# Patient Record
Sex: Female | Born: 1935 | Race: White | Hispanic: No | Marital: Married | Smoking: Former smoker
Health system: Southern US, Community
[De-identification: ages and names within clinical notes are randomized; demographics above are authoritative.]

## PROBLEM LIST (undated history)

## (undated) DIAGNOSIS — M1990 Unspecified osteoarthritis, unspecified site: Secondary | ICD-10-CM

## (undated) DIAGNOSIS — I5032 Chronic diastolic (congestive) heart failure: Secondary | ICD-10-CM

## (undated) DIAGNOSIS — I639 Cerebral infarction, unspecified: Secondary | ICD-10-CM

## (undated) DIAGNOSIS — Z9989 Dependence on other enabling machines and devices: Secondary | ICD-10-CM

## (undated) DIAGNOSIS — E785 Hyperlipidemia, unspecified: Secondary | ICD-10-CM

## (undated) DIAGNOSIS — M797 Fibromyalgia: Secondary | ICD-10-CM

## (undated) DIAGNOSIS — K219 Gastro-esophageal reflux disease without esophagitis: Secondary | ICD-10-CM

## (undated) DIAGNOSIS — T4145XA Adverse effect of unspecified anesthetic, initial encounter: Secondary | ICD-10-CM

## (undated) DIAGNOSIS — I209 Angina pectoris, unspecified: Secondary | ICD-10-CM

## (undated) DIAGNOSIS — K5790 Diverticulosis of intestine, part unspecified, without perforation or abscess without bleeding: Secondary | ICD-10-CM

## (undated) DIAGNOSIS — N3941 Urge incontinence: Secondary | ICD-10-CM

## (undated) DIAGNOSIS — G4733 Obstructive sleep apnea (adult) (pediatric): Secondary | ICD-10-CM

## (undated) DIAGNOSIS — I350 Nonrheumatic aortic (valve) stenosis: Secondary | ICD-10-CM

## (undated) DIAGNOSIS — Z853 Personal history of malignant neoplasm of breast: Secondary | ICD-10-CM

## (undated) DIAGNOSIS — I499 Cardiac arrhythmia, unspecified: Secondary | ICD-10-CM

## (undated) DIAGNOSIS — Z8673 Personal history of transient ischemic attack (TIA), and cerebral infarction without residual deficits: Secondary | ICD-10-CM

## (undated) DIAGNOSIS — T8859XA Other complications of anesthesia, initial encounter: Secondary | ICD-10-CM

## (undated) DIAGNOSIS — C801 Malignant (primary) neoplasm, unspecified: Secondary | ICD-10-CM

## (undated) DIAGNOSIS — H9190 Unspecified hearing loss, unspecified ear: Secondary | ICD-10-CM

## (undated) DIAGNOSIS — I4821 Permanent atrial fibrillation: Secondary | ICD-10-CM

## (undated) DIAGNOSIS — I2510 Atherosclerotic heart disease of native coronary artery without angina pectoris: Secondary | ICD-10-CM

## (undated) DIAGNOSIS — R351 Nocturia: Secondary | ICD-10-CM

## (undated) DIAGNOSIS — I10 Essential (primary) hypertension: Secondary | ICD-10-CM

## (undated) DIAGNOSIS — Z973 Presence of spectacles and contact lenses: Secondary | ICD-10-CM

## (undated) DIAGNOSIS — I272 Pulmonary hypertension, unspecified: Secondary | ICD-10-CM

## (undated) HISTORY — DX: Pulmonary hypertension, unspecified: I27.20

## (undated) HISTORY — PX: MASTECTOMY: SHX3

## (undated) HISTORY — DX: Unspecified osteoarthritis, unspecified site: M19.90

## (undated) HISTORY — DX: Nonrheumatic aortic (valve) stenosis: I35.0

## (undated) HISTORY — DX: Permanent atrial fibrillation: I48.21

---

## 1963-10-19 HISTORY — PX: APPENDECTOMY: SHX54

## 1971-10-19 HISTORY — PX: TUBAL LIGATION: SHX77

## 1985-10-18 HISTORY — PX: ABDOMINOPLASTY: SUR9

## 2002-10-18 HISTORY — PX: BREAST SURGERY: SHX581

## 2003-08-02 ENCOUNTER — Encounter: Admission: RE | Admit: 2003-08-02 | Discharge: 2003-08-02 | Payer: Self-pay | Admitting: Family Medicine

## 2003-08-02 ENCOUNTER — Encounter: Payer: Self-pay | Admitting: Family Medicine

## 2003-08-02 ENCOUNTER — Encounter (INDEPENDENT_AMBULATORY_CARE_PROVIDER_SITE_OTHER): Payer: Self-pay | Admitting: Specialist

## 2003-08-02 ENCOUNTER — Encounter (INDEPENDENT_AMBULATORY_CARE_PROVIDER_SITE_OTHER): Payer: Self-pay | Admitting: Family Medicine

## 2003-08-07 ENCOUNTER — Ambulatory Visit (HOSPITAL_COMMUNITY): Admission: RE | Admit: 2003-08-07 | Discharge: 2003-08-07 | Payer: Self-pay | Admitting: Family Medicine

## 2003-08-07 ENCOUNTER — Encounter: Payer: Self-pay | Admitting: Family Medicine

## 2003-08-19 ENCOUNTER — Encounter (HOSPITAL_COMMUNITY): Admission: RE | Admit: 2003-08-19 | Discharge: 2003-11-17 | Payer: Self-pay | Admitting: Surgery

## 2003-09-13 ENCOUNTER — Encounter: Admission: RE | Admit: 2003-09-13 | Discharge: 2003-09-13 | Payer: Self-pay | Admitting: Surgery

## 2003-09-16 ENCOUNTER — Encounter (INDEPENDENT_AMBULATORY_CARE_PROVIDER_SITE_OTHER): Payer: Self-pay | Admitting: *Deleted

## 2003-09-16 ENCOUNTER — Ambulatory Visit (HOSPITAL_BASED_OUTPATIENT_CLINIC_OR_DEPARTMENT_OTHER): Admission: RE | Admit: 2003-09-16 | Discharge: 2003-09-16 | Payer: Self-pay | Admitting: Surgery

## 2003-09-16 ENCOUNTER — Ambulatory Visit (HOSPITAL_COMMUNITY): Admission: RE | Admit: 2003-09-16 | Discharge: 2003-09-16 | Payer: Self-pay | Admitting: Surgery

## 2003-09-16 HISTORY — PX: MASTECTOMY, MODIFIED RADICAL W/RECONSTRUCTION: SHX708

## 2003-11-22 ENCOUNTER — Ambulatory Visit (HOSPITAL_COMMUNITY): Admission: RE | Admit: 2003-11-22 | Discharge: 2003-11-22 | Payer: Self-pay | Admitting: Family Medicine

## 2003-11-26 ENCOUNTER — Ambulatory Visit (HOSPITAL_COMMUNITY): Admission: RE | Admit: 2003-11-26 | Discharge: 2003-11-26 | Payer: Self-pay | Admitting: Oncology

## 2004-03-13 ENCOUNTER — Ambulatory Visit (HOSPITAL_COMMUNITY): Admission: RE | Admit: 2004-03-13 | Discharge: 2004-03-13 | Payer: Self-pay | Admitting: Family Medicine

## 2004-07-08 ENCOUNTER — Ambulatory Visit (HOSPITAL_BASED_OUTPATIENT_CLINIC_OR_DEPARTMENT_OTHER): Admission: RE | Admit: 2004-07-08 | Discharge: 2004-07-08 | Payer: Self-pay | Admitting: Orthopedic Surgery

## 2004-07-08 ENCOUNTER — Ambulatory Visit (HOSPITAL_COMMUNITY): Admission: RE | Admit: 2004-07-08 | Discharge: 2004-07-08 | Payer: Self-pay | Admitting: Orthopedic Surgery

## 2004-07-08 HISTORY — PX: CARPAL TUNNEL RELEASE: SHX101

## 2004-08-12 ENCOUNTER — Ambulatory Visit (HOSPITAL_COMMUNITY): Admission: RE | Admit: 2004-08-12 | Discharge: 2004-08-12 | Payer: Self-pay | Admitting: Oncology

## 2004-08-31 ENCOUNTER — Ambulatory Visit: Payer: Self-pay | Admitting: Oncology

## 2004-10-18 HISTORY — PX: COLONOSCOPY WITH ESOPHAGOGASTRODUODENOSCOPY (EGD): SHX5779

## 2004-11-11 ENCOUNTER — Ambulatory Visit (HOSPITAL_BASED_OUTPATIENT_CLINIC_OR_DEPARTMENT_OTHER): Admission: RE | Admit: 2004-11-11 | Discharge: 2004-11-11 | Payer: Self-pay | Admitting: Plastic Surgery

## 2004-11-11 HISTORY — PX: OTHER SURGICAL HISTORY: SHX169

## 2004-12-03 ENCOUNTER — Ambulatory Visit (HOSPITAL_COMMUNITY): Admission: RE | Admit: 2004-12-03 | Discharge: 2004-12-03 | Payer: Self-pay | Admitting: Plastic Surgery

## 2004-12-03 ENCOUNTER — Ambulatory Visit (HOSPITAL_BASED_OUTPATIENT_CLINIC_OR_DEPARTMENT_OTHER): Admission: RE | Admit: 2004-12-03 | Discharge: 2004-12-03 | Payer: Self-pay | Admitting: Plastic Surgery

## 2004-12-25 ENCOUNTER — Ambulatory Visit: Payer: Self-pay | Admitting: Oncology

## 2005-01-05 ENCOUNTER — Ambulatory Visit (HOSPITAL_COMMUNITY): Admission: RE | Admit: 2005-01-05 | Discharge: 2005-01-05 | Payer: Self-pay | Admitting: Gastroenterology

## 2005-03-16 ENCOUNTER — Ambulatory Visit (HOSPITAL_COMMUNITY): Admission: RE | Admit: 2005-03-16 | Discharge: 2005-03-16 | Payer: Self-pay | Admitting: Plastic Surgery

## 2005-04-21 ENCOUNTER — Ambulatory Visit: Payer: Self-pay | Admitting: Oncology

## 2005-08-27 ENCOUNTER — Ambulatory Visit (HOSPITAL_COMMUNITY): Admission: RE | Admit: 2005-08-27 | Discharge: 2005-08-27 | Payer: Self-pay | Admitting: Oncology

## 2005-09-10 ENCOUNTER — Ambulatory Visit: Payer: Self-pay | Admitting: Oncology

## 2005-09-13 ENCOUNTER — Ambulatory Visit (HOSPITAL_COMMUNITY): Admission: RE | Admit: 2005-09-13 | Discharge: 2005-09-13 | Payer: Self-pay | Admitting: Oncology

## 2005-09-15 ENCOUNTER — Ambulatory Visit (HOSPITAL_COMMUNITY): Admission: RE | Admit: 2005-09-15 | Discharge: 2005-09-15 | Payer: Self-pay | Admitting: Plastic Surgery

## 2005-09-15 ENCOUNTER — Encounter (INDEPENDENT_AMBULATORY_CARE_PROVIDER_SITE_OTHER): Payer: Self-pay | Admitting: Specialist

## 2005-09-15 HISTORY — PX: OTHER SURGICAL HISTORY: SHX169

## 2006-04-07 ENCOUNTER — Ambulatory Visit: Payer: Self-pay | Admitting: Oncology

## 2006-04-11 LAB — CBC WITH DIFFERENTIAL/PLATELET
BASO%: 0.5 % (ref 0.0–2.0)
Eosinophils Absolute: 0.3 10*3/uL (ref 0.0–0.5)
HCT: 38.3 % (ref 34.8–46.6)
HGB: 13.5 g/dL (ref 11.6–15.9)
LYMPH%: 31.8 % (ref 14.0–48.0)
MCHC: 35.2 g/dL (ref 32.0–36.0)
MONO#: 0.5 10*3/uL (ref 0.1–0.9)
NEUT#: 2.7 10*3/uL (ref 1.5–6.5)
NEUT%: 52.4 % (ref 39.6–76.8)
Platelets: 222 10*3/uL (ref 145–400)
WBC: 5.1 10*3/uL (ref 3.9–10.0)
lymph#: 1.6 10*3/uL (ref 0.9–3.3)

## 2006-04-11 LAB — COMPREHENSIVE METABOLIC PANEL
ALT: 23 U/L (ref 0–40)
CO2: 28 mEq/L (ref 19–32)
Calcium: 9.3 mg/dL (ref 8.4–10.5)
Chloride: 106 mEq/L (ref 96–112)
Creatinine, Ser: 1.09 mg/dL (ref 0.40–1.20)
Glucose, Bld: 95 mg/dL (ref 70–99)
Sodium: 142 mEq/L (ref 135–145)
Total Bilirubin: 0.5 mg/dL (ref 0.3–1.2)
Total Protein: 7 g/dL (ref 6.0–8.3)

## 2006-04-11 LAB — CANCER ANTIGEN 27.29: CA 27.29: 52 U/mL — ABNORMAL HIGH (ref 0–39)

## 2006-08-05 ENCOUNTER — Ambulatory Visit (HOSPITAL_COMMUNITY): Admission: RE | Admit: 2006-08-05 | Discharge: 2006-08-05 | Payer: Self-pay | Admitting: Plastic Surgery

## 2006-08-05 ENCOUNTER — Encounter (INDEPENDENT_AMBULATORY_CARE_PROVIDER_SITE_OTHER): Payer: Self-pay | Admitting: Specialist

## 2006-08-05 HISTORY — PX: OTHER SURGICAL HISTORY: SHX169

## 2006-08-29 ENCOUNTER — Ambulatory Visit (HOSPITAL_COMMUNITY): Admission: RE | Admit: 2006-08-29 | Discharge: 2006-08-29 | Payer: Self-pay | Admitting: Family Medicine

## 2006-09-02 ENCOUNTER — Ambulatory Visit (HOSPITAL_COMMUNITY): Admission: RE | Admit: 2006-09-02 | Discharge: 2006-09-02 | Payer: Self-pay | Admitting: Family Medicine

## 2006-09-02 ENCOUNTER — Encounter: Admission: RE | Admit: 2006-09-02 | Discharge: 2006-09-02 | Payer: Self-pay | Admitting: Plastic Surgery

## 2006-10-24 ENCOUNTER — Ambulatory Visit: Payer: Self-pay | Admitting: Oncology

## 2006-10-26 LAB — COMPREHENSIVE METABOLIC PANEL
ALT: 23 U/L (ref 0–35)
AST: 18 U/L (ref 0–37)
Albumin: 4.2 g/dL (ref 3.5–5.2)
CO2: 26 mEq/L (ref 19–32)
Calcium: 9.7 mg/dL (ref 8.4–10.5)
Chloride: 104 mEq/L (ref 96–112)
Potassium: 3.9 mEq/L (ref 3.5–5.3)
Sodium: 138 mEq/L (ref 135–145)
Total Protein: 7.6 g/dL (ref 6.0–8.3)

## 2006-10-26 LAB — CBC WITH DIFFERENTIAL/PLATELET
BASO%: 0.5 % (ref 0.0–2.0)
EOS%: 4.4 % (ref 0.0–7.0)
HCT: 38.8 % (ref 34.8–46.6)
MCH: 30.5 pg (ref 26.0–34.0)
MCHC: 34 g/dL (ref 32.0–36.0)
MONO#: 0.6 10*3/uL (ref 0.1–0.9)
RDW: 13.7 % (ref 11.3–14.5)
WBC: 7.9 10*3/uL (ref 3.9–10.0)
lymph#: 1.5 10*3/uL (ref 0.9–3.3)

## 2007-03-05 ENCOUNTER — Encounter: Admission: RE | Admit: 2007-03-05 | Discharge: 2007-03-05 | Payer: Self-pay | Admitting: Oncology

## 2007-04-18 ENCOUNTER — Ambulatory Visit: Payer: Self-pay | Admitting: Oncology

## 2007-04-20 LAB — COMPREHENSIVE METABOLIC PANEL
Alkaline Phosphatase: 85 U/L (ref 39–117)
CO2: 27 mEq/L (ref 19–32)
Creatinine, Ser: 0.97 mg/dL (ref 0.40–1.20)
Glucose, Bld: 98 mg/dL (ref 70–99)
Total Bilirubin: 0.5 mg/dL (ref 0.3–1.2)

## 2007-04-20 LAB — CBC WITH DIFFERENTIAL/PLATELET
BASO%: 0.6 % (ref 0.0–2.0)
Eosinophils Absolute: 0.5 10*3/uL (ref 0.0–0.5)
HCT: 39.9 % (ref 34.8–46.6)
LYMPH%: 33.9 % (ref 14.0–48.0)
MCHC: 35.7 g/dL (ref 32.0–36.0)
MCV: 89.4 fL (ref 81.0–101.0)
MONO#: 0.6 10*3/uL (ref 0.1–0.9)
MONO%: 9.4 % (ref 0.0–13.0)
NEUT%: 47.3 % (ref 39.6–76.8)
Platelets: 222 10*3/uL (ref 145–400)
RBC: 4.46 10*6/uL (ref 3.70–5.32)
WBC: 5.9 10*3/uL (ref 3.9–10.0)

## 2007-04-20 LAB — LACTATE DEHYDROGENASE: LDH: 191 U/L (ref 94–250)

## 2007-04-20 LAB — CANCER ANTIGEN 27.29: CA 27.29: 35 U/mL (ref 0–39)

## 2007-11-01 ENCOUNTER — Ambulatory Visit: Payer: Self-pay | Admitting: Oncology

## 2007-11-06 LAB — CBC WITH DIFFERENTIAL/PLATELET
Basophils Absolute: 0 10*3/uL (ref 0.0–0.1)
Eosinophils Absolute: 0.3 10*3/uL (ref 0.0–0.5)
HCT: 39.8 % (ref 34.8–46.6)
HGB: 14.3 g/dL (ref 11.6–15.9)
LYMPH%: 32 % (ref 14.0–48.0)
MONO#: 0.4 10*3/uL (ref 0.1–0.9)
NEUT#: 3.3 10*3/uL (ref 1.5–6.5)
NEUT%: 55.4 % (ref 39.6–76.8)
Platelets: 224 10*3/uL (ref 145–400)
RBC: 4.29 10*6/uL (ref 3.70–5.32)
WBC: 6 10*3/uL (ref 3.9–10.0)

## 2007-11-06 LAB — CANCER ANTIGEN 27.29: CA 27.29: 41 U/mL — ABNORMAL HIGH (ref 0–39)

## 2007-11-06 LAB — COMPREHENSIVE METABOLIC PANEL
BUN: 30 mg/dL — ABNORMAL HIGH (ref 6–23)
CO2: 23 mEq/L (ref 19–32)
Glucose, Bld: 109 mg/dL — ABNORMAL HIGH (ref 70–99)
Sodium: 140 mEq/L (ref 135–145)
Total Bilirubin: 0.6 mg/dL (ref 0.3–1.2)
Total Protein: 7.4 g/dL (ref 6.0–8.3)

## 2007-11-06 LAB — LACTATE DEHYDROGENASE: LDH: 167 U/L (ref 94–250)

## 2008-02-20 ENCOUNTER — Ambulatory Visit (HOSPITAL_COMMUNITY): Admission: RE | Admit: 2008-02-20 | Discharge: 2008-02-20 | Payer: Self-pay | Admitting: Oncology

## 2008-07-01 ENCOUNTER — Ambulatory Visit: Payer: Self-pay | Admitting: Oncology

## 2008-07-03 LAB — CBC WITH DIFFERENTIAL/PLATELET
BASO%: 0.6 % (ref 0.0–2.0)
EOS%: 2.5 % (ref 0.0–7.0)
HCT: 36.5 % (ref 34.8–46.6)
MCHC: 35.8 g/dL (ref 32.0–36.0)
MONO#: 0.4 10*3/uL (ref 0.1–0.9)
NEUT%: 61.8 % (ref 39.6–76.8)
RDW: 12.3 % (ref 11.3–14.5)
WBC: 6.8 10*3/uL (ref 3.9–10.0)
lymph#: 1.9 10*3/uL (ref 0.9–3.3)

## 2008-07-04 LAB — COMPREHENSIVE METABOLIC PANEL
ALT: 33 U/L (ref 0–35)
AST: 26 U/L (ref 0–37)
Albumin: 4.2 g/dL (ref 3.5–5.2)
CO2: 24 mEq/L (ref 19–32)
Calcium: 9.9 mg/dL (ref 8.4–10.5)
Chloride: 101 mEq/L (ref 96–112)
Creatinine, Ser: 1.36 mg/dL — ABNORMAL HIGH (ref 0.40–1.20)
Potassium: 3.9 mEq/L (ref 3.5–5.3)
Sodium: 140 mEq/L (ref 135–145)
Total Protein: 7.4 g/dL (ref 6.0–8.3)

## 2008-07-04 LAB — LACTATE DEHYDROGENASE: LDH: 182 U/L (ref 94–250)

## 2008-07-28 LAB — ESTRADIOL, ULTRA SENS

## 2008-10-01 ENCOUNTER — Ambulatory Visit (HOSPITAL_COMMUNITY): Admission: RE | Admit: 2008-10-01 | Discharge: 2008-10-01 | Payer: Self-pay | Admitting: Oncology

## 2009-02-04 ENCOUNTER — Ambulatory Visit: Payer: Self-pay | Admitting: Oncology

## 2009-02-11 LAB — CBC WITH DIFFERENTIAL/PLATELET
Basophils Absolute: 0 10*3/uL (ref 0.0–0.1)
EOS%: 5.3 % (ref 0.0–7.0)
HCT: 38.8 % (ref 34.8–46.6)
HGB: 13.5 g/dL (ref 11.6–15.9)
MCH: 33 pg (ref 25.1–34.0)
MCV: 94.6 fL (ref 79.5–101.0)
MONO%: 6.4 % (ref 0.0–14.0)
NEUT%: 64.1 % (ref 38.4–76.8)

## 2009-02-12 LAB — COMPREHENSIVE METABOLIC PANEL
AST: 17 U/L (ref 0–37)
Alkaline Phosphatase: 57 U/L (ref 39–117)
BUN: 31 mg/dL — ABNORMAL HIGH (ref 6–23)
Calcium: 9.7 mg/dL (ref 8.4–10.5)
Chloride: 105 mEq/L (ref 96–112)
Creatinine, Ser: 1.13 mg/dL (ref 0.40–1.20)
Glucose, Bld: 95 mg/dL (ref 70–99)

## 2009-02-12 LAB — VITAMIN D 25 HYDROXY (VIT D DEFICIENCY, FRACTURES): Vit D, 25-Hydroxy: 48 ng/mL (ref 30–89)

## 2009-02-12 LAB — LACTATE DEHYDROGENASE: LDH: 165 U/L (ref 94–250)

## 2009-02-21 ENCOUNTER — Ambulatory Visit (HOSPITAL_COMMUNITY): Admission: RE | Admit: 2009-02-21 | Discharge: 2009-02-21 | Payer: Self-pay | Admitting: Oncology

## 2010-02-11 ENCOUNTER — Ambulatory Visit: Payer: Self-pay | Admitting: Oncology

## 2010-02-12 LAB — CBC WITH DIFFERENTIAL/PLATELET
BASO%: 0.4 % (ref 0.0–2.0)
Basophils Absolute: 0 10*3/uL (ref 0.0–0.1)
EOS%: 5.1 % (ref 0.0–7.0)
Eosinophils Absolute: 0.2 10*3/uL (ref 0.0–0.5)
HCT: 42.6 % (ref 34.8–46.6)
HGB: 14.9 g/dL (ref 11.6–15.9)
LYMPH%: 35.2 % (ref 14.0–49.7)
MCH: 33.2 pg (ref 25.1–34.0)
MCHC: 35 g/dL (ref 31.5–36.0)
MCV: 95 fL (ref 79.5–101.0)
MONO#: 0.4 10*3/uL (ref 0.1–0.9)
MONO%: 8.6 % (ref 0.0–14.0)
NEUT#: 2.2 10*3/uL (ref 1.5–6.5)
NEUT%: 50.7 % (ref 38.4–76.8)
Platelets: 210 10*3/uL (ref 145–400)
RBC: 4.49 10*6/uL (ref 3.70–5.45)
RDW: 12.9 % (ref 11.2–14.5)
WBC: 4.4 10*3/uL (ref 3.9–10.3)
lymph#: 1.6 10*3/uL (ref 0.9–3.3)

## 2010-02-13 LAB — COMPREHENSIVE METABOLIC PANEL
ALT: 18 U/L (ref 0–35)
AST: 19 U/L (ref 0–37)
Albumin: 4.4 g/dL (ref 3.5–5.2)
Alkaline Phosphatase: 77 U/L (ref 39–117)
BUN: 34 mg/dL — ABNORMAL HIGH (ref 6–23)
CO2: 26 mEq/L (ref 19–32)
Calcium: 9.4 mg/dL (ref 8.4–10.5)
Chloride: 101 mEq/L (ref 96–112)
Creatinine, Ser: 1.12 mg/dL (ref 0.40–1.20)
Glucose, Bld: 106 mg/dL — ABNORMAL HIGH (ref 70–99)
Potassium: 4.3 mEq/L (ref 3.5–5.3)
Sodium: 141 mEq/L (ref 135–145)
Total Bilirubin: 0.7 mg/dL (ref 0.3–1.2)
Total Protein: 7.3 g/dL (ref 6.0–8.3)

## 2010-02-13 LAB — CANCER ANTIGEN 27.29: CA 27.29: 43 U/mL — ABNORMAL HIGH (ref 0–39)

## 2010-02-13 LAB — LACTATE DEHYDROGENASE: LDH: 154 U/L (ref 94–250)

## 2010-02-13 LAB — VITAMIN D 25 HYDROXY (VIT D DEFICIENCY, FRACTURES): Vit D, 25-Hydroxy: 32 ng/mL (ref 30–89)

## 2010-02-23 ENCOUNTER — Ambulatory Visit (HOSPITAL_COMMUNITY): Admission: RE | Admit: 2010-02-23 | Discharge: 2010-02-23 | Payer: Self-pay | Admitting: Oncology

## 2010-11-07 ENCOUNTER — Encounter: Payer: Self-pay | Admitting: Oncology

## 2010-11-08 ENCOUNTER — Encounter: Payer: Self-pay | Admitting: Oncology

## 2010-11-25 ENCOUNTER — Other Ambulatory Visit: Payer: Self-pay | Admitting: Family Medicine

## 2010-11-25 ENCOUNTER — Ambulatory Visit
Admission: RE | Admit: 2010-11-25 | Discharge: 2010-11-25 | Disposition: A | Discharge status: Home / Self Care | Payer: BC Managed Care – PPO | Source: Ambulatory Visit | Attending: Family Medicine | Admitting: Family Medicine

## 2010-11-25 DIAGNOSIS — IMO0002 Reserved for concepts with insufficient information to code with codable children: Secondary | ICD-10-CM

## 2011-02-10 ENCOUNTER — Other Ambulatory Visit (HOSPITAL_COMMUNITY): Payer: Self-pay | Admitting: Family Medicine

## 2011-02-10 DIAGNOSIS — Z1231 Encounter for screening mammogram for malignant neoplasm of breast: Secondary | ICD-10-CM

## 2011-02-10 DIAGNOSIS — Z853 Personal history of malignant neoplasm of breast: Secondary | ICD-10-CM

## 2011-02-26 ENCOUNTER — Ambulatory Visit (HOSPITAL_COMMUNITY)
Admission: RE | Admit: 2011-02-26 | Discharge: 2011-02-26 | Disposition: A | Discharge status: Home / Self Care | Payer: BC Managed Care – PPO | Source: Ambulatory Visit | Attending: Family Medicine | Admitting: Family Medicine

## 2011-02-26 DIAGNOSIS — Z853 Personal history of malignant neoplasm of breast: Secondary | ICD-10-CM

## 2011-02-26 DIAGNOSIS — Z1231 Encounter for screening mammogram for malignant neoplasm of breast: Secondary | ICD-10-CM | POA: Insufficient documentation

## 2011-03-05 NOTE — Op Note (Signed)
NAME:  [name], [name]                      ACCOUNT NO.:  [id number]   MEDICAL RECORD NO.:  [id number]                   PATIENT TYPE:  AMB   LOCATION:  DSC                                  FACILITY:  [facility]   PHYSICIAN:  Etter Sjogren, M.D.                  DATE OF BIRTH:  1936-08-09   DATE OF PROCEDURE:  09/16/2003  DATE OF DISCHARGE:                                 OPERATIVE REPORT   PREOPERATIVE DIAGNOSIS:  Left breast cancer.   POSTOPERATIVE DIAGNOSIS:  Left breast cancer.   PROCEDURE:  Left breast reconstruction immediate with a tissue expander.   SURGEON:  Etter Sjogren, M.D.   ANESTHESIA:  General.   ESTIMATED BLOOD LOSS:  Minimal.   DRAINS:  2 Blakes.   INDICATIONS FOR PROCEDURE:  The patient is a 75 year old woman who has left  breast cancer. She is to undergo a mastectomy  and a sentinel lymph node  biopsy. She was interested in reconstruction and the option was discussed  and she elected to have a postoperative tissue expander followed  eventually  by an implant. The nature of the procedure and the risks were discussed  with her in great detail and she understood the risks and possible  complications and wished to proceed.   DESCRIPTION OF PROCEDURE:  The patient was in the operating room and the  mastectomy had been completed. The sentinel lymph node was negative. She  tolerated  that well.   The dissection was carried deep to the pectoralis major muscle using a  muscle splitting incision. The submuscular pocket well developed for the  placement of the implant done inferiorly to the level of the inframammary  crease. There was thorough irrigation with antibiotic solution after first  assuring excellent hemostasis using electrocautery.   The implant was then prepared. The operating surgeon thoroughly cleaned the  gloves. This was a Engineer, petroleum medium height contour profile tissue  expander lot #[id number] 150 mL. The air was removed and 50 mL of sterile  saline was placed using the closed system. All the air was again removed and  the expander was positioned in the submuscular pocket, making sure that it  was seated at the very inferior aspect of the pocket.   Again irrigation with antibiotic solution. Good hemostasis was again  confirmed. The muscle layer was closed with 3-0 Vicryl interrupted figure-of-  8 sutures. Blake drains were positioned and brought out through separate  stab wounds inferiorly and secured with 3-0 Prolene suture. One of these was  left along the lateral aspect and into the superior  and one inferiorly.   The same closure, 3-0 Monocryl interrupted for the deep sutures and running  subcuticular suture. Steri-Strips and a dry sterile dressing with a light  circumferential  Ace wrap were applied. She was transferred to the recovery  room in stable condition having tolerated  the procedure well.  Etter Sjogren, M.D.    DB/MEDQ  D:  09/16/2003  T:  09/16/2003  Job:  [id number]

## 2011-03-05 NOTE — Op Note (Signed)
NAME:  [name], [name] NO.:  [id number]   MEDICAL RECORD NO.:  [id number]          PATIENT TYPE:  AMB   LOCATION:  SDS                          FACILITY:  [facility]   PHYSICIAN:  Etter Sjogren, M.D.     DATE OF BIRTH:  [birth year]   DATE OF PROCEDURE:  08/05/2006  DATE OF DISCHARGE:                                 OPERATIVE REPORT   PREOPERATIVE DIAGNOSIS:  Chest cellulitis, status post breast  reconstruction; possible infection of tissue expander with capsule  contracture.   POSTOPERATIVE DIAGNOSIS:  Chest cellulitis, status post breast  reconstruction; possible infection of tissue expander with capsule  contracture.   PROCEDURE PERFORMED:  1. Removal of tissue expander.  2. Total capsulectomy.  3. Replacement of tissue expander.   SURGEON:  Etter Sjogren, M.D.   ASSISTANT:  Cox, RNFA.   ESTIMATED BLOOD LOSS:  100 mL.   DRAINS:  One 19-French Harrison Mons drain was left.   CLINICAL NOTE:  This 75 year old woman has had a breast reconstruction.  She  has had problems with infection in the past.  Her reconstruction was  repeated about six weeks ago, and she has subsequently developed cellulitis.  It was felt that she needs a total capsulectomy and there is a chance that  this reconstruction could be salvaged.  We discussed that at length with  her.  She understands that it might fail, and she might have a repeat  infection, but that this is a very indolent-appearing process and with a  normal white blood cell count, and we might be able to salvage it with a  total capsulectomy, thorough irrigation, and replacement of the tissue  expander.  She wishes to proceed.  Other risks and possible complications  are well-known to her, including but not limited to, bleeding, infection,  surgery complications, healing problems, capsular contracture, scarring.   DESCRIPTION OF PROCEDURE:  The patient was taken to the operating room and  placed supine.  After successful induction  of general anesthesia, she was  prepped with Betadine and draped with sterile drapes.  The old mastectomy  scar was excised and the dissection carried down through the underlying  muscle down to the tissue expander.  Aerobic and anaerobic cultures were  taken.  The tissue expander was removed.  A total capsulectomy was  performed, taking great care to avoid damage to overlying pectoralis major  muscle and the underlying chest cavity.  After the total capsulectomy,  hemostasis was obtained with electrocautery.  Thorough irrigation with  saline, a total of 4 liters, 3 liters of which were irrigated with the pulse  lavage system.  A 19-French Blake drain was positioned and brought out  through a separate stab wound inferolaterally and secured with 3-0 plain  suture.  The tissue expander was soaked in antibiotic solution for greater  than 5 minutes.  This was a [employer] tissue expander, lot [id number], a 650 mL  tissue expander and 85 mL sterile saline placed using a closed filling  system, and all of the air was removed.  The pocket appeared to have  excellent hemostasis.  The tissue expander  was positioned, antibiotic  solution placed in the submuscular pocket and allowed to dwell.  The closure  with 2-0 Monocryl interrupted horizontal mattress sutures and the skin with  3-0 nylon interrupted vertical mattress sutures.  Xeroform gauze and dry,  sterile dressing and Steri-Strips were applied, and she was transferred to  the recovery room in stable condition, having tolerated the procedure well.   DISPOSITION:  She will remain on antibiotics, and we will see her back in  the office in a few days.      Etter Sjogren, M.D.  Electronically Signed     DB/MEDQ  D:  08/05/2006  T:  08/06/2006  Job:  [id number]

## 2011-03-05 NOTE — Op Note (Signed)
NAME:  [name], [name] NO.:  [id number]   MEDICAL RECORD NO.:  [id number]          PATIENT TYPE:  OIB   LOCATION:  2855                         FACILITY:  [facility]   PHYSICIAN:  Etter Sjogren, M.D.     DATE OF BIRTH:  March 25, 1936   DATE OF PROCEDURE:  03/16/2005  DATE OF DISCHARGE:  03/16/2005                                 OPERATIVE REPORT   PREOPERATIVE DIAGNOSIS:  Seroma of the left breast with displacement of  implant inferiorly, status post mastectomy for breast cancer.   POSTOPERATIVE DIAGNOSIS:  Seroma of the left breast with displacement of  implant inferiorly, status post mastectomy for breast cancer.   PROCEDURE PERFORMED:  Revision of breast reconstruction for breast cancer  (drainage of seroma, inferior capsulorrhaphy, superior capsulotomy and  exchange of an implant.   SURGEON:  Etter Sjogren, M.D.   ANESTHESIA:  General.   ESTIMATED BLOOD LOSS:  Minimal.   DRAINS:  One Blake left.   CLINICAL NOTE:  This is a 75 year old woman who has had breast  reconstruction.  She has had a previous seroma around this implant and had  that drained and the implant replaced.  She did well for a number months,  but now has reaccumulated a seroma.  The capsule was also opened inferiorly  as well, contributing to the collection of the seroma.  The nature of the  procedure and risks were discussed with her.  She understood we would try to  make an attempt to do a capsulorrhaphy if it was possible, but certainly  needed to drain this seroma.  She wished to proceed.   DESCRIPTION OF PROCEDURE:  The patient was marked in a full upright position  in the holding area for this surgery including the capsulorrhaphy and the  level at which the capsulorrhaphy would be performed.  She was then taken to  the operating room and placed supine.  After successful induction of general  anesthesia, she was prepped with Betadine and draped with sterile drapes.  The old mastectomy scar  was opened at its central aspect and the dissection  carried down through the underlying muscle using electrocautery.  The  underlying implant was identified.  Cultures were taken.  The fluid did not  appear very cloudy.  It appeared to be just a typical seroma.  The implant  was removed.  The capsule was excised inferiorly as it had been marked  preoperatively; great care being taken to avoid damage to the underlying  chest cavity.  Hemostasis was achieved with the electrocautery.  The wound  was irrigated thoroughly with saline.  The capsule was opened a little bit  further medially, the capsulorrhaphy with 0 PDS simple running suture in 4  separate strands at 4 separate locations inferiorly.  A Blake drain was  positioned and brought out through a separate stab wound inferolaterally and  secured with a 3-0 Prolene suture.   The capsule was then opened superiorly in order to allow space for the  implant.  This was performed with the electrocautery, meticulous hemostasis  with the electrocautery.  The entire pocket was then  irrigated with  antibiotic solution.  The implant was prepared.  After thoroughly changing  gloves, the implant was prepared by placing it in antibiotic solution.  This  was a McGhan 363LF saline anatomical implant, lot #[id number].  One hundred  milliliters were placed using a closed fill system.  All the air was  removed.  The pocket was inspected and excellent hemostasis was confirmed.  Sutures of 3-0 Vicryl were preplaced and left untied.  The implant was  positioned in the pocket.  It was filled to 690 mL with the closed fill  system.  The Vicryl sutures were tied securely, the remainder of the closure  with 3-0 Monocryl interrupted inverted deep sutures, taking great care to  avoid damage to the underlying implant and 3-0 Monocryl running subcuticular  suture.  Steri-Strips, dry sterile dressing and a circumferential Ace wrap  applied, wrapping 1 Ace wrap below the  implant in order to support the  capsulorrhaphy and another Ace wrap around the chest/breasts.  She tolerated  it well.   DISPOSITION:  She will be discharged and rechecked in the office in 2 days.       DB/MEDQ  D:  03/16/2005  T:  03/17/2005  Job:  [id number]

## 2011-03-05 NOTE — Op Note (Signed)
NAME:  [name], [name] NO.:  [id number]   MEDICAL RECORD NO.:  [id number]          PATIENT TYPE:  AMB   LOCATION:  SDS                          FACILITY:  [facility]   PHYSICIAN:  Etter Sjogren, M.D.     DATE OF BIRTH:  26-Jun-1936   DATE OF PROCEDURE:  09/15/2005  DATE OF DISCHARGE:  09/15/2005                                 OPERATIVE REPORT   PREOPERATIVE DIAGNOSES:  1.  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  2.  Breast cancer.   POSTOPERATIVE DIAGNOSES:  1.  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  2.  Breast cancer.   PROCEDURES PERFORMED:  1.  Explantation.  2.  Debridement of chest skin.   SURGEON:  Etter Sjogren, M.D.   ANESTHESIA:  MAC.   CLINICAL NOTE:  A 75 year old who has had breast cancer.  She had  reconstruction.  She had a revision at one point.  She did very well for a  number of months, then developed a seroma.  This became a chronic seroma  despite efforts at drainage and even implant exchange.  She has now  developed a wound dehiscence secondary to the chronic seroma, and it is felt  that the implant just needs to be removed.  The nature of the procedure and  the risks were discussed with her, and she understood and wished to proceed.   DESCRIPTION OF PROCEDURE:  The patient was taken to the operating room and  placed supine.  Under satisfactory sedation, she was prepped with Betadine  and draped with sterile drapes.  The elliptical skin excision was designed  and satisfactory local anesthesia achieved with 0.5% Xylocaine with  1:200,000 epinephrine plus Neut.  Skin excision was performed.  The  dissection was carried into the submuscular space, and the implant was  removed.  There was a little bit of purulence present.  This was cultured.  There was a thorough cleansing, and cleaning of the posterior wall capsule.  Thorough irrigation with saline and a Blake drain was positioned,  brought  through a separate stab wound inferolaterally, and secured with a 3-0 nylon  suture.  Good hemostasis was confirmed, and the closure with some 3-0 and 2-  0 Monocryl simple interrupted sutures for the muscle, followed by a few 2-0  Monocryl interrupted inverted deep sutures, followed by 3-0 Prolene simple  interrupted and vertical mattress sutures as  needed.  Antibiotic ointment and dry sterile dressing and circumferential  Ace wrap applied.  She was transported to the recovery room in stable  condition and tolerated the procedure well.   DISPOSITION:  Recheck in the office in 2 days.      Etter Sjogren, M.D.  Electronically Signed     DB/MEDQ  D:  09/15/2005  T:  09/16/2005  Job:  [id number]

## 2011-03-05 NOTE — Op Note (Signed)
NAME:  JEANITA, CARNEIRO NO.:  [id number]   MEDICAL RECORD NO.:  [id number]          PATIENT TYPE:  AMB   LOCATION:  ENDO                         FACILITY:  [facility]   PHYSICIAN:  Petra Kuba, M.D.    DATE OF BIRTH:  27-Nov-1935   DATE OF PROCEDURE:  01/05/2005  DATE OF DISCHARGE:                                 OPERATIVE REPORT   PROCEDURE:  Colonoscopy.   INDICATIONS:  Screening.   Consent was signed after risks, benefits, methods, and options thoroughly  discussed in the office.   MEDICINES USED:  Demerol 80, Versed 8.   PROCEDURE:  Rectal inspection is pertinent for external hemorrhoids, small.  Digital exam was negative.  The video pediatric adjustable colonoscope was  inserted and with abdominal pressure and no position changes, the scope was  advanced to the cecum.  No abnormalities were seen on insertion.  The scope  inserted shortways into the terminal ileum, which was normal.  Photo  documentation was obtained.  The scope was slowly withdrawn.  The prep was  adequate.  There was some liquid stool that required washing and suctioning,  mostly suctioning because we used pill prep.  On slow withdrawal through the  colon, no abnormalities were seen.  Specifically, no polyps, tumors, masses,  or diverticula.  Once back in the rectum, anorectal pull-through and  retroflexion confirmed some small hemorrhoids.  The scope was straightened  and readvanced shortways up the left side of the colon.  Air was suctioned.  The scope removed.  The patient tolerated the procedure well.  There was no  obvious immediate complication.   ENDOSCOPIC DIAGNOSES:  1.  Internal/external small hemorrhoids.  2.  Otherwise within normal limits to the terminal ileum.   PLAN:  Yearly rectals and guaiacs per Dr. Clyde Canterbury.  Rescreen in 5-10  years.  Continue workup with a one-time EGD for upper tract symptoms.      MEM/MEDQ  D:  01/05/2005  T:  01/05/2005  Job:  [id number]   cc:    Otilio Connors. Gerri Spore, M.D.  [address]  [place]  [place] [address]  Fax: [phone]

## 2011-03-05 NOTE — Op Note (Signed)
NAME:  [name], [name] NO.:  [id number]   MEDICAL RECORD NO.:  [id number]          PATIENT TYPE:  AMB   LOCATION:  ENDO                         FACILITY:  [facility]   PHYSICIAN:  Petra Kuba, M.D.    DATE OF BIRTH:  26-Jul-1936   DATE OF PROCEDURE:  01/05/2005  DATE OF DISCHARGE:                                 OPERATIVE REPORT   PROCEDURE:  Esophagogastroduodenoscopy.   INDICATIONS:  Upper tract symptoms, want to rule out Barrett's or  significant abnormality.   Consent was signed after risks, benefits, methods, options thoroughly  discussed in the office.   ADDITIONAL MEDICATIONS:  Versed 1 only.   PROCEDURE:  The video endoscope was inserted by direct vision.  The  esophagus was normal.  She had a tiny hiatal hernia.  No sounds of  esophagitis or Barrett's.  The scope was passed into the stomach and  advanced to the antrum, where some minimal antritis was seen and advanced  through a normal pylorus into the duodenal bulb, pertinent for some minimal  bulbitis, around the C loop to a normal second portion of the duodenum.  A  quick look at the ampulla was normal.  The scope was withdrawn back into the  bulb, which confirmed the above findings.  The scope was withdrawn back to  the stomach and retroflexed.  High in the cardia, the hiatal hernia was  confirmed.  The angularis, fundus, lesser and greater curvature were normal.  Straight visualization of the stomach did not reveal any additional  findings.  Air was suctioned, and the scope slowly withdrawn.  Again, a good  look at the esophagus again was normal.  The scope was removed.  The patient  tolerated the procedure well.  There was no obvious immediate complication.   ENDOSCOPIC DIAGNOSES:  1.  Tiny hiatal hernia.  2.  Minimal antritis, bulbitis.  3.  Otherwise normal esophagogastroduodenoscopy.   PLAN:  Pump inhibitors or H2 blockers p.r.n.  Otherwise return to the care  of Dr. Gerri Spore for the  customary health-care maintenance.     MEM/MEDQ  D:  01/05/2005  T:  01/05/2005  Job:  [id number]   cc:   Otilio Connors. Gerri Spore, M.D.  [address]  [place]  [place] [address]  Fax: [phone]

## 2011-03-05 NOTE — Op Note (Signed)
NAME:  [name], [name]                      ACCOUNT NO.:  [id number]   MEDICAL RECORD NO.:  [id number]                   PATIENT TYPE:  AMB   LOCATION:  DSC                                  FACILITY:  [facility]   PHYSICIAN:  Currie Paris, M.D.           DATE OF BIRTH:  April 05, 1936   DATE OF PROCEDURE:  09/16/2003  DATE OF DISCHARGE:                                 OPERATIVE REPORT   VISIT:  #[id number]  OFFICE [facility]:  #[id number]   PREOPERATIVE DIAGNOSIS:  Multifocal left breast cancer.   POSTOPERATIVE DIAGNOSIS:  Multifocal left breast cancer.   OPERATION:  Left total mastectomy with blue dye injection and sentinel node  biopsy (three nodes).   SURGEON:  Currie Paris, M.D.   ASSISTANT:  Abigail Miyamoto, M.D.   ANESTHESIA:  General.   INDICATIONS FOR PROCEDURE:  This patient is a 75 year old who has had a  prior reduction mammoplasty, who has presented with two simultaneously-noted  breast cancers in the left breast.  After a lengthy discussion, she elected  to have a mastectomy with reconstruction.   DESCRIPTION OF PROCEDURE:  The patient is seen in the holding area.  She  already had her blue dye injection in the left breast, and we confirmed an  initial of the left side as the operative side.  She was taken to the  operating room and after satisfactory general anesthesia had been obtained,  approximately 5 mL of a dilute methylene blue solution was injected peri-  areolarly.  The breast was prepped and draped.  A hot area in the axilla was  identified and marked.  A transverse superior incision was made, trying to  do this as a somewhat skin-sparing incision.  A skin flap was raised  medially to the sternum and superiorly towards the clavicle, and then out  into the axilla.  When we had the axillary skin elevated off of the area  that I had marked, I used a Neo-probe and identified a hot; and using  cautery and dissection found initially a single hot node which was  removed.  I saw no blue dye in the axilla whatsoever.  The Neo-probe identified two  other hot nodes, these being a little higher and more anterior.  The first  was a little lower and more posterior.  Once these three were out, there  were no counts above about 15, and the highest count on any node was about  2300.  Packs were placed.  Attention was turned back to the breast.  The inferior incision was made and  an inferior flap raised inferior to the inframammary fold and laterally out  to the latissimus.  The breast was removed from the underlying muscle,  starting medially and working laterally.  We saw an old clip apparently from  her prior surgery, and did note one breast lymph node that we checked, and  did not have any counts in it, but was  part of the specimen.  We checked to  make sure that everything was dry, and irrigated out the axilla and the  breast flaps, and everything looked fine.  At this point Dr. Etter Sjogren came in to complete reconstruction.  Pathology reported that the three sentinel nodes were negative.                                               Currie Paris, M.D.    CJS/MEDQ  D:  09/16/2003  T:  09/16/2003  Job:  [id number]

## 2011-03-05 NOTE — Op Note (Signed)
NAME:  [name], MOLYNEUX NO.:  [id number]   MEDICAL RECORD NO.:  [id number]          PATIENT TYPE:  AMB   LOCATION:  DSC                          FACILITY:  [facility]   PHYSICIAN:  Etter Sjogren, M.D.     DATE OF BIRTH:  March 25, 1936   DATE OF PROCEDURE:  11/11/2004  DATE OF DISCHARGE:                                 OPERATIVE REPORT   PREOPERATIVE DIAGNOSIS:  Breast cancer with inferior displacement of her  left breast implant for reconstruction.   POSTOPERATIVE DIAGNOSIS:  Breast cancer with inferior displacement of her  left breast implant for reconstruction.   PROCEDURE PERFORMED:  Revision of a left breast reconstruction.   SURGEON:  Etter Sjogren, M.D.   ANESTHESIA:  General anesthesia.   ESTIMATED BLOOD LOSS:  Minimal.   CLINICAL NOTE:  A 75 year old woman who has had left breast cancer and  reconstruction with an implant. She feels that the implant is slightly lower  than the right breast and would like to have it elevated.  The nature of the  procedure and risks as well as complications were discussed with her in  great detail and she understood those risks and wished to proceed.   DESCRIPTION OF PROCEDURE:  The patient was taken to the operating room and  placed supine.  The preoperative markings had been placed with patient in  full sitting position for the capsulorrhaphy portion of the procedure.  After successful administration of general anesthesia, she was prepped with  Betadine and draped with sterile drapes.  Incision made at the level of the  mastectomy scar and the dissection carried down into the pocket where the  implant was identified and was removed.  A 27-gauge needle passed through  the skin to mark the area of capsule excision.  This was then excised using  electrocautery taking great care to avoid damage to underlying chest wall.  Achieved hemostasis with  electrocautery.  The plication of the capsule was  performed using 2-0 Vicryl  interrupted buried 2-0 Vicryl sutures.  Out  laterally, running 2-0 Vicryl suture was utilized.  The capsule was then  opened in superior direction where she wanted to have more superior fill  with her implant.  The wound was irrigated with antibiotic solution as well  as with saline.  Meticulous hemostasis with the electrocautery.  The 3-0  Vicryl sutures were preplaced and the muscle layer left untied.  With clean  gloves, the implant was prepared.  This was a [employer] BioDimensional saline  implant, Lot number [id number] 720 mL fill implant.  The 100 mL of sterile  saline placed using a closed fill system.  All the air was removed and the  implant was then positioned in the pocket after again confirming excellent  hemostasis.  The implant had been positioned with care being taken to make  sure it was flat without any wrinkles and in the inferior aspect of the  pocket, the implant was filled to a total of 720 mL using the closed fill  system.  Saline used for irrigation and the 3-0 Vicryl sutures that had been  placed  where then tied securely.  The 3-0 Monocryl interrupted inverted deep  sutures and running 3-0 Monocryl subcuticular suture completed the closure.  Great care taken to avoid damage to underlying implant.  Steri-Strips and  dry sterile dressing applied.  Hypafix tape was then placed along the  capsulorrhaphy site externally on the skin in order to provide some  external support.  She was wrapped with an Ace wrap from inferior toward  superior in order to support the implant in its deep position.   DISPOSITION:  Will recheck in the office next week.      DB/MEDQ  D:  11/11/2004  T:  11/11/2004  Job:  [id number]

## 2011-03-05 NOTE — Op Note (Signed)
NAME:  [name], [name] NO.:  [id number]   MEDICAL RECORD NO.:  [id number]          PATIENT TYPE:  OUT   LOCATION:  DFTL                         FACILITY:  [facility]   PHYSICIAN:  Artist Pais. Weingold, M.D.DATE OF BIRTH:  [birth year]   DATE OF PROCEDURE:  07/08/2004  DATE OF DISCHARGE:  07/08/2004                                 OPERATIVE REPORT   DATE OF OPERATION:  July 08, 2004.   PREOPERATIVE DIAGNOSIS:  Right carpal tunnel syndrome.   POSTOPERATIVE DIAGNOSIS:  Right carpal tunnel syndrome.   PROCEDURE:  Right carpal tunnel release.   SURGEON:  Artist Pais. Mina Marble, MD.   ASSISTANT:  __________.   ANESTHESIA:  General.   TOURNIQUET TIME:  12 minutes.   COMPLICATIONS:  None.   DRAINS:  None.   OPERATIVE REPORT:  The patient was taken to the operating room.  After the  induction of adequate general anesthesia, the right upper extremity was  prepped and draped in the usual sterile fashion.  An Esmarch was used to  exsanguinate the limb.  The tourniquet was inflated to 250 mmHg.  At this  point in time, a 2-cm incision was made in the palmar aspect of the right  hand in line with the long finger metacarpal starting at Kaplan's cardinal  line.  The incision was taken down through the skin and subcutaneous  tissues.  The palmar fascia was identified and split.  The distal edge of  the transverse carpal ligament was divided with a #15 blade.  This exposed  an underlying median nerve and contents of the carpal canal.  The median  nerve was protected with a Therapist, nutritional, and the remaining aspects of the  transverse carpal ligament were divided under direct vision using a curved  blunt scissor.  The canal was inspected.  There were no osseous lesions or  ganglions present.  It was irrigated and then closed with a running a 3-0  Prolene subcuticular stitch.  Steri-Strips, 4x4s, Fluffs, and a compressive  dressing were applied.  The patient tolerated the  procedure well and went to  the recovery room in a stable fashion.      MAW/MEDQ  D:  07/28/2004  T:  07/28/2004  Job:  [id number]

## 2011-03-05 NOTE — Op Note (Signed)
NAME:  [name], [name] NO.:  [id number]   MEDICAL RECORD NO.:  [id number]          PATIENT TYPE:  AMB   LOCATION:  DSC                          FACILITY:  [facility]   PHYSICIAN:  Etter Sjogren, M.D.     DATE OF BIRTH:  10-Mar-1936   DATE OF PROCEDURE:  12/03/2004  DATE OF DISCHARGE:                                 OPERATIVE REPORT   PREOPERATIVE DIAGNOSIS:  Breast cancer with a seroma around breast implant.   POSTOPERATIVE DIAGNOSIS:  Breast cancer with a seroma around breast implant.   PROCEDURE PERFORMED:  Drainage of seroma of left chest reconstruction.   SURGEON:  Etter Sjogren, M.D.   ANESTHESIA:  General.   ESTIMATED BLOOD LOSS:  Minimal.   DRAINS:  One 66 French Blake drain, left.   CLINICAL NOTE:  A 75 year old woman who has had breast reconstruction of the  left breast.  She had revision of that and had done well for almost over two  weeks.  She then suddenly developed a collection of fluid around the  implant.  No evidence of any cellulitis.  No evidence of any infection.  A  drainage procedure was planned.   The procedure and risks were understood by her and she wished to proceed.   DESCRIPTION OF PROCEDURE:  The patient was taken to the operating room and  placed supine.  After a successful induction of general anesthesia, she was  prepped with Betadine and draped with sterile drapes.  The old mastectomy  scar was utilized and the dissection carried down to the subcutaneous  tissues through the underlying pectoralis major muscle.  The seroma was  clear fluid.  It was drained.  A curet was used along the inframammary  crease.  Thorough irrigation with saline.  Excellent hemostasis was achieved  using electrocautery.  A Blake drain was positioned and brought through a  separate stab wound inferolaterally and secured with a 3-0 Prolene suture.  The pocket was irrigated with copious amounts of antibiotic solution which  was allowed to dwell for over five  minutes.  Simple interrupted 2-0 Vicryl  sutures were placed with great care being taken to avoid damage to the  underlying implant.  After allowing a dwell time of over five minutes for  the antibiotic solution, it was removed.  The 2-0 Vicryl sutures were tied  securely.  Interrupted and inverted deep sutures of 3-0 Prolene placed  taking care to  avoid the implant.  Then 3-0 Monocryl running subcuticular suture, Steri-  Strips and dry sterile dressing applied.  She tolerated the procedure well.   DISPOSITION:  Recheck in the office in four days.      DB/MEDQ  D:  12/03/2004  T:  12/03/2004  Job:  [id number]

## 2011-03-09 ENCOUNTER — Encounter (INDEPENDENT_AMBULATORY_CARE_PROVIDER_SITE_OTHER): Payer: Self-pay | Admitting: Surgery

## 2011-03-15 ENCOUNTER — Inpatient Hospital Stay (HOSPITAL_COMMUNITY)
Admission: EM | Admit: 2011-03-15 | Discharge: 2011-03-19 | DRG: 138 | Disposition: A | Discharge status: Home / Self Care | Payer: BC Managed Care – PPO | Attending: Cardiology | Admitting: Cardiology

## 2011-03-15 ENCOUNTER — Inpatient Hospital Stay (HOSPITAL_COMMUNITY): Payer: BC Managed Care – PPO

## 2011-03-15 ENCOUNTER — Encounter (HOSPITAL_COMMUNITY): Payer: Self-pay | Admitting: Radiology

## 2011-03-15 ENCOUNTER — Emergency Department (HOSPITAL_COMMUNITY): Payer: BC Managed Care – PPO

## 2011-03-15 DIAGNOSIS — E876 Hypokalemia: Secondary | ICD-10-CM | POA: Diagnosis present

## 2011-03-15 DIAGNOSIS — Z8249 Family history of ischemic heart disease and other diseases of the circulatory system: Secondary | ICD-10-CM

## 2011-03-15 DIAGNOSIS — Z79899 Other long term (current) drug therapy: Secondary | ICD-10-CM

## 2011-03-15 DIAGNOSIS — Z87891 Personal history of nicotine dependence: Secondary | ICD-10-CM

## 2011-03-15 DIAGNOSIS — E7800 Pure hypercholesterolemia, unspecified: Secondary | ICD-10-CM | POA: Diagnosis present

## 2011-03-15 DIAGNOSIS — A498 Other bacterial infections of unspecified site: Secondary | ICD-10-CM | POA: Diagnosis present

## 2011-03-15 DIAGNOSIS — Z853 Personal history of malignant neoplasm of breast: Secondary | ICD-10-CM

## 2011-03-15 DIAGNOSIS — Z7901 Long term (current) use of anticoagulants: Secondary | ICD-10-CM

## 2011-03-15 DIAGNOSIS — I359 Nonrheumatic aortic valve disorder, unspecified: Secondary | ICD-10-CM | POA: Diagnosis present

## 2011-03-15 DIAGNOSIS — E785 Hyperlipidemia, unspecified: Secondary | ICD-10-CM | POA: Diagnosis present

## 2011-03-15 DIAGNOSIS — N390 Urinary tract infection, site not specified: Secondary | ICD-10-CM | POA: Diagnosis present

## 2011-03-15 DIAGNOSIS — I10 Essential (primary) hypertension: Secondary | ICD-10-CM | POA: Diagnosis present

## 2011-03-15 DIAGNOSIS — I4891 Unspecified atrial fibrillation: Principal | ICD-10-CM | POA: Diagnosis present

## 2011-03-15 HISTORY — DX: Malignant (primary) neoplasm, unspecified: C80.1

## 2011-03-15 HISTORY — DX: Essential (primary) hypertension: I10

## 2011-03-15 LAB — CK TOTAL AND CKMB (NOT AT ARMC)
CK, MB: 4.2 ng/mL — ABNORMAL HIGH (ref 0.3–4.0)
CK, MB: 5.1 ng/mL — ABNORMAL HIGH (ref 0.3–4.0)
Relative Index: INVALID (ref 0.0–2.5)
Total CK: 81 U/L (ref 7–177)
Total CK: 93 U/L (ref 7–177)

## 2011-03-15 LAB — CBC
HCT: 41.9 % (ref 36.0–46.0)
RDW: 12.5 % (ref 11.5–15.5)
WBC: 5.5 10*3/uL (ref 4.0–10.5)

## 2011-03-15 LAB — URINALYSIS, ROUTINE W REFLEX MICROSCOPIC
Bilirubin Urine: NEGATIVE
Ketones, ur: NEGATIVE mg/dL
Nitrite: NEGATIVE
Protein, ur: >300 mg/dL — AB
Specific Gravity, Urine: 1.014 (ref 1.005–1.030)
Urobilinogen, UA: 0.2 mg/dL (ref 0.0–1.0)

## 2011-03-15 LAB — URINE MICROSCOPIC-ADD ON

## 2011-03-15 LAB — COMPREHENSIVE METABOLIC PANEL
ALT: 18 U/L (ref 0–35)
AST: 19 U/L (ref 0–37)
Calcium: 9.4 mg/dL (ref 8.4–10.5)
Creatinine, Ser: 0.82 mg/dL (ref 0.4–1.2)
GFR calc Af Amer: >60 mL/min (ref >60)
GFR calc non Af Amer: >60 mL/min (ref >60)
Sodium: 137 mEq/L (ref 135–145)
Total Protein: 7.2 g/dL (ref 6.0–8.3)

## 2011-03-15 LAB — DIFFERENTIAL
Basophils Absolute: 0 10*3/uL (ref 0.0–0.1)
Basophils Relative: 1 % (ref 0–1)
Eosinophils Relative: 3 % (ref 0–5)
Lymphocytes Relative: 31 % (ref 12–46)
Neutro Abs: 3.1 10*3/uL (ref 1.7–7.7)

## 2011-03-15 LAB — PROTIME-INR
INR: 0.97 (ref 0.00–1.49)
Prothrombin Time: 13.1 seconds (ref 11.6–15.2)

## 2011-03-15 LAB — TROPONIN I
Troponin I: <0.3 ng/mL (ref <0.30)
Troponin I: <0.3 ng/mL (ref <0.30)

## 2011-03-15 LAB — APTT: aPTT: 28 seconds (ref 24–37)

## 2011-03-15 MED ORDER — IOHEXOL 350 MG/ML SOLN
100.0000 mL | Freq: Once | INTRAVENOUS | Status: AC | PRN
  Administered 2011-03-15: 100 mL via INTRAVENOUS

## 2011-03-16 ENCOUNTER — Inpatient Hospital Stay (HOSPITAL_COMMUNITY): Payer: BC Managed Care – PPO

## 2011-03-16 LAB — PROTIME-INR
INR: 0.94 (ref 0.00–1.49)
Prothrombin Time: 12.8 seconds (ref 11.6–15.2)

## 2011-03-16 LAB — URINE CULTURE

## 2011-03-16 LAB — BASIC METABOLIC PANEL
Chloride: 101 mEq/L (ref 96–112)
Creatinine, Ser: 0.88 mg/dL (ref 0.4–1.2)
GFR calc Af Amer: >60 mL/min (ref >60)
GFR calc non Af Amer: >60 mL/min (ref >60)
Potassium: 4.9 mEq/L (ref 3.5–5.1)

## 2011-03-16 LAB — TROPONIN I: Troponin I: <0.3 ng/mL (ref <0.30)

## 2011-03-16 LAB — CK TOTAL AND CKMB (NOT AT ARMC): CK, MB: 4.8 ng/mL — ABNORMAL HIGH (ref 0.3–4.0)

## 2011-03-16 MED ORDER — TECHNETIUM TO 99M ALBUMIN AGGREGATED
6.0000 | Freq: Once | INTRAVENOUS | Status: AC | PRN
  Administered 2011-03-16: 6 via INTRAVENOUS

## 2011-03-17 LAB — PROTIME-INR: INR: 1.05 (ref 0.00–1.49)

## 2011-03-17 LAB — BASIC METABOLIC PANEL
BUN: 17 mg/dL (ref 6–23)
Chloride: 103 mEq/L (ref 96–112)
GFR calc non Af Amer: >60 mL/min (ref >60)
Potassium: 3.8 mEq/L (ref 3.5–5.1)
Sodium: 140 mEq/L (ref 135–145)

## 2011-03-18 ENCOUNTER — Inpatient Hospital Stay (HOSPITAL_COMMUNITY): Payer: BC Managed Care – PPO

## 2011-03-18 LAB — DIFFERENTIAL
Eosinophils Absolute: 0.3 10*3/uL (ref 0.0–0.7)
Lymphocytes Relative: 44 % (ref 12–46)
Lymphs Abs: 2.8 10*3/uL (ref 0.7–4.0)
Monocytes Relative: 9 % (ref 3–12)
Neutro Abs: 2.6 10*3/uL (ref 1.7–7.7)
Neutrophils Relative %: 41 % — ABNORMAL LOW (ref 43–77)

## 2011-03-18 LAB — CBC
HCT: 44.2 % (ref 36.0–46.0)
Hemoglobin: 15.7 g/dL — ABNORMAL HIGH (ref 12.0–15.0)
MCH: 32 pg (ref 26.0–34.0)
MCV: 90.2 fL (ref 78.0–100.0)
RBC: 4.9 MIL/uL (ref 3.87–5.11)
WBC: 6.2 10*3/uL (ref 4.0–10.5)

## 2011-03-18 MED ORDER — TECHNETIUM TC 99M TETROFOSMIN IV KIT
30.0000 | PACK | Freq: Once | INTRAVENOUS | Status: AC | PRN
  Administered 2011-03-18: 30 via INTRAVENOUS

## 2011-03-18 MED ORDER — TECHNETIUM TC 99M TETROFOSMIN IV KIT
10.0000 | PACK | Freq: Once | INTRAVENOUS | Status: AC | PRN
  Administered 2011-03-18: 10 via INTRAVENOUS

## 2011-03-19 ENCOUNTER — Other Ambulatory Visit (HOSPITAL_COMMUNITY): Payer: BC Managed Care – PPO

## 2011-03-19 DIAGNOSIS — K661 Hemoperitoneum: Secondary | ICD-10-CM | POA: Insufficient documentation

## 2011-03-19 LAB — PROTIME-INR
INR: 1.33 (ref 0.00–1.49)
Prothrombin Time: 16.7 seconds — ABNORMAL HIGH (ref 11.6–15.2)

## 2011-03-19 NOTE — H&P (Signed)
NAME:  [name], [name] NO.:  [id number]  MEDICAL RECORD NO.:  [id number]           PATIENT TYPE:  I  LOCATION:  2024                         FACILITY:  [facility]  PHYSICIAN:  Armanda Magic, M.D.     DATE OF BIRTH:  07-20-1936  DATE OF ADMISSION:  03/15/2011 DATE OF DISCHARGE:                             HISTORY & PHYSICAL   REFERRING PHYSICIAN:  Markham Jordan L. [name], MD  PRIMARY PHYSICIAN:  Carola J. Gerri Spore, MD  CHIEF COMPLAINT:  Chest pain and shortness of breath.  HISTORY OF PRESENT ILLNESS:  This is a 75 year old white female with a history of hypertension, dyslipidemia who was seen by our primary physician on Mar 11, 2011, with complaints of chest pain and shortness of breath.  The chest pain was a heaviness in her right side with some shortness of breath.  The pain resolved, but then on Saturday she developed dizziness but with no further chest pain until about 9:15 a.m. On the day of admission while sitting down, she had an episode of pressure, midsternal with radiation to her shoulders and neck.  She denies shortness of breath, nausea, or diaphoresis.  Currently, she has no chest pain.  She was found to have atrial fibrillation with RVR in the ER.  We are now asked to admit the patient.  PAST MEDICAL HISTORY: 1. Hypertension. 2. Dyslipidemia. 3. Menopause. 4. Arthritis. 5. Breast CA.  ALLERGIES:  CRESTOR which causes leg cramps and cough.  SOCIAL HISTORY:  She has a remote history tobacco use, but quit in 1968. She occasionally drinks alcohol.  She is married, with 5 children.  FAMILY HISTORY:  Her father died at 17 of an MI.  Her mother died at 62 of an MI.  She has a sister with hypertension, another sister alive and well.  REVIEW OF SYSTEMS:  Other than what is stated in HPI is negative.  MEDICATIONS: 1. Prilosec 20 mg daily. 2. Melatonin 1 mg daily. 3. Atenolol 100 mg 1/2 tablet b.i.d. 4. Glucosamine 500 mg 2 tablets daily. 5. Tylenol 500  mg 2 tablets p.r.n. 6. Lipitor 40 mg daily. 7. Micardis HCT 80/25 mg 2 tablets daily.  PHYSICAL EXAM:  VITAL SIGNS:  Blood pressure is 111/51, heart rate 87, O2 saturations 98% on 2 liters. GENERAL:  She is well-developed, well-nourished white female in no acute distress. HEENT:  Benign. NECK:  Supple without lymphadenopathy.  Carotid upstrokes are +2 bilaterally.  No bruits. LUNGS:  Clear to auscultation throughout. HEART:  Irregularly irregular.  No murmurs, rubs, or gallops.  Normal S1 and S2. ABDOMEN:  Soft, nontender, nondistended.  Normoactive bowel sounds.  No hepatosplenomegaly. EXTREMITIES:  No cyanosis, erythema, or edema.  LABORATORY DATA:  Sodium 137, potassium 3.3, chloride 98, bicarb 25, BUN 19, creatinine 0.82.  White cell count 5.5, hemoglobin 15.1, hematocrit 41.9, platelet count 196, INR 0.97.  LFTs normal.  Troponin less than 0.3.  CPK 81, MB 4.2.  Chest x-ray shows mild pulmonary vascular prominence.  ASSESSMENT: 1. New-onset atrial fibrillation with rapid ventricular response of     unknown duration, rate now controlled on intravenous Cardizem drip. 2. Hypertension. 3. Recurrent  episodes of chest pain. 4. Hypokalemia.  PLAN:  Admit to telemetry bed.  Cycle cardiac enzymes.  IV Cardizem drip for rate control.  Start Lovenox subcu per pharmacy.  Start Coumadin per pharmacy for AFib.  Check a 2-D echocardiogram to evaluate LV function. We will get a Lexiscan Cardiolite if enzymes are negative.  We will check a TSH, BNP, and D-dimer.  We will change Micardis HCT to 80/12.5 mg 2 tablets daily.     Armanda Magic, M.D.     TT/MEDQ  D:  03/18/2011  T:  03/19/2011  Job:  [id number]  cc:   Otilio Connors. Gerri Spore, M.D.  Electronically Signed by Armanda Magic M.D. on 03/19/2011 01:07:53 PM

## 2011-03-20 ENCOUNTER — Emergency Department (HOSPITAL_COMMUNITY): Payer: BC Managed Care – PPO

## 2011-03-20 ENCOUNTER — Inpatient Hospital Stay (HOSPITAL_COMMUNITY)
Admission: EM | Admit: 2011-03-20 | Discharge: 2011-03-23 | DRG: 188 | Disposition: A | Discharge status: Home / Self Care | Payer: BC Managed Care – PPO | Attending: Internal Medicine | Admitting: Internal Medicine

## 2011-03-20 DIAGNOSIS — I4891 Unspecified atrial fibrillation: Secondary | ICD-10-CM | POA: Diagnosis present

## 2011-03-20 DIAGNOSIS — T45515A Adverse effect of anticoagulants, initial encounter: Secondary | ICD-10-CM | POA: Diagnosis present

## 2011-03-20 DIAGNOSIS — Z7901 Long term (current) use of anticoagulants: Secondary | ICD-10-CM

## 2011-03-20 DIAGNOSIS — M1990 Unspecified osteoarthritis, unspecified site: Secondary | ICD-10-CM | POA: Diagnosis present

## 2011-03-20 DIAGNOSIS — Z853 Personal history of malignant neoplasm of breast: Secondary | ICD-10-CM

## 2011-03-20 DIAGNOSIS — E785 Hyperlipidemia, unspecified: Secondary | ICD-10-CM | POA: Diagnosis present

## 2011-03-20 DIAGNOSIS — I10 Essential (primary) hypertension: Secondary | ICD-10-CM | POA: Diagnosis present

## 2011-03-20 DIAGNOSIS — I498 Other specified cardiac arrhythmias: Secondary | ICD-10-CM | POA: Diagnosis present

## 2011-03-20 DIAGNOSIS — E876 Hypokalemia: Secondary | ICD-10-CM | POA: Diagnosis present

## 2011-03-20 DIAGNOSIS — K661 Hemoperitoneum: Principal | ICD-10-CM | POA: Diagnosis present

## 2011-03-20 DIAGNOSIS — M129 Arthropathy, unspecified: Secondary | ICD-10-CM | POA: Diagnosis present

## 2011-03-20 LAB — URINALYSIS, ROUTINE W REFLEX MICROSCOPIC
Bilirubin Urine: NEGATIVE
Hgb urine dipstick: NEGATIVE
Nitrite: NEGATIVE
Protein, ur: NEGATIVE mg/dL
Specific Gravity, Urine: >1.046 — ABNORMAL HIGH (ref 1.005–1.030)
Urobilinogen, UA: 0.2 mg/dL (ref 0.0–1.0)

## 2011-03-20 LAB — DIFFERENTIAL
Basophils Absolute: 0 10*3/uL (ref 0.0–0.1)
Lymphocytes Relative: 13 % (ref 12–46)
Lymphs Abs: 1.2 10*3/uL (ref 0.7–4.0)
Neutro Abs: 7.8 10*3/uL — ABNORMAL HIGH (ref 1.7–7.7)
Neutrophils Relative %: 81 % — ABNORMAL HIGH (ref 43–77)

## 2011-03-20 LAB — BASIC METABOLIC PANEL
CO2: 21 mEq/L (ref 19–32)
Calcium: 8.7 mg/dL (ref 8.4–10.5)
Creatinine, Ser: 1.2 mg/dL (ref 0.4–1.2)
GFR calc Af Amer: 53 mL/min — ABNORMAL LOW (ref >60)
GFR calc non Af Amer: 44 mL/min — ABNORMAL LOW (ref >60)
Glucose, Bld: 150 mg/dL — ABNORMAL HIGH (ref 70–99)
Sodium: 135 mEq/L (ref 135–145)

## 2011-03-20 LAB — TYPE AND SCREEN
ABO/RH(D): B POS
Antibody Screen: NEGATIVE

## 2011-03-20 LAB — ABO/RH: ABO/RH(D): B POS

## 2011-03-20 LAB — CBC
HCT: 39.9 % (ref 36.0–46.0)
MCV: 90.1 fL (ref 78.0–100.0)
RBC: 4.43 MIL/uL (ref 3.87–5.11)
RDW: 12.3 % (ref 11.5–15.5)
WBC: 9.6 10*3/uL (ref 4.0–10.5)

## 2011-03-20 LAB — PROTIME-INR: INR: 1.45 (ref 0.00–1.49)

## 2011-03-20 MED ORDER — IOHEXOL 300 MG/ML  SOLN
125.0000 mL | Freq: Once | INTRAMUSCULAR | Status: AC | PRN
  Administered 2011-03-20: 125 mL via INTRAVENOUS

## 2011-03-21 LAB — COMPREHENSIVE METABOLIC PANEL
AST: 33 U/L (ref 0–37)
Albumin: 3.6 g/dL (ref 3.5–5.2)
Alkaline Phosphatase: 62 U/L (ref 39–117)
BUN: 24 mg/dL — ABNORMAL HIGH (ref 6–23)
Chloride: 104 mEq/L (ref 96–112)
Potassium: 5 mEq/L (ref 3.5–5.1)
Total Bilirubin: 0.5 mg/dL (ref 0.3–1.2)

## 2011-03-21 LAB — CBC
HCT: 33.6 % — ABNORMAL LOW (ref 36.0–46.0)
Hemoglobin: 11.3 g/dL — ABNORMAL LOW (ref 12.0–15.0)
MCHC: 33.6 g/dL (ref 30.0–36.0)

## 2011-03-21 LAB — PROTIME-INR: INR: 1.11 (ref 0.00–1.49)

## 2011-03-21 LAB — PREPARE FRESH FROZEN PLASMA

## 2011-03-22 LAB — PROTIME-INR: INR: 1.16 (ref 0.00–1.49)

## 2011-03-22 LAB — BASIC METABOLIC PANEL
BUN: 24 mg/dL — ABNORMAL HIGH (ref 6–23)
CO2: 28 mEq/L (ref 19–32)
Chloride: 105 mEq/L (ref 96–112)
Creatinine, Ser: 0.97 mg/dL (ref 0.4–1.2)
Glucose, Bld: 95 mg/dL (ref 70–99)

## 2011-03-24 ENCOUNTER — Ambulatory Visit
Admission: RE | Admit: 2011-03-24 | Discharge: 2011-03-24 | Disposition: A | Discharge status: Home / Self Care | Payer: BC Managed Care – PPO | Source: Ambulatory Visit | Attending: Family Medicine | Admitting: Family Medicine

## 2011-03-24 ENCOUNTER — Other Ambulatory Visit: Payer: Self-pay | Admitting: Family Medicine

## 2011-03-24 DIAGNOSIS — G459 Transient cerebral ischemic attack, unspecified: Secondary | ICD-10-CM

## 2011-03-26 NOTE — Consult Note (Signed)
NAME:  [name], [name] NO.:  [id number]  MEDICAL RECORD NO.:  [id number]           PATIENT TYPE:  I  LOCATION:  1427                         [facility]ACILITY:  [facility]  PHYSICIAN:  Luis Abed, MD, FACCDATE OF BIRTH:  1936-09-16  DATE OF CONSULTATION: DATE OF DISCHARGE:                                CONSULTATION   HISTORY OF PRESENT ILLNESS:  The patient is admitted with a retroperitoneal bleed.  Recently, she was hospitalized from Mar 15, 2011, until March 19, 2011, with new onset atrial fibrillation.  She was managed very nicely during that hospitalization.  Pulmonary embolus was ruled out.  2-D echocardiogram revealed a normal ejection fraction of 60% with moderate aortic stenosis.  A nuclear exercise scan showed no ischemia.  Her atrial fibrillation rate was controlled.  She was started on Coumadin and discharged on a combination of Coumadin and Lovenox. Plans were made for early followup to check her INR and to see Dr. Armanda Magic it for a cardiology followup.  Upon arriving at home and shortly thereafter the patient began to develop abdominal and left leg discomfort that became quite severe.  She came back to the hospital and was admitted with evidence of a retroperitoneal bleed.  Her hemoglobin is stable.  At this time, her pain is resolved.  ALLERGIES:  She is intolerant to CRESTOR.  MEDICATIONS:  Included amoxicillin, Coumadin, diltiazem 240, Lovenox, Micardis hydrochlorothiazide, potassium, atenolol, calcium, Lipitor 40, melatonin, Prilosec, Tylenol.  OTHER MEDICAL PROBLEMS:  See the list below.  SOCIAL HISTORY:  The patient does not smoke.  FAMILY HISTORY:  There is a family history of coronary artery disease.  REVIEW OF SYSTEMS:  The patient denies fever, chills, headache, sweats, rash, change in vision, change in hearing, chest pain, cough, nausea, vomiting, or urinary symptoms.  She does mention that she is feeling some weakness in her left leg.   All other systems are reviewed and are negative.  PHYSICAL EXAMINATION:  VITAL SIGNS:  Blood pressure is 130/86 with a pulse of 84. GENERAL:  The patient is oriented to person, time, and place.  Affect is normal. HEENT:  Head is atraumatic. NECK:  There is no jugular venous distention.  There are no carotid bruits. LUNGS:  Clear.  Respiratory effort is not labored. CARDIAC:  Exam reveals an S1 with an S2.  There is a crescendo- decrescendo murmur of aortic stenosis. ABDOMEN:  Soft. EXTREMITIES:  There is no peripheral edema.  There are no musculoskeletal deformities. NEUROLOGIC:  There is no obvious neurologic abnormality affecting her left leg at this time. SKIN:  There are no skin rashes.  LABORATORY DATA:  Hemoglobin during her very recent admission was 15.5. Early today it was 14.0.  BUN is 32 and creatinine 1.2.  These are increased from a BUN of 17 and a creatinine of 0.85.  Her potassium is decreased to 3.2.  There are no cardiac enzymes drawn on this admission. They had been drawn on the recent admission and were normal.  INR on admission was 1.45.  There is no EKG done.  Hip films revealed no evidence of fracture or dislocation.  CT  of the abdomen and pelvis revealed enlargement of the left iliopsoas muscle with high-density material in the left retroperitoneum.  This is compatible with a retroperitoneal hematoma.  There are also some other areas in the left iliopsoas muscle suggestive of older liquefied hematoma.  There was also incidental cholelithiasis with no evidence of inflammation.  PROBLEMS: 1. Potassium 3.2.  Potassium has been ordered. 2. Mild increase in renal function.  She needs to be hydrated     carefully during this admission. 3. History of recent urinary tract infection, for which she is on     antibiotics. 4. Ejection fraction 60% by 2-D echocardiogram on Mar 16, 2011. 5. Moderate aortic stenosis by two-dimensional echo on Mar 16, 2011. 6. Atrial  fibrillation.  Recent new onset.  Rate is controlled at this     time.  She cannot be anticoagulated at this time. 7. Recent chest pain on Mar 15, 2011.  Cardiolite revealed no     ischemia. 8. Cholelithiasis.  This is an incidental finding on her abdominal CT     this admission. 9. Admission with retroperitoneal bleed.  The patient had gone home on     Coumadin and Lovenox.  The admission INR was not elevated.  It was     1.45.  Unfortunately, she has had a significant spontaneous     retroperitoneal bleed.  She has a retroperitoneal hematoma in the     left iliopsoas measuring 3.2 x 1.7 cm.  There is also some old     hematoma in the iliopsoas.  The etiology of this is not completely     clear.  The patient's pain is improved.  Unfortunately, she     mentioned some difficulty with strength in her left leg.  I think     it would be extremely unusual to develop a long term problem     related to muscle weakness in her extremity related to this bleed,     but this needs to be followed very carefully.  Also, we will have     nursing precautions to be sure that she is helped to the bathroom     and back.  Of course at this time, she has to be off aspirin and     all anticoagulants.  This has already been done by the admitting     team.  It will be helpful to be sure that she is hydrated.  Her     renal function needs to be watched.  Otherwise, we need to watch     her for pain control and hemoglobin control to be sure that she     stabilize this.  I have carefully consider whether she should     receive vitamin K.  Her INR was not significantly elevated.  With     her atrial fibrillation, it would be optimal to not give vitamin K.     However, if she were to develop unstable bleeding or significant     change in neurologic effects, then we would have to consider     vitamin K.  We will follow her with you.  We will be sure that Dr.     Armanda Magic is aware that she is  here.     Luis Abed, MD, [facility]     JDK/MEDQ  D:  03/20/2011  T:  03/20/2011  Job:  [id number]  cc:   Armanda Magic, M.D. Fax: [phone]  Carola J. Gerri Spore, M.D.  Fax: [phone]  Electronically Signed by Willa Rough MD [facility] on 03/26/2011 05:46:26 PM

## 2011-04-13 NOTE — Discharge Summary (Signed)
[name], VULTAGGIO NO.:  [id number]  MEDICAL RECORD NO.:  [id number]           PATIENT TYPE:  I  LOCATION:  2024                         FACILITY:  [facility]  PHYSICIAN:  Armanda Magic, M.D.     DATE OF BIRTH:  1936-04-14  DATE OF ADMISSION:  03/15/2011 DATE OF DISCHARGE:  03/19/2011                              DISCHARGE SUMMARY   REFERRING PHYSICIAN:  Markham Jordan L. [name], MD  PRIMARY CARE DOCTOR:  Carola J. Gerri Spore, MD  PROCEDURES:  None.  ADMISSION DIAGNOSES: 1. New-onset atrial fibrillation with rapid ventricular response. 2. Hypertension. 3. Chest pain.  DISCHARGE DIAGNOSES: 1. New-onset atrial fibrillation of unknown duration, rate controlled. 2. Systemic anticoagulation, on Lovenox and Coumadin bridge. 3. Elevated MB with normal CPK and troponin and chest pain with     negative Lexiscan Cardiolite for ischemia and normal left     ventricular function. 4. Mild-to-moderate aortic stenosis/aortic insufficiency by echo. 5. Urinary tract infection. 6. Elevated D-dimer with negative chest CT for pulmonary embolism.  HISTORY OF PRESENT ILLNESS:  This is a 75 year old female with a history of hypertension, dyslipidemia who presented with atypical chest pain. She was sent to the emergency room on Mar 15, 2011, and was found be rapid in atrial fibrillation with RVR.  HOSPITAL COURSE:  The patient was admitted to telemetry bed and started on IV Cardizem drip as well as subcu Lovenox.  She was found to be hypokalemic and her potassium was repleted.  She was also found during her hospital stay to have a urinary tract infection positive for Escherichia coli and was started on amoxicillin.  The Escherichia coli was pansensitive.  Her atrial fibrillation was well controlled on IV Cardizem and subsequently converted to p.o. Cardizem.  Of note, she had been taking Micardis HCT 80/25 mg 2 tablets daily and this was changed to Micardis HCT 80/12.5 mg 2 tablets  daily to avoid too much diuretics given her hypokalemia when she came in.  She was also placed on potassium supplements.  Because of her chest pain and elevated MB in the setting of normal CPK and troponin, she did undergo a Lexiscan Cardiolite which showed no inducible ischemia and normal LV function, EF 59%.  She also underwent 2D echocardiogram prior to the stress test, showing normal LV function with mild-to-moderate aortic stenosis and aortic insufficiency.  Also because of an elevated D-dimer, a chest CT was done which was poor in quality because of respiratory motion.  It showed no evidence of large Pes, but there was a mild mosaic attenuation pattern which was read as possibly could represent pulmonary artery embolic disease, so she subsequently underwent a V/Q scan which was low probability for acute PE.  She was given Lovenox teaching, so that she did get home Lovenox therapy and on the day of discharge, she was ambulating without difficulty with well controlled heart rate in atrial fibrillation.  She was discharged home in stable condition.  LABS:  On the day of discharge included INR of 1.33.  White cell count 6.2, hemoglobin 15.7, hematocrit 44.2, platelet count 212.  Sodium 140, potassium 3.8, chloride 103, CO2 24, glucose  96, BUN 17, creatinine 0.85.  DISCHARGE MEDICATIONS: 1. Amoxicillin 500 mg 1 tablet 3 times daily for 3 more days. 2. Coumadin 5 mg daily. 3. Diltiazem CD 240 mg daily. 4. Lovenox 90 mg 1 injection subcu q.12 hours until seen in our     [facility] on March 22, 2011. 5. Micardis HCT 80/12.5 mg 2 tablets daily. 6. Potassium chloride 20 mEq 1 tablet daily. 7. Atenolol 100 mg 1/2 tablet b.i.d. 8. Calcium citrate over the counter 1 tablet daily. 9. Glucosamine over the counter 1 tablet twice daily. 10.Lipitor 40 mg daily. 11.Melatonin 1 tablet daily. 12.Prilosec 40 mg daily. 13.Tylenol 650 mg 2 tablets daily as needed. 14.Tylenol PM over the counter  2 tablets at bedtime as needed.  FOLLOWUP:  She will follow up with Alfonse Ras, our pharmacologist, in [facility]madin Clinic in my office on March 22, 2011 at 8:45 a.m.  She will follow up with me in 2 weeks on April 02, 2011, at 1:30 p.m.  I spent a total of 40 minutes in preparation of the patient's discharge including discharge note, discussing discharge medications, and plan with the patient, preparing discharge medications and medication summary sheet and dictating this discharge.     Armanda Magic, M.D.     TT/MEDQ  D:  03/19/2011  T:  03/19/2011  Job:  [id number]  cc:   Otilio Connors. Gerri Spore, M.D.  Electronically Signed by Armanda Magic M.D. on 04/13/2011 09:56:06 PM

## 2011-04-22 NOTE — Discharge Summary (Signed)
[name], [name] NO.:  [id number]  MEDICAL RECORD NO.:  [id number]  LOCATION:  1427                         FACILITY:  [facility]  PHYSICIAN:  Clydia Llano, MD       DATE OF BIRTH:  02/22/1936  DATE OF ADMISSION:  03/20/2011 DATE OF DISCHARGE:                              DISCHARGE SUMMARY   PRIMARY CARE PHYSICIAN:  Carola J. Gerri Spore, M.D.  PRIMARY CARDIOLOGIST:  Armanda Magic, M.D.  REASON FOR ADMISSION:  Retroperitoneal hemorrhage.  DISCHARGE DIAGNOSES: 1. Retroperitoneal hemorrhage. 2. Atrial fibrillation, rate controlled. 3. Hypertension. 4. Dyslipidemia. 5. Osteoarthritis. 6. History of breast cancer.  DISCHARGE MEDICATIONS: 1. Tramadol 50 mg half to 1 tablet every 8 hours as needed for pain. 2. Atenolol 25 mg p.o. daily. 3. Diltiazem CD 240 mg p.o. daily. 4. Saline nasal spray OTC 2 sprays nasally daily as needed. 5. Tylenol PM 2 tablets daily at bedtime. 6. Calcium citrate OTC 1 tablet p.o. daily. 7. Glucosamine 1500 mg/1200 mg 1 tablet p.o. b.i.d. 8. Lipitor 40 mg p.o. daily. 9. Melatonin OTC 1 tablet p.o. daily at bedtime. 10.Micardis HCT 80/12.5 two tablets p.o. daily. 11.Potassium chloride 20 mEq p.o. daily. 12.Prilosec 40 mg p.o. daily. 13.Tylenol arthritis 650 mg 2 tablets daily.  Stop taking the following medications: 1. Warfarin 5 mg p.o. daily. 2. Lovenox 100 mg subcu q.12 h.  BRIEF HISTORY EXAMINATION:  [name] is a 75 year old [place] female.  The patient came into the emergency department complaining of abdominal pain.  The patient was discharged one day prior to presentation to [facility] ED after she spent 4 days in [facility] because of atrial fibrillation.  The patient had rate controlled with beta-blockers and Cardizem and was sent home on anticoagulation.  The very next day, the patient came in complaining about abdominal pain and difficulty moving the left lower extremity. Upon initial  evaluation in the emergency department, CT scan was done and the patient was found to have retroperitoneal hematoma with left iliopsoas muscle involvement.  The patient admitted to the hospital for further evaluation.  HOSPITAL COURSE.: 1. Retroperitoneal hematoma.  The patient admitted to the hospital on     telemetry bed.  The patient did have very severe pain, so IV     narcotic was instituted for pain medication.  The anticoagulation     was stopped.  The patient's INR was already low at 1.4 but she was     given 1 unit of FFP.  The Lovenox was stopped.  The patient was     improving in terms of pain.  Physical Therapy evaluated the     patient, recommended it was okay to discharge home.  The pain is     controlled, the patient is off the anticoagulation.  So she will be     discharged home to follow up with her primary cardiologist Dr.     Mayford Knife for recommendation on anticoagulation and when to restart     it. 2. Atrial fibrillation.  Rate controlled, was recently diagnosed.  The     patient was started on anticoagulation but complicated     retroperitoneal bleeding that was stopped.  The patient  was     discharged home on beta-blockers and Cardizem.  There is no     anticoagulation and no antiplatelet because of the bleeding.  The     patient to follow up with her primary cardiologist.  Of note while     the patient was in the hospital, she developed bradycardia.     Atenolol was decreased to 50 by Dr. Donnie Aho who saw her in     consultation but the patient still has heart rate in the 50s.  So     at time of discharge, I decreased the atenolol to 25 mg a day.     Cardizem stays at 240 mg a day. 3. Arthritis.  The patient was complaining about muscle aches and     pains. As a matter of fact, the patient did have the pain of the     retroperitoneal hematoma before she left the hospital but she     thought it might be secondary to arthritis.  The patient continued     on her  Tylenol arthritis.  Had a prescription for tramadol for the     pain from the retroperitoneal hematoma. 4. Hypertension.  The patient is on Micardis with the addition of the     atenolol probably that will provide better help.  While the patient     was in the hospital, her blood pressure control was okay.  CONSULTS:  Dr. Willa Rough, Cardiology.  RADIOLOGY: 1. CT of abdomen and pelvis on March 20, 2011, showed enlargement of the     left iliopsoas muscle with high density material in the left     retroperitoneum.  Findings are most compatible with retroperitoneal     hematoma.  Two areas of low densities within the left iliacus     muscle are suggestive for older or liquefied hematoma.  There is     cholelithiasis, no evidence of cholecystitis.  There is punctate     low density in the right kidney and liver, nonspecific but probably     represents small cysts. 2. X-ray of the left hip on March 20, 2011, showed no evidence of     fracture or dislocation.  DISCHARGE INSTRUCTIONS: 1. Diet:  Heart-healthy diet. 2. Activity:  As tolerated. 3. Disposition:  Home.     Clydia Llano, MD     ME/MEDQ  D:  03/23/2011  T:  03/23/2011  Job:  [id number]  cc:   Otilio Connors. Gerri Spore, M.D. Fax: [phone]  Armanda Magic, M.D. Fax: [phone]  Electronically Signed by Clydia Llano  on 04/22/2011 01:46:24 PM

## 2011-04-22 NOTE — H&P (Signed)
NAME:  [name], [name] NO.:  [id number]  MEDICAL RECORD NO.:  [id number]           PATIENT TYPE:  I  LOCATION:  1427                         FACILITY:  [facility]  PHYSICIAN:  Clydia Llano, MD       DATE OF BIRTH:  [birth year]  DATE OF ADMISSION:  03/20/2011 DATE OF DISCHARGE:                             HISTORY & PHYSICAL   PRIMARY CARE PHYSICIAN:  Carola J. Gerri Spore, M.D.  REASON FOR ADMISSION:  Retroperitoneal hemorrhage.  HISTORY OF PRESENT ILLNESS:  [name] is a 75 year old [place] American female.  The patient was discharged from [facility] yesterday after she stayed 4 days for uncomplicated hospital stay for atrial fibrillation.  The patient was discharged on Lovenox and Coumadin..  The patient came in today with unbearable abdominal pain. The patient said the pain started before she was discharged from the hospital.  She did have history of hip arthritis, so she thought it was part of it but when she went home, the pain started to get worse and that is why she came in back to the emergency department.  The pain is 11 out of 10 and the patient is comparing that to natural childbirth. Pain is radiating down to the left thigh, constant and increases with movement.  Upon initial evaluation in the emergency department, CT scan of abdomen and pelvis showed retroperitoneal hematoma with enlargement of the left iliopsoas muscle.  The patient will be admitted for further evaluation.  PAST MEDICAL HISTORY: 1. Recently diagnosed atrial fibrillation, on anticoagulation. 2. Hypertension. 3. Dyslipidemia. 4. History of breast cancer. 5. Arthritis.  ALLERGIES:  Intolerant to CRESTOR which causes leg cramps and cough.  SOCIAL HISTORY:  Father died at age 20 of MI.  Her mother of MI at age 56.  REVIEW OF SYSTEMS:  GENERAL:  Denies fever, chills.  EYES:  Denies any blurry vision.  ENT:  Denies any abnormalities.  CARDIOVASCULAR:  Denies chest pain  or palpitation, although she is still on atrial fibrillation with rate controlled.  RESPIRATORY:  Denies any shortness of breath or wheezes.  GASTROINTESTINAL:  Denies nausea, vomiting, diarrhea. NEUROLOGY:  Denies headache, denies focal neurological weaknesses. PSYCHIATRIC:  Denies any abnormality of mood or affect.  GU: Denies any dysuria or hematuria.  SKIN:  Denies any irritation or suspicious lesion.  MUSCULOSKELETAL:  Has pain in the anterior aspect of the hip joint as well as some abdominal pain.  PHYSICAL EXAMINATION:  VITAL SIGNS:  Temperature is 98.7, pulse 67, respirations 20, blood pressure 133/86, O2 sats 96% on room air. GENERAL:  The patient is alert, awake, oriented, oriented x3.  The patient is a Caucasian female, wearing hospital gown, laid on her back, looks uncomfortable. HEENT:  Head and face, normocephalic and atraumatic.  ENT:  No abnormalities.  Mouth without oral thrush or lesions. NECK:  Supple.  No mass.  No meningeal signs.  No JVD appreciated. CHEST:  Nontender, symmetrical to breathing bilaterally. LUNGS:  Clear to auscultation bilaterally. CARDIOVASCULAR:  Irregularly irregular rhythm, rate 70-80.  No murmurs, rubs or gallops. ABDOMEN:  Bowel sounds heard.  Soft, nontender, nondistended.  Abdominal wall, there  is some discoloration likely secondary to Lovenox shot, not secondary to a true peritoneal hemorrhage. EXTREMITIES:  Lower extremities:  There is no pedal edema. NEUROLOGIC:  Neurovascularly intact.  Alert, awake, oriented x3.  No gross motor or sensory deficit.  Cranial nerves II through XII grossly intact.  RADIOLOGY:  CT scan of abdomen and pelvis showed enlargement of left iliopsoas muscle with high-density material in the left retroperitoneum. Findings are most compatible with retroperitoneal hematoma.  There are 2 areas of low density within the left iliac muscle suggestive of older or liquefied hematoma.  There is cholelithiasis without  evidence of inflammation, punctate low densities in the right kidney and liver nonspecific but probably represents small cysts.  LABORATORY DATA: 1. BMP; sodium 135, potassium 3.2, chloride 1, bicarb is 21, glucose     150, BUN is 32, creatinine 1.2, calcium 8.7. 2. CBC, WBC 9.6, hemoglobin 14.0, hematocrit 39.9, platelets 195. 3. PT/INR, INR is 1.45.  ASSESSMENT/PLAN: 1. Retroperitoneal hematoma.  Likely secondary to the recent     anticoagulation with Lovenox and Coumadin, although the INR is     still subtherapeutic but patient was on therapeutic Lovenox.  We     will hold anticoagulation, monitor closely.  We will treated pain     aggressively as the pain is unbearable for now. 2. Atrial fibrillation, rate controlled.  The anticoagulation will be     held because of the retroperitoneal hemorrhage but continue the     rate control medications. 3. Hypertension.  We will restart home medications.     Clydia Llano, MD     ME/MEDQ  D:  03/20/2011  T:  03/20/2011  Job:  [id number]  cc:   Armanda Magic, M.D. Fax: [phone]  Carola J. Gerri Spore, M.D. Fax: [phone]  Electronically Signed by Clydia Llano  on 04/22/2011 01:45:33 PM

## 2011-09-30 ENCOUNTER — Encounter (INDEPENDENT_AMBULATORY_CARE_PROVIDER_SITE_OTHER): Payer: Self-pay | Admitting: Surgery

## 2011-11-13 ENCOUNTER — Observation Stay (HOSPITAL_COMMUNITY)
Admission: EM | Admit: 2011-11-13 | Discharge: 2011-11-14 | DRG: 138 | Disposition: A | Discharge status: Home / Self Care | Payer: BC Managed Care – PPO | Attending: Internal Medicine | Admitting: Internal Medicine

## 2011-11-13 ENCOUNTER — Other Ambulatory Visit: Payer: Self-pay

## 2011-11-13 ENCOUNTER — Encounter (HOSPITAL_COMMUNITY): Payer: Self-pay | Admitting: Emergency Medicine

## 2011-11-13 DIAGNOSIS — Z853 Personal history of malignant neoplasm of breast: Secondary | ICD-10-CM | POA: Insufficient documentation

## 2011-11-13 DIAGNOSIS — I10 Essential (primary) hypertension: Secondary | ICD-10-CM | POA: Diagnosis present

## 2011-11-13 DIAGNOSIS — R079 Chest pain, unspecified: Secondary | ICD-10-CM | POA: Insufficient documentation

## 2011-11-13 DIAGNOSIS — I4891 Unspecified atrial fibrillation: Principal | ICD-10-CM | POA: Insufficient documentation

## 2011-11-13 DIAGNOSIS — N183 Chronic kidney disease, stage 3 unspecified: Secondary | ICD-10-CM | POA: Insufficient documentation

## 2011-11-13 DIAGNOSIS — E7800 Pure hypercholesterolemia, unspecified: Secondary | ICD-10-CM | POA: Insufficient documentation

## 2011-11-13 DIAGNOSIS — I129 Hypertensive chronic kidney disease with stage 1 through stage 4 chronic kidney disease, or unspecified chronic kidney disease: Secondary | ICD-10-CM | POA: Insufficient documentation

## 2011-11-13 LAB — DIFFERENTIAL
Lymphocytes Relative: 32 % (ref 12–46)
Lymphs Abs: 1.7 10*3/uL (ref 0.7–4.0)
Monocytes Relative: 6 % (ref 3–12)
Neutro Abs: 3.1 10*3/uL (ref 1.7–7.7)
Neutrophils Relative %: 59 % (ref 43–77)

## 2011-11-13 LAB — PROTIME-INR
INR: 1.73 — ABNORMAL HIGH (ref 0.00–1.49)
Prothrombin Time: 20.6 seconds — ABNORMAL HIGH (ref 11.6–15.2)

## 2011-11-13 LAB — POCT I-STAT, CHEM 8
BUN: 33 mg/dL — ABNORMAL HIGH (ref 6–23)
Calcium, Ion: 1.17 mmol/L (ref 1.12–1.32)
Chloride: 109 mEq/L (ref 96–112)
HCT: 43 % (ref 36.0–46.0)
Potassium: 4.1 mEq/L (ref 3.5–5.1)
Sodium: 141 mEq/L (ref 135–145)

## 2011-11-13 LAB — CARDIAC PANEL(CRET KIN+CKTOT+MB+TROPI)
Relative Index: 3.2 — ABNORMAL HIGH (ref 0.0–2.5)
Relative Index: 3.3 — ABNORMAL HIGH (ref 0.0–2.5)
Total CK: 199 U/L — ABNORMAL HIGH (ref 7–177)
Troponin I: <0.3 ng/mL (ref <0.30)

## 2011-11-13 LAB — CBC
Hemoglobin: 14.9 g/dL (ref 12.0–15.0)
MCH: 32.1 pg (ref 26.0–34.0)
Platelets: 237 10*3/uL (ref 150–400)
RBC: 4.64 MIL/uL (ref 3.87–5.11)
WBC: 5.3 10*3/uL (ref 4.0–10.5)

## 2011-11-13 LAB — POCT I-STAT TROPONIN I: Troponin i, poc: 0.02 ng/mL (ref 0.00–0.08)

## 2011-11-13 MED ORDER — DILTIAZEM HCL 100 MG IV SOLR
10.0000 mg/h | INTRAVENOUS | Status: DC
  Administered 2011-11-13: 10 mg/h via INTRAVENOUS
  Filled 2011-11-13 (×2): qty 100

## 2011-11-13 MED ORDER — MELATONIN-PYRIDOXINE 3-1 MG PO TABS: 1.0000 | ORAL_TABLET | Freq: Every day | ORAL | Status: DC

## 2011-11-13 MED ORDER — DILTIAZEM HCL ER 240 MG PO CP24
240.0000 mg | ORAL_CAPSULE | Freq: Every day | ORAL | Status: DC
  Filled 2011-11-13: qty 1

## 2011-11-13 MED ORDER — TRAMADOL HCL 50 MG PO TABS: 50.0000 mg | ORAL_TABLET | Freq: Four times a day (QID) | ORAL | Status: DC | PRN

## 2011-11-13 MED ORDER — ATENOLOL 25 MG PO TABS
25.0000 mg | ORAL_TABLET | Freq: Every day | ORAL | Status: DC
  Administered 2011-11-14: 50 mg via ORAL
  Filled 2011-11-13: qty 1

## 2011-11-13 MED ORDER — ZOLPIDEM TARTRATE 5 MG PO TABS
5.0000 mg | ORAL_TABLET | Freq: Every evening | ORAL | Status: DC | PRN
  Administered 2011-11-13: 5 mg via ORAL
  Filled 2011-11-13: qty 1

## 2011-11-13 MED ORDER — ASPIRIN EC 325 MG PO TBEC
325.0000 mg | DELAYED_RELEASE_TABLET | Freq: Every day | ORAL | Status: DC
  Administered 2011-11-13 – 2011-11-14 (×2): 325 mg via ORAL
  Filled 2011-11-13 (×2): qty 1

## 2011-11-13 MED ORDER — CALCIUM CITRATE 950 (200 CA) MG PO TABS
200.0000 mg | ORAL_TABLET | Freq: Every day | ORAL | Status: DC
  Filled 2011-11-13: qty 1

## 2011-11-13 MED ORDER — PANTOPRAZOLE SODIUM 40 MG PO TBEC
80.0000 mg | DELAYED_RELEASE_TABLET | Freq: Every day | ORAL | Status: DC
  Administered 2011-11-13: 80 mg via ORAL
  Filled 2011-11-13: qty 1

## 2011-11-13 MED ORDER — ACETAMINOPHEN 325 MG PO TABS
650.0000 mg | ORAL_TABLET | Freq: Four times a day (QID) | ORAL | Status: DC | PRN
  Administered 2011-11-13: 650 mg via ORAL
  Filled 2011-11-13: qty 2

## 2011-11-13 MED ORDER — NITROGLYCERIN 0.4 MG SL SUBL: 0.4000 mg | SUBLINGUAL_TABLET | SUBLINGUAL | Status: DC | PRN

## 2011-11-13 MED ORDER — WARFARIN SODIUM 10 MG PO TABS
10.0000 mg | ORAL_TABLET | Freq: Once | ORAL | Status: AC
  Administered 2011-11-13: 10 mg via ORAL
  Filled 2011-11-13: qty 1

## 2011-11-13 MED ORDER — DILTIAZEM HCL 100 MG IV SOLR
5.0000 mg/h | Freq: Once | INTRAVENOUS | Status: AC
  Administered 2011-11-13: 5 mg/h via INTRAVENOUS
  Filled 2011-11-13: qty 100

## 2011-11-13 MED ORDER — SODIUM CHLORIDE 0.45 % IV SOLN: INTRAVENOUS | Status: DC

## 2011-11-13 MED ORDER — ONDANSETRON HCL 4 MG/2ML IJ SOLN: 4.0000 mg | Freq: Four times a day (QID) | INTRAMUSCULAR | Status: DC | PRN

## 2011-11-13 MED ORDER — NON FORMULARY: 1.0000 | Freq: Every day | Status: DC

## 2011-11-13 MED ORDER — CALCIUM CITRATE + PO TABS: 1.0000 | ORAL_TABLET | Freq: Every day | ORAL | Status: DC

## 2011-11-13 MED ORDER — ONDANSETRON HCL 4 MG PO TABS: 4.0000 mg | ORAL_TABLET | Freq: Four times a day (QID) | ORAL | Status: DC | PRN

## 2011-11-13 MED ORDER — ATORVASTATIN CALCIUM 40 MG PO TABS
40.0000 mg | ORAL_TABLET | Freq: Every day | ORAL | Status: DC
  Administered 2011-11-14: 40 mg via ORAL
  Filled 2011-11-13: qty 1

## 2011-11-13 MED ORDER — ACETAMINOPHEN 650 MG RE SUPP: 650.0000 mg | Freq: Four times a day (QID) | RECTAL | Status: DC | PRN

## 2011-11-13 NOTE — Consult Note (Signed)
Admit date: 11/13/2011 Referring Physician  Dr. Cleotis Lema Primary Cardiologist  Dr. Mayford Knife Reason for Consultation : Evaluation of afib with RVR and chest pain.   HPI: 76 year old with paroxsymal afib on chronic anticoagulation with NUC stress test in 5/12- no ischemia, and ETT 11/12 - low risk, no ischemia here with RVR afib now rate controlled with diltiazem gtt.   This started at 7am this morning with CP and palpitations, burning and sharp about 3/10 in intensity which resolved with NTG. Troponin in ER was normal. ECG shows no ischemic changes.   She currently feels well. Just came back from [place] about a week ago. When playing with grandkids she felt well.   PMH:   Past Medical History  Diagnosis Date  . Carpal tunnel syndrome   . Hypertension   . Hypercholesteremia   . Cancer     lt breast    PSH:   Past Surgical History  Procedure Date  . Mastectomy 2004    LEFT  . Carpal tunnel release     right hand  . Appendectomy   . Abdominal hysterectomy     states "steralized"   Allergies:  Crestor Prior to Admit Meds:   Prescriptions prior to admission  Medication Sig Dispense Refill  . atenolol (TENORMIN) 25 MG tablet Take 25 mg by mouth daily.      Marland Kitchen atorvastatin (LIPITOR) 40 MG tablet Take 40 mg by mouth daily.        . Calcium Citrate-Vitamin D (CALCIUM CITRATE + PO) Take 1 tablet by mouth daily.      Marland Kitchen diltiazem (DILACOR XR) 240 MG 24 hr capsule Take 240 mg by mouth daily.      . Melatonin-Pyridoxine (MELATIN PO) Take 1 tablet by mouth at bedtime.      Marland Kitchen omeprazole (PRILOSEC) 40 MG capsule Take 40 mg by mouth daily.      Marland Kitchen OVER THE COUNTER MEDICATION Place 1 spray into the nose daily. Oil based nasal spray      . potassium chloride SA (K-DUR,KLOR-CON) 20 MEQ tablet Take 20 mEq by mouth daily.      Marland Kitchen telmisartan-hydrochlorothiazide (MICARDIS HCT) 80-12.5 MG per tablet Take 2 tablets by mouth daily.      . traMADol (ULTRAM) 50 MG tablet Take 50 mg by mouth every 6 (six)  hours as needed. For pain      . warfarin (COUMADIN) 5 MG tablet Take 5-7.5 mg by mouth. Takes 1 tablet (5mg ) 3 days week & 1.5 tablet (7.5mg ) 4 days a week.  Had 7.5mg  yesterday.       Fam HX:    Family History  Problem Relation Age of Onset  . Heart disease Mother   . Heart disease Father    Social HX:    History   Social History  . Marital Status: Married    Spouse Name: N/A    Number of Children: N/A  . Years of Education: N/A   Occupational History  . Not on file.   Social History Main Topics  . Smoking status: Former Games developer  . Smokeless tobacco: Not on file  . Alcohol Use: Yes     SOMETIMES  . Drug Use: No  . Sexually Active:    Other Topics Concern  . Not on file   Social History Narrative  . No narrative on file     ROS:  All 11 ROS were addressed and are negative except what is stated in the HPI  Physical Exam:  Blood pressure 138/87, pulse 93, temperature 97.6 F (36.4 C), temperature source Oral, resp. rate 16, height 5\' 7"  (1.702 m), weight 93.5 kg (206 lb 2.1 oz), SpO2 95.00%.    General: Well developed, well nourished, in no acute distress Head: Eyes PERRLA, No xanthomas.   Normal cephalic and atramatic  Lungs:  Clear bilaterally to auscultation and percussion. Normal respiratory effort. No wheezes, no rales. Heart:  Irreg irreg Pulses are 2+ & equal.            No carotid bruit. No JVD.  No abdominal bruits. No femoral bruits. Abdomen: Bowel sounds are positive, abdomen soft and non-tender without masses. No hepatosplenomegaly. Msk:  Back normal, normal gait. Normal strength and tone for age. Extremities:  No clubbing, cyanosis or edema.  DP +1 Neuro: Alert and oriented X 3, non-focal, MAE x 4 GU: Deferred Rectal: Deferred Psych:  Good affect, responds appropriately    Labs:   Lab Results  Component Value Date   WBC 5.3 11/13/2011   HGB 14.6 11/13/2011   HCT 43.0 11/13/2011   MCV 91.4 11/13/2011   PLT 237 11/13/2011    Lab 11/13/11 1146  NA  141  K 4.1  CL 109  CO2 --  BUN 33*  CREATININE 1.00  CALCIUM --  PROT --  BILITOT --  ALKPHOS --  ALT --  AST --  GLUCOSE 123*   No results found for this basename: PTT   Lab Results  Component Value Date   INR 1.73* 11/13/2011   INR 1.14 03/23/2011   INR 1.16 03/22/2011   Lab Results  Component Value Date   CKTOTAL 94 03/16/2011   CKMB 4.8* 03/16/2011   TROPONINI  Value: <0.30        Due to the release kinetics of cTnI, a negative result within the first hours of the onset of symptoms does not rule out myocardial infarction with certainty. If myocardial infarction is still suspected, repeat the test at appropriate intervals. **Please note change in reference range. 03/16/2011    EKG:  Afib RVR Personally viewed.   ECHO 11/12: 1. Moderate concentric left ventricular hypertrophy. 2. Left ventricular ejection fraction estimated by 2D at 68% percent 3. Moderate left atrial enlargement. 4. Trace mitral valve regurgitation. 5. The aortic valve is sclerotic with reduced excursion. 6. Moderate calcification of the aortic valve. 7. Mild aortic valve stenosis. 8. Mild aortic valve regurgitation. 9. Trivial tricuspid regurgitation. 10. Mildly elevated estimated right ventricular systolic pressure. 11. Right ventricular systolic pressure estimated at 35-40 mm Hg. 12. Trace of pulmonic valve regurgitation. 13. Analysis of mitral valve inflow, pulmonary vein Doppler and tissue Doppler suggests grade I diastolic dysfunction without elevated left atrial pressure.  ASSESSMENT/PLAN:   Atrial fibrillation with rapid ventricular response - now well controlled on IV diltiazem. Was on Dilt 240 long acting at home. Will likely convert tomorrow to 360 PO and continue low dose atenolol if able.   Chest pain - may be demand ischemia from RVR. Last NUC in May of 2012 reassuring. ETT in 11/12 also reassuring although decreased exercise tolerance (4:30) with SOB. Diastolic dysfunction.   Chronic  anticoagulation - prior retroperitoneal bleed on Lovenox. Now on coumadin. Monitored by Dr. Mayford Knife.   Hopeful d/c tomorrow if stable. Consider antiarrythmic to decrease chance of RVR again.    Donato Schultz, MD  11/13/2011  2:13 PM

## 2011-11-13 NOTE — ED Provider Notes (Signed)
Medical screening examination/treatment/procedure(s) were conducted as a shared visit with non-physician practitioner(s) and myself.  I personally evaluated the patient during the encounter   75yo F, c/o gradual onset and persistence of constant chest "pain" since this morning.  CP worsens on exertion.  Describes hx of similar pain, "and my heart was beating fast."  Has had admission previously for same.  Denies SOB, no palpitations.  Denies medication changes or medicine non-compliance.  Denies fevers, no back pain, no abd pain, no N/V/D.  +tachycardiac, afib with RVR rate 160's, lungs CTA, abd soft/NT, A&O, neuro non-focal, BP stable, afebrile.  IV cardizem bolus and gtt given with good effect.  HR now low 100's.  Admit to [facility].  CRITICAL CARE Performed by: Laray Anger Total critical care time: 31 Critical care time was exclusive of separately billable procedures and treating other patients. Critical care was necessary to treat or prevent imminent or life-threatening deterioration. Critical care was time spent personally by me on the following activities: development of treatment plan with patient and/or surrogate as well as nursing, discussions with consultants, evaluation of patient's response to treatment, examination of patient, obtaining history from patient or surrogate, ordering and performing treatments and interventions, ordering and review of laboratory studies, ordering and review of radiographic studies, pulse oximetry and re-evaluation of patient's condition.     Inmer Nix Allison Quarry, DO 11/13/11 1240

## 2011-11-13 NOTE — ED Notes (Signed)
Report called to 2000 unit. Receiving RN is off the floor.

## 2011-11-13 NOTE — Progress Notes (Signed)
PHARMACIST - PHYSICIAN ORDER COMMUNICATION  CONCERNING: P&T Medication Policy on Herbal Medications  DESCRIPTION:  This patient's order for:  Melatonin  has been noted.  This product(s) is classified as an "herbal" or natural product. Due to a lack of definitive safety studies or FDA approval, nonstandard manufacturing practices, plus the potential risk of unknown drug-drug interactions while on inpatient medications, the Pharmacy and Therapeutics Committee does not permit the use of "herbal" or natural products of this type within [facility].   ACTION TAKEN: Please reevaluate patient's clinical condition at discharge and address if the herbal or natural product(s) should be resumed at that time.  Hazelynn Mckenny K. Allena Katz, PharmD, BCPS.  Clinical Pharmacist Pager [phone]. 11/13/2011 2:16 PM

## 2011-11-13 NOTE — ED Notes (Signed)
Pt spouse at bedside

## 2011-11-13 NOTE — Progress Notes (Signed)
ANTICOAGULATION CONSULT NOTE - Initial Consult  Pharmacy Consult for Coumadin Indication: atrial fibrillation  Allergies  Allergen Reactions  . Crestor (Rosuvastatin Calcium) Other (See Comments)    cough    Patient Measurements: Height: 5\' 7"  (170.2 cm) Weight: 206 lb 2.1 oz (93.5 kg) IBW/kg (Calculated) : 61.6  Heparin Dosing Weight:   Vital Signs: Temp: 97.6 F (36.4 C) (01/26 1409) Temp src: Oral (01/26 1409) BP: 138/87 mmHg (01/26 1409) Pulse Rate: 93  (01/26 1409)  Labs:  Basename 11/13/11 1146 11/13/11 1122  HGB 14.6 14.9  HCT 43.0 42.4  PLT -- 237  APTT -- --  LABPROT -- 20.6*  INR -- 1.73*  HEPARINUNFRC -- --  CREATININE 1.00 --  CKTOTAL -- --  CKMB -- --  TROPONINI -- --   Estimated Creatinine Clearance: 58.8 ml/min (by C-G formula based on Cr of 1).  Medical History: Past Medical History  Diagnosis Date  . Carpal tunnel syndrome   . Hypertension   . Hypercholesteremia   . Cancer     lt breast    Medications:  Prescriptions prior to admission  Medication Sig Dispense Refill  . atenolol (TENORMIN) 25 MG tablet Take 25 mg by mouth daily.      Marland Kitchen atorvastatin (LIPITOR) 40 MG tablet Take 40 mg by mouth daily.        . Calcium Citrate-Vitamin D (CALCIUM CITRATE + PO) Take 1 tablet by mouth daily.      Marland Kitchen diltiazem (DILACOR XR) 240 MG 24 hr capsule Take 240 mg by mouth daily.      . Melatonin-Pyridoxine (MELATIN PO) Take 1 tablet by mouth at bedtime.      Marland Kitchen omeprazole (PRILOSEC) 40 MG capsule Take 40 mg by mouth daily.      Marland Kitchen OVER THE COUNTER MEDICATION Place 1 spray into the nose daily. Oil based nasal spray      . potassium chloride SA (K-DUR,KLOR-CON) 20 MEQ tablet Take 20 mEq by mouth daily.      Marland Kitchen telmisartan-hydrochlorothiazide (MICARDIS HCT) 80-12.5 MG per tablet Take 2 tablets by mouth daily.      . traMADol (ULTRAM) 50 MG tablet Take 50 mg by mouth every 6 (six) hours as needed. For pain      . warfarin (COUMADIN) 5 MG tablet Take 5-7.5 mg by  mouth. Takes 1 tablet (5mg ) 3 days week & 1.5 tablet (7.5mg ) 4 days a week.  Had 7.5mg  yesterday.        Assessment: Patient is a 76 y.o F on coumadin PTA for afib.  Admitted to the ED today with c/o CP and palpitation.  Per patient, last coumadin dose was taken last night around 9 PM.   Patient stated that she has been on coumadin for a while and is familiar with this medication. No additional questions at this time. (Home dose 7.5 mg daily except 5mg  on Tues and Fri)  Goal of Therapy:  INR 2-3   Plan:  1) Coumadin 10 mg x1 today (1.5 x home dose since INR is subtherapeutic) 2) daily PT/INR  Nolton Denis P 11/13/2011,2:52 PM

## 2011-11-13 NOTE — ED Notes (Signed)
NTG given x 1 by EMS with no relief.  Pain 2/10.  No ASA given because pt states her MD advises against it.

## 2011-11-13 NOTE — H&P (Addendum)
PCP:   Otilio Connors. Gerri Spore, M.D.  Cardiologist: Quintella Reichert, MD   DOA:  11/13/2011 10:54 AM  Chief Complaint:  Chest pain or palpitation.  HPI: 76 years old very pleasant Caucasian woman with history of atrial fibrillation presented to the ER today with chief complaint of  chest pain and palpitation started around 7 AM today. She describes her chest pain as burning/ sharp was about 3/10 this morning, currently resolved with nitroglycerin. Denies associated nausea, shortness of breath or diaphoresis. She stated that her chest pain worsened with exertion and improved with rest. In the ED EKG showed A. fib with RVR, troponin was negative x1. We were asked to admit for further management.  Allergies: Allergies  Allergen Reactions  . Crestor (Rosuvastatin Calcium) Other (See Comments)    cough    Prior to Admission medications   Medication Sig Start Date End Date Taking? Authorizing Provider  atenolol (TENORMIN) 25 MG tablet Take 25 mg by mouth daily.   Yes Historical Provider, MD  atorvastatin (LIPITOR) 40 MG tablet Take 40 mg by mouth daily.     Yes Historical Provider, MD  Calcium Citrate-Vitamin D (CALCIUM CITRATE + PO) Take 1 tablet by mouth daily.   Yes Historical Provider, MD  diltiazem (DILACOR XR) 240 MG 24 hr capsule Take 240 mg by mouth daily.   Yes Historical Provider, MD  Melatonin-Pyridoxine (MELATIN PO) Take 1 tablet by mouth at bedtime.   Yes Historical Provider, MD  omeprazole (PRILOSEC) 40 MG capsule Take 40 mg by mouth daily.   Yes Historical Provider, MD  OVER THE COUNTER MEDICATION Place 1 spray into the nose daily. Oil based nasal spray   Yes Historical Provider, MD  potassium chloride SA (K-DUR,KLOR-CON) 20 MEQ tablet Take 20 mEq by mouth daily.   Yes Historical Provider, MD  telmisartan-hydrochlorothiazide (MICARDIS HCT) 80-12.5 MG per tablet Take 2 tablets by mouth daily.   Yes Historical Provider, MD  traMADol (ULTRAM) 50 MG tablet Take 50 mg by mouth every 6  (six) hours as needed. For pain   Yes Historical Provider, MD  warfarin (COUMADIN) 5 MG tablet Take 5-7.5 mg by mouth. Takes 1 tablet (5mg ) 3 days week & 1.5 tablet (7.5mg ) 4 days a week.  Had 7.5mg  yesterday.   Yes Historical Provider, MD    Past Medical History  Diagnosis Date  . Carpal tunnel syndrome   . Hypertension   . Hypercholesteremia   . Cancer     lt breast  Atrial fibrillation History of retroperitoneal bleed in June of 2012.  Past Surgical History  Procedure Date  . Mastectomy 2004    LEFT  . Carpal tunnel release     right hand  . Appendectomy   . Abdominal hysterectomy     states "steralized"    Social History: Lives with husband, reports that she has quit smoking in 1996.  She reports that she drinks alcohol occasionally. She reports that she does not use illicit drugs.   Family History  Problem Relation Age of Onset  . Heart disease                  mother died of heart disease  Mother   . Heart disease                    father died of heart disease  Father     Review of Systems:  Constitutional: Denies fever, chills, diaphoresis, appetite change and fatigue.  HEENT: Denies photophobia, eye  pain, redness, hearing loss, ear pain, congestion, sore throat, rhinorrhea, sneezing, mouth sores, trouble swallowing, neck pain, neck stiffness and tinnitus.   Respiratory: Denies SOB, DOE, cough, chest tightness,  and wheezing.   Cardiovascular: C/O chest pain, palpitations . Denies leg swelling.  Gastrointestinal: Denies nausea, vomiting, abdominal pain, diarrhea, constipation, blood in stool and abdominal distention.  Genitourinary: Denies dysuria, urgency, frequency, hematuria, flank pain and difficulty urinating.  Musculoskeletal: Denies myalgias, back pain, joint swelling, arthralgias and gait problem. .  Neurological: Denies dizziness, seizures, syncope, weakness, light-headedness, numbness and headaches.     Physical Exam:  Filed Vitals:   11/13/11 1058  11/13/11 1128 11/13/11 1230 11/13/11 1257  BP: 121/73 123/61 108/65 101/59  Pulse: 105 130 100 95  Temp: 97.5 F (36.4 C) 97.5 F (36.4 C)  98.5 F (36.9 C)  TempSrc: Oral   Oral  Resp: 11 18 13    Height: 5' 7.72" (1.72 m)     Weight: 93.5 kg (206 lb 2.1 oz)     SpO2: 97% 96% 98% 98%    Constitutional: Vital signs reviewed.  Patient is a well-developed and well-nourished in no acute distress and cooperative with exam. Alert and oriented x3.  Head: Normocephalic and atraumatic Eyes: PERRL, EOMI, conjunctivae normal, No scleral icterus.  Neck: Supple, Trachea midline normal ROM, No JVD, mass, thyromegaly, or carotid bruit present.  Cardiovascular: Irregular irregular rate and rhythm, S1 normal, S2 normal, no MRG, pulses symmetric and intact bilaterally Pulmonary/Chest: CTAB, no wheezes, rales, or rhonchi Abdominal: Soft. Non-tender, non-distended, bowel sounds are normal, no masses, organomegaly, or guarding present.  Ext: no edema and no cyanosis, pulses palpable bilaterally   Neurological: A&O x3, Strenght is normal and symmetric bilaterally, cranial nerve II-XII are grossly intact, no focal motor deficit, sensory intact to light touch bilaterally.    Labs on Admission:  Results for orders placed during the hospital encounter of 11/13/11 (from the past 48 hour(s))  CBC     Status: Normal   Collection Time   11/13/11 11:22 AM      Component Value Range Comment   WBC 5.3  4.0 - 10.5 (K/uL)    RBC 4.64  3.87 - 5.11 (MIL/uL)    Hemoglobin 14.9  12.0 - 15.0 (g/dL)    HCT 16.1  09.6 - 04.5 (%)    MCV 91.4  78.0 - 100.0 (fL)    MCH 32.1  26.0 - 34.0 (pg)    MCHC 35.1  30.0 - 36.0 (g/dL)    RDW 40.9  81.1 - 91.4 (%)    Platelets 237  150 - 400 (K/uL)   DIFFERENTIAL     Status: Normal   Collection Time   11/13/11 11:22 AM      Component Value Range Comment   Neutrophils Relative 59  43 - 77 (%)    Neutro Abs 3.1  1.7 - 7.7 (K/uL)    Lymphocytes Relative 32  12 - 46 (%)    Lymphs  Abs 1.7  0.7 - 4.0 (K/uL)    Monocytes Relative 6  3 - 12 (%)    Monocytes Absolute 0.3  0.1 - 1.0 (K/uL)    Eosinophils Relative 2  0 - 5 (%)    Eosinophils Absolute 0.1  0.0 - 0.7 (K/uL)    Basophils Relative 0  0 - 1 (%)    Basophils Absolute 0.0  0.0 - 0.1 (K/uL)   PROTIME-INR     Status: Abnormal   Collection Time   11/13/11  11:22 AM      Component Value Range Comment   Prothrombin Time 20.6 (*) 11.6 - 15.2 (seconds)    INR 1.73 (*) 0.00 - 1.49    POCT I-STAT TROPONIN I     Status: Normal   Collection Time   11/13/11 11:44 AM      Component Value Range Comment   Troponin i, poc 0.02  0.00 - 0.08 (ng/mL)    Comment 3            POCT I-STAT, CHEM 8     Status: Abnormal   Collection Time   11/13/11 11:46 AM      Component Value Range Comment   Sodium 141  135 - 145 (mEq/L)    Potassium 4.1  3.5 - 5.1 (mEq/L)    Chloride 109  96 - 112 (mEq/L)    BUN 33 (*) 6 - 23 (mg/dL)    Creatinine, Ser 4.54  0.50 - 1.10 (mg/dL)    Glucose, Bld 098 (*) 70 - 99 (mg/dL)    Calcium, Ion 1.19  1.12 - 1.32 (mmol/L)    TCO2 24  0 - 100 (mmol/L)    Hemoglobin 14.6  12.0 - 15.0 (g/dL)    HCT 14.7  82.9 - 56.2 (%)     Radiological Exams on Admission: No results found.  Assessment/Plan Principal Problem:  *Atrial fibrillation with RVR Active Problems:  Chest pain  HTN (hypertension) CKD III Plan Will admit to telemetry, continue Cardizem drip and titrate to heart rate between 65 and 105. Continue atenolol and diltiazem by mouth. Continue Coumadin pharmacy to dose.Will give ASA,nitroglycerine SL PRN for chest pain. Cycle cardiac enzymes. Continue Lipitor Hold Micardis-HCT and gently hydrate. Dr. Anne Fu with [facility]  cardiology was consulted and he will  kindly see her in consultation. Patient is full code. Further plan of care as per patient's hospital progress and consultant input.   Time Spent on Admission: Approximately 50 minutes  Garvis Downum 11/13/2011, 1:48 PM

## 2011-11-13 NOTE — ED Provider Notes (Signed)
History     CSN: [id number]  Arrival date & time 11/13/11  1054   First MD Initiated Contact with Patient 11/13/11 1105      Chief Complaint  Patient presents with  . Chest Pain  . Atrial Fibrillation    (Consider location/radiation/quality/duration/timing/severity/associated sxs/prior treatment) Patient is a 76 y.o. female presenting with chest pain and atrial fibrillation. The history is provided by the patient.  Chest Pain The chest pain began 6 - 12 hours ago. Chest pain occurs constantly. The chest pain is unchanged. Associated with: She woke this morning with sensation of heart racingand a burning in her chest. She had some shortness of breath with exertion.  The pain does not radiate. Primary symptoms include shortness of breath and palpitations. Pertinent negatives for primary symptoms include no fever and no vomiting.  The palpitations also occurred with shortness of breath.  Associated symptoms comments: She has a history of atrial fibrillation and states that symptoms are similar. She denies history of known CAD. No recent medication changes. At this time, she is not having any pain. She is followed by Dr. Armanda Magic and her primary care is Dr. Gerri Spore..  Her past medical history is significant for arrhythmia, hyperlipidemia and hypertension.    Atrial Fibrillation Associated symptoms include chest pain. Pertinent negatives include no chills, fever or vomiting.    Past Medical History  Diagnosis Date  . Carpal tunnel syndrome   . Hypertension   . Hypercholesteremia   . Cancer     lt breast    Past Surgical History  Procedure Date  . Mastectomy 2004    LEFT  . Carpal tunnel release     right hand  . Appendectomy   . Abdominal hysterectomy     states "steralized"    Family History  Problem Relation Age of Onset  . Heart disease Mother   . Heart disease Father     History  Substance Use Topics  . Smoking status: Former Games developer  . Smokeless tobacco:  Not on file  . Alcohol Use: Yes     SOMETIMES    OB History    Grav Para Term Preterm Abortions TAB SAB Ect Mult Living                  Review of Systems  Constitutional: Negative for fever and chills.  HENT: Negative.   Respiratory: Positive for shortness of breath.   Cardiovascular: Positive for chest pain and palpitations.  Gastrointestinal: Negative.  Negative for vomiting.  Musculoskeletal: Negative.   Skin: Negative.   Neurological: Negative.     Allergies  Crestor  Home Medications   Current Outpatient Rx  Name Route Sig Dispense Refill  . ATENOLOL 25 MG PO TABS Oral Take 25 mg by mouth daily.    . ATORVASTATIN CALCIUM 40 MG PO TABS Oral Take 40 mg by mouth daily.      Marland Kitchen CALCIUM CITRATE + PO Oral Take 1 tablet by mouth daily.    Marland Kitchen DILTIAZEM HCL ER 240 MG PO CP24 Oral Take 240 mg by mouth daily.    Marland Kitchen MELATIN PO Oral Take 1 tablet by mouth at bedtime.    . OMEPRAZOLE 40 MG PO CPDR Oral Take 40 mg by mouth daily.    Marland Kitchen OVER THE COUNTER MEDICATION Nasal Place 1 spray into the nose daily. Oil based nasal spray    . POTASSIUM CHLORIDE CRYS ER 20 MEQ PO TBCR Oral Take 20 mEq by mouth daily.    Marland Kitchen  TELMISARTAN-HCTZ 80-12.5 MG PO TABS Oral Take 2 tablets by mouth daily.    . TRAMADOL HCL 50 MG PO TABS Oral Take 50 mg by mouth every 6 (six) hours as needed. For pain    . WARFARIN SODIUM 5 MG PO TABS Oral Take 5-7.5 mg by mouth. Takes 1 tablet (5mg ) 3 days week & 1.5 tablet (7.5mg ) 4 days a week.  Had 7.5mg  yesterday.      BP 101/59  Pulse 95  Temp(Src) 98.5 F (36.9 C) (Oral)  Resp 13  Ht 5' 7.72" (1.72 m)  Wt 206 lb 2.1 oz (93.5 kg)  BMI 31.60 kg/m2  SpO2 98%  Physical Exam  Constitutional: She appears well-developed and well-nourished.  HENT:  Head: Normocephalic.  Neck: Normal range of motion. Neck supple.  Cardiovascular: An irregular rhythm present. Tachycardia present.   No murmur heard. Pulmonary/Chest: Effort normal and breath sounds normal.  Abdominal:  Soft. Bowel sounds are normal. There is no tenderness. There is no rebound and no guarding.  Musculoskeletal: Normal range of motion.  Neurological: She is alert. No cranial nerve deficit.  Skin: Skin is warm and dry. No rash noted.  Psychiatric: She has a normal mood and affect.    ED Course  Procedures (including critical care time)  Labs Reviewed  PROTIME-INR - Abnormal; Notable for the following:    Prothrombin Time 20.6 (*)    INR 1.73 (*)    All other components within normal limits  POCT I-STAT, CHEM 8 - Abnormal; Notable for the following:    BUN 33 (*)    Glucose, Bld 123 (*)    All other components within normal limits  CBC  DIFFERENTIAL  POCT I-STAT TROPONIN I  I-STAT TROPONIN I  I-STAT, CHEM 8   No results found.   1. Atrial fibrillation with rapid ventricular response       MDM          Rodena Medin, PA-C 11/13/11 1355

## 2011-11-13 NOTE — ED Notes (Signed)
Received Report from Earlean Polka, RN

## 2011-11-13 NOTE — ED Notes (Signed)
Pt began having CP at 0700 today.  Afib on monitor - know Afib since last year.  Takes coumadin.

## 2011-11-14 DIAGNOSIS — I4891 Unspecified atrial fibrillation: Secondary | ICD-10-CM

## 2011-11-14 LAB — BASIC METABOLIC PANEL
BUN: 29 mg/dL — ABNORMAL HIGH (ref 6–23)
Calcium: 9.8 mg/dL (ref 8.4–10.5)
GFR calc non Af Amer: 55 mL/min — ABNORMAL LOW (ref >90)
Glucose, Bld: 104 mg/dL — ABNORMAL HIGH (ref 70–99)

## 2011-11-14 MED ORDER — WARFARIN SODIUM 5 MG PO TABS: 5.0000 mg | ORAL_TABLET | Freq: Every day | ORAL | Status: DC

## 2011-11-14 MED ORDER — DILTIAZEM HCL ER COATED BEADS 360 MG PO CP24
360.0000 mg | ORAL_CAPSULE | Freq: Every day | ORAL | Status: DC
  Administered 2011-11-14: 360 mg via ORAL
  Filled 2011-11-14: qty 1

## 2011-11-14 MED ORDER — DILTIAZEM HCL ER COATED BEADS 360 MG PO CP24: 360.0000 mg | ORAL_CAPSULE | Freq: Every day | ORAL | Status: DC

## 2011-11-14 MED ORDER — WARFARIN SODIUM 10 MG PO TABS
10.0000 mg | ORAL_TABLET | Freq: Once | ORAL | Status: DC
  Filled 2011-11-14: qty 1

## 2011-11-14 NOTE — Progress Notes (Signed)
Agree with Dr. Fabio Bering note.  Spoke to Dr. Cleotis Lema. Given her prior RP bleed on Lovenox, I would advocate increasing her coumadin dose to 10mg  tonight and see Alfonse Ras in the am. She has had no prior history of CVA. She is very eager for discharge and understands the possible risk of CVA with subtheraputic INR.  Once INR theraputic, Dr. Mayford Knife may wish to admit for sotalol.  Mod LVH (flecainide would not be wise)  OK for dc today.

## 2011-11-14 NOTE — Discharge Summary (Signed)
Patient ID: KATHYE CIPRIANI MRN: [id number] DOB/AGE: 01-12-1936 76 y.o.  Admit date: 11/13/2011 Discharge date: 11/14/2011  Primary Care Physician:  Quintella Reichert, MD, MD   Discharge Diagnoses:    Present on Admission:  .Atrial fibrillation with RVR .Chest pain .HTN (hypertension)  Medication List  As of 11/14/2011  1:42 PM   STOP taking these medications         ATENOLOL PO      diltiazem 240 MG 24 hr capsule      potassium chloride SA 20 MEQ tablet      telmisartan-hydrochlorothiazide 80-12.5 MG per tablet         TAKE these medications         atenolol 25 MG tablet   Commonly known as: TENORMIN   Take 25 mg by mouth daily.      atorvastatin 40 MG tablet   Commonly known as: LIPITOR   Take 40 mg by mouth daily.      CALCIUM CITRATE + PO   Take 1 tablet by mouth daily.      diltiazem 360 MG 24 hr capsule   Commonly known as: CARDIZEM CD   Take 1 capsule (360 mg total) by mouth daily.      MELATIN PO   Take 1 tablet by mouth at bedtime.      omeprazole 40 MG capsule   Commonly known as: PRILOSEC   Take 40 mg by mouth daily.      OVER THE COUNTER MEDICATION   Place 1 spray into the nose daily. Oil based nasal spray      traMADol 50 MG tablet   Commonly known as: ULTRAM   Take 50 mg by mouth every 6 (six) hours as needed. For pain      warfarin 5 MG tablet   Commonly known as: COUMADIN   Take 1-1.5 tablets (5-7.5 mg total) by mouth daily. 7.5mg  daily except 5mg  on Tuesday and Friday             Consults:  Dr Loraine Leriche [facility]  Cardiology    Significant Diagnostic Studies:  No results found.  Brief H and P: For complete details please refer to admission H and P, but in brief 76 years old very pleasant Caucasian woman with history of atrial fibrillation presented to the ER today with chief complaint of chest pain and palpitation started around 7 AM today. She describes her chest pain as burning/ sharp was about 3/10 this morning, currently  resolved with nitroglycerin. Denies associated nausea, shortness of breath or diaphoresis. She stated that her chest pain worsened with exertion and improved with rest. In the ED EKG showed A. fib with RVR, troponin was negative x1. We were asked to admit for further management.   Hospital Course:  Principal Problem:  *Atrial fibrillation with RVR Patient was treated with cardizem drip ,HR improved to 70s ,she was then weaned off the drip .Discussed with Dr Anne Fu ,will increase diltiazem to 360 mg and continue Atenolol.INR is sub therapeutic ,patient was advised to take 10 mg of coumadin tonight ,follow with cardiology in the AM for further adjustment of coumadin dose.will not bridge with Lovenox because of prior episode of retroperitoneal bleed with Lovenox . Active Problems:  Chest pain Was felt to be due to demand ischemia ,troponis were negative.  HTN (hypertension) Fair ,she will continue with Atenolol and diltiazem ,Telmisartan HCT was held and patient was gently hydrated because of elevated BUN/creatinine ratio which can imply prerenal azotemia.SBP in  the 140s,however diltiazem dose was increased as above .Patient advised to follow with her cardiologist/PCP for further adjustment of her antihypertensive regimen.  Subjective: Patient seen and examined ,denies any complaints    Filed Vitals:   11/14/11 0445  BP: 146/78  Pulse: 70  Temp: 98.5 F (36.9 C)  Resp: 18    General: Alert, awake, oriented x3, in no acute distress. HEENT: No bruits, no goiter. Heart: irregular irregular rate and rhythm, without murmurs, rubs, gallops. Lungs: Clear to auscultation bilaterally. Abdomen: Soft, nontender, nondistended, positive bowel sounds. Extremities: No clubbing cyanosis or edema with positive pedal pulses. Neuro: Grossly intact, nonfocal.   Disposition and Follow-up:  Home  Follow with Alfonse Ras in the AM  Follow with Dr Mayford Knife and PCP    Time spent on Discharge: 40 MINUTES     Signed: Baltazar Najjar 11/14/2011, 1:42 PM

## 2011-11-14 NOTE — Progress Notes (Signed)
ANTICOAGULATION CONSULT NOTE - Initial Consult  Pharmacy Consult for Coumadin Indication: atrial fibrillation  Allergies  Allergen Reactions  . Crestor (Rosuvastatin Calcium) Other (See Comments)    cough    Patient Measurements: Height: 5\' 7"  (170.2 cm) Weight: 206 lb 2.1 oz (93.5 kg) IBW/kg (Calculated) : 61.6  Heparin Dosing Weight:   Vital Signs: Temp: 98.5 F (36.9 C) (01/27 0445) Temp src: Oral (01/27 0445) BP: 146/78 mmHg (01/27 0445) Pulse Rate: 70  (01/27 0445)  Labs:  Basename 11/14/11 0500 11/13/11 2157 11/13/11 1418 11/13/11 1146 11/13/11 1122  HGB -- -- -- 14.6 14.9  HCT -- -- -- 43.0 42.4  PLT -- -- -- -- 237  APTT -- -- -- -- --  LABPROT 19.8* -- -- -- 20.6*  INR 1.65* -- -- -- 1.73*  HEPARINUNFRC -- -- -- -- --  CREATININE 0.99 -- -- 1.00 --  CKTOTAL -- 199* 214* -- --  CKMB -- 6.6* 6.9* -- --  TROPONINI -- <0.30 <0.30 -- --   Estimated Creatinine Clearance: 59.4 ml/min (by C-G formula based on Cr of 0.99).  Medical History: Past Medical History  Diagnosis Date  . Carpal tunnel syndrome   . Hypertension   . Hypercholesteremia   . Cancer     lt breast    Medications:  Prescriptions prior to admission  Medication Sig Dispense Refill  . atenolol (TENORMIN) 25 MG tablet Take 25 mg by mouth daily.      Marland Kitchen atorvastatin (LIPITOR) 40 MG tablet Take 40 mg by mouth daily.        . Calcium Citrate-Vitamin D (CALCIUM CITRATE + PO) Take 1 tablet by mouth daily.      Marland Kitchen diltiazem (DILACOR XR) 240 MG 24 hr capsule Take 240 mg by mouth daily.      . Melatonin-Pyridoxine (MELATIN PO) Take 1 tablet by mouth at bedtime.      Marland Kitchen omeprazole (PRILOSEC) 40 MG capsule Take 40 mg by mouth daily.      Marland Kitchen OVER THE COUNTER MEDICATION Place 1 spray into the nose daily. Oil based nasal spray      . potassium chloride SA (K-DUR,KLOR-CON) 20 MEQ tablet Take 20 mEq by mouth daily.      Marland Kitchen telmisartan-hydrochlorothiazide (MICARDIS HCT) 80-12.5 MG per tablet Take 2 tablets by  mouth daily.      . traMADol (ULTRAM) 50 MG tablet Take 50 mg by mouth every 6 (six) hours as needed. For pain      . warfarin (COUMADIN) 5 MG tablet Take 5-7.5 mg by mouth daily. 7.5mg  daily except 5mg  on Tuesday and Friday        Assessment: Patient is a 76 y.o F on coumadin PTA for afib.  Admitted to the ED today with c/o CP and palpitation.  Per patient, last coumadin dose was taken last night around 9 PM.   Patient stated that she has been on coumadin for a while and is familiar with this medication. No additional questions at this time. (Home dose 7.5 mg daily except 5mg  on Tues and Fri). INR lower after 10mg  yesterday.   Goal of Therapy:  INR 2-3   Plan:  1) Coumadin 10 mg x1 today 2) daily PT/INR  Ulyses Southward [place] 11/14/2011,10:15 AM

## 2011-11-14 NOTE — Progress Notes (Signed)
Patient ID: [name], female   DOB: 1936-09-11, 76 y.o.   MRN: [id number] @ Subjective:  Denies SSCP, palpitations or Dyspnea   Objective:  Filed Vitals:   11/13/11 1257 11/13/11 1409 11/13/11 2038 11/14/11 0445  BP: 101/59 138/87 147/71 146/78  Pulse: 95 93 76 70  Temp: 98.5 F (36.9 C) 97.6 F (36.4 C) 98.7 F (37.1 C) 98.5 F (36.9 C)  TempSrc: Oral Oral Oral Oral  Resp:  16 17 18   Height:  5\' 7"  (1.702 m)    Weight:  93.5 kg (206 lb 2.1 oz)    SpO2: 98% 95% 93% 94%    Intake/Output from previous day:  Intake/Output Summary (Last 24 hours) at 11/14/11 0817 Last data filed at 11/14/11 0600  Gross per 24 hour  Intake    302 ml  Output    500 ml  Net   -198 ml    Physical Exam: Affect appropriate Healthy:  appears stated age HEENT: normal Neck supple with no adenopathy JVP normal no bruits no thyromegaly Lungs clear with no wheezing and good diaphragmatic motion Heart:  S1/S2 no murmur, no rub, gallop or click PMI normal Abdomen: benighn, BS positve, no tenderness, no AAA no bruit.  No HSM or HJR Distal pulses intact with no bruits No edema Neuro non-focal Skin warm and dry No muscular weakness   Lab Results: Basic Metabolic Panel:  Basename 11/14/11 0500 11/13/11 1146  NA 137 141  K 4.8 4.1  CL 102 109  CO2 23 --  GLUCOSE 104* 123*  BUN 29* 33*  CREATININE 0.99 1.00  CALCIUM 9.8 --  MG -- --  PHOS -- --   Liver Function Tests: No results found for this basename: AST:2,ALT:2,ALKPHOS:2,BILITOT:2,PROT:2,ALBUMIN:2 in the last 72 hours No results found for this basename: LIPASE:2,AMYLASE:2 in the last 72 hours CBC:  Basename 11/13/11 1146 11/13/11 1122  WBC -- 5.3  NEUTROABS -- 3.1  HGB 14.6 14.9  HCT 43.0 42.4  MCV -- 91.4  PLT -- 237   Cardiac Enzymes:  Basename 11/13/11 2157 11/13/11 1418  CKTOTAL 199* 214*  CKMB 6.6* 6.9*  CKMBINDEX -- --  TROPONINI <0.30 <0.30    Imaging: No results found.  Cardiac Studies:  ECG:    Orders placed during the hospital encounter of 03/20/11  . EKG     Telemetry: Afib rate improved  Echo:   Medications:     . aspirin EC  325 mg Oral Daily  . atenolol  25 mg Oral Daily  . atorvastatin  40 mg Oral Daily  . calcium citrate  200 mg of elemental calcium Oral Daily  . diltiazem (CARDIZEM) infusion  5 mg/hr Intravenous Once  . diltiazem  240 mg Oral Daily  . pantoprazole  80 mg Oral Q1200  . warfarin  10 mg Oral ONCE-1800  . DISCONTD: Calcium Citrate +  1 tablet Oral Daily  . DISCONTD: Melatonin-Pyridoxine  1 tablet Per post-pyloric tube QHS  . DISCONTD: NON FORMULARY 1 tablet  1 tablet Oral Daily       . diltiazem (CARDIZEM) infusion 10 mg/hr (11/13/11 2155)  . DISCONTD: sodium chloride      Assessment/Plan:  AFib:  Would not start antiarrythmic with subRx INR.  Change to PO cardizem.  Likely D/C latter today.  When INR been Rx consider sotolol She has moderate LVH and flecainide would not be wise HTN:  Well controlled Chol:  Continue statin  Charlton Haws 11/14/2011, 8:17 AM

## 2011-11-14 NOTE — Progress Notes (Signed)
Pt. Discharged 11/14/2011  2:25 PM Discharge instructions reviewed with patient/family. Patient/family verbalized understanding. All Rx's given. Questions answered as needed. Pt. Discharged to home with family/self. Taken off unit via W/C. Noah Charon

## 2011-11-14 NOTE — ED Provider Notes (Signed)
Medical screening examination/treatment/procedure(s) were conducted as a shared visit with non-physician practitioner(s) and myself.  I personally evaluated the patient during the encounter   Jeven Topper M Nealy Hickmon, DO 11/14/11 0747 

## 2011-11-15 ENCOUNTER — Encounter (INDEPENDENT_AMBULATORY_CARE_PROVIDER_SITE_OTHER): Payer: Self-pay | Admitting: General Surgery

## 2011-11-15 DIAGNOSIS — Z853 Personal history of malignant neoplasm of breast: Secondary | ICD-10-CM | POA: Insufficient documentation

## 2011-11-15 NOTE — Progress Notes (Signed)
Utilization Review Completed.[name] T1/28/2013   

## 2011-11-16 ENCOUNTER — Encounter (INDEPENDENT_AMBULATORY_CARE_PROVIDER_SITE_OTHER): Payer: Self-pay | Admitting: Surgery

## 2011-11-16 ENCOUNTER — Ambulatory Visit (INDEPENDENT_AMBULATORY_CARE_PROVIDER_SITE_OTHER): Payer: BC Managed Care – PPO | Admitting: Surgery

## 2011-11-16 VITALS — BP 126/78 | HR 70 | Temp 96.9°F | Resp 18 | Ht 67.0 in | Wt 208.8 lb

## 2011-11-16 DIAGNOSIS — Z853 Personal history of malignant neoplasm of breast: Secondary | ICD-10-CM

## 2011-11-16 NOTE — Progress Notes (Signed)
NAME: [name]       DOB: [birth year]           DATE: 11/16/2011       MRN: [id number]   [name] is a 76 y.o.Marland Kitchenfemale who presents for routine followup of her Left breast cancer, multifocal ILC diagnosed in 2004 and treated with mastectomy and antiestrogen. She has no problems or concerns on either side.  PFSH: She has had no significant changes since the last visit here, except for the development of AF  ROS: There have been no significant changes since the last visit here  EXAM: General: The patient is alert, oriented, generally healty appearing, NAD. Mood and affect are normal.  Breasts:  Right breast OK, s/p reductioin - remains firm. Left s/p mastectomy and reconstruction with no evidence of recurrence  Lymphatics: She has no axillary or supraclavicular adenopathy on either side.  Extremities: Full ROM of the surgical side with no lymphedema noted.  Data Reviewed: Mammogram last year was OK  Impression: Doing well, with no evidence of recurrent cancer or new cancer  Plan: RTC PRN

## 2011-12-08 ENCOUNTER — Other Ambulatory Visit: Payer: Self-pay | Admitting: Internal Medicine

## 2011-12-14 ENCOUNTER — Other Ambulatory Visit: Payer: Self-pay | Admitting: Orthopedic Surgery

## 2011-12-15 ENCOUNTER — Encounter (HOSPITAL_COMMUNITY): Payer: Self-pay | Admitting: Pharmacy Technician

## 2011-12-16 ENCOUNTER — Encounter (HOSPITAL_COMMUNITY): Payer: Self-pay

## 2011-12-16 ENCOUNTER — Encounter (HOSPITAL_COMMUNITY)
Admission: RE | Admit: 2011-12-16 | Discharge: 2011-12-16 | Disposition: A | Discharge status: Home / Self Care | Payer: BC Managed Care – PPO | Source: Ambulatory Visit | Attending: Orthopedic Surgery | Admitting: Orthopedic Surgery

## 2011-12-16 NOTE — Progress Notes (Signed)
Telephone call to Dr Mayford Knife re: coumadin d/c prior to surgery 12/21/2011 for shoulder replacement. Dr Mayford Knife states pt will not be able to have surgery for several months due to AFib; needs to have cardioversion prior to shoulder replacement.Dr Mayford Knife will call Dr Ave Filter. Patient advised of Dr Norris Cross decision and pt will call her office for further instructions. The patient does not seem upset and states she probably will wait until the fall to have her surgery.

## 2011-12-21 ENCOUNTER — Encounter (HOSPITAL_COMMUNITY): Admission: RE | Payer: Self-pay | Source: Ambulatory Visit

## 2011-12-21 ENCOUNTER — Ambulatory Visit (HOSPITAL_COMMUNITY)
Admission: RE | Admit: 2011-12-21 | Payer: BC Managed Care – PPO | Source: Ambulatory Visit | Admitting: Orthopedic Surgery

## 2011-12-21 SURGERY — ARTHROPLASTY, SHOULDER, TOTAL
Anesthesia: Choice | Laterality: Right

## 2012-01-11 ENCOUNTER — Encounter: Payer: Self-pay | Admitting: Cardiology

## 2012-01-11 ENCOUNTER — Other Ambulatory Visit: Payer: Self-pay | Admitting: Cardiology

## 2012-01-11 NOTE — H&P (Signed)
Office Visit     Patient: Jacqueline, Wise Provider: Armanda Magic, MD  DOB: 10-23-1935 Age: 76 Y Sex: Female Date: 01/10/2012  Phone: [phone]   Address: [address] [place], [id number]  Pcp: Carolin Coy       Subjective:     CC:    1. PER DR WESTERMAN FOLLOWUP CP WITH EXERTION.        HPI:  General:  The patient presents today with complaints of chest pain. It has only happened 1 time. She says that she got up and went to walk on the treadmill for 15 minutes and then got a burning sensation in her chest and she stopped and rested. The burning went away. Of note she had not eaten yet that morning. She has not had any further burning but has not walked on the treadmill. She was seen by Dr. Gerri Spore who added Atenolol. She was mildly SOB during the burning and also noticed that her heart rate was above 80bpm. She says when her heart rate gets above 80bpm she gets SOB. .        ROS:  See HPI, A twelve system review was perfomed at today's visit. For pertinent positives and negatives see HPI.       Medical History: Hypertension, Hypercholesterolemia, Menopause, Arthritis fingers, Breast cancer multifocal, Atrial fibrillation, retroperitoneal bleed from Lovenox.        Family History:        Social History:  General:  History of smoking cigarettes: Former smoker, Quit in year 1968.  no Smoking.  Alcohol: yes, occasionally, wine.  Caffeine: yes, coffee, 1 serving daily.  no Recreational drug use.  Exercise: yes, walks, golf, 2-3 x weekly.  Occupation: unemployed, retired.  Marital Status: married.  Children: 5 children.  Seat belt use: yes.        Medications: Melatonin 1 MG Capsule 1 tablet once a day, Glucosamine 500 MG Capsule 2 tablets Once a day, Tylenol 500 mg Tablet 2 tabs as needed, Prilosec OTC 20 MG Tablet Delayed Release 1 tablet Once daily, Calcium Citrate Tablet 1 tablet once a day, Tramadol HCl 50 MG Tablet 1 tablet as needed for pain  every 6 hrs, Zolpidem Tartrate 10 MG Tablet 1 tablet at bedtime Once a day, Lipitor 40 MG Tablet 1 tablet Orally daily once a day, Nitroglycerin 0.4 mg 0.4 mg tablet 1 tablet as directed as directed prn chest pain, Diltiazem HCl Coated Beads 360 MG Capsule Extended Release 24 Hour 1 capsule Once a day, Atenolol 25 MG Tablet 1 tablet Once a day, Warfarin Sodium 5 MG Tablet 1 and 1/2 tablets once a day, Medication List reviewed and reconciled with the patient       Allergies: Crestor: leg cramps/nausea: Allergy, Adhesive Bandages: rash: Allergy.       Objective:     Vitals: Wt 200, Wt change 1 lb, Ht 67.25, BMI 31.09, Pulse sitting 68, BP sitting 130/70.       Examination:  Cardiology, General:  GENERAL APPEARANCE: pleasant, NAD.  HEENT: unremarkable.  CAROTID UPSTROKE: normal, no bruit.  JVD: flat.  HEART SOUNDS: regular, normal S1, S2, no S3 or S4.  MURMUR: absent.  LUNGS: no rales or wheezes.  ABDOMEN: soft, non tender, positive bowel sounds, no masses felt.  EXTREMITIES: no leg edema.  PERIPHERAL PULSES: 2 plus bilateral.        Assessment:     Assessment:  1. Chest pain - 786.50 (Primary)  2. Atrial fibrillation - 427.31  3. Admission  for long-term (current) use of anticoagulants - V58.61  4. Essential hypertension, benign - 401.1    Plan:     1. Chest pain  Diagnostic Imaging:EC Stress Test [facility] (Ordered for 01/17/2012)  I have recommended that we proceed with nuclear stress test to rule out ischemia due to episodes of exertional chest pain.       2. Atrial fibrillation Continue Diltiazem HCl Coated Beads Capsule Extended Release 24 Hour, 360 MG, 1 capsule, Orally, Once a day ; Continue Atenolol Tablet, 25 MG, 1 tablet, Orally, Once a day ; Continue Warfarin Sodium Tablet, 5 MG, 1 and 1/2 tablets, Orally, once a day .  Diagnostic Imaging:EKG atrial fibrillation, Redmond Baseman 01/10/2012 11:16:37 AM > Ansen Sayegh M 01/10/2012 11:26:42 AM >  She is back in  atrial fibrillation today and I suspect her symptoms were related to converting back to afib. I have recommended that we bring her in to the hospital for drug loading with Sotolol.       3. Admission for long-term (current) use of anticoagulants  She would like to switch from Coumadin to Xarelto so she will be seen in our coumadin clinic today.       4. Essential hypertension, benign Continue Diltiazem HCl Coated Beads Capsule Extended Release 24 Hour, 360 MG, 1 capsule, Orally, Once a day ; Continue Atenolol Tablet, 25 MG, 1 tablet, Orally, Once a day .        Immunizations:        Labs:        Procedure Codes: 40981 EKG I AND R       Preventive:        Provider: Armanda Magic, MD  Patient: [name] DOB: May 23, 1936 Date: 01/10/2012

## 2012-01-24 ENCOUNTER — Other Ambulatory Visit: Payer: Self-pay

## 2012-01-24 ENCOUNTER — Inpatient Hospital Stay (HOSPITAL_COMMUNITY)
Admission: AD | Admit: 2012-01-24 | Discharge: 2012-01-26 | DRG: 139 | Disposition: A | Discharge status: Home / Self Care | Payer: BC Managed Care – PPO | Source: Ambulatory Visit | Attending: Cardiology | Admitting: Cardiology

## 2012-01-24 ENCOUNTER — Other Ambulatory Visit (HOSPITAL_COMMUNITY): Payer: Self-pay | Admitting: Family Medicine

## 2012-01-24 ENCOUNTER — Encounter (HOSPITAL_COMMUNITY): Payer: Self-pay | Admitting: *Deleted

## 2012-01-24 ENCOUNTER — Inpatient Hospital Stay (HOSPITAL_COMMUNITY): Payer: BC Managed Care – PPO

## 2012-01-24 DIAGNOSIS — C50919 Malignant neoplasm of unspecified site of unspecified female breast: Secondary | ICD-10-CM | POA: Diagnosis present

## 2012-01-24 DIAGNOSIS — E785 Hyperlipidemia, unspecified: Secondary | ICD-10-CM | POA: Diagnosis present

## 2012-01-24 DIAGNOSIS — Z1231 Encounter for screening mammogram for malignant neoplasm of breast: Secondary | ICD-10-CM

## 2012-01-24 DIAGNOSIS — Z7901 Long term (current) use of anticoagulants: Secondary | ICD-10-CM

## 2012-01-24 DIAGNOSIS — I10 Essential (primary) hypertension: Secondary | ICD-10-CM | POA: Diagnosis present

## 2012-01-24 DIAGNOSIS — M19049 Primary osteoarthritis, unspecified hand: Secondary | ICD-10-CM | POA: Diagnosis present

## 2012-01-24 DIAGNOSIS — R079 Chest pain, unspecified: Secondary | ICD-10-CM | POA: Diagnosis present

## 2012-01-24 DIAGNOSIS — I4891 Unspecified atrial fibrillation: Secondary | ICD-10-CM

## 2012-01-24 LAB — DIFFERENTIAL
Basophils Relative: 1 % (ref 0–1)
Eosinophils Relative: 2 % (ref 0–5)
Lymphs Abs: 1.9 10*3/uL (ref 0.7–4.0)
Monocytes Absolute: 0.5 10*3/uL (ref 0.1–1.0)
Neutrophils Relative %: 62 % (ref 43–77)

## 2012-01-24 LAB — COMPREHENSIVE METABOLIC PANEL
ALT: 24 U/L (ref 0–35)
AST: 25 U/L (ref 0–37)
Albumin: 4.1 g/dL (ref 3.5–5.2)
Alkaline Phosphatase: 90 U/L (ref 39–117)
Potassium: 4.4 mEq/L (ref 3.5–5.1)
Sodium: 142 mEq/L (ref 135–145)
Total Protein: 7.4 g/dL (ref 6.0–8.3)

## 2012-01-24 LAB — PROTIME-INR: INR: 1.66 — ABNORMAL HIGH (ref 0.00–1.49)

## 2012-01-24 LAB — CBC
MCHC: 35.1 g/dL (ref 30.0–36.0)
RDW: 12.9 % (ref 11.5–15.5)

## 2012-01-24 LAB — MAGNESIUM: Magnesium: 1.9 mg/dL (ref 1.5–2.5)

## 2012-01-24 LAB — TSH: TSH: 1.57 u[IU]/mL (ref 0.350–4.500)

## 2012-01-24 LAB — APTT: aPTT: 37 seconds (ref 24–37)

## 2012-01-24 MED ORDER — ATORVASTATIN CALCIUM 40 MG PO TABS
40.0000 mg | ORAL_TABLET | Freq: Every day | ORAL | Status: DC
  Administered 2012-01-25 – 2012-01-26 (×2): 40 mg via ORAL
  Filled 2012-01-24 (×2): qty 1

## 2012-01-24 MED ORDER — SODIUM CHLORIDE 0.9 % IJ SOLN: 3.0000 mL | INTRAMUSCULAR | Status: DC | PRN

## 2012-01-24 MED ORDER — DIPHENHYDRAMINE HCL 25 MG PO CAPS: 50.0000 mg | ORAL_CAPSULE | Freq: Every evening | ORAL | Status: DC | PRN

## 2012-01-24 MED ORDER — TRAMADOL HCL 50 MG PO TABS: 50.0000 mg | ORAL_TABLET | Freq: Four times a day (QID) | ORAL | Status: DC | PRN

## 2012-01-24 MED ORDER — SOTALOL HCL 80 MG PO TABS
80.0000 mg | ORAL_TABLET | Freq: Two times a day (BID) | ORAL | Status: DC
  Administered 2012-01-24 – 2012-01-26 (×5): 80 mg via ORAL
  Filled 2012-01-24 (×7): qty 1

## 2012-01-24 MED ORDER — SODIUM CHLORIDE 0.9 % IJ SOLN: 3.0000 mL | Freq: Two times a day (BID) | INTRAMUSCULAR | Status: DC

## 2012-01-24 MED ORDER — DIPHENHYDRAMINE-APAP (SLEEP) 25-500 MG PO TABS: 2.0000 | ORAL_TABLET | Freq: Every evening | ORAL | Status: DC | PRN

## 2012-01-24 MED ORDER — RIVAROXABAN 10 MG PO TABS
20.0000 mg | ORAL_TABLET | Freq: Every evening | ORAL | Status: DC
  Administered 2012-01-24 – 2012-01-26 (×3): 20 mg via ORAL
  Filled 2012-01-24 (×4): qty 2

## 2012-01-24 MED ORDER — GLUCOSAMINE 500 MG PO TABS: 1.0000 | ORAL_TABLET | Freq: Two times a day (BID) | ORAL | Status: DC

## 2012-01-24 MED ORDER — SODIUM CHLORIDE 0.9 % IV SOLN: 250.0000 mL | INTRAVENOUS | Status: DC | PRN

## 2012-01-24 MED ORDER — DILTIAZEM HCL ER COATED BEADS 360 MG PO CP24
360.0000 mg | ORAL_CAPSULE | Freq: Every day | ORAL | Status: DC
  Administered 2012-01-25 – 2012-01-26 (×2): 360 mg via ORAL
  Filled 2012-01-24 (×2): qty 1

## 2012-01-24 MED ORDER — ACETAMINOPHEN 500 MG PO TABS
500.0000 mg | ORAL_TABLET | Freq: Every day | ORAL | Status: DC
  Administered 2012-01-25 – 2012-01-26 (×2): 500 mg via ORAL
  Filled 2012-01-24 (×2): qty 1

## 2012-01-24 MED ORDER — ZOLPIDEM TARTRATE 5 MG PO TABS
5.0000 mg | ORAL_TABLET | Freq: Every evening | ORAL | Status: DC | PRN
  Administered 2012-01-24 – 2012-01-25 (×2): 5 mg via ORAL
  Filled 2012-01-24 (×2): qty 1

## 2012-01-24 MED ORDER — ACETAMINOPHEN 500 MG PO TABS
1000.0000 mg | ORAL_TABLET | Freq: Every evening | ORAL | Status: DC | PRN
  Administered 2012-01-24 – 2012-01-25 (×2): 1000 mg via ORAL
  Filled 2012-01-24 (×2): qty 2

## 2012-01-24 MED ORDER — PANTOPRAZOLE SODIUM 40 MG PO TBEC
40.0000 mg | DELAYED_RELEASE_TABLET | Freq: Every day | ORAL | Status: DC
  Administered 2012-01-25 – 2012-01-26 (×2): 40 mg via ORAL
  Filled 2012-01-24 (×2): qty 1

## 2012-01-24 MED ORDER — ATENOLOL 25 MG PO TABS
25.0000 mg | ORAL_TABLET | Freq: Every day | ORAL | Status: DC
  Administered 2012-01-25 – 2012-01-26 (×2): 25 mg via ORAL
  Filled 2012-01-24 (×2): qty 1

## 2012-01-24 NOTE — H&P (Signed)
Please refer to full H&P as outlined in noted dated 01/11/2012  Patient underwent nuclear stress test for chest pain which is normal with no ischemia.  She is now being admitted for drug loading with Sotolol for PAF>

## 2012-01-24 NOTE — Progress Notes (Signed)
EKG done prior to first dose of Betapace pt's qtc was 398/456. Two hours later at 2219 pt's qtc was 400/444. Pt tolerated well. Will continue to monitor the pt. Sanda Linger

## 2012-01-25 LAB — BASIC METABOLIC PANEL
CO2: 23 mEq/L (ref 19–32)
Chloride: 105 mEq/L (ref 96–112)
Creatinine, Ser: 0.81 mg/dL (ref 0.50–1.10)

## 2012-01-25 MED ORDER — CLONIDINE HCL 0.1 MG PO TABS
0.1000 mg | ORAL_TABLET | Freq: Two times a day (BID) | ORAL | Status: DC
  Administered 2012-01-25 – 2012-01-26 (×4): 0.1 mg via ORAL
  Filled 2012-01-25 (×4): qty 1

## 2012-01-25 NOTE — Progress Notes (Signed)
EKG from 0531 in the morning with QTc  Of 442/455.  Betapace given at 0830.  EKG two hours later showed QTc of 438/465.  Pt tolerated well.  Will continue to monitor. Verified by Christena Deem, RN. Loletha Grayer SN

## 2012-01-25 NOTE — Progress Notes (Signed)
SUBJECTIVE:  Doing well no compliants  OBJECTIVE:   Vitals:   Filed Vitals:   01/24/12 1331 01/24/12 2100 01/24/12 2225 01/25/12 0500  BP: 152/85 176/99 168/78 155/71  Pulse: 78 79  62  Temp: 98 F (36.7 C) 98.2 F (36.8 C)  97.6 F (36.4 C)  TempSrc: Oral Oral  Oral  Resp: 20 18  18   Height: 5' 8.5" (1.74 m)     Weight: 90.4 kg (199 lb 4.7 oz)     SpO2: 94% 97%  96%   I&O's:   Intake/Output Summary (Last 24 hours) at 01/25/12 1313 Last data filed at 01/25/12 0914  Gross per 24 hour  Intake    480 ml  Output      0 ml  Net    480 ml   TELEMETRY: Reviewed telemetry pt in atrial fibrilation     PHYSICAL EXAM General: Well developed, well nourished, in no acute distress Head: Eyes PERRLA, No xanthomas.   Normal cephalic and atramatic  Lungs:   Clear bilaterally to auscultation and percussion. Heart:   Irregularly irregularS1 S2 Pulses are 2+ & equal. Abdomen: Bowel sounds are positive, abdomen soft and non-tender without masses  Extremities:   No clubbing, cyanosis or edema.  DP +1 Neuro: Alert and oriented X 3. Psych:  Good affect, responds appropriately   LABS: Basic Metabolic Panel:  Basename 01/25/12 0538 01/24/12 1402  NA 140 142  K 3.8 4.4  CL 105 107  CO2 23 24  GLUCOSE 98 91  BUN 23 24*  CREATININE 0.81 0.87  CALCIUM 9.4 10.0  MG -- 1.9  PHOS -- 4.0   Liver Function Tests:  Basename 01/24/12 1402  AST 25  ALT 24  ALKPHOS 90  BILITOT 0.4  PROT 7.4  ALBUMIN 4.1    CBC:  Basename 01/24/12 1402  WBC 6.9  NEUTROABS 4.3  HGB 14.6  HCT 41.6  MCV 90.6  PLT 258    Coag Panel:   Lab Results  Component Value Date   INR 1.66* 01/24/2012   INR 1.65* 11/14/2011   INR 1.73* 11/13/2011    RADIOLOGY: X-ray Chest Pa And Lateral   01/24/2012  *RADIOLOGY REPORT*  Clinical Data: Antral fibrillation  CHEST - 2 VIEW  Comparison: 03/15/2011  Findings: Mild cardiomegaly.  Pulmonary vascularity is within normal limits.  No pneumothorax and no pleural  effusion.  No sign of pulmonary edema.  Minimal patchy density in the left midlung zone.  IMPRESSION: Minimal patchy density in the left midlung zone worrisome for focal airspace disease.  Follow-up studies until resolution are recommended.  Original Report Authenticated By: Donavan Burnet, M.D.      ASSESSMENT:  1.  PAF - in and out of afib on Sotolol load with stable QTc 2.  Systemic anticoagulation with Xarelto 3.  Hypertension - poorly controlled 4.  Hyperlipidemia 5.  Breast CA         PLAN:   1.  Continue Betapace load 2.  Home tomorrow if QTc stable 3.  Add Clonidine 0.1mg  BID for BP control  Quintella Reichert, MD  01/25/2012  1:13 PM

## 2012-01-25 NOTE — Progress Notes (Signed)
Pt handled her third dose of sotalol (betapace) well. Her qtc before the medication was 466/416 and her qtc two hours after the medication was 454/449. Vital signs were stable and pt is asymptomatic. Will continue to monitor the pt. Sanda Linger

## 2012-01-25 NOTE — Progress Notes (Signed)
UR Completed. Wise, Jacqueline Christy F [phone]  

## 2012-01-26 ENCOUNTER — Other Ambulatory Visit: Payer: Self-pay

## 2012-01-26 MED ORDER — OFF THE BEAT BOOK
Freq: Once | Status: AC
  Administered 2012-01-26: 09:00:00
  Filled 2012-01-26: qty 1

## 2012-01-26 MED ORDER — CLONIDINE HCL 0.1 MG PO TABS: 0.1000 mg | ORAL_TABLET | Freq: Two times a day (BID) | ORAL | Status: DC

## 2012-01-26 MED ORDER — SOTALOL HCL 80 MG PO TABS: 80.0000 mg | ORAL_TABLET | Freq: Two times a day (BID) | ORAL | Status: DC

## 2012-01-26 NOTE — Plan of Care (Signed)
Problem: Consults Goal: Skin Care Protocol Initiated - if indicated If consults are not indicated, leave blank or document N/A  Outcome: Completed/Met Date Met:  01/26/12 Skin care protocol intiitiated upon arrival patient's skin is within normal condition.  Goal: Nutrition Consult-if indicated Outcome: Not Applicable Date Met:  01/26/12 No nutrition consult was needed Goal: Diabetes Guidelines if Diabetic/Glucose > 140 If diabetic or lab glucose is > 140 mg/dl - Initiate Diabetes/Hyperglycemia Guidelines & Document Interventions  Outcome: Not Applicable Date Met:  01/26/12 Not needed  Problem: Phase II Progression Outcomes Goal: Progress activity as tolerated unless otherwise ordered Outcome: Completed/Met Date Met:  01/26/12 Pt ambulating well.  Goal: IV changed to normal saline lock Outcome: Not Applicable Date Met:  01/26/12 IV saline locked  Problem: Discharge Progression Outcomes Goal: Complications resolved/controlled Outcome: Completed/Met Date Met:  01/26/12 Complications(atrial fibrillation) controlled

## 2012-01-26 NOTE — Progress Notes (Signed)
Pt handled her 5th dose of betapace well. Qt/qtc 484/446. VS stable and charted. Will continue to monitor the pt. Sanda Linger

## 2012-01-26 NOTE — Progress Notes (Signed)
Pt education and discharge instructions were given. Pt stated she understood and repeated back her education. Pt is stable and was informed on when to come back, follow-up appointments as well as where to pick up her prescriptions. [name]

## 2012-01-26 NOTE — Discharge Summary (Signed)
Patient ID: AMORY ZBIKOWSKI MRN: [id number] DOB/AGE: 1936/02/01 76 y.o.  Admit date: 01/24/2012 Discharge date: 01/26/2012  Primary Discharge Diagnosis: AFIB  Secondary Discharge Diagnosis: Sotalol load, chronic anticoagulation, Hypertension, Hypercholesterolemia, Menopause, Arthritis fingers, Breast cancer multifocal, Atrial fibrillation, retroperitoneal bleed from Lovenox.  Significant Diagnostic Studies: Multiple EKGs reviewed, QTC is reviewed. Last QTC 453 ms.  Hospital Course: 76 year old with atrial fibrillation admitted for sotalol load. Intervals were monitored very closely. Last QTC 453 ms. She has symptomatic atrial fibrillation. She was also switched from Coumadin to Xarelto.  Her heart rate remained in the 60s mostly on telemetry. She is on atenolol 25 mg, diltiazem 360 mg as well as new dose sotalol 80 mg twice a day.  On day of discharge, she is eager to go home, ambulating well, no shortness of breath, no chest pain, no syncope, no dizziness, no bleeding.   TSH normal, Hg 14.6, creat 0.81  Discharge Exam: Blood pressure 133/58, pulse 60, temperature 97.4 F (36.3 C), temperature source Oral, resp. rate 18, height 5' 8.5" (1.74 m), weight 90.4 kg (199 lb 4.7 oz), SpO2 95.00%.    Gen., alert and oriented x3 in no acute distress, ambulating well Cardiovascular-irregularly irregular rhythm, normal rate, no murmurs Lungs-clear to auscultation bilaterally. Abdomen-soft Extremities-no edema  Labs:   Lab Results  Component Value Date   WBC 6.9 01/24/2012   HGB 14.6 01/24/2012   HCT 41.6 01/24/2012   MCV 90.6 01/24/2012   PLT 258 01/24/2012     Lab 01/25/12 0538 01/24/12 1402  NA 140 --  K 3.8 --  CL 105 --  CO2 23 --  BUN 23 --  CREATININE 0.81 --  CALCIUM 9.4 --  PROT -- 7.4  BILITOT -- 0.4  ALKPHOS -- 90  ALT -- 24  AST -- 25  GLUCOSE 98 --   Lab Results  Component Value Date   CKTOTAL 199* 11/13/2011   CKMB 6.6* 11/13/2011   TROPONINI <0.30 11/13/2011       Radiology: EKG: Last EKG from 4/10-atrial fibrillation rate 57, QTC 453 ms.  FOLLOW UP PLANS AND APPOINTMENTS Discharge Orders    Future Appointments: Provider: Department: Dept Phone: Center:   02/28/2012 9:15 AM [facility] [phone]     Future Orders Please Complete By Expires   Diet - low sodium heart healthy      Increase activity slowly        Medication List  As of 01/26/2012  9:56 AM   TAKE these medications         acetaminophen 500 MG tablet   Commonly known as: TYLENOL   Take 500 mg by mouth daily.      atenolol 25 MG tablet   Commonly known as: TENORMIN   Take 25 mg by mouth daily.      atorvastatin 40 MG tablet   Commonly known as: LIPITOR   Take 40 mg by mouth daily.      CALCIUM CITRATE + PO   Take 1 tablet by mouth daily.      cloNIDine 0.1 MG tablet   Commonly known as: CATAPRES   Take 1 tablet (0.1 mg total) by mouth 2 (two) times daily.      diltiazem 360 MG 24 hr capsule   Commonly known as: CARDIZEM CD   Take 360 mg by mouth daily.      diphenhydramine-acetaminophen 25-500 MG Tabs   Commonly known as: TYLENOL PM   Take 2 tablets by mouth at bedtime as  needed. For sleep      Glucosamine 500 MG Tabs   Take 1 tablet by mouth 2 (two) times daily.      MELATONIN PO   Take 1 capsule by mouth every evening.      nitroGLYCERIN 0.4 MG SL tablet   Commonly known as: NITROSTAT   Place 0.4 mg under the tongue every 5 (five) minutes as needed. For chest pain      omeprazole 20 MG capsule   Commonly known as: PRILOSEC   Take 20 mg by mouth daily.      sotalol 80 MG tablet   Commonly known as: BETAPACE   Take 1 tablet (80 mg total) by mouth every 12 (twelve) hours.      traMADol 50 MG tablet   Commonly known as: ULTRAM   Take 50 mg by mouth every 6 (six) hours as needed. For pain      XARELTO 20 MG Tabs   Generic drug: Rivaroxaban   Take 20 mg by mouth every evening.      zolpidem 10 MG tablet   Commonly known as: AMBIEN    Take 10 mg by mouth at bedtime as needed. For sleep           She will receive her last dose of sotalol at 8 PM tonight and then subsequently may be discharged home. I have discussed this with her. I discussed this with her nurse, Shanda Bumps. Follow-up Information    Follow up with FERGUSON,CYNTHIA A, NP on 02/02/2012. (910am)    Contact information:   [facility] [address] [place] [address] [phone]          BRING ALL MEDICATIONS WITH YOU TO FOLLOW UP APPOINTMENTS  Time spent with patient to include physician time: Signed: Akiba Melfi 01/26/2012, 9:56 AM

## 2012-01-26 NOTE — Plan of Care (Signed)
Problem: Consults Goal: Atrial Arhythmia Patient Education (See Patient Education module for education specifics.) Outcome: Completed/Met Date Met:  01/26/12 Pt received a booklet Goal: Skin Care Protocol Initiated - if indicated If consults are not indicated, leave blank or document N/A Outcome: Completed/Met Date Met:  01/26/12 Skin within normal limits. Goal: Nutrition Consult-if indicated Outcome: Not Applicable Date Met:  01/26/12 Not needed  Problem: Phase I Progression Outcomes Goal: Anticoagulation Therapy per MD order Outcome: Completed/Met Date Met:  01/26/12 Pt on xarelto Goal: Heart rate or rhythm control medication Outcome: Completed/Met Date Met:  01/26/12 Patient started on Sotalol (betapace) Goal: Pain controlled with appropriate interventions Outcome: Completed/Met Date Met:  01/26/12 Pt not in any pain Goal: Hemodynamically stable Outcome: Completed/Met Date Met:  01/26/12 Vital Signs Stable  Problem: Phase II Progression Outcomes Goal: Anticoagulation Therapy per MD order Outcome: Completed/Met Date Met:  01/26/12 Pt on xarelto Goal: Pain controlled Outcome: Completed/Met Date Met:  01/26/12  Pt on tylenol for arthritis   Problem: Phase III Progression Outcomes Goal: Sinus rhythm established or heart rate < 100 at rest Outcome: Progressing Pt still in atrial fibrillation Goal: Activity at appropriate level-compared to baseline (UP IN CHAIR FOR HEMODIALYSIS) Outcome: Not Applicable Date Met:  01/26/12 Pt able to ambulate independently and at baseline  Problem: Discharge Progression Outcomes Goal: Sinus rate/atrial ECG rhythm with HR < 100/min Outcome: Not Met (add Reason) Pt is atrial fibrillation. HR is under 100

## 2012-01-26 NOTE — Discharge Instructions (Signed)
Atrial Fibrillation Atrial fibrillation is an abnormal heartbeat (rhythm). It can cause your heart rate to be faster or slower than normal, and can cause clots of blood to form in your heart. These clots can cause other health problems. Atrial fibrillation may be caused by a heart attack, lung problem, or certain medicine. Sometimes the cause of atrial fibrillation is not found. HOME CARE  Take blood thinning medicine (anticoagulants) as told by your doctor. Your doctor will need to draw your blood to check lab values if you take blood thinners.   If you had a cardioversion, limit your activity as told by your doctor.   Learn how to check your heartbeat (pulse) for an abnormal or irregular beat. Your doctor can show you how.   Ask your doctor if it is okay to exercise.   Only take medicine as told by your doctor.  GET HELP RIGHT AWAY IF:   You have trouble breathing or feel dizzy.   You have puffy (swollen) feet or ankles.   You have blood in your pee (urine) or poop (bowel movement).   You feel your heart "skipping" beats.   You feel your heart "racing" or beating fast.   You have weakness in your arms or legs.   You have trouble talking, seeing, or thinking.   You have chest pain or pain in your arm or jaw.  MAKE SURE YOU:   Understand these instructions.   Will watch your condition.   Will get help right away if you are not doing well or get worse.  Document Released: 07/13/2008 Document Revised: 09/23/2011 Document Reviewed: 01/22/2010 [facility] Patient Information 2012 [place], [place].     Rivaroxaban oral tablets What is this medicine? RIVAROXABAN (ri va ROX a ban) is an anticoagulant (blood thinner). It is used after knee or hip surgeries to prevent blood clotting. It is also used to lower the chance of stroke in people with a medical condition called atrial fibrillation. This medicine may be used for other purposes; ask your health care provider or pharmacist if  you have questions. What should I tell my health care provider before I take this medicine? They need to know if you have any of these conditions: -bleeding disorders -bleeding in the brain -blood in your stools (black or tarry stools) or if you have blood in your vomit -history of stomach bleeding -kidney disease -liver disease -low blood counts, like low white cell, platelet, or red cell counts -recent or planned spinal or epidural procedure -take medicines that treat or prevent blood clots -an unusual or allergic reaction to rivaroxaban, other medicines, foods, dyes, or preservatives -pregnant or trying to get pregnant -breast-feeding How should I use this medicine? Take this medicine by mouth with a glass of water. Follow the directions on the prescription label. Take your medicine at regular intervals. Do not take it more often than directed. Do not stop taking except on your doctor's advice. Talk to your pediatrician regarding the use of this medicine in children. Special care may be needed. Overdosage: If you think you've taken too much of this medicine contact a poison control center or emergency room at once. Overdosage: If you think you have taken too much of this medicine contact a poison control center or emergency room at once. NOTE: This medicine is only for you. Do not share this medicine with others. What if I miss a dose? If you miss a dose, take it as soon as you can. If it is almost  time for your next dose, take only that dose. Do not take double or extra doses. What may interact with this medicine? -aspirin and aspirin-like medicines -certain antibiotics like erythromycin, azithromycin, and clarithromycin -certain medicines for fungal infections like ketoconazole and itraconazole -certain medicines for irregular heart beat like amiodarone, quinidine, dronedarone -certain medicines for seizures like carbamazepine, phenytoin -certain medicines that treat or prevent  blood clots like warfarin, enoxaparin, and dalteparin  -conivaptan -diltiazem -felodipine -indinavir -lopinavir; ritonavir -NSAIDS, medicines for pain and inflammation, like ibuprofen or naproxen -ranolazine -rifampin -ritonavir -St. John's wort -verapamil This list may not describe all possible interactions. Give your health care provider a list of all the medicines, herbs, non-prescription drugs, or dietary supplements you use. Also tell them if you smoke, drink alcohol, or use illegal drugs. Some items may interact with your medicine. What should I watch for while using this medicine? This medicine may increase your risk to bruise or bleed. Call your doctor or health care professional if you notice any unusual bleeding. Be careful brushing and flossing your teeth or using a toothpick because you may bleed more easily. If you have any dental work done, tell your dentist you are receiving this medicine. What side effects may I notice from receiving this medicine? Side effects that you should report to your doctor or health care professional as soon as possible: -allergic reactions like skin rash, itching or hives, swelling of the face, lips, or tongue -bloody or black, tarry stools -changes in vision -confusion, trouble speaking or understanding -red or dark-brown urine -redness, blistering, peeling or loosening of the skin, including inside the mouth -severe headaches -spitting up blood or brown material that looks like coffee grounds -sudden numbness or weakness of the face, arm or leg -trouble walking, dizziness, loss of balance or coordination -unusual bruising or bleeding from the eye, gums, or nose  Side effects that usually do not require medical attention (Report these to your doctor or health care professional if they continue or are bothersome.): -dizziness -muscle pain This list may not describe all possible side effects. Call your doctor for medical advice about side  effects. You may report side effects to FDA at 1-800-FDA-1088. Where should I keep my medicine? Keep out of the reach of children. Store at room temperature between 15 and 30 degrees C (59 and 86 degrees F). Throw away any unused medicine after the expiration date. NOTE: This sheet is a summary. It may not cover all possible information. If you have questions about this medicine, talk to your doctor, pharmacist, or health care provider.  2012, [employer]. (08/25/2010 8:48:10 AM)

## 2012-02-02 ENCOUNTER — Other Ambulatory Visit (HOSPITAL_COMMUNITY): Payer: Self-pay | Admitting: Cardiology

## 2012-02-02 DIAGNOSIS — I4891 Unspecified atrial fibrillation: Secondary | ICD-10-CM

## 2012-02-11 ENCOUNTER — Ambulatory Visit (HOSPITAL_COMMUNITY)
Admission: RE | Admit: 2012-02-11 | Discharge: 2012-02-11 | Disposition: A | Discharge status: Home / Self Care | Payer: BC Managed Care – PPO | Source: Ambulatory Visit | Attending: Cardiology | Admitting: Cardiology

## 2012-02-11 DIAGNOSIS — I4891 Unspecified atrial fibrillation: Secondary | ICD-10-CM | POA: Insufficient documentation

## 2012-02-11 DIAGNOSIS — Z79899 Other long term (current) drug therapy: Secondary | ICD-10-CM | POA: Insufficient documentation

## 2012-02-28 ENCOUNTER — Ambulatory Visit (HOSPITAL_COMMUNITY)
Admission: RE | Admit: 2012-02-28 | Discharge: 2012-02-28 | Disposition: A | Discharge status: Home / Self Care | Payer: BC Managed Care – PPO | Source: Ambulatory Visit | Attending: Family Medicine | Admitting: Family Medicine

## 2012-02-28 DIAGNOSIS — Z1231 Encounter for screening mammogram for malignant neoplasm of breast: Secondary | ICD-10-CM | POA: Insufficient documentation

## 2012-03-01 ENCOUNTER — Encounter: Payer: Self-pay | Admitting: Internal Medicine

## 2012-03-01 ENCOUNTER — Ambulatory Visit (INDEPENDENT_AMBULATORY_CARE_PROVIDER_SITE_OTHER): Payer: BC Managed Care – PPO | Admitting: Internal Medicine

## 2012-03-01 VITALS — BP 126/72 | HR 59 | Ht 67.72 in | Wt 200.0 lb

## 2012-03-01 DIAGNOSIS — I4819 Other persistent atrial fibrillation: Secondary | ICD-10-CM

## 2012-03-01 DIAGNOSIS — I4891 Unspecified atrial fibrillation: Secondary | ICD-10-CM

## 2012-03-01 DIAGNOSIS — I10 Essential (primary) hypertension: Secondary | ICD-10-CM

## 2012-03-01 NOTE — Progress Notes (Signed)
ELECTROPHYSIOLOGY CONSULT NOTE  Patient ID: [name] MRN: [id number], DOB/AGE: 1936-03-22   Admit date: (Not on file) Date of Consult: 03/01/2012  Primary Physician: Elie Confer, MD, MD Primary Cardiologist: Armanda Magic, MD Reason for Consultation: Atrial fibrillation  History of Present Illness [name] is a pleasant 75 year old woman with history of atrial fibrillation, diagnosed May 2012 who has been referred for EP consultation and recommedations regarding persistent AF. [name] reports chest pain, shortness of breath and racing "fluttering" palpitations with rapid AF. She states these symptoms historically have improved with rate control. She was started on sotalol in hopes of maintaining SR; however, this was discontinued due to SOB. She has remained on rate control with atenolol and diltiazem as well as Xarelto for embolic prophylaxis. She is able to walk daily without difficulty and has been able to perform her daily  activities without limitation. She denies valvular heart disease, thyroid dysfunction, CAD or CHF. She had a nuclear stress test in March 2013 which was negative for ischemia.  Past Medical History  Diagnosis Date  . Carpal tunnel syndrome   . Hypertension   . Hypercholesteremia   . A-fib   . Infected postoperative breast seroma 2006    s/p drainage  . Breast implant removal status 2006  . S/P carpal tunnel release 2005  . Retroperitoneal hematoma 03/2011  . Arthritis   . Cancer     lt breast    Past Surgical History  Procedure Date  . Mastectomy 2004    LEFT  . Carpal tunnel release     right hand  . Appendectomy 1965  . Tubal ligation 1973  . Breast surgery   . Mastectomy, modified radical w/reconstruction 2004    left breast  . Colonoscopy 2006  . Esophagogastroduodenoscopy 2006    Allergies/Intolerances Allergies  Allergen Reactions  . Crestor (Rosuvastatin Calcium) Other (See Comments)    cough  . Band-Aid  Plus Antibiotic (Bacitracin-Polymyxin B) Rash   Home Medications . acetaminophen (TYLENOL) 500 MG tablet Take 500 mg by mouth daily.       Marland Kitchen atenolol (TENORMIN) 25 MG tablet Take 25 mg by mouth daily.      Marland Kitchen atorvastatin (LIPITOR) 40 MG tablet Take 40 mg by mouth daily.        . calcium citrate-Vitamin D  Take 1 tablet by mouth daily.      Marland Kitchen diltiazem (CARDIZEM CD) 360 MG 24 hr capsule Take 360 mg by mouth daily.      . diphenhydramine-acetaminophen (TYLENOL PM) 25-500 MG TABS Take 2 tablets by mouth at bedtime as needed. For sleep      . glucosamine 500 MG TABS Take 1 tablet by mouth 2 (two) times daily.       . nitroglycerin 0.4 MG SL tablet Place 0.4 mg under the tongue every 5 (five) minutes as needed. For chest pain      . Nutritional Supplements (MELATONIN PO) Take 1 capsule by mouth every evening.      Marland Kitchen omeprazole (PRILOSEC) 20 MG capsule Take 20 mg by mouth daily.      . rivaroxaban (XARELTO) 20 MG TABS Take 20 mg by mouth every evening.      . traMADol (ULTRAM) 50 MG tablet Take 50 mg by mouth every 6 (six) hours as needed. For pain      . zolpidem (AMBIEN) 10 MG tablet Take 10 mg by mouth at bedtime as needed. For sleep  Family History Family History  Problem Relation Age of Onset  . Heart disease Mother   . Heart disease Father     Social History Social History  . Marital Status: Married    Spouse Name: N/A    Number of Children: N/A  . Years of Education: N/A   Social History Main Topics  . Smoking status: Former Smoker -- 6 years  . Smokeless tobacco: Never Used   Comment: quit smoking 1996  . Alcohol Use: Yes     SOMETIMES  . Drug Use: No  . Sexually Active: Not on file   Review of Systems General:  No chills, fever, night sweats or weight changes.  Cardiovascular:  No chest pain, dyspnea on exertion, edema, orthopnea, palpitations, paroxysmal nocturnal dyspnea. Dermatological: No rash, lesions/masses Respiratory: No cough, dyspnea Urologic: No  hematuria, dysuria Abdominal:   No nausea, vomiting, diarrhea, bright red blood per rectum, melena, or hematemesis Neurologic:  No visual changes, wkns, changes in mental status. All other systems reviewed and are otherwise negative except as noted above.  Physical Exam Blood pressure 126/72, pulse 59, height 5' 7.72" (1.72 m), weight 200 lb (90.719 kg).  General: Well developed, well appearing 76 year old female, in no acute distress. She is accompanied by her husband. HEENT: Normocephalic, atraumatic. EOMs intact. Sclera nonicteric. Oropharynx clear.  Neck: Supple without bruits. No JVD. Lungs:  Respirations regular and unlabored, CTA bilaterally. No wheezes, rales or rhonchi. Heart: RRR. S1, S2 present. No murmurs, rub, S3 or S4. Abdomen: Soft, non-tender, non-distended. BS present x 4 quadrants. No hepatosplenomegaly.  Extremities: No clubbing, cyanosis or edema. DP/PT/Radials 2+ and equal bilaterally. Psych: Normal affect. Neuro: Alert and oriented X 3. Moves all extremities spontaneously.    12-lead ECG today shows rate controlled atrial fibrillation at 59 bpm; QRS 88 ms, QT/QTc 404/399 ms  Assessment and Recommendations

## 2012-03-01 NOTE — Patient Instructions (Signed)
Your physician recommends that you schedule a follow-up appointment as scheduled  

## 2012-03-02 NOTE — Assessment & Plan Note (Signed)
Stable No change required today  

## 2012-03-02 NOTE — Assessment & Plan Note (Signed)
The patient has persistent AF and is presently minimally symptomatic with rate control. Therapeutic strategies for afib including rate control and rhythm control were discussed in detail with the patient today.  At this time, she is very clear that she would not want to consider aggressive therapies for rhythm control.  She would prefer rate control long term. As she has minimal symptoms, I think that this is a very reasonable approach.  She is well rate controlled and appropriately anticoagulated at this time. No changes are made today.

## 2012-03-31 ENCOUNTER — Encounter: Payer: Self-pay | Admitting: Internal Medicine

## 2012-04-04 ENCOUNTER — Encounter: Payer: Self-pay | Admitting: Internal Medicine

## 2012-07-18 DIAGNOSIS — Z23 Encounter for immunization: Secondary | ICD-10-CM | POA: Diagnosis not present

## 2012-07-18 DIAGNOSIS — R0601 Orthopnea: Secondary | ICD-10-CM | POA: Diagnosis not present

## 2012-07-18 DIAGNOSIS — R0609 Other forms of dyspnea: Secondary | ICD-10-CM | POA: Diagnosis not present

## 2012-07-18 DIAGNOSIS — R0989 Other specified symptoms and signs involving the circulatory and respiratory systems: Secondary | ICD-10-CM | POA: Diagnosis not present

## 2012-07-19 ENCOUNTER — Other Ambulatory Visit: Payer: Self-pay | Admitting: Family Medicine

## 2012-07-19 ENCOUNTER — Ambulatory Visit
Admission: RE | Admit: 2012-07-19 | Discharge: 2012-07-19 | Disposition: A | Discharge status: Home / Self Care | Payer: Self-pay | Source: Ambulatory Visit | Attending: Family Medicine | Admitting: Family Medicine

## 2012-07-19 DIAGNOSIS — Z79899 Other long term (current) drug therapy: Secondary | ICD-10-CM | POA: Diagnosis not present

## 2012-07-19 DIAGNOSIS — R0601 Orthopnea: Secondary | ICD-10-CM

## 2012-07-19 DIAGNOSIS — R0609 Other forms of dyspnea: Secondary | ICD-10-CM | POA: Diagnosis not present

## 2012-07-19 DIAGNOSIS — R0989 Other specified symptoms and signs involving the circulatory and respiratory systems: Secondary | ICD-10-CM | POA: Diagnosis not present

## 2012-07-19 DIAGNOSIS — Z1211 Encounter for screening for malignant neoplasm of colon: Secondary | ICD-10-CM | POA: Diagnosis not present

## 2012-07-19 DIAGNOSIS — E7800 Pure hypercholesterolemia, unspecified: Secondary | ICD-10-CM | POA: Diagnosis not present

## 2012-07-19 DIAGNOSIS — R0602 Shortness of breath: Secondary | ICD-10-CM

## 2012-07-19 DIAGNOSIS — C50919 Malignant neoplasm of unspecified site of unspecified female breast: Secondary | ICD-10-CM | POA: Diagnosis not present

## 2012-07-19 DIAGNOSIS — I4891 Unspecified atrial fibrillation: Secondary | ICD-10-CM | POA: Diagnosis not present

## 2012-07-19 DIAGNOSIS — I10 Essential (primary) hypertension: Secondary | ICD-10-CM | POA: Diagnosis not present

## 2012-07-20 DIAGNOSIS — R0602 Shortness of breath: Secondary | ICD-10-CM | POA: Diagnosis not present

## 2012-07-20 DIAGNOSIS — I4891 Unspecified atrial fibrillation: Secondary | ICD-10-CM | POA: Diagnosis not present

## 2012-07-20 DIAGNOSIS — I10 Essential (primary) hypertension: Secondary | ICD-10-CM | POA: Diagnosis not present

## 2012-07-21 ENCOUNTER — Encounter: Payer: Self-pay | Admitting: *Deleted

## 2012-07-21 ENCOUNTER — Other Ambulatory Visit: Payer: Self-pay | Admitting: Cardiology

## 2012-07-21 DIAGNOSIS — R0602 Shortness of breath: Secondary | ICD-10-CM

## 2012-07-27 DIAGNOSIS — G4733 Obstructive sleep apnea (adult) (pediatric): Secondary | ICD-10-CM | POA: Diagnosis not present

## 2012-07-27 DIAGNOSIS — I4891 Unspecified atrial fibrillation: Secondary | ICD-10-CM | POA: Diagnosis not present

## 2012-07-28 ENCOUNTER — Ambulatory Visit (HOSPITAL_COMMUNITY)
Admission: RE | Admit: 2012-07-28 | Discharge: 2012-07-28 | Disposition: A | Discharge status: Home / Self Care | Payer: Medicare Other | Source: Ambulatory Visit | Attending: Cardiology | Admitting: Cardiology

## 2012-07-28 DIAGNOSIS — I369 Nonrheumatic tricuspid valve disorder, unspecified: Secondary | ICD-10-CM | POA: Insufficient documentation

## 2012-07-28 DIAGNOSIS — R0989 Other specified symptoms and signs involving the circulatory and respiratory systems: Secondary | ICD-10-CM | POA: Insufficient documentation

## 2012-07-28 DIAGNOSIS — I379 Nonrheumatic pulmonary valve disorder, unspecified: Secondary | ICD-10-CM | POA: Diagnosis not present

## 2012-07-28 DIAGNOSIS — I4891 Unspecified atrial fibrillation: Secondary | ICD-10-CM | POA: Diagnosis not present

## 2012-07-28 DIAGNOSIS — R0609 Other forms of dyspnea: Secondary | ICD-10-CM | POA: Insufficient documentation

## 2012-07-28 DIAGNOSIS — I080 Rheumatic disorders of both mitral and aortic valves: Secondary | ICD-10-CM | POA: Diagnosis not present

## 2012-07-28 DIAGNOSIS — R0602 Shortness of breath: Secondary | ICD-10-CM

## 2012-07-28 NOTE — Progress Notes (Signed)
  Echocardiogram 2D Echocardiogram has been performed.  [name] 07/28/2012, 11:11 AM

## 2012-07-31 DIAGNOSIS — R0602 Shortness of breath: Secondary | ICD-10-CM | POA: Diagnosis not present

## 2012-07-31 DIAGNOSIS — Z79899 Other long term (current) drug therapy: Secondary | ICD-10-CM | POA: Diagnosis not present

## 2012-07-31 DIAGNOSIS — I4891 Unspecified atrial fibrillation: Secondary | ICD-10-CM | POA: Diagnosis not present

## 2012-07-31 DIAGNOSIS — I10 Essential (primary) hypertension: Secondary | ICD-10-CM | POA: Diagnosis not present

## 2012-08-01 ENCOUNTER — Ambulatory Visit (HOSPITAL_COMMUNITY): Payer: Medicare Other

## 2012-08-02 ENCOUNTER — Ambulatory Visit (HOSPITAL_COMMUNITY): Admission: RE | Admit: 2012-08-02 | Payer: Medicare Other | Source: Ambulatory Visit

## 2012-08-03 DIAGNOSIS — Z1211 Encounter for screening for malignant neoplasm of colon: Secondary | ICD-10-CM | POA: Diagnosis not present

## 2012-08-04 ENCOUNTER — Encounter (HOSPITAL_COMMUNITY): Payer: Self-pay | Admitting: Pharmacist

## 2012-08-08 DIAGNOSIS — M25519 Pain in unspecified shoulder: Secondary | ICD-10-CM | POA: Diagnosis not present

## 2012-08-08 NOTE — H&P (Signed)
CC: right shoulder pain HPI: 76 y/o female with worsening right shoulder pain and function due to end stage osteoarthritis. Pt has elected for a total shoulder arthroplasty to decrease pain and increase function PMH: hypertension, a-fib, breast CA with mastectomy, hyperlipidemia, rheumatoid, gout, fibromyalgia, GERD,  Social: no etoh, former smoker, married, Dr. Armanda Magic Allergies: crestor, adhesives, clonidine, betapace Meds: glucosamine, xarelto, atenolol, diltiazem, benicar, lipitor, nitro, ultram, prilosec, lasix Family: coronary artery disease ROS: pain with rom right upper extremity, hx of urinary incontinence  PE: 48 132/82 29  76 y/o female in no acute distress alert and oriented x 3 Cervical spine shows full rom cranial nerves 2-12 intact Chest with active breath sounds bilaterally no wheeze rhonchi or rales Heart: irregular rhythm Abd: non tender non distended with active bowel sounds Right upper extremity: FF 90 ER 0 IR 20, nv intact distally Strength 3.5/5 with ER and IR strength as compared to left No rashes or edema X-rays: end stage osteoarthritis right shoulder Assessment: right shoulder end stage osteoarthritis Plan; plan for surgical management to decrease pain and increase function

## 2012-08-09 ENCOUNTER — Encounter (HOSPITAL_COMMUNITY)
Admission: RE | Admit: 2012-08-09 | Discharge: 2012-08-09 | Disposition: A | Discharge status: Home / Self Care | Payer: Medicare Other | Source: Ambulatory Visit | Attending: Orthopedic Surgery | Admitting: Orthopedic Surgery

## 2012-08-09 ENCOUNTER — Encounter (HOSPITAL_COMMUNITY): Payer: Self-pay

## 2012-08-09 DIAGNOSIS — I10 Essential (primary) hypertension: Secondary | ICD-10-CM | POA: Diagnosis not present

## 2012-08-09 DIAGNOSIS — M19019 Primary osteoarthritis, unspecified shoulder: Secondary | ICD-10-CM | POA: Diagnosis not present

## 2012-08-09 DIAGNOSIS — I4891 Unspecified atrial fibrillation: Secondary | ICD-10-CM | POA: Diagnosis not present

## 2012-08-09 DIAGNOSIS — Z853 Personal history of malignant neoplasm of breast: Secondary | ICD-10-CM | POA: Diagnosis not present

## 2012-08-09 HISTORY — DX: Fibromyalgia: M79.7

## 2012-08-09 HISTORY — DX: Adverse effect of unspecified anesthetic, initial encounter: T41.45XA

## 2012-08-09 HISTORY — DX: Gastro-esophageal reflux disease without esophagitis: K21.9

## 2012-08-09 HISTORY — DX: Other complications of anesthesia, initial encounter: T88.59XA

## 2012-08-09 LAB — CBC
HCT: 42.9 % (ref 36.0–46.0)
Hemoglobin: 14.8 g/dL (ref 12.0–15.0)
MCV: 91.3 fL (ref 78.0–100.0)
RBC: 4.7 MIL/uL (ref 3.87–5.11)
WBC: 7.1 10*3/uL (ref 4.0–10.5)

## 2012-08-09 LAB — ABO/RH: ABO/RH(D): B POS

## 2012-08-09 LAB — SURGICAL PCR SCREEN
MRSA, PCR: NEGATIVE
Staphylococcus aureus: POSITIVE — AB

## 2012-08-09 LAB — BASIC METABOLIC PANEL
CO2: 27 mEq/L (ref 19–32)
Chloride: 100 mEq/L (ref 96–112)
GFR calc Af Amer: 61 mL/min — ABNORMAL LOW (ref >90)
Potassium: 4.4 mEq/L (ref 3.5–5.1)

## 2012-08-09 LAB — TYPE AND SCREEN: Antibody Screen: NEGATIVE

## 2012-08-09 MED ORDER — CHLORHEXIDINE GLUCONATE 4 % EX LIQD: 60.0000 mL | Freq: Once | CUTANEOUS | Status: DC

## 2012-08-09 NOTE — Progress Notes (Signed)
Dr Johney Frame note 5/13, ekg 5/13,cxr 10/13 stress,5/12 echo 10/13 sleep study 08/05/12 req'd from [facility]

## 2012-08-09 NOTE — Pre-Procedure Instructions (Addendum)
20 REIGHN KAPLAN  08/09/2012   Your procedure is scheduled on: 08/11/12  Report to [facility] at 830 AM.  Call this number if you have problems the morning of surgery: [phone]   Remember:   Do not eat food or drink:After Midnight.    Take these medicines the morning of surgery with A SIP OF WATER: atenolol, diltiazem, prilosec STOP glucosamine, xarelto per dr   Drucilla Schmidt not wear jewelry, make-up or nail polish.  Do not wear lotions, powders, or perfumes.  Do not shave 48 hours prior to surgery. Men may shave face and neck.  Do not bring valuables to the hospital.  Contacts, dentures or bridgework may not be worn into surgery.  Leave suitcase in the car. After surgery it may be brought to your room.  For patients admitted to the hospital, checkout time is 11:00 AM the day of discharge.   Patients discharged the day of surgery will not be allowed to drive home.  Name and phone number of your driver: dennis spouse [phone]  Special Instructions: Incentive Spirometry - Practice and bring it with you on the day of surgery. Shower using CHG 2 nights before surgery and the night before surgery.  If you shower the day of surgery use CHG.  Use special wash - you have one bottle of CHG for all showers.  You should use approximately 1/3 of the bottle for each shower.   Please read over the following fact sheets that you were given: Pain Booklet, Coughing and Deep Breathing, Blood Transfusion Information, MRSA Information and Surgical Site Infection Prevention

## 2012-08-10 ENCOUNTER — Encounter (HOSPITAL_COMMUNITY): Payer: Self-pay | Admitting: Vascular Surgery

## 2012-08-10 MED ORDER — CEFAZOLIN SODIUM-DEXTROSE 2-3 GM-% IV SOLR
2.0000 g | INTRAVENOUS | Status: AC
  Administered 2012-08-11: 2 g via INTRAVENOUS
  Filled 2012-08-10: qty 50

## 2012-08-10 NOTE — Consult Note (Signed)
Anesthesia chart review: Patient is a 76 year old female scheduled for right shoulder versus reverse shoulder arthroplasty on 08/11/2012 by Dr. Devonne Doughty. History includes former smoker, hypertension, atrial fibrillation, retroperitoneal bleed secondary to Lovenox, mild to moderate AS, moderate TR/PR, mild MR by 07/28/12 echo, hypercholesterolemia, arthritis, GERD, obstructive sleep apnea, fibromyalgia, breast cancer with history of left  Mastectomy '04, claustrophobia.  Cardiologist is Dr. Armanda Magic [facility]).  She saw patient on 07/20/12 for a preoperative evaluation.  She initially ordered a cardiac CTA and follow-up echo due to SOB.  However, the CTA was ultimately canceled after patient's SOB improved after lifestyles changes.  Following her echo (see below), Dr. Mayford Knife felt "patient is stable from a cardiac standpoint to undergo surgery."   EKG on 08/09/12 showed afib  @ 75 bpm, LVH.  Echo on 07/28/12 showed: - Left ventricle: The cavity size was normal. There was mild concentric hypertrophy. Systolic function was normal. The estimated ejection fraction was in the range of 55% to 60%. Wall motion was normal; there were no regional wall motion abnormalities. Doppler parameters are consistent with abnormal left ventricular relaxation (grade 1 diastolic dysfunction). - Aortic valve: There was mild to moderate stenosis. Moderate regurgitation. Valve area: 1.28cm^2(VTI). Valve area: 1.29cm^2 (Vmax). - Mitral valve: Calcified annulus. Mildly thickened leaflets . Mild regurgitation. - Left atrium: The atrium was moderately dilated. - Right atrium: The atrium was mildly dilated. - Tricuspid valve: Moderate regurgitation.  Nuclear stress test on 03/18/11 showed: 1. No evidence of inducible myocardial ischemia, or myocardial scar.  2. Normal left ventricular wall motion study. Calculated left  ventricular ejection fraction of 59%.   CXR on 07/19/12 showed stable cardiomegaly without acute  findings.  Labs noted.  If no significant change in her status then plan to proceed.  Shonna Chock, PA-C

## 2012-08-11 ENCOUNTER — Encounter (HOSPITAL_COMMUNITY)
Admission: RE | Disposition: A | Discharge status: Home / Self Care | Payer: Self-pay | Source: Ambulatory Visit | Attending: Orthopedic Surgery

## 2012-08-11 ENCOUNTER — Inpatient Hospital Stay (HOSPITAL_COMMUNITY)
Admission: RE | Admit: 2012-08-11 | Discharge: 2012-08-13 | DRG: 484 | Disposition: A | Discharge status: Home / Self Care | Payer: Medicare Other | Source: Ambulatory Visit | Attending: Orthopedic Surgery | Admitting: Orthopedic Surgery

## 2012-08-11 ENCOUNTER — Inpatient Hospital Stay (HOSPITAL_COMMUNITY): Payer: Medicare Other | Admitting: Vascular Surgery

## 2012-08-11 ENCOUNTER — Encounter (HOSPITAL_COMMUNITY): Payer: Self-pay | Admitting: Vascular Surgery

## 2012-08-11 ENCOUNTER — Encounter (HOSPITAL_COMMUNITY): Payer: Self-pay | Admitting: *Deleted

## 2012-08-11 DIAGNOSIS — I4891 Unspecified atrial fibrillation: Secondary | ICD-10-CM | POA: Diagnosis present

## 2012-08-11 DIAGNOSIS — E785 Hyperlipidemia, unspecified: Secondary | ICD-10-CM | POA: Diagnosis present

## 2012-08-11 DIAGNOSIS — Z01810 Encounter for preprocedural cardiovascular examination: Secondary | ICD-10-CM

## 2012-08-11 DIAGNOSIS — M25519 Pain in unspecified shoulder: Secondary | ICD-10-CM | POA: Diagnosis not present

## 2012-08-11 DIAGNOSIS — M109 Gout, unspecified: Secondary | ICD-10-CM | POA: Diagnosis present

## 2012-08-11 DIAGNOSIS — Z96619 Presence of unspecified artificial shoulder joint: Secondary | ICD-10-CM

## 2012-08-11 DIAGNOSIS — G5600 Carpal tunnel syndrome, unspecified upper limb: Secondary | ICD-10-CM | POA: Diagnosis not present

## 2012-08-11 DIAGNOSIS — Z01812 Encounter for preprocedural laboratory examination: Secondary | ICD-10-CM | POA: Diagnosis not present

## 2012-08-11 DIAGNOSIS — IMO0001 Reserved for inherently not codable concepts without codable children: Secondary | ICD-10-CM | POA: Diagnosis present

## 2012-08-11 DIAGNOSIS — Z853 Personal history of malignant neoplasm of breast: Secondary | ICD-10-CM

## 2012-08-11 DIAGNOSIS — G4730 Sleep apnea, unspecified: Secondary | ICD-10-CM | POA: Diagnosis present

## 2012-08-11 DIAGNOSIS — Z01811 Encounter for preprocedural respiratory examination: Secondary | ICD-10-CM | POA: Diagnosis not present

## 2012-08-11 DIAGNOSIS — M069 Rheumatoid arthritis, unspecified: Secondary | ICD-10-CM | POA: Diagnosis present

## 2012-08-11 DIAGNOSIS — K219 Gastro-esophageal reflux disease without esophagitis: Secondary | ICD-10-CM | POA: Diagnosis not present

## 2012-08-11 DIAGNOSIS — G8918 Other acute postprocedural pain: Secondary | ICD-10-CM | POA: Diagnosis not present

## 2012-08-11 DIAGNOSIS — Z7901 Long term (current) use of anticoagulants: Secondary | ICD-10-CM | POA: Diagnosis not present

## 2012-08-11 DIAGNOSIS — M19019 Primary osteoarthritis, unspecified shoulder: Secondary | ICD-10-CM | POA: Diagnosis not present

## 2012-08-11 DIAGNOSIS — I10 Essential (primary) hypertension: Secondary | ICD-10-CM | POA: Diagnosis present

## 2012-08-11 DIAGNOSIS — Z79899 Other long term (current) drug therapy: Secondary | ICD-10-CM | POA: Diagnosis not present

## 2012-08-11 HISTORY — PX: TOTAL SHOULDER ARTHROPLASTY: SHX126

## 2012-08-11 SURGERY — ARTHROPLASTY, SHOULDER, TOTAL
Anesthesia: General | Site: Shoulder | Laterality: Right | Wound class: Clean

## 2012-08-11 MED ORDER — RIVAROXABAN 20 MG PO TABS
20.0000 mg | ORAL_TABLET | Freq: Every day | ORAL | Status: DC
  Filled 2012-08-11: qty 1

## 2012-08-11 MED ORDER — SODIUM CHLORIDE 0.9 % IR SOLN
Status: DC | PRN
  Administered 2012-08-11: 1000 mL

## 2012-08-11 MED ORDER — THROMBIN 5000 UNITS EX SOLR
CUTANEOUS | Status: AC
  Filled 2012-08-11: qty 5000

## 2012-08-11 MED ORDER — METOCLOPRAMIDE HCL 5 MG/ML IJ SOLN: 5.0000 mg | Freq: Three times a day (TID) | INTRAMUSCULAR | Status: DC | PRN

## 2012-08-11 MED ORDER — FUROSEMIDE 20 MG PO TABS
20.0000 mg | ORAL_TABLET | Freq: Every day | ORAL | Status: DC
  Administered 2012-08-12 – 2012-08-13 (×2): 20 mg via ORAL
  Filled 2012-08-11 (×3): qty 1

## 2012-08-11 MED ORDER — HYDROMORPHONE HCL PF 1 MG/ML IJ SOLN: 0.2500 mg | INTRAMUSCULAR | Status: DC | PRN

## 2012-08-11 MED ORDER — CEFAZOLIN SODIUM-DEXTROSE 2-3 GM-% IV SOLR
2.0000 g | Freq: Four times a day (QID) | INTRAVENOUS | Status: AC
  Administered 2012-08-11 – 2012-08-12 (×3): 2 g via INTRAVENOUS
  Filled 2012-08-11 (×3): qty 50

## 2012-08-11 MED ORDER — OXYCODONE-ACETAMINOPHEN 5-325 MG PO TABS
1.0000 | ORAL_TABLET | ORAL | Status: DC | PRN
  Administered 2012-08-11 – 2012-08-13 (×8): 2 via ORAL
  Filled 2012-08-11 (×8): qty 2

## 2012-08-11 MED ORDER — MENTHOL 3 MG MT LOZG: 1.0000 | LOZENGE | OROMUCOSAL | Status: DC | PRN

## 2012-08-11 MED ORDER — SODIUM CHLORIDE 0.9 % IV SOLN
INTRAVENOUS | Status: DC
Start: 2012-08-11 — End: 2012-08-13
  Administered 2012-08-11 – 2012-08-12 (×2): via INTRAVENOUS

## 2012-08-11 MED ORDER — ACETAMINOPHEN 650 MG RE SUPP: 650.0000 mg | Freq: Four times a day (QID) | RECTAL | Status: DC | PRN

## 2012-08-11 MED ORDER — METHOCARBAMOL 500 MG PO TABS: 500.0000 mg | ORAL_TABLET | Freq: Three times a day (TID) | ORAL | Status: DC | PRN

## 2012-08-11 MED ORDER — LIDOCAINE HCL (CARDIAC) 20 MG/ML IV SOLN
INTRAVENOUS | Status: DC | PRN
  Administered 2012-08-11: 40 mg via INTRAVENOUS

## 2012-08-11 MED ORDER — THROMBIN 5000 UNITS EX SOLR
CUTANEOUS | Status: DC | PRN
  Administered 2012-08-11: 5000 [IU] via TOPICAL

## 2012-08-11 MED ORDER — BUPIVACAINE-EPINEPHRINE PF 0.25-1:200000 % IJ SOLN
INTRAMUSCULAR | Status: AC
  Filled 2012-08-11: qty 30

## 2012-08-11 MED ORDER — ACETAMINOPHEN 10 MG/ML IV SOLN: 1000.0000 mg | Freq: Once | INTRAVENOUS | Status: DC | PRN

## 2012-08-11 MED ORDER — ONDANSETRON HCL 4 MG/2ML IJ SOLN: 4.0000 mg | Freq: Four times a day (QID) | INTRAMUSCULAR | Status: DC | PRN

## 2012-08-11 MED ORDER — GLUCOSAMINE 500 MG PO TABS: 500.0000 mg | ORAL_TABLET | Freq: Every day | ORAL | Status: DC

## 2012-08-11 MED ORDER — METHOCARBAMOL 100 MG/ML IJ SOLN
500.0000 mg | Freq: Four times a day (QID) | INTRAVENOUS | Status: DC | PRN
  Filled 2012-08-11: qty 5

## 2012-08-11 MED ORDER — OXYCODONE-ACETAMINOPHEN 5-325 MG PO TABS: 1.0000 | ORAL_TABLET | ORAL | Status: DC | PRN

## 2012-08-11 MED ORDER — ONDANSETRON HCL 4 MG PO TABS: 4.0000 mg | ORAL_TABLET | Freq: Four times a day (QID) | ORAL | Status: DC | PRN

## 2012-08-11 MED ORDER — PROPOFOL 10 MG/ML IV BOLUS
INTRAVENOUS | Status: DC | PRN
  Administered 2012-08-11: 100 mg via INTRAVENOUS

## 2012-08-11 MED ORDER — PHENOL 1.4 % MT LIQD: 1.0000 | OROMUCOSAL | Status: DC | PRN

## 2012-08-11 MED ORDER — ATENOLOL 25 MG PO TABS
25.0000 mg | ORAL_TABLET | Freq: Every day | ORAL | Status: DC
  Administered 2012-08-12 – 2012-08-13 (×2): 25 mg via ORAL
  Filled 2012-08-11 (×2): qty 1

## 2012-08-11 MED ORDER — BUPIVACAINE-EPINEPHRINE 0.25% -1:200000 IJ SOLN
INTRAMUSCULAR | Status: DC | PRN
  Administered 2012-08-11: 20 mL

## 2012-08-11 MED ORDER — FENTANYL CITRATE 0.05 MG/ML IJ SOLN
INTRAMUSCULAR | Status: DC | PRN
  Administered 2012-08-11 (×2): 50 ug via INTRAVENOUS

## 2012-08-11 MED ORDER — ONDANSETRON HCL 4 MG/2ML IJ SOLN: 4.0000 mg | Freq: Once | INTRAMUSCULAR | Status: DC | PRN

## 2012-08-11 MED ORDER — ACETAMINOPHEN 325 MG PO TABS: 650.0000 mg | ORAL_TABLET | Freq: Four times a day (QID) | ORAL | Status: DC | PRN

## 2012-08-11 MED ORDER — LACTATED RINGERS IV SOLN
INTRAVENOUS | Status: DC | PRN
  Administered 2012-08-11 (×2): via INTRAVENOUS

## 2012-08-11 MED ORDER — MORPHINE SULFATE 2 MG/ML IJ SOLN: 1.0000 mg | INTRAMUSCULAR | Status: DC | PRN

## 2012-08-11 MED ORDER — TRAMADOL HCL 50 MG PO TABS
50.0000 mg | ORAL_TABLET | Freq: Two times a day (BID) | ORAL | Status: DC
  Administered 2012-08-11 – 2012-08-13 (×4): 50 mg via ORAL
  Filled 2012-08-11: qty 1
  Filled 2012-08-11: qty 2
  Filled 2012-08-11 (×2): qty 1

## 2012-08-11 MED ORDER — METHOCARBAMOL 500 MG PO TABS
500.0000 mg | ORAL_TABLET | Freq: Four times a day (QID) | ORAL | Status: DC | PRN
  Administered 2012-08-12 – 2012-08-13 (×4): 500 mg via ORAL
  Filled 2012-08-11 (×4): qty 1

## 2012-08-11 MED ORDER — NITROGLYCERIN 0.4 MG SL SUBL: 0.4000 mg | SUBLINGUAL_TABLET | SUBLINGUAL | Status: DC | PRN

## 2012-08-11 MED ORDER — NEOSTIGMINE METHYLSULFATE 1 MG/ML IJ SOLN
INTRAMUSCULAR | Status: DC | PRN
  Administered 2012-08-11: 2.5 mg via INTRAVENOUS

## 2012-08-11 MED ORDER — ATORVASTATIN CALCIUM 40 MG PO TABS
40.0000 mg | ORAL_TABLET | Freq: Every day | ORAL | Status: DC
  Administered 2012-08-11 – 2012-08-13 (×3): 40 mg via ORAL
  Filled 2012-08-11 (×3): qty 1

## 2012-08-11 MED ORDER — METOCLOPRAMIDE HCL 10 MG PO TABS: 5.0000 mg | ORAL_TABLET | Freq: Three times a day (TID) | ORAL | Status: DC | PRN

## 2012-08-11 MED ORDER — ROCURONIUM BROMIDE 100 MG/10ML IV SOLN
INTRAVENOUS | Status: DC | PRN
  Administered 2012-08-11: 35 mg via INTRAVENOUS

## 2012-08-11 MED ORDER — DILTIAZEM HCL ER COATED BEADS 360 MG PO CP24
360.0000 mg | ORAL_CAPSULE | Freq: Every day | ORAL | Status: DC
  Administered 2012-08-12: 360 mg via ORAL
  Filled 2012-08-11 (×2): qty 1

## 2012-08-11 MED ORDER — MIDAZOLAM HCL 5 MG/5ML IJ SOLN
INTRAMUSCULAR | Status: DC | PRN
  Administered 2012-08-11: 2 mg via INTRAVENOUS

## 2012-08-11 MED ORDER — ONDANSETRON HCL 4 MG/2ML IJ SOLN
INTRAMUSCULAR | Status: DC | PRN
  Administered 2012-08-11: 4 mg via INTRAVENOUS

## 2012-08-11 MED ORDER — RIVAROXABAN 20 MG PO TABS
20.0000 mg | ORAL_TABLET | Freq: Every day | ORAL | Status: DC
  Administered 2012-08-12: 20 mg via ORAL
  Filled 2012-08-11 (×2): qty 1

## 2012-08-11 MED ORDER — ACETAMINOPHEN 500 MG PO TABS: 500.0000 mg | ORAL_TABLET | Freq: Four times a day (QID) | ORAL | Status: DC | PRN

## 2012-08-11 MED ORDER — GLYCOPYRROLATE 0.2 MG/ML IJ SOLN
INTRAMUSCULAR | Status: DC | PRN
  Administered 2012-08-11: .5 mg via INTRAVENOUS

## 2012-08-11 MED ORDER — PANTOPRAZOLE SODIUM 40 MG PO TBEC
40.0000 mg | DELAYED_RELEASE_TABLET | Freq: Every day | ORAL | Status: DC
  Administered 2012-08-12 – 2012-08-13 (×2): 40 mg via ORAL
  Filled 2012-08-11 (×2): qty 1

## 2012-08-11 MED ORDER — PHENYLEPHRINE HCL 10 MG/ML IJ SOLN
INTRAMUSCULAR | Status: DC | PRN
  Administered 2012-08-11 (×3): 80 ug via INTRAVENOUS

## 2012-08-11 MED ORDER — IRBESARTAN 75 MG PO TABS
75.0000 mg | ORAL_TABLET | Freq: Every day | ORAL | Status: DC
  Administered 2012-08-11 – 2012-08-13 (×3): 75 mg via ORAL
  Filled 2012-08-11 (×3): qty 1

## 2012-08-11 MED ORDER — SODIUM CHLORIDE 0.9 % IV SOLN
10.0000 mg | INTRAVENOUS | Status: DC | PRN
  Administered 2012-08-11: 15 ug/min via INTRAVENOUS

## 2012-08-11 SURGICAL SUPPLY — 72 items
BLADE SAW SAG 73X25 THK (BLADE) ×1
BLADE SAW SGTL 73X25 THK (BLADE) ×1 IMPLANT
BUR SURG 4X8 MED (BURR) IMPLANT
BURR SURG 4X8 MED (BURR)
CEMENT BONE DEPUY (Cement) ×2 IMPLANT
CLOTH BEACON ORANGE TIMEOUT ST (SAFETY) ×2 IMPLANT
COVER SURGICAL LIGHT HANDLE (MISCELLANEOUS) ×2 IMPLANT
DRAPE INCISE IOBAN 66X45 STRL (DRAPES) ×3 IMPLANT
DRAPE U-SHAPE 47X51 STRL (DRAPES) ×2 IMPLANT
DRAPE X-RAY CASS 24X20 (DRAPES) IMPLANT
DRILL BIT 5/64 (BIT) ×2 IMPLANT
DRSG ADAPTIC 3X8 NADH LF (GAUZE/BANDAGES/DRESSINGS) ×2 IMPLANT
DRSG PAD ABDOMINAL 8X10 ST (GAUZE/BANDAGES/DRESSINGS) ×3 IMPLANT
DURAPREP 26ML APPLICATOR (WOUND CARE) ×2 IMPLANT
ELECT BLADE 4.0 EZ CLEAN MEGAD (MISCELLANEOUS) ×4
ELECT NDL TIP 2.8 STRL (NEEDLE) ×1 IMPLANT
ELECT NEEDLE TIP 2.8 STRL (NEEDLE) ×2 IMPLANT
ELECT REM PT RETURN 9FT ADLT (ELECTROSURGICAL) ×2
ELECTRODE BLDE 4.0 EZ CLN MEGD (MISCELLANEOUS) ×1 IMPLANT
ELECTRODE REM PT RTRN 9FT ADLT (ELECTROSURGICAL) ×1 IMPLANT
GLENOID ANCHOR PEG CROSSLK 40 (Orthopedic Implant) ×1 IMPLANT
GLOVE BIOGEL PI ORTHO PRO 7.5 (GLOVE) ×2
GLOVE BIOGEL PI ORTHO PRO SZ8 (GLOVE) ×1
GLOVE ORTHO TXT STRL SZ7.5 (GLOVE) ×3 IMPLANT
GLOVE PI ORTHO PRO STRL 7.5 (GLOVE) ×1 IMPLANT
GLOVE PI ORTHO PRO STRL SZ8 (GLOVE) ×1 IMPLANT
GLOVE SURG ORTHO 8.5 STRL (GLOVE) ×2 IMPLANT
GOWN STRL NON-REIN LRG LVL3 (GOWN DISPOSABLE) ×2 IMPLANT
GOWN STRL REIN XL XLG (GOWN DISPOSABLE) ×5 IMPLANT
HANDPIECE INTERPULSE COAX TIP (DISPOSABLE)
HEAD HUM ECCENTRIC 44X21 STRL (Trauma) ×1 IMPLANT
KIT BASIN OR (CUSTOM PROCEDURE TRAY) ×2 IMPLANT
KIT ROOM TURNOVER OR (KITS) ×2 IMPLANT
MANIFOLD NEPTUNE II (INSTRUMENTS) ×2 IMPLANT
NDL 1/2 CIR MAYO (NEEDLE) ×1 IMPLANT
NDL HYPO 25GX1X1/2 BEV (NEEDLE) ×1 IMPLANT
NDL SUT 6 .5 CRC .975X.05 MAYO (NEEDLE) ×1 IMPLANT
NEEDLE 1/2 CIR MAYO (NEEDLE) ×2 IMPLANT
NEEDLE HYPO 25GX1X1/2 BEV (NEEDLE) ×2 IMPLANT
NEEDLE MAYO TAPER (NEEDLE) ×2
NS IRRIG 1000ML POUR BTL (IV SOLUTION) ×2 IMPLANT
PACK SHOULDER (CUSTOM PROCEDURE TRAY) ×3 IMPLANT
PAD ARMBOARD 7.5X6 YLW CONV (MISCELLANEOUS) ×4 IMPLANT
PIN METAGLENE 2.5 (PIN) ×1 IMPLANT
SET HNDPC FAN SPRY TIP SCT (DISPOSABLE) IMPLANT
SLING ARM FOAM STRAP LRG (SOFTGOODS) ×1 IMPLANT
SLING ARM IMMOBILIZER LRG (SOFTGOODS) ×2 IMPLANT
SLING ARM IMMOBILIZER MED (SOFTGOODS) IMPLANT
SMARTMIX MINI TOWER (MISCELLANEOUS)
SPONGE GAUZE 4X4 12PLY (GAUZE/BANDAGES/DRESSINGS) ×2 IMPLANT
SPONGE LAP 18X18 X RAY DECT (DISPOSABLE) ×2 IMPLANT
SPONGE LAP 4X18 X RAY DECT (DISPOSABLE) ×2 IMPLANT
SPONGE SURGIFOAM ABS GEL SZ50 (HEMOSTASIS) ×1 IMPLANT
STEM HUMERAL 12MM (Trauma) ×1 IMPLANT
STRIP CLOSURE SKIN 1/2X4 (GAUZE/BANDAGES/DRESSINGS) ×2 IMPLANT
SUCTION FRAZIER TIP 10 FR DISP (SUCTIONS) ×2 IMPLANT
SUT FIBERWIRE #2 38 T-5 BLUE (SUTURE) ×6
SUT MNCRL AB 4-0 PS2 18 (SUTURE) ×2 IMPLANT
SUT VIC AB 0 CT1 27 (SUTURE) ×2
SUT VIC AB 0 CT1 27XBRD ANBCTR (SUTURE) ×1 IMPLANT
SUT VIC AB 2-0 CT1 27 (SUTURE) ×2
SUT VIC AB 2-0 CT1 TAPERPNT 27 (SUTURE) ×1 IMPLANT
SUT VICRYL AB 2 0 TIES (SUTURE) ×1 IMPLANT
SUTURE FIBERWR #2 38 T-5 BLUE (SUTURE) ×2 IMPLANT
SYR CONTROL 10ML LL (SYRINGE) ×2 IMPLANT
TAPE CLOTH SURG 6X10 WHT LF (GAUZE/BANDAGES/DRESSINGS) ×1 IMPLANT
TOWEL OR 17X24 6PK STRL BLUE (TOWEL DISPOSABLE) ×2 IMPLANT
TOWEL OR 17X26 10 PK STRL BLUE (TOWEL DISPOSABLE) ×2 IMPLANT
TOWER SMARTMIX MINI (MISCELLANEOUS) ×1 IMPLANT
TRAY FOLEY CATH 14FR (SET/KITS/TRAYS/PACK) ×1 IMPLANT
WATER STERILE IRR 1000ML POUR (IV SOLUTION) ×2 IMPLANT
YANKAUER SUCT BULB TIP NO VENT (SUCTIONS) IMPLANT

## 2012-08-11 NOTE — Preoperative (Signed)
Beta Blockers   Reason not to administer Beta Blockers:Not Applicable 

## 2012-08-11 NOTE — Anesthesia Preprocedure Evaluation (Addendum)
Anesthesia Evaluation  Patient identified by MRN, date of birth, ID band Patient awake    Reviewed: Allergy & Precautions, H&P , NPO status , Patient's Chart, lab work & pertinent test results  Airway Mallampati: I      Dental  (+) Teeth Intact   Pulmonary shortness of breath, with exertion and lying, sleep apnea ,  breath sounds clear to auscultation        Cardiovascular hypertension, Pt. on home beta blockers + dysrhythmias Atrial Fibrillation Rate:Normal     Neuro/Psych  Neuromuscular disease    GI/Hepatic GERD-  Controlled,  Endo/Other    Renal/GU      Musculoskeletal  (+) Fibromyalgia -  Abdominal   Peds  Hematology   Anesthesia Other Findings   Reproductive/Obstetrics                         Anesthesia Physical Anesthesia Plan  ASA: III  Anesthesia Plan: General   Post-op Pain Management:    Induction: Intravenous  Airway Management Planned: Oral ETT  Additional Equipment:   Intra-op Plan:   Post-operative Plan: Extubation in OR  Informed Consent: I have reviewed the patients History and Physical, chart, labs and discussed the procedure including the risks, benefits and alternatives for the proposed anesthesia with the patient or authorized representative who has indicated his/her understanding and acceptance.   Dental advisory given  Plan Discussed with: CRNA and Anesthesiologist  Anesthesia Plan Comments: (Chronic Afib last took xarelto 10/18 Nl LV function (-) Nuclear Stress test 03/18/11 Sleep apnea, does not use CPAP H/O breast Ca 2004, L modified radical mastectomy  Uncomplicated  Plan GA with interscalene block  Kipp Brood, MD)      Anesthesia Quick Evaluation

## 2012-08-11 NOTE — Anesthesia Procedure Notes (Addendum)
Anesthesia Regional Block:  Interscalene brachial plexus block  Pre-Anesthetic Checklist: ,, timeout performed, Correct Patient, Correct Site, Correct Laterality, Correct Procedure, Correct Position, site marked, Risks and benefits discussed,  Surgical consent,  Pre-op evaluation,  At surgeon's request and post-op pain management  Laterality: Right  Prep: chloraprep       Needles:  Injection technique: Single-shot  Needle Type: Echogenic Stimulator Needle      Needle Gauge: 22 and 22 G    Additional Needles:  Procedures: ultrasound guided and nerve stimulator Interscalene brachial plexus block Narrative:  Start time: 08/11/2012 10:10 AM End time: 08/11/2012 10:15 AM Injection made incrementally with aspirations every 5 mL.  Performed by: Personally   Additional Notes: 25 cc 0.5% marcaine 1:200 Epi  Charline Bills, MD  Interscalene brachial plexus block Procedure Name: Intubation Date/Time: 08/11/2012 10:44 AM Performed by: Sharlene Dory E Pre-anesthesia Checklist: Patient identified, Emergency Drugs available, Suction available, Patient being monitored and Timeout performed Patient Re-evaluated:Patient Re-evaluated prior to inductionOxygen Delivery Method: Circle system utilized Preoxygenation: Pre-oxygenation with 100% oxygen Intubation Type: IV induction Ventilation: Mask ventilation without difficulty Laryngoscope Size: Mac and 4 Grade View: Grade I Tube size: 7.0 mm Number of attempts: 1 Airway Equipment and Method: Stylet Placement Confirmation: ETT inserted through vocal cords under direct vision,  positive ETCO2 and breath sounds checked- equal and bilateral Secured at: 21 cm Tube secured with: Tape Dental Injury: Teeth and Oropharynx as per pre-operative assessment

## 2012-08-11 NOTE — Brief Op Note (Signed)
08/11/2012  12:58 PM  PATIENT:  [name]  76 y.o. female  PRE-OPERATIVE DIAGNOSIS:  right shoulder OA, end staged  POST-OPERATIVE DIAGNOSIS:  Right Shoulder Osteoarthritis, end staged  PROCEDURE:  Procedure(s) (LRB) with comments: TOTAL SHOULDER ARTHROPLASTY (Right) - RIGHT  TOTAL SHOULDER  ARTHROPLASTY  , De[employer]uy [employer] with Anchor Peg Glenoid SURGEON:  Surgeon(s) and Role:    * Verlee Rossetti, MD - Primary  PHYSICIAN ASSISTANT:   ASSISTANTS: Thea Gist, PA-C   ANESTHESIA:   regional and general  EBL:  Total I/O In: 1000 [I.V.:1000] Out: 150 [Blood:150]  BLOOD ADMINISTERED:none  DRAINS: none   LOCAL MEDICATIONS USED:  MARCAINE     SPECIMEN:  No Specimen  DISPOSITION OF SPECIMEN:  N/A  COUNTS:  YES  TOURNIQUET:  * No tourniquets in log *  DICTATION: .Other Dictation: Dictation Number [id number]  PLAN OF CARE: Admit to inpatient   PATIENT DISPOSITION:  PACU - hemodynamically stable.   Delay start of Pharmacological VTE agent (>24hrs) due to surgical blood loss or risk of bleeding: not applicable

## 2012-08-11 NOTE — Anesthesia Postprocedure Evaluation (Signed)
  Anesthesia Post-op Note  Patient: [name]  Procedure(s) Performed: Procedure(s) (LRB) with comments: TOTAL SHOULDER ARTHROPLASTY (Right) - RIGHT  TOTAL SHOULDER  ARTHROPLASTY   Patient Location: PACU  Anesthesia Type: General and GA combined with regional for post-op pain  Level of Consciousness: awake, alert  and oriented  Airway and Oxygen Therapy: Patient Spontanous Breathing and Patient connected to nasal cannula oxygen  Post-op Pain: none  Post-op Assessment: Post-op Vital signs reviewed and Patient's Cardiovascular Status Stable  Post-op Vital Signs: stable  Complications: No apparent anesthesia complications

## 2012-08-11 NOTE — Interval H&P Note (Signed)
History and Physical Interval Note:  08/11/2012 10:08 AM  [name]  has presented today for surgery, with the diagnosis of right shoulder OA   The various methods of treatment have been discussed with the patient and family. After consideration of risks, benefits and other options for treatment, the patient has consented to  Procedure(s) (LRB) with comments: TOTAL SHOULDER ARTHROPLASTY (Right) - RIGHT SHOULDER TOTAL SHOULDER ARTHROPLASTY  as a surgical intervention .  The patient's history has been reviewed, patient examined, no change in status, stable for surgery.  I have reviewed the patient's chart and labs.  Questions were answered to the patient's satisfaction.     [name],STEVEN R

## 2012-08-11 NOTE — Transfer of Care (Signed)
Immediate Anesthesia Transfer of Care Note  Patient: [name]  Procedure(s) Performed: Procedure(s) (LRB) with comments: TOTAL SHOULDER ARTHROPLASTY (Right) - RIGHT  TOTAL SHOULDER  ARTHROPLASTY   Patient Location: PACU  Anesthesia Type: General  Level of Consciousness: awake, alert  and oriented  Airway & Oxygen Therapy: Patient Spontanous Breathing and Patient connected to nasal cannula oxygen  Post-op Assessment: Report given to PACU RN, Post -op Vital signs reviewed and stable and Patient moving all extremities  Post vital signs: Reviewed and stable  Complications: No apparent anesthesia complications

## 2012-08-11 NOTE — Discharge Summary (Signed)
Physician Discharge Summary  Patient ID: [name] MRN: [id number] DOB/AGE: 12-21-1935 76 y.o.  Admit date: 08/11/2012 Discharge date: 08/11/2012  Admission Diagnoses:  Principal Problem:  *Osteoarthrosis, unspecified whether generalized or localized, shoulder region   Discharge Diagnoses:  Same   Surgeries: Procedure(s): TOTAL SHOULDER ARTHROPLASTY on 08/11/2012   Consultants: Occupational Therapy, Physical Therapy  Discharged Condition: Stable  Hospital Course: [name] is an 76 y.o. female who was admitted 08/11/2012 with a chief complaint of shoulder pain, and found to have a diagnosis of Osteoarthrosis, unspecified whether generalized or localized, shoulder region.  They were brought to the operating room on 08/11/2012 and underwent the above named procedures.    The patient had an uncomplicated hospital course and was stable for discharge.  Recent vital signs:  Filed Vitals:   08/11/12 0923  BP: 161/90  Pulse: 62  Temp: 97.4 F (36.3 C)  Resp: 18    Recent laboratory studies:  Results for orders placed during the hospital encounter of 08/09/12  SURGICAL PCR SCREEN      Component Value Range   MRSA, PCR NEGATIVE  NEGATIVE   Staphylococcus aureus POSITIVE (*) NEGATIVE  BASIC METABOLIC PANEL      Component Value Range   Sodium 138  135 - 145 mEq/L   Potassium 4.4  3.5 - 5.1 mEq/L   Chloride 100  96 - 112 mEq/L   CO2 27  19 - 32 mEq/L   Glucose, Bld 98  70 - 99 mg/dL   BUN 28 (*) 6 - 23 mg/dL   Creatinine, Ser 7.82  0.50 - 1.10 mg/dL   Calcium 95.6  8.4 - 21.3 mg/dL   GFR calc non Af Amer 53 (*) >90 mL/min   GFR calc Af Amer 61 (*) >90 mL/min  CBC      Component Value Range   WBC 7.1  4.0 - 10.5 K/uL   RBC 4.70  3.87 - 5.11 MIL/uL   Hemoglobin 14.8  12.0 - 15.0 g/dL   HCT 08.6  57.8 - 46.9 %   MCV 91.3  78.0 - 100.0 fL   MCH 31.5  26.0 - 34.0 pg   MCHC 34.5  30.0 - 36.0 g/dL   RDW 62.9  52.8 - 41.3 %   Platelets 211  150 - 400  K/uL  TYPE AND SCREEN      Component Value Range   ABO/RH(D) B POS     Antibody Screen NEG     Sample Expiration 08/12/2012    ABO/RH      Component Value Range   ABO/RH(D) B POS      Discharge Medications:     Medication List     As of 08/11/2012 10:11 AM    ASK your doctor about these medications         acetaminophen 500 MG tablet   Commonly known as: TYLENOL   Take 500 mg by mouth 2 (two) times daily as needed. For shoulder pain      atenolol 25 MG tablet   Commonly known as: TENORMIN   Take 25 mg by mouth daily.      atorvastatin 40 MG tablet   Commonly known as: LIPITOR   Take 40 mg by mouth daily.      diltiazem 360 MG 24 hr capsule   Commonly known as: CARDIZEM CD   Take 360 mg by mouth daily with supper.      furosemide 20 MG tablet   Commonly known as:  LASIX   Take 20 mg by mouth daily.      Glucosamine 500 MG Tabs   Take 500 mg by mouth daily.      nitroGLYCERIN 0.4 MG SL tablet   Commonly known as: NITROSTAT   Place 0.4 mg under the tongue every 5 (five) minutes as needed. For chest pain      olmesartan 20 MG tablet   Commonly known as: BENICAR   Take 10 mg by mouth daily.      omeprazole 20 MG capsule   Commonly known as: PRILOSEC   Take 20 mg by mouth daily.      OVER THE COUNTER MEDICATION   Take 1 tablet by mouth daily. Calcium citrate + Vitamin D 600mg /400 IU      traMADol 50 MG tablet   Commonly known as: ULTRAM   Take 50 mg by mouth 2 (two) times daily. For pain      XARELTO 20 MG Tabs   Generic drug: Rivaroxaban   Take 20 mg by mouth daily with supper.      zolpidem 10 MG tablet   Commonly known as: AMBIEN   Take 10 mg by mouth at bedtime as needed. For sleep        Diagnostic Studies: Dg Chest 2 View  07/19/2012  *RADIOLOGY REPORT*  Clinical Data: Shortness of breath on exertion, orthopnea and history of breast carcinoma.  CHEST - 2 VIEW  Comparison: 01/24/2012  Findings: Stable moderate cardiomegaly without edema or  pleural effusions.  No pulmonary consolidation or nodules identified. Stable mildly ectatic thoracic aorta.  There are stable degenerative changes of the thoracic spine without visualized fracture or lesions.  IMPRESSION: Stable cardiomegaly without acute findings.   Original Report Authenticated By: Reola Calkins, M.D.     Disposition: 01-Home or Self Care        Follow-up Information    Follow up with Aubrey Voong,STEVEN R, MD. Call in 2 weeks. [phone])    Contact information:   [facility] [address], [address] [place] [address] [phone]           Signed: Verlee Rossetti 08/11/2012, 10:11 AM

## 2012-08-12 LAB — BASIC METABOLIC PANEL
BUN: 20 mg/dL (ref 6–23)
CO2: 24 mEq/L (ref 19–32)
Calcium: 9.1 mg/dL (ref 8.4–10.5)
Chloride: 103 mEq/L (ref 96–112)
Creatinine, Ser: 0.9 mg/dL (ref 0.50–1.10)
Glucose, Bld: 91 mg/dL (ref 70–99)

## 2012-08-12 NOTE — Progress Notes (Signed)
I agree with the following treatment note after reviewing documentation.   [name], Taryn Shellhammer Brynn   OTR/L Pager: [phone] Office: [phone] .   

## 2012-08-12 NOTE — Evaluation (Signed)
I agree with the following treatment note after reviewing documentation.   [name], [name]   OTR/L Pager: [phone] Office: [phone] .   

## 2012-08-12 NOTE — Progress Notes (Signed)
Subjective: 1 Day Post-Op Procedure(s) (LRB): TOTAL SHOULDER ARTHROPLASTY (Right) Patient reports pain as 4 on 0-10 scale.   Awake and looks comfortable. OT preparing to work with her. Objective: Vital signs in last 24 hours: Temp:  [97.4 F (36.3 C)-97.6 F (36.4 C)] 97.6 F (36.4 C) (10/26 0658) Pulse Rate:  [46-74] 74  (10/26 0658) Resp:  [16-21] 16  (10/26 0658) BP: (111-144)/(46-75) 127/75 mmHg (10/26 0658) SpO2:  [93 %-98 %] 93 % (10/26 0658)  Intake/Output from previous day: 10/25 0701 - 10/26 0700 In: 2212.5 [I.V.:2112.5; IV Piggyback:100] Out: 150 [Blood:150] Intake/Output this shift:     Basename 08/12/12 0430 08/09/12 1324  HGB 13.3 14.8    Basename 08/12/12 0430 08/09/12 1324  WBC -- 7.1  RBC -- 4.70  HCT 38.5 42.9  PLT -- 211    Basename 08/12/12 0430 08/09/12 1324  NA 138 138  K 4.7 4.4  CL 103 100  CO2 24 27  BUN 20 28*  CREATININE 0.90 1.02  GLUCOSE 91 98  CALCIUM 9.1 10.1   No results found for this basename: LABPT:2,INR:2 in the last 72 hours  Neurologically intact dressing dry pulses 4+ good grip  Assessment/Plan: 1 Day Post-Op Procedure(s) (LRB): TOTAL SHOULDER ARTHROPLASTY (Right) Advance diet OT  Change drwessing tomorrow and per Dr. Ranell Patrick  probably home Sunday [name] [name] 08/12/2012, 10:18 AM

## 2012-08-12 NOTE — Evaluation (Signed)
Occupational Therapy Evaluation Patient Details Name: [name] MRN: [id number] DOB: Nov 12, 1935 Today's Date: 08/12/2012 Time: 6962-9528 OT Time Calculation (min): 34 min  OT Assessment / Plan / Recommendation Clinical Impression  Pt. 76 yo female s/p right TSA. Demonstrates independence with sling wear, ADL's and exercises. Pt at safe level to d/c home. OT to sign off.     OT Assessment  Patient does not need any further OT services    Follow Up Recommendations  No OT follow up    Barriers to Discharge      Equipment Recommendations  None recommended by OT    Recommendations for Other Services    Frequency       Precautions / Restrictions Restrictions Weight Bearing Restrictions: No   Pertinent Vitals/Pain No pain     ADL  Eating/Feeding: Performed;Independent Grooming: Performed;Wash/dry hands;Wash/dry face;Teeth care;Independent Where Assessed - Grooming: Unsupported standing Upper Body Bathing: Performed;Supervision/safety Where Assessed - Upper Body Bathing: Unsupported sitting Toilet Transfer: Simulated;Independent Statistician Method: Sit to stand Tub/Shower Transfer: Landscape architect Method: Science writer: Walk in Scientist, research (physical sciences) Used: Gait belt;Other (comment) (blue sling) Transfers/Ambulation Related to ADLs: Pt. is independent for ambulatino and transfers in room.  ADL Comments: Pt. up and completing grooming taks at sink level indpendently upon OT arrival. Educated on sling wear, how to don/doff, ADL's, and exercises/pendulums. Pt demonstrates independence with sling, and shoulder exercises. Able to verbally recall all exercises at end of session. Pt. educated on how to dress UB and able to verbally instruct caregiver. Completed Berg and scored 54/56.     OT Diagnosis:    OT Problem List:   OT Treatment Interventions:     OT Goals    Visit Information  Last OT Received On:  08/12/12 Assistance Needed: +1    Subjective Data  Subjective: I can't wait to get back to my golf game Patient Stated Goal: To go home    Prior Functioning     Home Living Lives With: Spouse Available Help at Discharge: Family Type of Home: House Home Access: Level entry Home Layout: Two level;Able to live on main level with bedroom/bathroom Bathroom Shower/Tub: Health visitor: Standard Prior Function Level of Independence: Independent Driving: Yes Vocation: Retired Musician: No difficulties Dominant Hand: Right         Vision/Perception     Cognition  Overall Cognitive Status: Appears within functional limits for tasks assessed/performed Arousal/Alertness: Awake/alert Orientation Level: Oriented X4 / Intact Behavior During Session: [facility] for tasks performed    Extremity/Trunk Assessment Trunk Assessment Trunk Assessment: Normal     Mobility Bed Mobility Bed Mobility: Supine to Sit;Sit to Supine Supine to Sit: 7: Independent;HOB flat Sit to Supine: 7: Independent;HOB flat Transfers Transfers: Sit to Stand;Stand to Sit Sit to Stand: 7: Independent;Without upper extremity assist;From bed Stand to Sit: 7: Independent;Without upper extremity assist;To bed     Shoulder Instructions Donning/doffing shirt without moving shoulder: Patient able to independently direct caregiver Method for sponge bathing under operated UE: Supervision/safety Donning/doffing sling/immobilizer: Independent Correct positioning of sling/immobilizer: Independent Pendulum exercises (written home exercise program): Independent ROM for elbow, wrist and digits of operated UE: Independent Sling wearing schedule (on at all times/off for ADL's): Independent Proper positioning of operated UE when showering: Independent Positioning of UE while sleeping: Independent   Exercise Shoulder Exercises Pendulum Exercise: AAROM;Right;Standing Shoulder Flexion:  AAROM;Right;10 reps;Supine Shoulder External Rotation: AAROM;Right;10 reps;Supine Elbow Flexion: AROM;Right Elbow Extension: AROM;Right Wrist Flexion: AROM;Right Wrist Extension: AROM;Right Digit Composite  Flexion: AROM;Right Composite Extension: AROM;Right Neck Flexion: AROM Neck Extension: AROM Neck Lateral Flexion - Right: AROM Neck Lateral Flexion - Left: AROM   Balance Balance Balance Assessed: Yes Standardized Balance Assessment Standardized Balance Assessment: Berg Balance Test Berg Balance Test Sit to Stand: Able to stand without using hands and stabilize independently Standing Unsupported: Able to stand safely 2 minutes Sitting with Back Unsupported but Feet Supported on Floor or Stool: Able to sit safely and securely 2 minutes Stand to Sit: Sits safely with minimal use of hands Transfers: Able to transfer safely, minor use of hands Standing Unsupported with Eyes Closed: Able to stand 10 seconds safely Standing Ubsupported with Feet Together: Able to place feet together independently and stand 1 minute safely From Standing, Reach Forward with Outstretched Arm: Can reach confidently >25 cm (10") From Standing Position, Pick up Object from Floor: Able to pick up shoe safely and easily From Standing Position, Turn to Look Behind Over each Shoulder: Looks behind from both sides and weight shifts well Turn 360 Degrees: Able to turn 360 degrees safely in 4 seconds or less Standing Unsupported, Alternately Place Feet on Step/Stool: Able to stand independently and safely and complete 8 steps in 20 seconds Standing Unsupported, One Foot in Front: Able to plae foot ahead of the other independently and hold 30 seconds Standing on One Leg: Able to lift leg independently and hold 5-10 seconds Total Score: 54    End of Session OT - End of Session Equipment Utilized During Treatment: Gait belt Activity Tolerance: Patient tolerated treatment well Patient left: in bed;with call  bell/phone within reach Nurse Communication: Mobility status  GO     Cleora Fleet 08/12/2012, 11:10 AM

## 2012-08-12 NOTE — Progress Notes (Signed)
Physical Therapy Discharge Patient Details Name: [name] MRN: [id number] DOB: [birth year] Today's Date: 08/12/2012 Time:  -     Patient discharged from PT services secondary to no need for PT at this time.  Pt performed well on BERG with OT.  I also spoke with pt and she reports no issues with mobility and states she has been mobilizing in the room independently.  OT completed education regarding post-op shoulder care.   Pt in agreement.   GP    Sharolyn Douglas [place], [place]  [phone] 08/12/2012  08/12/2012, 1:12 PM

## 2012-08-12 NOTE — Op Note (Signed)
[name], [name] NO.:  [id number]  MEDICAL RECORD NO.:  [id number]  LOCATION:  5N24C                        FACILITY:  [facility]  PHYSICIAN:  Almedia Balls. Ranell Patrick, M.D. DATE OF BIRTH:  [name]  DATE OF PROCEDURE:  08/11/2012 DATE OF DISCHARGE:                              OPERATIVE REPORT   PREOPERATIVE DIAGNOSIS:  Right shoulder end-stage osteoarthritis.  POSTOPERATIVE DIAGNOSIS:  Right shoulder end-stage osteoarthritis.  PROCEDURE PERFORMED:  Right total shoulder arthroplasty using DePuy Global Advantage System with anchor peg glenoid.  ATTENDING SURGEON:  Almedia Balls. Ranell Patrick, MD  ASSISTANT:  Donnie Coffin. Dixon, PA-C, who scrubbed the entire procedure and necessary for satisfactory completion of surgery.  ANESTHESIA:  General anesthesia was used plus interscalene block.  ESTIMATED BLOOD LOSS:  150 mL.  FLUID REPLACEMENT:  1500 mL of crystalloid.  INSTRUMENT COUNTS:  Correct.  There were no complications.  Perioperative antibiotics given.  INDICATIONS:  The patient is a 76 year old female with disabling pain and loss of function secondary to end-stage arthritis of the shoulder. The patient has failed all measures of conservative management including injections, modification of activity, pain medications, and rest.  The patient presents with persistent breakthrough shoulder pain both at night and at rest and it really limits her function impacting ADLs.  The patient presents now for total shoulder arthroplasty.  Informed consent was obtained.  DESCRIPTION OF PROCEDURE:  After adequate level of anesthesia achieved, the patient was positioned in modified beach-chair position.  The right shoulder was correctly identified.  Time-out called.  Sterile prep and drape of the shoulder arm performed.  A deltopectoral incision was created starting at the coracoid process extending down to the anterior humerus using a 10 blade scalpel.  Dissection down through  subcutaneous tissues using Bovie.  The deltoid taken laterally of the cephalic vein. Pectoralis taken medially, conjoined tendon identified and retracted medially as well.  We released the subscap off the lesser tuberosity, subperiosteally using a Bovie.  I placed a #2 FiberWire in the modified Mason-Allen suture technique in the free end of the tendon.  The biceps root was released.  Inferior humeral neck released capsule.  Protected the axillary nerve.  We resected the humerus with 10 degrees of retroversion with the neck resection guide.  The rotator cuff and subscap were intact.  We then broached up to a size 12 stem and then impacted the trial 12 stem in place.  We retracted the humerus posteriorly, did a 360-degree capsulectomy and glenoid labral removal. We then identified the 6 o'clock, 12 o'clock, 3 and 9 positions, locations and then went ahead and sized the glenoid to a size 40.  We drilled our central guide pin and then reamed over the guide pin.  We next went ahead and drilled out central peg hole for the anchor peg glenoid system __________.  We then placed our guide for the peripheral 3 holes, drilled those, placed epi soaked Gelfoam in 3 peripheral holes and then cemented the real size 40 DePuy anchor PEG glenoid and polyethylene insert in place.  Once the cement was allowed to harden, we removed the instruments.  We delivered the shoulder out of the wound and then went ahead and  drilled our holes and placed #2 FiberWire for repair of the subscap due to lesser tuberosity.  Next, we used a thorough irrigation of the shaft. We used impaction grafting technique and impacted the real size 12 press-fit DePuy Global stem into position with about again 10 degrees of retroversion.  We then found the size 44 eccentric to have the best head coverage.  We impacted that trial in place, reduced the shoulder.  We were happy with the 44, 21, and then again removed the trial component and  impacted the real component in place, thoroughly irrigated, reduced the shoulder and then repaired the subscap anatomically with the sutures through bone tunnels as well as suture through the rotator interval.  There was a very stiff upper subscap.  Basically it looked like it may be coracoid humeral ligament there, some remnant to that and that was resected.  The subscap then allowed for about 30 degrees of external rotation.  Nice smooth motion noted.  We repaired that rotator interval.  We repaired the subscap anatomically, thoroughly irrigated, and then repaired the deltopectoral interval with 0 Vicryl suture followed by 2-0 Vicryl subcutaneous closure and 4-0 Monocryl for skin.  Steri-Strips applied followed by sterile dressing.  The patient tolerated the surgery well.     Almedia Balls. Ranell Patrick, M.D.     SRN/MEDQ  D:  08/11/2012  T:  08/12/2012  Job:  [id number]

## 2012-08-12 NOTE — Progress Notes (Signed)
Occupational Therapy Discharge Patient Details Name: [name] MRN: [id number] DOB: Oct 17, 1936 Today's Date: 08/12/2012 Time: 6213-0865 OT Time Calculation (min): 34 min  Patient discharged from OT services secondary to Pt. is independent with ADL's, sling wear and exercises. Pt safe to d/c home at this level. No further acute OT needs at this time.   Please see latest therapy progress note for current level of functioning and progress toward goals.    Progress and discharge plan discussed with patient and/or caregiver: Patient/Caregiver agrees with plan  GO     Cleora Fleet 08/12/2012, 11:11 AM

## 2012-08-13 NOTE — Progress Notes (Signed)
Subjective: Dressing Changed and wound looks fine. Good function in her hand.   Objective: Vital signs in last 24 hours: Temp:  [97.4 F (36.3 C)-98.5 F (36.9 C)] 97.4 F (36.3 C) (10/27 0648) Pulse Rate:  [76-98] 76  (10/27 0648) Resp:  [16-18] 16  (10/27 0648) BP: (116-136)/(64-88) 116/64 mmHg (10/27 0648) SpO2:  [93 %-94 %] 94 % (10/27 0648)  Intake/Output from previous day: 10/26 0701 - 10/27 0700 In: 240 [P.O.:240] Out: -  Intake/Output this shift:     Basename 08/12/12 0430  HGB 13.3    Basename 08/12/12 0430  WBC --  RBC --  HCT 38.5  PLT --    Basename 08/12/12 0430  NA 138  K 4.7  CL 103  CO2 24  BUN 20  CREATININE 0.90  GLUCOSE 91  CALCIUM 9.1   No results found for this basename: LABPT:2,INR:2 in the last 72 hours  Neurologically intact Sensation intact distally No cellulitis present  Assessment/Plan: DC today   Socrates Cahoon A 08/13/2012, 8:50 AM

## 2012-08-14 ENCOUNTER — Encounter (HOSPITAL_COMMUNITY): Payer: Self-pay | Admitting: Orthopedic Surgery

## 2012-08-16 ENCOUNTER — Emergency Department (HOSPITAL_COMMUNITY): Payer: Medicare Other

## 2012-08-16 ENCOUNTER — Encounter (HOSPITAL_COMMUNITY): Payer: Self-pay | Admitting: Emergency Medicine

## 2012-08-16 ENCOUNTER — Emergency Department (HOSPITAL_COMMUNITY)
Admission: EM | Admit: 2012-08-16 | Discharge: 2012-08-17 | Disposition: A | Discharge status: Home / Self Care | Payer: Medicare Other | Attending: Emergency Medicine | Admitting: Emergency Medicine

## 2012-08-16 DIAGNOSIS — K8020 Calculus of gallbladder without cholecystitis without obstruction: Secondary | ICD-10-CM | POA: Diagnosis not present

## 2012-08-16 DIAGNOSIS — K219 Gastro-esophageal reflux disease without esophagitis: Secondary | ICD-10-CM | POA: Insufficient documentation

## 2012-08-16 DIAGNOSIS — IMO0001 Reserved for inherently not codable concepts without codable children: Secondary | ICD-10-CM | POA: Diagnosis not present

## 2012-08-16 DIAGNOSIS — E7800 Pure hypercholesterolemia, unspecified: Secondary | ICD-10-CM | POA: Diagnosis not present

## 2012-08-16 DIAGNOSIS — Z87891 Personal history of nicotine dependence: Secondary | ICD-10-CM | POA: Diagnosis not present

## 2012-08-16 DIAGNOSIS — Z79899 Other long term (current) drug therapy: Secondary | ICD-10-CM | POA: Diagnosis not present

## 2012-08-16 DIAGNOSIS — K5909 Other constipation: Secondary | ICD-10-CM | POA: Insufficient documentation

## 2012-08-16 DIAGNOSIS — K5900 Constipation, unspecified: Secondary | ICD-10-CM | POA: Diagnosis not present

## 2012-08-16 DIAGNOSIS — R0602 Shortness of breath: Secondary | ICD-10-CM | POA: Diagnosis not present

## 2012-08-16 DIAGNOSIS — Z853 Personal history of malignant neoplasm of breast: Secondary | ICD-10-CM | POA: Insufficient documentation

## 2012-08-16 DIAGNOSIS — I4891 Unspecified atrial fibrillation: Secondary | ICD-10-CM

## 2012-08-16 DIAGNOSIS — Z8739 Personal history of other diseases of the musculoskeletal system and connective tissue: Secondary | ICD-10-CM | POA: Insufficient documentation

## 2012-08-16 DIAGNOSIS — I359 Nonrheumatic aortic valve disorder, unspecified: Secondary | ICD-10-CM | POA: Insufficient documentation

## 2012-08-16 DIAGNOSIS — R011 Cardiac murmur, unspecified: Secondary | ICD-10-CM | POA: Insufficient documentation

## 2012-08-16 DIAGNOSIS — Z7901 Long term (current) use of anticoagulants: Secondary | ICD-10-CM | POA: Diagnosis not present

## 2012-08-16 DIAGNOSIS — I10 Essential (primary) hypertension: Secondary | ICD-10-CM | POA: Diagnosis not present

## 2012-08-16 DIAGNOSIS — M129 Arthropathy, unspecified: Secondary | ICD-10-CM | POA: Diagnosis not present

## 2012-08-16 DIAGNOSIS — G4730 Sleep apnea, unspecified: Secondary | ICD-10-CM | POA: Diagnosis not present

## 2012-08-16 DIAGNOSIS — K5903 Drug induced constipation: Secondary | ICD-10-CM

## 2012-08-16 LAB — CBC
HCT: 39.7 % (ref 36.0–46.0)
MCH: 32 pg (ref 26.0–34.0)
MCV: 90 fL (ref 78.0–100.0)
Platelets: 231 10*3/uL (ref 150–400)
RDW: 12.7 % (ref 11.5–15.5)
WBC: 8.4 10*3/uL (ref 4.0–10.5)

## 2012-08-16 LAB — BASIC METABOLIC PANEL
BUN: 17 mg/dL (ref 6–23)
Calcium: 10.1 mg/dL (ref 8.4–10.5)
Chloride: 103 mEq/L (ref 96–112)
Creatinine, Ser: 0.9 mg/dL (ref 0.50–1.10)
GFR calc Af Amer: 71 mL/min — ABNORMAL LOW (ref >90)

## 2012-08-16 LAB — PRO B NATRIURETIC PEPTIDE: Pro B Natriuretic peptide (BNP): 1674 pg/mL — ABNORMAL HIGH (ref 0–125)

## 2012-08-16 NOTE — ED Notes (Signed)
Pt c/o severe constipation and then she started feeling SOB and CP. Pt took 1 Nitro at home and got immediate relief, pt currently denies CP. Pt reports when she manually disimpacted herself she saw blood in her stool.

## 2012-08-16 NOTE — ED Notes (Addendum)
Reports being constipated since Sunday after being released from hospital for shoulder surgery-has had to manual remove; also reports high bp, and SOB starting tonight; reports had CP, took nitro and reports it did relieve CP

## 2012-08-17 DIAGNOSIS — M19019 Primary osteoarthritis, unspecified shoulder: Secondary | ICD-10-CM | POA: Diagnosis not present

## 2012-08-17 DIAGNOSIS — I4891 Unspecified atrial fibrillation: Secondary | ICD-10-CM | POA: Diagnosis not present

## 2012-08-17 DIAGNOSIS — I10 Essential (primary) hypertension: Secondary | ICD-10-CM | POA: Diagnosis not present

## 2012-08-17 DIAGNOSIS — Z96619 Presence of unspecified artificial shoulder joint: Secondary | ICD-10-CM | POA: Diagnosis not present

## 2012-08-17 DIAGNOSIS — Z471 Aftercare following joint replacement surgery: Secondary | ICD-10-CM | POA: Diagnosis not present

## 2012-08-17 MED ORDER — POLYETHYLENE GLYCOL 3350 17 G PO PACK: 17.0000 g | PACK | Freq: Every day | ORAL | Status: DC

## 2012-08-17 MED ORDER — TRAMADOL HCL 50 MG PO TABS
50.0000 mg | ORAL_TABLET | Freq: Once | ORAL | Status: AC
  Administered 2012-08-17: 50 mg via ORAL
  Filled 2012-08-17: qty 1

## 2012-08-17 MED ORDER — DOCUSATE SODIUM 100 MG PO CAPS: 100.0000 mg | ORAL_CAPSULE | Freq: Two times a day (BID) | ORAL | Status: DC

## 2012-08-17 NOTE — ED Provider Notes (Signed)
History     CSN: [id number]  Arrival date & time 08/16/12  2006   First MD Initiated Contact with Patient 08/16/12 2302      Chief Complaint  Patient presents with  . Shortness of Breath    (Consider location/radiation/quality/duration/timing/severity/associated sxs/prior treatment) HPI 76 yo female presents to the ED with complaint of abdominal distension, bloating, constipation since Thursday. She also c/o 20 minute episode of chest pain early this morning around 3 am when she woke due to abd distension.  This afternoon she has had several episodes of elevated bp, 190s/100.  Pt was feeling uncomfortable with these blood pressures.  Pt has sob, but this is at her baseline.  Pt had right shoulder arthroplasty on Friday, has been taking oxycodone.  Pt started taking exlax on Tuesday, doubled the dosing when she was not having a bm.  Pt has had 3 BM since arriving in the ER, and reports her abd distension and bloating has resolved.  She has had no further chest pain, sob, or elevated bp since having several BM   Past Medical History  Diagnosis Date  . Carpal tunnel syndrome   . Hypertension   . Hypercholesteremia   . A-fib   . Infected postoperative breast seroma 2006    s/p drainage  . Breast implant removal status 2006  . S/P carpal tunnel release 2005  . Retroperitoneal hematoma 03/2011  . Arthritis   . Cancer     lt breast  . Shortness of breath   . Sleep apnea     sleep study  08/05/12 unable to use capap may get O2  . GERD (gastroesophageal reflux disease)   . Fibromyalgia   . Complication of anesthesia     extremly claustophobic  . Aortic stenosis     mild to moderate AS by 07/2012 echo  . Heart murmur     mild to mod AS, mild MR, mod TR/PR by 07/2012 echo    Past Surgical History  Procedure Date  . Mastectomy 2004    LEFT  . Carpal tunnel release     right hand  . Appendectomy 1965  . Tubal ligation 1973  . Breast surgery   . Mastectomy, modified radical  w/reconstruction 2004    left breast  . Colonoscopy 2006  . Esophagogastroduodenoscopy 2006  . Total shoulder arthroplasty 08/11/2012    Procedure: TOTAL SHOULDER ARTHROPLASTY;  Surgeon: Verlee Rossetti, MD;  Location: [facility] OR;  Service: Orthopedics;  Laterality: Right;  RIGHT  TOTAL SHOULDER  ARTHROPLASTY     Family History  Problem Relation Age of Onset  . Heart disease Mother   . Heart disease Father     History  Substance Use Topics  . Smoking status: Former Smoker -- 0.2 packs/day for 10 years    Quit date: 08/10/1967  . Smokeless tobacco: Never Used   Comment: quit smoking 1996  quit alcohol 2 yrs  . Alcohol Use: No     SOMETIMES    OB History    Grav Para Term Preterm Abortions TAB SAB Ect Mult Living                  Review of Systems  All other systems reviewed and are negative.    Allergies  Other; Crestor; and Band-aid plus antibiotic  Home Medications   Current Outpatient Rx  Name Route Sig Dispense Refill  . ACETAMINOPHEN 500 MG PO TABS Oral Take 500 mg by mouth 2 (two) times daily as needed.  For shoulder pain    . ATENOLOL 25 MG PO TABS Oral Take 25 mg by mouth daily.    . ATORVASTATIN CALCIUM 40 MG PO TABS Oral Take 40 mg by mouth daily.     Marland Kitchen DILTIAZEM HCL ER COATED BEADS 360 MG PO CP24 Oral Take 360 mg by mouth daily with supper.     . FUROSEMIDE 20 MG PO TABS Oral Take 20 mg by mouth daily.    Marland Kitchen GLUCOSAMINE 500 MG PO TABS Oral Take 500 mg by mouth daily.     Marland Kitchen METHOCARBAMOL 500 MG PO TABS Oral Take 1 tablet (500 mg total) by mouth 3 (three) times daily as needed. 60 tablet 1  . NITROGLYCERIN 0.4 MG SL SUBL Sublingual Place 0.4 mg under the tongue every 5 (five) minutes as needed. For chest pain    . OLMESARTAN MEDOXOMIL 20 MG PO TABS Oral Take 10 mg by mouth daily.    Marland Kitchen OMEPRAZOLE 20 MG PO CPDR Oral Take 20 mg by mouth daily.    . OXYCODONE-ACETAMINOPHEN 5-325 MG PO TABS Oral Take 1 tablet by mouth every 4 (four) hours as needed for pain. 60 tablet  0  . RIVAROXABAN 20 MG PO TABS Oral Take 20 mg by mouth daily with supper.     . TRAMADOL HCL 50 MG PO TABS Oral Take 50 mg by mouth 2 (two) times daily. For pain    . ZOLPIDEM TARTRATE 10 MG PO TABS Oral Take 10 mg by mouth at bedtime as needed. For sleep      BP 189/54  Pulse 90  Temp 97.9 F (36.6 C) (Oral)  Resp 15  SpO2 93%  Physical Exam  Nursing note and vitals reviewed. Constitutional: She is oriented to person, place, and time. She appears well-developed and well-nourished.  HENT:  Head: Normocephalic and atraumatic.  Right Ear: External ear normal.  Left Ear: External ear normal.  Nose: Nose normal.  Mouth/Throat: Oropharynx is clear and moist.  Eyes: Conjunctivae normal and EOM are normal. Pupils are equal, round, and reactive to light.  Neck: Normal range of motion. Neck supple. No JVD present. No tracheal deviation present. No thyromegaly present.  Cardiovascular: Normal rate, regular rhythm, normal heart sounds and intact distal pulses.  Exam reveals no gallop and no friction rub.   No murmur heard. Pulmonary/Chest: Effort normal and breath sounds normal. No stridor. No respiratory distress. She has no wheezes. She has no rales. She exhibits no tenderness.  Abdominal: Soft. Bowel sounds are normal. She exhibits no distension and no mass. There is no tenderness. There is no rebound and no guarding.  Musculoskeletal: She exhibits no edema and no tenderness.       Right shoulder in sling  Lymphadenopathy:    She has no cervical adenopathy.  Neurological: She is alert and oriented to person, place, and time. No cranial nerve deficit. She exhibits normal muscle tone. Coordination normal.  Skin: Skin is warm and dry. No rash noted. No erythema. No pallor.  Psychiatric: She has a normal mood and affect. Her behavior is normal. Judgment and thought content normal.    ED Course  Procedures (including critical care time)  Labs Reviewed  BASIC METABOLIC PANEL - Abnormal;  Notable for the following:    Potassium 3.4 (*)     Glucose, Bld 102 (*)     GFR calc non Af Amer 61 (*)     GFR calc Af Amer 71 (*)     All other  components within normal limits  PRO B NATRIURETIC PEPTIDE - Abnormal; Notable for the following:    Pro B Natriuretic peptide (BNP) 1674.0 (*)     All other components within normal limits  CBC  POCT I-STAT TROPONIN I   Dg Chest 1 View  08/16/2012  *RADIOLOGY REPORT*  Clinical Data: Short of breath.  The atrial fibrillation. Hypertension.  CHEST - 1 VIEW  Comparison: 07/19/2012  Findings: There is left ventricular prominence.  The aorta shows calcification and unfolding.  The lungs are clear.  No edema or effusions.  No significant bony finding.  IMPRESSION: No active disease.  Left ventricular prominence.  Unfolded aorta.   Original Report Authenticated By: Thomasenia Sales, M.D.    Dg Abd 1 View  08/16/2012  *RADIOLOGY REPORT*  Clinical Data: Constipation.  ABDOMEN - 1 VIEW  Comparison: CT abdomen and pelvis 03/20/2011.  Findings: There is a large volume of stool in the transverse colon through the rectum.  No evidence of small bowel obstruction is identified.  Gallstones are noted as seen on CT scan.  IMPRESSION:  1.  Large stool burden descending the transverse colon. 2.  Gallstones.   Original Report Authenticated By: Bernadene Bell. Maricela Curet, M.D.     Date: 08/17/2012  Rate: 86  Rhythm: atrial fibrillation  QRS Axis: normal  Intervals: afib  ST/T Wave abnormalities: nonspecific T wave changes  Conduction Disutrbances:afib  Narrative Interpretation:   Old EKG Reviewed: unchanged     1. Drug-induced constipation   2. Atrial fibrillation   3. Hypertension       MDM  76 yo female with brief cp episode resolved with ntg, sob, and abd distension.  Pt had BNP ordered in triage, no signs of fluid overload, no pulmonary edema, no h/o CHF.  EF 57% on prior testing a year ago.  D/w Dr Terressa Koyanagi, on call for Dr Mayford Knife. He agrees pt does not  require admission based on elevated bnp and brief cp, ok to f/u in the clinic.        Olivia Mackie, MD 08/17/12 [place]

## 2012-08-21 DIAGNOSIS — Z96619 Presence of unspecified artificial shoulder joint: Secondary | ICD-10-CM | POA: Diagnosis not present

## 2012-08-21 DIAGNOSIS — M19019 Primary osteoarthritis, unspecified shoulder: Secondary | ICD-10-CM | POA: Diagnosis not present

## 2012-08-21 DIAGNOSIS — Z471 Aftercare following joint replacement surgery: Secondary | ICD-10-CM | POA: Diagnosis not present

## 2012-08-21 DIAGNOSIS — I4891 Unspecified atrial fibrillation: Secondary | ICD-10-CM | POA: Diagnosis not present

## 2012-08-21 DIAGNOSIS — I10 Essential (primary) hypertension: Secondary | ICD-10-CM | POA: Diagnosis not present

## 2012-08-22 DIAGNOSIS — F411 Generalized anxiety disorder: Secondary | ICD-10-CM | POA: Diagnosis not present

## 2012-08-23 DIAGNOSIS — I10 Essential (primary) hypertension: Secondary | ICD-10-CM | POA: Diagnosis not present

## 2012-08-23 DIAGNOSIS — M19019 Primary osteoarthritis, unspecified shoulder: Secondary | ICD-10-CM | POA: Diagnosis not present

## 2012-08-23 DIAGNOSIS — Z96619 Presence of unspecified artificial shoulder joint: Secondary | ICD-10-CM | POA: Diagnosis not present

## 2012-08-23 DIAGNOSIS — I4891 Unspecified atrial fibrillation: Secondary | ICD-10-CM | POA: Diagnosis not present

## 2012-08-23 DIAGNOSIS — Z471 Aftercare following joint replacement surgery: Secondary | ICD-10-CM | POA: Diagnosis not present

## 2012-08-24 DIAGNOSIS — M19019 Primary osteoarthritis, unspecified shoulder: Secondary | ICD-10-CM | POA: Diagnosis not present

## 2012-08-25 DIAGNOSIS — I4891 Unspecified atrial fibrillation: Secondary | ICD-10-CM | POA: Diagnosis not present

## 2012-08-25 DIAGNOSIS — Z471 Aftercare following joint replacement surgery: Secondary | ICD-10-CM | POA: Diagnosis not present

## 2012-08-25 DIAGNOSIS — I10 Essential (primary) hypertension: Secondary | ICD-10-CM | POA: Diagnosis not present

## 2012-08-25 DIAGNOSIS — M19019 Primary osteoarthritis, unspecified shoulder: Secondary | ICD-10-CM | POA: Diagnosis not present

## 2012-08-25 DIAGNOSIS — Z96619 Presence of unspecified artificial shoulder joint: Secondary | ICD-10-CM | POA: Diagnosis not present

## 2012-08-29 DIAGNOSIS — Z471 Aftercare following joint replacement surgery: Secondary | ICD-10-CM | POA: Diagnosis not present

## 2012-08-29 DIAGNOSIS — M19019 Primary osteoarthritis, unspecified shoulder: Secondary | ICD-10-CM | POA: Diagnosis not present

## 2012-08-29 DIAGNOSIS — Z96619 Presence of unspecified artificial shoulder joint: Secondary | ICD-10-CM | POA: Diagnosis not present

## 2012-08-29 DIAGNOSIS — I4891 Unspecified atrial fibrillation: Secondary | ICD-10-CM | POA: Diagnosis not present

## 2012-08-29 DIAGNOSIS — F411 Generalized anxiety disorder: Secondary | ICD-10-CM | POA: Diagnosis not present

## 2012-08-29 DIAGNOSIS — I10 Essential (primary) hypertension: Secondary | ICD-10-CM | POA: Diagnosis not present

## 2012-09-01 DIAGNOSIS — M19019 Primary osteoarthritis, unspecified shoulder: Secondary | ICD-10-CM | POA: Diagnosis not present

## 2012-09-01 DIAGNOSIS — I4891 Unspecified atrial fibrillation: Secondary | ICD-10-CM | POA: Diagnosis not present

## 2012-09-01 DIAGNOSIS — Z96619 Presence of unspecified artificial shoulder joint: Secondary | ICD-10-CM | POA: Diagnosis not present

## 2012-09-01 DIAGNOSIS — Z471 Aftercare following joint replacement surgery: Secondary | ICD-10-CM | POA: Diagnosis not present

## 2012-09-01 DIAGNOSIS — I10 Essential (primary) hypertension: Secondary | ICD-10-CM | POA: Diagnosis not present

## 2012-09-12 DIAGNOSIS — F411 Generalized anxiety disorder: Secondary | ICD-10-CM | POA: Diagnosis not present

## 2012-09-12 DIAGNOSIS — Z96619 Presence of unspecified artificial shoulder joint: Secondary | ICD-10-CM | POA: Diagnosis not present

## 2012-09-18 DIAGNOSIS — R109 Unspecified abdominal pain: Secondary | ICD-10-CM | POA: Diagnosis not present

## 2012-09-21 DIAGNOSIS — R109 Unspecified abdominal pain: Secondary | ICD-10-CM | POA: Diagnosis not present

## 2012-11-29 DIAGNOSIS — Z96619 Presence of unspecified artificial shoulder joint: Secondary | ICD-10-CM | POA: Diagnosis not present

## 2012-12-02 ENCOUNTER — Other Ambulatory Visit: Payer: Self-pay

## 2012-12-13 DIAGNOSIS — G479 Sleep disorder, unspecified: Secondary | ICD-10-CM | POA: Diagnosis not present

## 2012-12-13 DIAGNOSIS — R109 Unspecified abdominal pain: Secondary | ICD-10-CM | POA: Diagnosis not present

## 2012-12-13 DIAGNOSIS — R142 Eructation: Secondary | ICD-10-CM | POA: Diagnosis not present

## 2012-12-13 DIAGNOSIS — R21 Rash and other nonspecific skin eruption: Secondary | ICD-10-CM | POA: Diagnosis not present

## 2012-12-13 DIAGNOSIS — R141 Gas pain: Secondary | ICD-10-CM | POA: Diagnosis not present

## 2013-01-19 DIAGNOSIS — Z79899 Other long term (current) drug therapy: Secondary | ICD-10-CM | POA: Diagnosis not present

## 2013-01-19 DIAGNOSIS — I359 Nonrheumatic aortic valve disorder, unspecified: Secondary | ICD-10-CM | POA: Diagnosis not present

## 2013-01-19 DIAGNOSIS — I10 Essential (primary) hypertension: Secondary | ICD-10-CM | POA: Diagnosis not present

## 2013-01-19 DIAGNOSIS — I4891 Unspecified atrial fibrillation: Secondary | ICD-10-CM | POA: Diagnosis not present

## 2013-01-19 DIAGNOSIS — Z7901 Long term (current) use of anticoagulants: Secondary | ICD-10-CM | POA: Diagnosis not present

## 2013-01-23 ENCOUNTER — Other Ambulatory Visit (HOSPITAL_COMMUNITY): Payer: Self-pay | Admitting: Family Medicine

## 2013-01-23 DIAGNOSIS — Z1231 Encounter for screening mammogram for malignant neoplasm of breast: Secondary | ICD-10-CM

## 2013-01-24 DIAGNOSIS — E7800 Pure hypercholesterolemia, unspecified: Secondary | ICD-10-CM | POA: Diagnosis not present

## 2013-01-24 DIAGNOSIS — I10 Essential (primary) hypertension: Secondary | ICD-10-CM | POA: Diagnosis not present

## 2013-01-24 DIAGNOSIS — L570 Actinic keratosis: Secondary | ICD-10-CM | POA: Diagnosis not present

## 2013-01-24 DIAGNOSIS — I4891 Unspecified atrial fibrillation: Secondary | ICD-10-CM | POA: Diagnosis not present

## 2013-01-24 DIAGNOSIS — L259 Unspecified contact dermatitis, unspecified cause: Secondary | ICD-10-CM | POA: Diagnosis not present

## 2013-02-06 DIAGNOSIS — M25559 Pain in unspecified hip: Secondary | ICD-10-CM | POA: Diagnosis not present

## 2013-02-19 ENCOUNTER — Ambulatory Visit
Admission: RE | Admit: 2013-02-19 | Discharge: 2013-02-19 | Disposition: A | Discharge status: Home / Self Care | Payer: Medicare Other | Source: Ambulatory Visit | Attending: Family Medicine | Admitting: Family Medicine

## 2013-02-19 ENCOUNTER — Other Ambulatory Visit: Payer: Self-pay | Admitting: Family Medicine

## 2013-02-19 DIAGNOSIS — M47817 Spondylosis without myelopathy or radiculopathy, lumbosacral region: Secondary | ICD-10-CM | POA: Diagnosis not present

## 2013-02-19 DIAGNOSIS — M1610 Unilateral primary osteoarthritis, unspecified hip: Secondary | ICD-10-CM | POA: Diagnosis not present

## 2013-02-19 DIAGNOSIS — R52 Pain, unspecified: Secondary | ICD-10-CM

## 2013-02-19 DIAGNOSIS — M169 Osteoarthritis of hip, unspecified: Secondary | ICD-10-CM | POA: Diagnosis not present

## 2013-03-01 DIAGNOSIS — M1610 Unilateral primary osteoarthritis, unspecified hip: Secondary | ICD-10-CM | POA: Diagnosis not present

## 2013-03-01 DIAGNOSIS — M169 Osteoarthritis of hip, unspecified: Secondary | ICD-10-CM | POA: Diagnosis not present

## 2013-03-01 DIAGNOSIS — M5137 Other intervertebral disc degeneration, lumbosacral region: Secondary | ICD-10-CM | POA: Diagnosis not present

## 2013-03-02 ENCOUNTER — Other Ambulatory Visit (HOSPITAL_COMMUNITY): Payer: Self-pay | Admitting: Family Medicine

## 2013-03-02 ENCOUNTER — Ambulatory Visit (HOSPITAL_COMMUNITY)
Admission: RE | Admit: 2013-03-02 | Discharge: 2013-03-02 | Disposition: A | Discharge status: Home / Self Care | Payer: Medicare Other | Source: Ambulatory Visit | Attending: Family Medicine | Admitting: Family Medicine

## 2013-03-02 DIAGNOSIS — Z1231 Encounter for screening mammogram for malignant neoplasm of breast: Secondary | ICD-10-CM | POA: Insufficient documentation

## 2013-03-27 DIAGNOSIS — M169 Osteoarthritis of hip, unspecified: Secondary | ICD-10-CM | POA: Diagnosis not present

## 2013-03-27 DIAGNOSIS — M5137 Other intervertebral disc degeneration, lumbosacral region: Secondary | ICD-10-CM | POA: Diagnosis not present

## 2013-03-27 DIAGNOSIS — M1610 Unilateral primary osteoarthritis, unspecified hip: Secondary | ICD-10-CM | POA: Diagnosis not present

## 2013-03-29 DIAGNOSIS — M1610 Unilateral primary osteoarthritis, unspecified hip: Secondary | ICD-10-CM | POA: Diagnosis not present

## 2013-03-29 DIAGNOSIS — M169 Osteoarthritis of hip, unspecified: Secondary | ICD-10-CM | POA: Diagnosis not present

## 2013-05-23 ENCOUNTER — Other Ambulatory Visit: Payer: Self-pay

## 2013-06-14 ENCOUNTER — Other Ambulatory Visit (HOSPITAL_COMMUNITY): Payer: Self-pay | Admitting: Orthopedic Surgery

## 2013-06-14 ENCOUNTER — Ambulatory Visit (HOSPITAL_COMMUNITY)
Admission: RE | Admit: 2013-06-14 | Discharge: 2013-06-14 | Disposition: A | Discharge status: Home / Self Care | Payer: Medicare Other | Source: Ambulatory Visit | Attending: Cardiovascular Disease | Admitting: Cardiovascular Disease

## 2013-06-14 DIAGNOSIS — M7989 Other specified soft tissue disorders: Secondary | ICD-10-CM

## 2013-06-14 DIAGNOSIS — M79609 Pain in unspecified limb: Secondary | ICD-10-CM | POA: Diagnosis not present

## 2013-06-14 DIAGNOSIS — M79662 Pain in left lower leg: Secondary | ICD-10-CM

## 2013-06-14 DIAGNOSIS — M87059 Idiopathic aseptic necrosis of unspecified femur: Secondary | ICD-10-CM | POA: Diagnosis not present

## 2013-06-14 DIAGNOSIS — M1610 Unilateral primary osteoarthritis, unspecified hip: Secondary | ICD-10-CM | POA: Diagnosis not present

## 2013-06-14 DIAGNOSIS — M169 Osteoarthritis of hip, unspecified: Secondary | ICD-10-CM | POA: Diagnosis not present

## 2013-06-14 NOTE — Progress Notes (Signed)
Left Lower Ex. Venous Duplex Completed. Negative for DVT. Marilynne Halsted, BS, RDMS, RVT

## 2013-06-21 DIAGNOSIS — H35379 Puckering of macula, unspecified eye: Secondary | ICD-10-CM | POA: Diagnosis not present

## 2013-06-27 DIAGNOSIS — Z7901 Long term (current) use of anticoagulants: Secondary | ICD-10-CM | POA: Diagnosis not present

## 2013-06-27 DIAGNOSIS — C50919 Malignant neoplasm of unspecified site of unspecified female breast: Secondary | ICD-10-CM | POA: Diagnosis not present

## 2013-06-27 DIAGNOSIS — M1990 Unspecified osteoarthritis, unspecified site: Secondary | ICD-10-CM | POA: Diagnosis not present

## 2013-06-27 DIAGNOSIS — I10 Essential (primary) hypertension: Secondary | ICD-10-CM | POA: Diagnosis not present

## 2013-06-27 DIAGNOSIS — E7800 Pure hypercholesterolemia, unspecified: Secondary | ICD-10-CM | POA: Diagnosis not present

## 2013-06-27 DIAGNOSIS — Z01818 Encounter for other preprocedural examination: Secondary | ICD-10-CM | POA: Diagnosis not present

## 2013-06-27 DIAGNOSIS — I359 Nonrheumatic aortic valve disorder, unspecified: Secondary | ICD-10-CM | POA: Diagnosis not present

## 2013-06-27 DIAGNOSIS — I4891 Unspecified atrial fibrillation: Secondary | ICD-10-CM | POA: Diagnosis not present

## 2013-07-03 DIAGNOSIS — M1990 Unspecified osteoarthritis, unspecified site: Secondary | ICD-10-CM | POA: Insufficient documentation

## 2013-07-03 DIAGNOSIS — I350 Nonrheumatic aortic (valve) stenosis: Secondary | ICD-10-CM | POA: Insufficient documentation

## 2013-07-03 DIAGNOSIS — M797 Fibromyalgia: Secondary | ICD-10-CM | POA: Insufficient documentation

## 2013-07-04 ENCOUNTER — Encounter (HOSPITAL_COMMUNITY): Payer: Self-pay | Admitting: Pharmacy Technician

## 2013-07-05 DIAGNOSIS — G4733 Obstructive sleep apnea (adult) (pediatric): Secondary | ICD-10-CM | POA: Diagnosis not present

## 2013-07-06 ENCOUNTER — Encounter: Payer: Self-pay | Admitting: Cardiology

## 2013-07-06 DIAGNOSIS — K661 Hemoperitoneum: Secondary | ICD-10-CM

## 2013-07-06 DIAGNOSIS — M545 Low back pain, unspecified: Secondary | ICD-10-CM | POA: Insufficient documentation

## 2013-07-06 DIAGNOSIS — T4145XA Adverse effect of unspecified anesthetic, initial encounter: Secondary | ICD-10-CM

## 2013-07-06 DIAGNOSIS — I350 Nonrheumatic aortic (valve) stenosis: Secondary | ICD-10-CM | POA: Insufficient documentation

## 2013-07-06 DIAGNOSIS — IMO0002 Reserved for concepts with insufficient information to code with codable children: Secondary | ICD-10-CM | POA: Insufficient documentation

## 2013-07-06 DIAGNOSIS — R0602 Shortness of breath: Secondary | ICD-10-CM | POA: Insufficient documentation

## 2013-07-06 DIAGNOSIS — I4891 Unspecified atrial fibrillation: Secondary | ICD-10-CM | POA: Insufficient documentation

## 2013-07-06 DIAGNOSIS — IMO0001 Reserved for inherently not codable concepts without codable children: Secondary | ICD-10-CM

## 2013-07-06 DIAGNOSIS — E7800 Pure hypercholesterolemia, unspecified: Secondary | ICD-10-CM

## 2013-07-06 DIAGNOSIS — G459 Transient cerebral ischemic attack, unspecified: Secondary | ICD-10-CM

## 2013-07-06 DIAGNOSIS — K219 Gastro-esophageal reflux disease without esophagitis: Secondary | ICD-10-CM

## 2013-07-06 DIAGNOSIS — Z780 Asymptomatic menopausal state: Secondary | ICD-10-CM | POA: Insufficient documentation

## 2013-07-06 DIAGNOSIS — Z9889 Other specified postprocedural states: Secondary | ICD-10-CM | POA: Insufficient documentation

## 2013-07-06 DIAGNOSIS — C50919 Malignant neoplasm of unspecified site of unspecified female breast: Secondary | ICD-10-CM | POA: Insufficient documentation

## 2013-07-06 DIAGNOSIS — Z9886 Personal history of breast implant removal: Secondary | ICD-10-CM

## 2013-07-06 DIAGNOSIS — T8859XA Other complications of anesthesia, initial encounter: Secondary | ICD-10-CM | POA: Insufficient documentation

## 2013-07-06 DIAGNOSIS — C801 Malignant (primary) neoplasm, unspecified: Secondary | ICD-10-CM | POA: Insufficient documentation

## 2013-07-06 NOTE — Patient Instructions (Signed)
[name]  07/06/2013   Your procedure is scheduled on:  07/17/13             Surgery 135pm-305pm  Report to [facility] at     1030 AM.  Call this number if you have problems the morning of surgery: [phone]   Remember:   Do not eat food after midnite.                May have clear liquids until 0630am then npo.     Take these medicines the morning of surgery with A SIP OF WATER:    Do not wear jewelry, make-up or nail polish.  Do not wear lotions, powders, or perfumes.   Do not shave 48 hours prior to surgery.   Do not bring valuables to the hospital.  Contacts, dentures or bridgework may not be worn into surgery.  Leave suitcase in the car. After surgery it may be brought to your room.  For patients admitted to the hospital, checkout time is 11:00 AM the day of  discharge.   SEE CHG INSTRUCTION SHEET    Please read over the following fact sheets that you were given: MRSA Information, coughing and deep breathing exercises, leg exercises, Blood Transfusion Fact sheet, Incentive Spirometry Fact sheet               Failure to comply with these instructions may result in cancellation of your surgery.                Patient Signature ____________________________              Nurse Signature _____________________________

## 2013-07-09 ENCOUNTER — Encounter (HOSPITAL_COMMUNITY)
Admission: RE | Admit: 2013-07-09 | Discharge: 2013-07-09 | Disposition: A | Discharge status: Home / Self Care | Payer: Medicare Other | Source: Ambulatory Visit | Attending: Orthopedic Surgery | Admitting: Orthopedic Surgery

## 2013-07-09 ENCOUNTER — Encounter (HOSPITAL_COMMUNITY): Payer: Self-pay

## 2013-07-09 DIAGNOSIS — Z01812 Encounter for preprocedural laboratory examination: Secondary | ICD-10-CM | POA: Insufficient documentation

## 2013-07-09 DIAGNOSIS — R32 Unspecified urinary incontinence: Secondary | ICD-10-CM | POA: Diagnosis not present

## 2013-07-09 DIAGNOSIS — Z01818 Encounter for other preprocedural examination: Secondary | ICD-10-CM | POA: Insufficient documentation

## 2013-07-09 LAB — PROTIME-INR: Prothrombin Time: 17.4 seconds — ABNORMAL HIGH (ref 11.6–15.2)

## 2013-07-09 LAB — BASIC METABOLIC PANEL
BUN: 22 mg/dL (ref 6–23)
CO2: 27 mEq/L (ref 19–32)
Chloride: 102 mEq/L (ref 96–112)
Creatinine, Ser: 0.99 mg/dL (ref 0.50–1.10)
GFR calc Af Amer: 63 mL/min — ABNORMAL LOW (ref >90)
Glucose, Bld: 90 mg/dL (ref 70–99)

## 2013-07-09 LAB — CBC
HCT: 39.4 % (ref 36.0–46.0)
MCH: 33.2 pg (ref 26.0–34.0)
MCHC: 34 g/dL (ref 30.0–36.0)
MCV: 97.5 fL (ref 78.0–100.0)
RDW: 13.2 % (ref 11.5–15.5)
WBC: 7.8 10*3/uL (ref 4.0–10.5)

## 2013-07-09 LAB — SURGICAL PCR SCREEN: MRSA, PCR: NEGATIVE

## 2013-07-09 LAB — URINALYSIS, ROUTINE W REFLEX MICROSCOPIC
Glucose, UA: NEGATIVE mg/dL
Hgb urine dipstick: NEGATIVE
Ketones, ur: NEGATIVE mg/dL
Leukocytes, UA: NEGATIVE
pH: 5 (ref 5.0–8.0)

## 2013-07-09 LAB — URINE MICROSCOPIC-ADD ON

## 2013-07-09 LAB — APTT: aPTT: 43 seconds — ABNORMAL HIGH (ref 24–37)

## 2013-07-09 NOTE — Progress Notes (Signed)
PT,PTT and Urinalysis with micro faxed via EPIC to Dr Charlann Boxer.

## 2013-07-09 NOTE — Progress Notes (Signed)
Requested Sleep Study done at [facility]- [phone].

## 2013-07-09 NOTE — Progress Notes (Signed)
EKG 08/22/12 EPIC  CXR 07/19/12 [facility]  ECHO 07/28/12 [facility]  DR Turner LOV note 07/20/12 on chart

## 2013-07-10 NOTE — Progress Notes (Signed)
Patient made aware that pcr screen positive for staph and made aware Dr Charlann Boxer would be calling her also and calling in a prescription for Mupriocin ointment.  Also informed patient that if CPAP machine is started prior to surgery to bring CPAP mask and tubing with her day of surgery.  Patient voiced understanding.  Also requestioned her regarding allergies of adhesive bandaid with bacitracin and polymixin.  Patient stated she is only allergic to the bandaid part.  Corrected in EPIC.

## 2013-07-13 NOTE — Progress Notes (Signed)
Husband called on 07/12/13 regarding no call received from [facility] regarding positive pcr for staph.  Instructed patient to call office and nurse refaxed positive pcr lab results.

## 2013-07-14 ENCOUNTER — Other Ambulatory Visit: Payer: Self-pay | Admitting: Cardiology

## 2013-07-14 DIAGNOSIS — I4891 Unspecified atrial fibrillation: Secondary | ICD-10-CM

## 2013-07-14 DIAGNOSIS — Z79899 Other long term (current) drug therapy: Secondary | ICD-10-CM

## 2013-07-15 NOTE — H&P (Signed)
TOTAL HIP ADMISSION H&P  Patient is admitted for left total hip arthroplasty, anterior approach.  Subjective:  Chief Complaint: Left hip OA / pain  HPI: [name], 77 y.o. female, has a history of pain and functional disability in the left hip(s) due to arthritis and patient has failed non-surgical conservative treatments for greater than 12 weeks to include NSAID's and/or analgesics, corticosteriod injections and activity modification.  Onset of symptoms was gradual starting years ago with rapidlly worsening over the last 6 months.The patient noted no past surgery on the left hip(s).  Patient currently rates pain in the left hip at 10 out of 10 with activity. Patient has night pain, worsening of pain with activity and weight bearing, trendelenberg gait, pain that interfers with activities of daily living and pain with passive range of motion. Patient has evidence of periarticular osteophytes and joint space narrowing by imaging studies. This condition presents safety issues increasing the risk of falls. There is no current active infection.  Risks, benefits and expectations were discussed with the patient. Patient understand the risks, benefits and expectations and wishes to proceed with surgery.   D/C Plans:   Home with HHPT  Post-op Meds:    No Rx given  Tranexamic Acid:   Not to be given - CAD  Decadron:    To be given  FYI:    Xarelto 20 mg post-op (on pre-operatively)  Patient Active Problem List   Diagnosis Date Noted  . Hypercholesteremia   . Infected postoperative breast seroma   . Breast implant removal status   . S/P carpal tunnel release   . Cancer   . Shortness of breath   . GERD (gastroesophageal reflux disease)   . Complication of anesthesia   . Hypercholesterolemia   . Menopause   . Breast cancer   . Lower back pain   . A-fib   . TIA (transient ischemic attack)   . Acquired stenosis of aortic valve   . Heart murmur   . Aortic stenosis   . Fibromyalgia    . Sleep apnea   . Arthritis   . Osteoarthrosis, unspecified whether generalized or localized, shoulder region 08/11/2012  . hx: breast cancer, left, invasive lobular, multifocal, receptor + her 2 - 11/15/2011  . Persistent atrial fibrillation 11/13/2011  . Chest pain 11/13/2011  . HTN (hypertension) 11/13/2011  . Retroperitoneal hematoma 03/19/2011   Past Medical History  Diagnosis Date  . Carpal tunnel syndrome     Migrated  . Hypertension   . Hypercholesteremia   . Infected postoperative breast seroma 2006    s/p drainage  . Breast implant removal status 2006  . S/P carpal tunnel release 2005  . Retroperitoneal hematoma 03/2011  . Arthritis   . Cancer     lt breast  . GERD (gastroesophageal reflux disease)   . Fibromyalgia   . Complication of anesthesia     extremly claustophobic  . Aortic stenosis     mild to moderate AS by 07/2012 echo  . Heart murmur     mild to mod AS, mild MR, mod TR/PR by 07/2012 echo  . Hypercholesterolemia   . Menopause   . Breast cancer     ,Multifocal  . Lower back pain     Migrated  . A-fib     controlled  . TIA (transient ischemic attack)   . Acquired stenosis of aortic valve   . Shortness of breath     with exertion   . Sleep apnea  cpap ordered on 07/06/13   . Urinary incontinence     Past Surgical History  Procedure Laterality Date  . Mastectomy  2004    LEFT  . Carpal tunnel release      right hand  . Appendectomy  1965  . Tubal ligation  1973  . Mastectomy, modified radical w/reconstruction  2004    left breast  . Colonoscopy  2006  . Esophagogastroduodenoscopy  2006  . Total shoulder arthroplasty  08/11/2012    Procedure: TOTAL SHOULDER ARTHROPLASTY;  Surgeon: Verlee Rossetti, MD;  Location: [facility] OR;  Service: Orthopedics;  Laterality: Right;  RIGHT  TOTAL SHOULDER  ARTHROPLASTY   . Right shoulder replacement       2013   . Tummy tuck       1987  . Breast surgery    . Implant infection      13 surgeries for  implant infection in 2005     No prescriptions prior to admission   Allergies  Allergen Reactions  . Beta Adrenergic Blockers Shortness Of Breath  . Betapace [Sotalol Hcl] Shortness Of Breath  . Other     Narcotic pain medications - anxiety attacks  . Oxycodone Shortness Of Breath  . Crestor [Rosuvastatin Calcium] Other (See Comments)    cough  . Adhesive [Tape] Rash    Adhesive bandaids   . Clonidine Derivatives Palpitations    History  Substance Use Topics  . Smoking status: Former Smoker -- 0.25 packs/day for 10 years    Quit date: 08/10/1967  . Smokeless tobacco: Never Used     Comment: quit smoking 1996  quit alcohol 2 yrs  . Alcohol Use: No    Family History  Problem Relation Age of Onset  . Heart disease Mother   . Heart disease Father      Review of Systems  Constitutional: Negative.   HENT: Negative.   Eyes: Negative.   Respiratory: Negative.   Cardiovascular: Negative.   Gastrointestinal: Positive for heartburn.  Genitourinary: Negative.   Musculoskeletal: Positive for joint pain.  Skin: Negative.   Neurological: Negative.   Endo/Heme/Allergies: Negative.   Psychiatric/Behavioral: Negative.     Objective:  Physical Exam  Constitutional: She is oriented to person, place, and time. She appears well-developed and well-nourished.  HENT:  Head: Normocephalic and atraumatic.  Mouth/Throat: Oropharynx is clear and moist.  Eyes: Pupils are equal, round, and reactive to light.  Neck: Neck supple. No JVD present. No tracheal deviation present. No thyromegaly present.  Cardiovascular: Normal rate, regular rhythm and intact distal pulses.   Murmur heard. Respiratory: Effort normal and breath sounds normal. No stridor. No respiratory distress. She has no wheezes.  GI: Soft. There is no tenderness. There is no guarding.  Musculoskeletal:       Left hip: She exhibits decreased range of motion, decreased strength, tenderness and bony tenderness. She exhibits no  swelling, no deformity and no laceration.  Lymphadenopathy:    She has no cervical adenopathy.  Neurological: She is alert and oriented to person, place, and time.  Skin: Skin is warm and dry.  Psychiatric: She has a normal mood and affect.     Labs:  Estimated body mass index is 31.22 kg/(m^2) as calculated from the following:   Height as of 08/09/12: 5\' 7"  (1.702 m).   Weight as of 08/09/12: 90.436 kg (199 lb 6 oz).   Imaging Review Plain radiographs demonstrate severe degenerative joint disease of the left hip(s). The bone quality appears to  be good for age and reported activity level.  Assessment/Plan:  End stage arthritis, left hip(s)  The patient history, physical examination, clinical judgement of the provider and imaging studies are consistent with end stage degenerative joint disease of the left hip(s) and total hip arthroplasty is deemed medically necessary. The treatment options including medical management, injection therapy, arthroscopy and arthroplasty were discussed at length. The risks and benefits of total hip arthroplasty were presented and reviewed. The risks due to aseptic loosening, infection, stiffness, dislocation/subluxation,  thromboembolic complications and other imponderables were discussed.  The patient acknowledged the explanation, agreed to proceed with the plan and consent was signed. Patient is being admitted for inpatient treatment for surgery, pain control, PT, OT, prophylactic antibiotics, VTE prophylaxis, progressive ambulation and ADL's and discharge planning.The patient is planning to be discharged home with home health services.     Anastasio Auerbach [name]   PAC  07/15/2013, 10:15 PM

## 2013-07-17 ENCOUNTER — Inpatient Hospital Stay (HOSPITAL_COMMUNITY)
Admission: RE | Admit: 2013-07-17 | Discharge: 2013-07-19 | DRG: 470 | Disposition: A | Discharge status: Home Health | Payer: Medicare Other | Source: Ambulatory Visit | Attending: Orthopedic Surgery | Admitting: Orthopedic Surgery

## 2013-07-17 ENCOUNTER — Encounter (HOSPITAL_COMMUNITY)
Admission: RE | Disposition: A | Discharge status: Home Health | Payer: Self-pay | Source: Ambulatory Visit | Attending: Orthopedic Surgery

## 2013-07-17 ENCOUNTER — Encounter (HOSPITAL_COMMUNITY): Payer: Self-pay | Admitting: *Deleted

## 2013-07-17 ENCOUNTER — Inpatient Hospital Stay (HOSPITAL_COMMUNITY): Payer: Medicare Other | Admitting: Anesthesiology

## 2013-07-17 ENCOUNTER — Inpatient Hospital Stay (HOSPITAL_COMMUNITY): Payer: Medicare Other

## 2013-07-17 ENCOUNTER — Encounter (HOSPITAL_COMMUNITY): Payer: Self-pay | Admitting: Anesthesiology

## 2013-07-17 DIAGNOSIS — I10 Essential (primary) hypertension: Secondary | ICD-10-CM | POA: Diagnosis not present

## 2013-07-17 DIAGNOSIS — I4891 Unspecified atrial fibrillation: Secondary | ICD-10-CM | POA: Diagnosis present

## 2013-07-17 DIAGNOSIS — D62 Acute posthemorrhagic anemia: Secondary | ICD-10-CM | POA: Diagnosis not present

## 2013-07-17 DIAGNOSIS — D500 Iron deficiency anemia secondary to blood loss (chronic): Secondary | ICD-10-CM | POA: Diagnosis not present

## 2013-07-17 DIAGNOSIS — E7800 Pure hypercholesterolemia, unspecified: Secondary | ICD-10-CM | POA: Diagnosis present

## 2013-07-17 DIAGNOSIS — G4730 Sleep apnea, unspecified: Secondary | ICD-10-CM | POA: Diagnosis present

## 2013-07-17 DIAGNOSIS — Z471 Aftercare following joint replacement surgery: Secondary | ICD-10-CM | POA: Diagnosis not present

## 2013-07-17 DIAGNOSIS — Z8673 Personal history of transient ischemic attack (TIA), and cerebral infarction without residual deficits: Secondary | ICD-10-CM | POA: Diagnosis not present

## 2013-07-17 DIAGNOSIS — M169 Osteoarthritis of hip, unspecified: Secondary | ICD-10-CM | POA: Diagnosis present

## 2013-07-17 DIAGNOSIS — Z9010 Acquired absence of unspecified breast and nipple: Secondary | ICD-10-CM | POA: Diagnosis not present

## 2013-07-17 DIAGNOSIS — M1610 Unilateral primary osteoarthritis, unspecified hip: Principal | ICD-10-CM | POA: Diagnosis present

## 2013-07-17 DIAGNOSIS — Z6831 Body mass index (BMI) 31.0-31.9, adult: Secondary | ICD-10-CM

## 2013-07-17 DIAGNOSIS — IMO0001 Reserved for inherently not codable concepts without codable children: Secondary | ICD-10-CM | POA: Diagnosis present

## 2013-07-17 DIAGNOSIS — E669 Obesity, unspecified: Secondary | ICD-10-CM | POA: Diagnosis not present

## 2013-07-17 DIAGNOSIS — K219 Gastro-esophageal reflux disease without esophagitis: Secondary | ICD-10-CM | POA: Diagnosis present

## 2013-07-17 DIAGNOSIS — G5600 Carpal tunnel syndrome, unspecified upper limb: Secondary | ICD-10-CM | POA: Diagnosis not present

## 2013-07-17 DIAGNOSIS — C50919 Malignant neoplasm of unspecified site of unspecified female breast: Secondary | ICD-10-CM | POA: Diagnosis present

## 2013-07-17 DIAGNOSIS — Z96649 Presence of unspecified artificial hip joint: Secondary | ICD-10-CM

## 2013-07-17 DIAGNOSIS — M25559 Pain in unspecified hip: Secondary | ICD-10-CM | POA: Diagnosis not present

## 2013-07-17 HISTORY — PX: TOTAL HIP ARTHROPLASTY: SHX124

## 2013-07-17 LAB — TYPE AND SCREEN: ABO/RH(D): B POS

## 2013-07-17 SURGERY — ARTHROPLASTY, HIP, TOTAL, ANTERIOR APPROACH
Anesthesia: General | Site: Hip | Laterality: Left | Wound class: Clean

## 2013-07-17 MED ORDER — TRAMADOL HCL 50 MG PO TABS: 50.0000 mg | ORAL_TABLET | Freq: Four times a day (QID) | ORAL | Status: DC | PRN

## 2013-07-17 MED ORDER — METOCLOPRAMIDE HCL 10 MG PO TABS: 5.0000 mg | ORAL_TABLET | Freq: Three times a day (TID) | ORAL | Status: DC | PRN

## 2013-07-17 MED ORDER — DIPHENHYDRAMINE HCL 25 MG PO CAPS: 25.0000 mg | ORAL_CAPSULE | Freq: Four times a day (QID) | ORAL | Status: DC | PRN

## 2013-07-17 MED ORDER — ONDANSETRON HCL 4 MG/2ML IJ SOLN
INTRAMUSCULAR | Status: DC | PRN
  Administered 2013-07-17: 4 mg via INTRAVENOUS

## 2013-07-17 MED ORDER — SODIUM CHLORIDE 0.9 % IV SOLN
100.0000 mL/h | INTRAVENOUS | Status: DC
  Administered 2013-07-17: 100 mL/h via INTRAVENOUS
  Filled 2013-07-17 (×9): qty 1000

## 2013-07-17 MED ORDER — DOCUSATE SODIUM 100 MG PO CAPS
100.0000 mg | ORAL_CAPSULE | Freq: Two times a day (BID) | ORAL | Status: DC
  Administered 2013-07-17 – 2013-07-19 (×4): 100 mg via ORAL

## 2013-07-17 MED ORDER — ROCURONIUM BROMIDE 100 MG/10ML IV SOLN
INTRAVENOUS | Status: DC | PRN
  Administered 2013-07-17: 10 mg via INTRAVENOUS
  Administered 2013-07-17: 50 mg via INTRAVENOUS

## 2013-07-17 MED ORDER — METOCLOPRAMIDE HCL 5 MG/ML IJ SOLN: 5.0000 mg | Freq: Three times a day (TID) | INTRAMUSCULAR | Status: DC | PRN

## 2013-07-17 MED ORDER — DEXAMETHASONE SODIUM PHOSPHATE 10 MG/ML IJ SOLN: 10.0000 mg | Freq: Once | INTRAMUSCULAR | Status: DC

## 2013-07-17 MED ORDER — CEFAZOLIN SODIUM-DEXTROSE 2-3 GM-% IV SOLR
INTRAVENOUS | Status: AC
  Filled 2013-07-17: qty 50

## 2013-07-17 MED ORDER — LACTATED RINGERS IV SOLN
INTRAVENOUS | Status: DC
  Administered 2013-07-17: 1000 mL via INTRAVENOUS
  Administered 2013-07-17: 13:00:00 via INTRAVENOUS

## 2013-07-17 MED ORDER — PANTOPRAZOLE SODIUM 40 MG PO TBEC
40.0000 mg | DELAYED_RELEASE_TABLET | Freq: Every day | ORAL | Status: DC
  Administered 2013-07-18 – 2013-07-19 (×2): 40 mg via ORAL
  Filled 2013-07-17 (×3): qty 1

## 2013-07-17 MED ORDER — CHLORHEXIDINE GLUCONATE 4 % EX LIQD
60.0000 mL | Freq: Once | CUTANEOUS | Status: DC
  Filled 2013-07-17: qty 60

## 2013-07-17 MED ORDER — GLYCOPYRROLATE 0.2 MG/ML IJ SOLN
INTRAMUSCULAR | Status: DC | PRN
  Administered 2013-07-17: .3 mg via INTRAVENOUS

## 2013-07-17 MED ORDER — HYDROMORPHONE HCL 2 MG PO TABS
2.0000 mg | ORAL_TABLET | ORAL | Status: DC
  Administered 2013-07-17 – 2013-07-19 (×11): 2 mg via ORAL
  Filled 2013-07-17 (×11): qty 1

## 2013-07-17 MED ORDER — HYDROMORPHONE HCL PF 1 MG/ML IJ SOLN
INTRAMUSCULAR | Status: DC | PRN
  Administered 2013-07-17 (×4): 0.5 mg via INTRAVENOUS

## 2013-07-17 MED ORDER — ONDANSETRON HCL 4 MG PO TABS: 4.0000 mg | ORAL_TABLET | Freq: Four times a day (QID) | ORAL | Status: DC | PRN

## 2013-07-17 MED ORDER — ATENOLOL 25 MG PO TABS
25.0000 mg | ORAL_TABLET | Freq: Every morning | ORAL | Status: DC
  Administered 2013-07-18 – 2013-07-19 (×2): 25 mg via ORAL
  Filled 2013-07-17 (×2): qty 1

## 2013-07-17 MED ORDER — HYDROMORPHONE HCL PF 1 MG/ML IJ SOLN
0.2500 mg | INTRAMUSCULAR | Status: DC | PRN
  Administered 2013-07-17: 0.5 mg via INTRAVENOUS

## 2013-07-17 MED ORDER — FUROSEMIDE 20 MG PO TABS
20.0000 mg | ORAL_TABLET | Freq: Every morning | ORAL | Status: DC
  Administered 2013-07-18 – 2013-07-19 (×2): 20 mg via ORAL
  Filled 2013-07-17 (×2): qty 1

## 2013-07-17 MED ORDER — 0.9 % SODIUM CHLORIDE (POUR BTL) OPTIME
TOPICAL | Status: DC | PRN
  Administered 2013-07-17: 1000 mL

## 2013-07-17 MED ORDER — MENTHOL 3 MG MT LOZG
1.0000 | LOZENGE | OROMUCOSAL | Status: DC | PRN
  Filled 2013-07-17: qty 9

## 2013-07-17 MED ORDER — IRBESARTAN 75 MG PO TABS
75.0000 mg | ORAL_TABLET | Freq: Every day | ORAL | Status: DC
  Administered 2013-07-17 – 2013-07-19 (×3): 75 mg via ORAL
  Filled 2013-07-17 (×3): qty 1

## 2013-07-17 MED ORDER — BISACODYL 10 MG RE SUPP: 10.0000 mg | Freq: Every day | RECTAL | Status: DC | PRN

## 2013-07-17 MED ORDER — FERROUS SULFATE 325 (65 FE) MG PO TABS
325.0000 mg | ORAL_TABLET | Freq: Three times a day (TID) | ORAL | Status: DC
  Administered 2013-07-17 – 2013-07-19 (×5): 325 mg via ORAL
  Filled 2013-07-17 (×8): qty 1

## 2013-07-17 MED ORDER — PHENOL 1.4 % MT LIQD
1.0000 | OROMUCOSAL | Status: DC | PRN
  Filled 2013-07-17: qty 177

## 2013-07-17 MED ORDER — POLYETHYLENE GLYCOL 3350 17 G PO PACK
17.0000 g | PACK | Freq: Two times a day (BID) | ORAL | Status: DC
  Administered 2013-07-17 – 2013-07-19 (×4): 17 g via ORAL

## 2013-07-17 MED ORDER — CEFAZOLIN SODIUM-DEXTROSE 2-3 GM-% IV SOLR
2.0000 g | Freq: Four times a day (QID) | INTRAVENOUS | Status: AC
  Administered 2013-07-17 (×2): 2 g via INTRAVENOUS
  Filled 2013-07-17 (×2): qty 50

## 2013-07-17 MED ORDER — METHOCARBAMOL 100 MG/ML IJ SOLN
500.0000 mg | Freq: Four times a day (QID) | INTRAVENOUS | Status: DC | PRN
  Administered 2013-07-17: 500 mg via INTRAVENOUS
  Filled 2013-07-17: qty 5

## 2013-07-17 MED ORDER — DEXAMETHASONE SODIUM PHOSPHATE 10 MG/ML IJ SOLN
10.0000 mg | Freq: Once | INTRAMUSCULAR | Status: AC
  Administered 2013-07-18: 10 mg via INTRAVENOUS
  Filled 2013-07-17: qty 1

## 2013-07-17 MED ORDER — RIVAROXABAN 20 MG PO TABS
20.0000 mg | ORAL_TABLET | ORAL | Status: DC
  Administered 2013-07-18 – 2013-07-19 (×2): 20 mg via ORAL
  Filled 2013-07-17 (×3): qty 1

## 2013-07-17 MED ORDER — ALUM & MAG HYDROXIDE-SIMETH 200-200-20 MG/5ML PO SUSP: 30.0000 mL | ORAL | Status: DC | PRN

## 2013-07-17 MED ORDER — METHOCARBAMOL 500 MG PO TABS
500.0000 mg | ORAL_TABLET | Freq: Four times a day (QID) | ORAL | Status: DC | PRN
  Administered 2013-07-17 – 2013-07-18 (×2): 500 mg via ORAL
  Filled 2013-07-17 (×3): qty 1

## 2013-07-17 MED ORDER — FENTANYL CITRATE 0.05 MG/ML IJ SOLN
INTRAMUSCULAR | Status: DC | PRN
  Administered 2013-07-17 (×3): 50 ug via INTRAVENOUS
  Administered 2013-07-17: 100 ug via INTRAVENOUS

## 2013-07-17 MED ORDER — LIDOCAINE HCL (CARDIAC) 20 MG/ML IV SOLN
INTRAVENOUS | Status: DC | PRN
  Administered 2013-07-17: 50 mg via INTRAVENOUS

## 2013-07-17 MED ORDER — MUPIROCIN 2 % EX OINT
TOPICAL_OINTMENT | Freq: Two times a day (BID) | CUTANEOUS | Status: DC
  Administered 2013-07-17: 1 via NASAL
  Filled 2013-07-17: qty 22

## 2013-07-17 MED ORDER — HYDROMORPHONE HCL PF 1 MG/ML IJ SOLN
INTRAMUSCULAR | Status: AC
  Filled 2013-07-17: qty 2

## 2013-07-17 MED ORDER — ONDANSETRON HCL 4 MG/2ML IJ SOLN: 4.0000 mg | Freq: Four times a day (QID) | INTRAMUSCULAR | Status: DC | PRN

## 2013-07-17 MED ORDER — ATORVASTATIN CALCIUM 40 MG PO TABS
40.0000 mg | ORAL_TABLET | Freq: Every morning | ORAL | Status: DC
  Administered 2013-07-17 – 2013-07-19 (×3): 40 mg via ORAL
  Filled 2013-07-17 (×3): qty 1

## 2013-07-17 MED ORDER — DILTIAZEM HCL ER COATED BEADS 360 MG PO CP24
360.0000 mg | ORAL_CAPSULE | Freq: Every day | ORAL | Status: DC
  Administered 2013-07-17 – 2013-07-18 (×2): 360 mg via ORAL
  Filled 2013-07-17 (×3): qty 1

## 2013-07-17 MED ORDER — STERILE WATER FOR IRRIGATION IR SOLN
Status: DC | PRN
  Administered 2013-07-17: 3000 mL

## 2013-07-17 MED ORDER — CEFAZOLIN SODIUM-DEXTROSE 2-3 GM-% IV SOLR
2.0000 g | INTRAVENOUS | Status: AC
  Administered 2013-07-17: 2 g via INTRAVENOUS

## 2013-07-17 MED ORDER — PROMETHAZINE HCL 25 MG/ML IJ SOLN: 6.2500 mg | INTRAMUSCULAR | Status: DC | PRN

## 2013-07-17 MED ORDER — FLEET ENEMA 7-19 GM/118ML RE ENEM: 1.0000 | ENEMA | Freq: Once | RECTAL | Status: AC | PRN

## 2013-07-17 MED ORDER — NEOSTIGMINE METHYLSULFATE 1 MG/ML IJ SOLN
INTRAMUSCULAR | Status: DC | PRN
  Administered 2013-07-17: 4 mg via INTRAVENOUS

## 2013-07-17 MED ORDER — LIP MEDEX EX OINT
TOPICAL_OINTMENT | CUTANEOUS | Status: DC | PRN
  Administered 2013-07-17: 22:00:00 via TOPICAL
  Filled 2013-07-17: qty 7

## 2013-07-17 MED ORDER — PROPOFOL 10 MG/ML IV BOLUS
INTRAVENOUS | Status: DC | PRN
  Administered 2013-07-17: 120 mg via INTRAVENOUS

## 2013-07-17 MED ORDER — HYDROMORPHONE HCL PF 1 MG/ML IJ SOLN: 0.5000 mg | INTRAMUSCULAR | Status: DC | PRN

## 2013-07-17 MED ORDER — CELECOXIB 200 MG PO CAPS
200.0000 mg | ORAL_CAPSULE | Freq: Two times a day (BID) | ORAL | Status: DC
  Administered 2013-07-17 – 2013-07-19 (×4): 200 mg via ORAL
  Filled 2013-07-17 (×5): qty 1

## 2013-07-17 MED ORDER — NITROGLYCERIN 0.4 MG SL SUBL: 0.4000 mg | SUBLINGUAL_TABLET | SUBLINGUAL | Status: DC | PRN

## 2013-07-17 MED ORDER — MIDAZOLAM HCL 5 MG/5ML IJ SOLN
INTRAMUSCULAR | Status: DC | PRN
  Administered 2013-07-17 (×2): 1 mg via INTRAVENOUS

## 2013-07-17 MED ORDER — ZOLPIDEM TARTRATE 5 MG PO TABS: 5.0000 mg | ORAL_TABLET | Freq: Every evening | ORAL | Status: DC | PRN

## 2013-07-17 SURGICAL SUPPLY — 40 items
ADH SKN CLS APL DERMABOND .7 (GAUZE/BANDAGES/DRESSINGS) ×1
BAG SPEC THK2 15X12 ZIP CLS (MISCELLANEOUS) ×2
BAG ZIPLOCK 12X15 (MISCELLANEOUS) ×4 IMPLANT
BLADE SAW SGTL 18X1.27X75 (BLADE) ×2 IMPLANT
CAPT HIP PF COP ×1 IMPLANT
CLOTH BEACON ORANGE TIMEOUT ST (SAFETY) ×2 IMPLANT
DERMABOND ADVANCED (GAUZE/BANDAGES/DRESSINGS) ×1
DERMABOND ADVANCED .7 DNX12 (GAUZE/BANDAGES/DRESSINGS) ×1 IMPLANT
DRAPE C-ARM 42X120 X-RAY (DRAPES) ×2 IMPLANT
DRAPE STERI IOBAN 125X83 (DRAPES) ×2 IMPLANT
DRAPE U-SHAPE 47X51 STRL (DRAPES) ×6 IMPLANT
DRSG AQUACEL AG ADV 3.5X10 (GAUZE/BANDAGES/DRESSINGS) ×2 IMPLANT
DRSG TEGADERM 4X4.75 (GAUZE/BANDAGES/DRESSINGS) IMPLANT
DURAPREP 26ML APPLICATOR (WOUND CARE) ×2 IMPLANT
ELECT BLADE TIP CTD 4 INCH (ELECTRODE) ×2 IMPLANT
ELECT REM PT RETURN 9FT ADLT (ELECTROSURGICAL) ×2
ELECTRODE REM PT RTRN 9FT ADLT (ELECTROSURGICAL) ×1 IMPLANT
EVACUATOR 1/8 PVC DRAIN (DRAIN) IMPLANT
FACESHIELD LNG OPTICON STERILE (SAFETY) ×8 IMPLANT
GAUZE SPONGE 2X2 8PLY STRL LF (GAUZE/BANDAGES/DRESSINGS) ×1 IMPLANT
GLOVE BIOGEL PI IND STRL 7.5 (GLOVE) ×1 IMPLANT
GLOVE BIOGEL PI IND STRL 8 (GLOVE) ×1 IMPLANT
GLOVE BIOGEL PI INDICATOR 7.5 (GLOVE) ×1
GLOVE BIOGEL PI INDICATOR 8 (GLOVE) ×1
GLOVE ECLIPSE 8.0 STRL XLNG CF (GLOVE) ×2 IMPLANT
GLOVE ORTHO TXT STRL SZ7.5 (GLOVE) ×4 IMPLANT
GOWN BRE IMP PREV XXLGXLNG (GOWN DISPOSABLE) ×2 IMPLANT
GOWN PREVENTION PLUS LG XLONG (DISPOSABLE) ×2 IMPLANT
KIT BASIN OR (CUSTOM PROCEDURE TRAY) ×2 IMPLANT
PACK TOTAL JOINT (CUSTOM PROCEDURE TRAY) ×2 IMPLANT
PADDING CAST COTTON 6X4 STRL (CAST SUPPLIES) ×2 IMPLANT
SPONGE GAUZE 2X2 STER 10/PKG (GAUZE/BANDAGES/DRESSINGS) ×1
SUCTION FRAZIER 12FR DISP (SUCTIONS) ×2 IMPLANT
SUT MNCRL AB 4-0 PS2 18 (SUTURE) ×2 IMPLANT
SUT VIC AB 1 CT1 36 (SUTURE) ×8 IMPLANT
SUT VIC AB 2-0 CT1 27 (SUTURE) ×4
SUT VIC AB 2-0 CT1 TAPERPNT 27 (SUTURE) ×2 IMPLANT
SUT VLOC 180 0 24IN GS25 (SUTURE) ×2 IMPLANT
TOWEL OR 17X26 10 PK STRL BLUE (TOWEL DISPOSABLE) ×4 IMPLANT
TRAY FOLEY CATH 14FRSI W/METER (CATHETERS) ×2 IMPLANT

## 2013-07-17 NOTE — Transfer of Care (Signed)
Immediate Anesthesia Transfer of Care Note  Patient: [name]  Procedure(s) Performed: Procedure(s): LEFT TOTAL HIP ARTHROPLASTY ANTERIOR APPROACH (Left)  Patient Location: PACU  Anesthesia Type:General  Level of Consciousness: awake, alert , oriented and patient cooperative  Airway & Oxygen Therapy: Patient Spontanous Breathing and Patient connected to face mask oxygen  Post-op Assessment: Report given to PACU RN, Post -op Vital signs reviewed and stable and Patient moving all extremities  Post vital signs: Reviewed and stable  Complications: No apparent anesthesia complications

## 2013-07-17 NOTE — Anesthesia Preprocedure Evaluation (Addendum)
Anesthesia Evaluation  Patient identified by MRN, date of birth, ID band Patient awake    Reviewed: Allergy & Precautions, H&P , NPO status , Patient's Chart, lab work & pertinent test results  Airway Mallampati: II TM Distance: >3 FB Neck ROM: Full    Dental no notable dental hx.    Pulmonary sleep apnea ,  breath sounds clear to auscultation  Pulmonary exam normal       Cardiovascular hypertension, Pt. on medications + dysrhythmias Atrial Fibrillation + Valvular Problems/Murmurs AS Rhythm:Regular Rate:Normal     Neuro/Psych TIAnegative psych ROS   GI/Hepatic negative GI ROS, Neg liver ROS, GERD-  Medicated,  Endo/Other  negative endocrine ROS  Renal/GU negative Renal ROS  negative genitourinary   Musculoskeletal  (+) Fibromyalgia -  Abdominal   Peds negative pediatric ROS (+)  Hematology negative hematology ROS (+)   Anesthesia Other Findings   Reproductive/Obstetrics negative OB ROS                           Anesthesia Physical Anesthesia Plan  ASA: III  Anesthesia Plan: General   Post-op Pain Management:    Induction: Intravenous  Airway Management Planned: Oral ETT  Additional Equipment:   Intra-op Plan:   Post-operative Plan: Extubation in OR  Informed Consent: I have reviewed the patients History and Physical, chart, labs and discussed the procedure including the risks, benefits and alternatives for the proposed anesthesia with the patient or authorized representative who has indicated his/her understanding and acceptance.   Dental advisory given  Plan Discussed with: CRNA and Surgeon  Anesthesia Plan Comments:         Anesthesia Quick Evaluation

## 2013-07-17 NOTE — Interval H&P Note (Signed)
History and Physical Interval Note:  07/17/2013 11:06 AM  [name]  has presented today for surgery, with the diagnosis of LEFT HIP OA  The various methods of treatment have been discussed with the patient and family. After consideration of risks, benefits and other options for treatment, the patient has consented to  Procedure(s): LEFT TOTAL HIP ARTHROPLASTY ANTERIOR APPROACH (Left) as a surgical intervention .  The patient's history has been reviewed, patient examined, no change in status, stable for surgery.  I have reviewed the patient's chart and labs.  Questions were answered to the patient's satisfaction.     Shelda Pal

## 2013-07-17 NOTE — Preoperative (Signed)
Beta Blockers   Reason not to administer Beta Blockers:Not Applicable, took BB this am 

## 2013-07-17 NOTE — Op Note (Signed)
NAME:  [name]                ACCOUNT NO.: [id number]      MEDICAL RECORD NO.: [id number]      FACILITY:  [facility]      PHYSICIAN:  Durene Romans D  DATE OF BIRTH:  [birth year]     DATE OF PROCEDURE:  07/17/2013                                 OPERATIVE REPORT         PREOPERATIVE DIAGNOSIS: Left  hip osteoarthritis.      POSTOPERATIVE DIAGNOSIS:  Left hip osteoarthritis.      PROCEDURE:  Left total hip replacement through an anterior approach   utilizing DePuy THR system, component size 52mm pinnacle cup, a size 36+4 neutral   Altrex liner, a size 4 Hi Tri Lock stem with a 36+5 delta ceramic   ball.      SURGEON:  Madlyn Frankel. Charlann Boxer, M.D.      ASSISTANT:  Leilani Able, PA-C     ANESTHESIA:  Spinal.      SPECIMENS:  None.      COMPLICATIONS:  None.      BLOOD LOSS:  400 cc     DRAINS:  None.      INDICATION OF THE PROCEDURE:  BROOKLYNN BRANDENBURG is a 77 y.o. female who had   presented to office for evaluation of left hip pain.  Radiographs revealed   progressive degenerative changes with bone-on-bone   articulation to the  hip joint.  The patient had painful limited range of   motion significantly affecting their overall quality of life.  The patient was failing to    respond to conservative measures, and at this point was ready   to proceed with more definitive measures.  The patient has noted progressive   degenerative changes in his hip, progressive problems and dysfunction   with regarding the hip prior to surgery.  Consent was obtained for   benefit of pain relief.  Specific risk of infection, DVT, component   failure, dislocation, need for revision surgery, as well discussion of   the anterior versus posterior approach were reviewed.  Consent was   obtained for benefit of anterior pain relief through an anterior   approach.      PROCEDURE IN DETAIL:  The patient was brought to operative theater.   Once adequate anesthesia,  preoperative antibiotics, 2gm Ancef administered.   The patient was positioned supine on the OSI Hanna table.  Once adequate   padding of boney process was carried out, we had predraped out the hip, and  used fluoroscopy to confirm orientation of the pelvis and position.      The left hip was then prepped and draped from proximal iliac crest to   mid thigh with shower curtain technique.      Time-out was performed identifying the patient, planned procedure, and   extremity.     An incision was then made 2 cm distal and lateral to the   anterior superior iliac spine extending over the orientation of the   tensor fascia lata muscle and sharp dissection was carried down to the   fascia of the muscle and protractor placed in the soft tissues.      The fascia was then incised.  The muscle belly was identified and swept  laterally and retractor placed along the superior neck.  Following   cauterization of the circumflex vessels and removing some pericapsular   fat, a second cobra retractor was placed on the inferior neck.  A third   retractor was placed on the anterior acetabulum after elevating the   anterior rectus.  A L-capsulotomy was along the line of the   superior neck to the trochanteric fossa, then extended proximally and   distally.  Tag sutures were placed and the retractors were then placed   intracapsular.  Eventually I performed a capsulectomy as the capsule flaps were interfering with the exposure.  We then identified the trochanteric fossa and   orientation of my neck cut, confirmed this radiographically   and then made a neck osteotomy with the femur on traction.  The femoral   head was removed without difficulty or complication.  Traction was let   off and retractors were placed posterior and anterior around the   acetabulum.      The labrum and foveal tissue were debrided.  I began reaming with a 47mm   reamer and reamed up to 51mm reamer with good bony bed preparation  and a 52   cup was chosen.  The final 52mm Pinnacle cup was then impacted under fluoroscopy  to confirm the depth of penetration and orientation with respect to   abduction.  A screw was placed followed by the hole eliminator.  The final   36+4 neutral Altrex liner was impacted with good visualized rim fit.  The cup was positioned anatomically within the acetabular portion of the pelvis.      At this point, the femur was rolled at 80 degrees.  Further capsule was   released off the inferior aspect of the femoral neck.  I then   released the superior capsule proximally.  The hook was placed laterally   along the femur and elevated manually and held in position with the bed   hook.  The leg was then extended and adducted with the leg rolled to 100   degrees of external rotation.  Once the proximal femur was fully   exposed, I used a box osteotome to set orientation.  I then began   broaching with the starting chili pepper broach and passed this by hand and then broached up to 4.  With the 4 broach in place I chose a high offset neck and did a trial reduction.  The offset was appropriate, leg lengths   appeared to be equal, confirmed radiographically.   Given these findings, I went ahead and dislocated the hip, repositioned all   retractors and positioned the right hip in the extended and abducted position.  The final 4 Hi Tri Lock stem was   chosen and it was impacted down to the level of neck cut.  Based on this   and the trial reduction, a 36+5 delta ceramic ball was chosen based off final trials and   impacted onto a clean and dry trunnion, and the hip was reduced.  The   hip had been irrigated throughout the case again at this point.  The fascia of the   tensor fascia lata muscle was then reapproximated using #1 Vicryl.  The   remaining wound was closed with 2-0 Vicryl and running 4-0 Monocryl.   The hip was cleaned, dried, and dressed sterilely using Dermabond and   Aquacel dressing.  She  was then brought   to recovery room in stable condition tolerating  the procedure well.    Leilani Able, PA-C was present for the entirety of the case involved from   preoperative positioning, perioperative retractor management, general   facilitation of the case, as well as primary wound closure as assistant.            Madlyn Frankel Charlann Boxer, M.D.            MDO/MEDQ  D:  08/10/2011  T:  08/10/2011  Job:  [id number]      Electronically Signed by Durene Romans M.D. on 08/16/2011 09:15:38 AM

## 2013-07-17 NOTE — Plan of Care (Signed)
Problem: Consults Goal: Diagnosis- Total Joint Replacement Left anterior hip     

## 2013-07-17 NOTE — Anesthesia Postprocedure Evaluation (Signed)
  Anesthesia Post-op Note  Patient: [name]  Procedure(s) Performed: Procedure(s) (LRB): LEFT TOTAL HIP ARTHROPLASTY ANTERIOR APPROACH (Left)  Patient Location: PACU  Anesthesia Type: General  Level of Consciousness: awake and alert   Airway and Oxygen Therapy: Patient Spontanous Breathing  Post-op Pain: mild  Post-op Assessment: Post-op Vital signs reviewed, Patient's Cardiovascular Status Stable, Respiratory Function Stable, Patent Airway and No signs of Nausea or vomiting  Last Vitals:  Filed Vitals:   07/17/13 1501  BP: 181/73  Pulse: 72  Temp:   Resp:     Post-op Vital Signs: stable   Complications: No apparent anesthesia complications

## 2013-07-17 NOTE — Progress Notes (Signed)
Patient did not want to wear the CPAP. She says she can't tolerate anything on her face.RN aware!

## 2013-07-17 NOTE — Progress Notes (Signed)
Utilization review completed.  

## 2013-07-18 ENCOUNTER — Encounter: Payer: PRIVATE HEALTH INSURANCE | Admitting: Cardiology

## 2013-07-18 ENCOUNTER — Encounter (HOSPITAL_COMMUNITY): Payer: Self-pay | Admitting: Orthopedic Surgery

## 2013-07-18 DIAGNOSIS — D500 Iron deficiency anemia secondary to blood loss (chronic): Secondary | ICD-10-CM | POA: Diagnosis not present

## 2013-07-18 LAB — CBC
HCT: 33.9 % — ABNORMAL LOW (ref 36.0–46.0)
MCH: 32 pg (ref 26.0–34.0)
MCHC: 33.3 g/dL (ref 30.0–36.0)
Platelets: 171 10*3/uL (ref 150–400)
RBC: 3.53 MIL/uL — ABNORMAL LOW (ref 3.87–5.11)
RDW: 13 % (ref 11.5–15.5)

## 2013-07-18 LAB — BASIC METABOLIC PANEL
CO2: 24 mEq/L (ref 19–32)
Calcium: 9.4 mg/dL (ref 8.4–10.5)
Creatinine, Ser: 0.76 mg/dL (ref 0.50–1.10)
GFR calc Af Amer: >90 mL/min (ref >90)
GFR calc non Af Amer: 80 mL/min — ABNORMAL LOW (ref >90)
Potassium: 4.1 mEq/L (ref 3.5–5.1)
Sodium: 136 mEq/L (ref 135–145)

## 2013-07-18 NOTE — Progress Notes (Addendum)
Physical Therapy Treatment Patient Details Name: [name] MRN: [id number] DOB: [birth year] Today's Date: 07/18/2013 Time: 4098-1191 PT Time Calculation (min): 10 min  PT Assessment / Plan / Recommendation  History of Present Illness 77 yo female s/p L THA-direct anterior 9/30.    PT Comments   Pt declined ambulation this session-reported she walked earlier with nursing/ OT???. Agreeable to bed exercises. Plan is for d/c home on tomorrow.   Follow Up Recommendations  Home health PT     Does the patient have the potential to tolerate intense rehabilitation     Barriers to Discharge        Equipment Recommendations  None recommended by PT    Recommendations for Other Services OT consult  Frequency 7X/week   Progress towards PT Goals Progress towards PT goals: Progressing toward goals  Plan Current plan remains appropriate    Precautions / Restrictions Precautions Precautions: Fall Restrictions Weight Bearing Restrictions: No LLE Weight Bearing: Weight bearing as tolerated   Pertinent Vitals/Pain 5/10 L LE with activity. Ice applied end of session.     Mobility      Exercises Total Joint Exercises Ankle Circles/Pumps: AROM;Both;15 reps;Supine Quad Sets: AROM;Both;15 reps;Supine Short Arc Quad: AROM;Left;15 reps;Supine Heel Slides: AAROM;Left;15 reps;Supine Hip ABduction/ADduction: AAROM;Left;15 reps;Supine   PT Diagnosis: Difficulty walking;Abnormality of gait;Acute pain  PT Problem List: Decreased strength;Decreased range of motion;Decreased activity tolerance;Decreased mobility;Pain;Decreased knowledge of use of DME PT Treatment Interventions: DME instruction;Gait training;Stair training;Functional mobility training;Therapeutic activities;Therapeutic exercise;Patient/family education   PT Goals (current goals can now be found in the care plan section) Acute Rehab PT Goals Patient Stated Goal: home. regain independence PT Goal Formulation: With patient Time  For Goal Achievement: 07/25/13 Potential to Achieve Goals: Good  Visit Information  Last PT Received On: 07/18/13 Assistance Needed: +1 History of Present Illness: 77 yo female s/p L THA-direct anterior 9/30.     Subjective Data  Patient Stated Goal: home. regain independence   Cognition  Cognition Arousal/Alertness: Awake/alert Behavior During Therapy: WFL for tasks assessed/performed Overall Cognitive Status: Within Functional Limits for tasks assessed    Balance     End of Session PT - End of Session Equipment Utilized During Treatment: Gait belt Activity Tolerance: Patient tolerated treatment well Patient left: in bed;with call bell/phone within reach;with family/visitor present   GP     Rebeca Alert, MPT Pager: [phone]

## 2013-07-18 NOTE — Progress Notes (Signed)
Spoke with patient regarding cpap use for bedtime.  Pt stated she will be getting a new cpap for home use later this week and wants to hold off wearing it here in the hospital.  Pt was advised that RT is available all night should she change her mind.  RN aware.

## 2013-07-18 NOTE — Progress Notes (Signed)
   Subjective: 1 Day Post-Op Procedure(s) (LRB): LEFT TOTAL HIP ARTHROPLASTY ANTERIOR APPROACH (Left)   Patient reports pain as mild, pain controlled. No events throughout the night.   Objective:   VITALS:   Filed Vitals:   07/18/13 0639  BP: 109/53  Pulse: 70  Temp: 98 F (36.7 C)  Resp: 16    Neurovascular intact Dorsiflexion/Plantar flexion intact Incision: dressing C/D/I No cellulitis present Compartment soft  LABS  Recent Labs  07/18/13 0435  HGB 11.3*  HCT 33.9*  WBC 8.3  PLT 171     Recent Labs  07/18/13 0435  NA 136  K 4.1  BUN 19  CREATININE 0.76  GLUCOSE 156*     Assessment/Plan: 1 Day Post-Op Procedure(s) (LRB): LEFT TOTAL HIP ARTHROPLASTY ANTERIOR APPROACH (Left) Foley cath d/c'ed Advance diet Up with therapy D/C IV fluids Discharge home with home health eventually, when ready  Expected ABLA  Treated with iron and will observe  Obese (BMI 30-39.9) Estimated body mass index is 31.79 kg/(m^2) as calculated from the following:   Height as of this encounter: 5\' 7"  (1.702 m).   Weight as of this encounter: 92.08 kg (203 lb). Patient also counseled that weight may inhibit the healing process Patient counseled that losing weight will help with future health issues       Anastasio Auerbach. Roger Fasnacht   PAC  07/18/2013, 8:59 AM

## 2013-07-18 NOTE — Evaluation (Signed)
Occupational Therapy Evaluation Patient Details Name: [name] MRN: [id number] DOB: [birth year] Today's Date: 07/18/2013 Time: 1202-1218 OT Time Calculation (min): 16 min  OT Assessment / Plan / Recommendation History of present illness 77 yo female s/p L THA-direct anterior 9/30.    Clinical Impression   Pt presents to OT with decreased I with ADL activity s/p THA. Pts husband will A as needed. Education complete regarding ADL activity s/p THA       Follow Up Recommendations  No OT follow up       Equipment Recommendations  None recommended by OT          Precautions / Restrictions Precautions Precautions: Fall Restrictions Weight Bearing Restrictions: No LLE Weight Bearing: Weight bearing as tolerated       ADL  Grooming: Set up Where Assessed - Grooming: Unsupported sitting Lower Body Bathing: Set up Where Assessed - Lower Body Bathing: Unsupported sitting Upper Body Dressing: Minimal assistance Where Assessed - Upper Body Dressing: Unsupported sit to stand Lower Body Dressing: Minimal assistance Where Assessed - Lower Body Dressing: Unsupported sit to stand Toilet Transfer: Min guard Toilet Transfer Method: Sit to Barista: Comfort height toilet Toileting - Architect and Hygiene: Min guard Where Assessed - Engineer, mining and Hygiene: Standing ADL Comments: Educated pt on AE. Pt will obtain reach for dressing as husband has been helping her with this      OT Goals(Current goals can be found in the care plan section) Acute Rehab OT Goals Patient Stated Goal: home. regain independence Potential to Achieve Goals: Good  Visit Information  Last OT Received On: 07/18/13 Assistance Needed: +1 History of Present Illness: 77 yo female s/p L THA-direct anterior 9/30.        Prior Functioning     Home Living Family/patient expects to be discharged to:: Private residence Living Arrangements:  Spouse/significant other Type of Home: House Home Access: Stairs to enter [employer] of Steps: 3 Entrance Stairs-Rails: Left;Right Home Layout: Two level;Able to live on main level with bedroom/bathroom Home Equipment: Dan Humphreys - 2 wheels;Crutches;Bedside commode Prior Function Level of Independence: Independent Communication Communication: No difficulties         Vision/Perception Vision - History Patient Visual Report: No change from baseline   Cognition  Cognition Arousal/Alertness: Awake/alert Behavior During Therapy: WFL for tasks assessed/performed Overall Cognitive Status: Within Functional Limits for tasks assessed    Extremity/Trunk Assessment Upper Extremity Assessment Upper Extremity Assessment: Overall WFL for tasks assessed Lower Extremity Assessment Lower Extremity Assessment: LLE deficits/detail LLE Deficits / Details: hip flex 2/5, hip abd/add 2/5, moves ankle well Cervical / Trunk Assessment Cervical / Trunk Assessment: Normal     Mobility Bed Mobility Bed Mobility: Supine to Sit Supine to Sit: 4: Min guard;HOB elevated Details for Bed Mobility Assistance: Increased time.  Transfers Sit to Stand: 4: Min assist;From bed;From chair/3-in-1 Stand to Sit: To chair/3-in-1;4: Min guard Details for Transfer Assistance: x 2. VCs safety, technique, hand placement. Assist to rise, stabilize.            End of Session OT - End of Session Equipment Utilized During Treatment: Rolling walker Activity Tolerance: Patient tolerated treatment well Patient left: in chair;with family/visitor present  GO     Alba Cory 07/18/2013, 12:18 PM

## 2013-07-18 NOTE — Evaluation (Signed)
Physical Therapy Evaluation Patient Details Name: [name] MRN: [id number] DOB: [birth year] Today's Date: 07/18/2013 Time: 7829-5621 PT Time Calculation (min): 14 min  PT Assessment / Plan / Recommendation History of Present Illness  77 yo female s/p L THA-direct anterior 9/30.   Clinical Impression  On eval, pt required Min assist for mobility-able to ambulate ~100 feet with RW. Anticipate pt will progress well during stay. Recommend HHPT.    PT Assessment  Patient needs continued PT services    Follow Up Recommendations  Home health PT    Does the patient have the potential to tolerate intense rehabilitation      Barriers to Discharge        Equipment Recommendations  None recommended by PT    Recommendations for Other Services OT consult   Frequency 7X/week    Precautions / Restrictions Precautions Precautions: Fall Restrictions Weight Bearing Restrictions: No LLE Weight Bearing: Weight bearing as tolerated   Pertinent Vitals/Pain 5/10 L hip with activity. Ice applied end of session      Mobility  Bed Mobility Bed Mobility: Supine to Sit Supine to Sit: 4: Min guard;HOB elevated Details for Bed Mobility Assistance: Increased time.  Transfers Transfers: Sit to Stand;Stand to Sit Sit to Stand: 4: Min assist;From bed;From chair/3-in-1 Stand to Sit: To chair/3-in-1;4: Min guard Details for Transfer Assistance: x 2. VCs safety, technique, hand placement. Assist to rise, stabilize.  Ambulation/Gait Ambulation/Gait Assistance: 4: Min assist Ambulation Distance (Feet): 100 Feet Assistive device: Rolling walker Ambulation/Gait Assistance Details: VCs safety, technique, sequence. Slow gait speed.  Gait Pattern: Antalgic;Decreased step length - left;Decreased stride length    Exercises     PT Diagnosis: Difficulty walking;Abnormality of gait;Acute pain  PT Problem List: Decreased strength;Decreased range of motion;Decreased activity tolerance;Decreased  mobility;Pain;Decreased knowledge of use of DME PT Treatment Interventions: DME instruction;Gait training;Stair training;Functional mobility training;Therapeutic activities;Therapeutic exercise;Patient/family education     PT Goals(Current goals can be found in the care plan section) Acute Rehab PT Goals Patient Stated Goal: home. regain independence PT Goal Formulation: With patient Time For Goal Achievement: 07/25/13 Potential to Achieve Goals: Good  Visit Information  Last PT Received On: 07/18/13 Assistance Needed: +1 History of Present Illness: 77 yo female s/p L THA-direct anterior 9/30.        Prior Functioning  Home Living Family/patient expects to be discharged to:: Private residence Living Arrangements: Spouse/significant other Type of Home: House Home Access: Stairs to enter [employer] of Steps: 3 Entrance Stairs-Rails: Left;Right Home Layout: Two level;Able to live on main level with bedroom/bathroom Home Equipment: Dan Humphreys - 2 wheels;Crutches;Bedside commode Prior Function Level of Independence: Independent Communication Communication: No difficulties    Cognition  Cognition Arousal/Alertness: Awake/alert Behavior During Therapy: WFL for tasks assessed/performed Overall Cognitive Status: Within Functional Limits for tasks assessed    Extremity/Trunk Assessment Upper Extremity Assessment Upper Extremity Assessment: Overall WFL for tasks assessed Lower Extremity Assessment Lower Extremity Assessment: LLE deficits/detail LLE Deficits / Details: hip flex 2/5, hip abd/add 2/5, moves ankle well Cervical / Trunk Assessment Cervical / Trunk Assessment: Normal   Balance    End of Session PT - End of Session Equipment Utilized During Treatment: Gait belt Activity Tolerance: Patient tolerated treatment well Patient left: with call bell/phone within reach  GP     Rebeca Alert, MPT Pager: [phone]

## 2013-07-18 NOTE — Progress Notes (Signed)
[facility]  [facility] is providing the following services: Patient declined RW and Commode - already has both at home.   If patient discharges after hours, please call [phone].   Renard Hamper 07/18/2013, 8:40 AM

## 2013-07-18 NOTE — Plan of Care (Signed)
Problem: Consults Goal: Diagnosis- Total Joint Replacement Outcome: Completed/Met Date Met:  07/18/13 Primary Total Hip Left, Anterior

## 2013-07-19 LAB — CBC
HCT: 34.9 % — ABNORMAL LOW (ref 36.0–46.0)
MCHC: 33 g/dL (ref 30.0–36.0)
RBC: 3.62 MIL/uL — ABNORMAL LOW (ref 3.87–5.11)
RDW: 13.1 % (ref 11.5–15.5)
WBC: 11.5 10*3/uL — ABNORMAL HIGH (ref 4.0–10.5)

## 2013-07-19 LAB — BASIC METABOLIC PANEL
BUN: 31 mg/dL — ABNORMAL HIGH (ref 6–23)
GFR calc Af Amer: 57 mL/min — ABNORMAL LOW (ref >90)
GFR calc non Af Amer: 49 mL/min — ABNORMAL LOW (ref >90)
Glucose, Bld: 141 mg/dL — ABNORMAL HIGH (ref 70–99)
Potassium: 4.7 mEq/L (ref 3.5–5.1)
Sodium: 138 mEq/L (ref 135–145)

## 2013-07-19 MED ORDER — HYDROMORPHONE HCL 2 MG PO TABS: 2.0000 mg | ORAL_TABLET | ORAL | Status: DC

## 2013-07-19 MED ORDER — FERROUS SULFATE 325 (65 FE) MG PO TABS: 325.0000 mg | ORAL_TABLET | Freq: Three times a day (TID) | ORAL | Status: DC

## 2013-07-19 MED ORDER — TIZANIDINE HCL 4 MG PO TABS: 4.0000 mg | ORAL_TABLET | Freq: Four times a day (QID) | ORAL | Status: DC | PRN

## 2013-07-19 MED ORDER — DSS 100 MG PO CAPS: 100.0000 mg | ORAL_CAPSULE | Freq: Two times a day (BID) | ORAL | Status: DC

## 2013-07-19 MED ORDER — POLYETHYLENE GLYCOL 3350 17 G PO PACK: 17.0000 g | PACK | Freq: Two times a day (BID) | ORAL | Status: DC

## 2013-07-19 NOTE — Discharge Summary (Signed)
Physician Discharge Summary  Patient ID: [name] MRN: [id number] DOB/AGE: 07/07/1936 77 y.o.  Admit date: 07/17/2013 Discharge date: 07/19/2013   Procedures:  Procedure(s) (LRB): LEFT TOTAL HIP ARTHROPLASTY ANTERIOR APPROACH (Left)  Attending Physician:  Dr. Durene Romans   Admission Diagnoses:   Left hip OA / pain  Discharge Diagnoses:  Principal Problem:   S/P left THA, AA Active Problems:   Expected blood loss anemia   Obese  Past Medical History  Diagnosis Date  . Carpal tunnel syndrome     Migrated  . Hypertension   . Hypercholesteremia   . Infected postoperative breast seroma 2006    s/p drainage  . Breast implant removal status 2006  . S/P carpal tunnel release 2005  . Retroperitoneal hematoma 03/2011  . Arthritis   . Cancer     lt breast  . GERD (gastroesophageal reflux disease)   . Fibromyalgia   . Complication of anesthesia     extremly claustophobic  . Aortic stenosis     mild to moderate AS by 07/2012 echo  . Heart murmur     mild to mod AS, mild MR, mod TR/PR by 07/2012 echo  . Hypercholesterolemia   . Menopause   . Breast cancer     ,Multifocal  . Lower back pain     Migrated  . A-fib     controlled  . TIA (transient ischemic attack)   . Acquired stenosis of aortic valve   . Shortness of breath     with exertion   . Sleep apnea     cpap ordered on 07/06/13   . Urinary incontinence     HPI: [name], 77 y.o. female, has a history of pain and functional disability in the left hip(s) due to arthritis and patient has failed non-surgical conservative treatments for greater than 12 weeks to include NSAID's and/or analgesics, corticosteriod injections and activity modification. Onset of symptoms was gradual starting years ago with rapidlly worsening over the last 6 months.The patient noted no past surgery on the left hip(s). Patient currently rates pain in the left hip at 10 out of 10 with activity. Patient has night pain,  worsening of pain with activity and weight bearing, trendelenberg gait, pain that interfers with activities of daily living and pain with passive range of motion. Patient has evidence of periarticular osteophytes and joint space narrowing by imaging studies. This condition presents safety issues increasing the risk of falls. There is no current active infection. Risks, benefits and expectations were discussed with the patient. Patient understand the risks, benefits and expectations and wishes to proceed with surgery.   PCP: Elie Confer, MD   Discharged Condition: good  Hospital Course:  Patient underwent the above stated procedure on 07/17/2013. Patient tolerated the procedure well and brought to the recovery room in good condition and subsequently to the floor.  POD #1 BP: 109/53 ; Pulse: 70 ; Temp: 98 F (36.7 C) ; Resp: 16 Pt's foley was removed, as well as the hemovac drain removed. IV was changed to a saline lock. Patient reports pain as mild, pain controlled. No events throughout the night.  Neurovascular intact, dorsiflexion/plantar flexion intact, incision: dressing C/D/I, no cellulitis present and compartment soft.   LABS  Basename    HGB  11.3  HCT  33.9   POD #2  BP: 149/69 ; Pulse: 94 ; Temp: 97.5 F (36.4 C) ; Resp: 16 Patient reports pain as mild, pain well controlled. No events throughout the  night. Ready to be discharged home. Neurovascular intact, dorsiflexion/plantar flexion intact, incision: dressing C/D/I, no cellulitis present and compartment soft.   LABS  Basename    HGB  11.5  HCT  34.9    Discharge Exam: General appearance: alert, cooperative and no distress Extremities: Homans sign is negative, no sign of DVT, no edema, redness or tenderness in the calves or thighs and no ulcers, gangrene or trophic changes  Disposition:   Home or Self Care with follow up in 2 weeks   Follow-up Information   Follow up with Shelda Pal, MD. Schedule an  appointment as soon as possible for a visit in 2 weeks.   Specialty:  Orthopedic Surgery   Contact information:   [address] [place] [address] [phone]       Discharge Orders   Future Appointments Provider Department Dept Phone   07/23/2013 12:20 PM [facility] [place] Office [phone]   08/20/2013 11:15 AM Quintella Reichert, MD [facility] [phone]   Future Orders Complete By Expires   Call MD / Call 911  As directed    Comments:     If you experience chest pain or shortness of breath, CALL 911 and be transported to the hospital emergency room.  If you develope a fever above 101 F, pus (white drainage) or increased drainage or redness at the wound, or calf pain, call your surgeon's office.   Change dressing  As directed    Comments:     Maintain surgical dressing for 10-14 days, then replace with 4x4 guaze and tape. Keep the area dry and clean.   Constipation Prevention  As directed    Comments:     Drink plenty of fluids.  Prune juice may be helpful.  You may use a stool softener, such as Colace (over the counter) 100 mg twice a day.  Use MiraLax (over the counter) for constipation as needed.   Diet - low sodium heart healthy  As directed    Discharge instructions  As directed    Comments:     Maintain surgical dressing for 10-14 days, then replace with gauze and tape. Keep the area dry and clean until follow up. Follow up in 2 weeks at [facility] Call with any questions or concerns.   Increase activity slowly as tolerated  As directed    TED hose  As directed    Comments:     Use stockings (TED hose) for 2 weeks on both leg(s).  You may remove them at night for sleeping.   Weight bearing as tolerated  As directed         Medication List    STOP taking these medications       traMADol 50 MG tablet  Commonly known as:  ULTRAM      TAKE these medications       acetaminophen 325 MG tablet    Commonly known as:  TYLENOL  Take 650 mg by mouth every 6 (six) hours as needed for pain.     atenolol 25 MG tablet  Commonly known as:  TENORMIN  Take 25 mg by mouth every morning.     atorvastatin 40 MG tablet  Commonly known as:  LIPITOR  Take 40 mg by mouth every morning.     CALCIUM 600 + D PO  Take 1 tablet by mouth daily.     diltiazem 360 MG 24 hr capsule  Commonly known as:  CARDIZEM  CD  Take 360 mg by mouth daily with supper.     DSS 100 MG Caps  Take 100 mg by mouth 2 (two) times daily.     ferrous sulfate 325 (65 FE) MG tablet  Take 1 tablet (325 mg total) by mouth 3 (three) times daily after meals.     furosemide 20 MG tablet  Commonly known as:  LASIX  Take 20 mg by mouth every morning.     Glucosamine 500 MG Tabs  Take 500 mg by mouth 2 (two) times daily.     HYDROmorphone 2 MG tablet  Commonly known as:  DILAUDID  Take 1-2 tablets (2-4 mg total) by mouth every 4 (four) hours.     nitroGLYCERIN 0.4 MG SL tablet  Commonly known as:  NITROSTAT  Place 0.4 mg under the tongue every 5 (five) minutes as needed. For chest pain     olmesartan 20 MG tablet  Commonly known as:  BENICAR  Take 10 mg by mouth every morning.     omeprazole 20 MG capsule  Commonly known as:  PRILOSEC  Take 20 mg by mouth daily.     polyethylene glycol packet  Commonly known as:  MIRALAX / GLYCOLAX  Take 17 g by mouth 2 (two) times daily.     tiZANidine 4 MG tablet  Commonly known as:  ZANAFLEX  Take 1 tablet (4 mg total) by mouth every 6 (six) hours as needed (muscle spasms).     XARELTO 20 MG Tabs tablet  Generic drug:  Rivaroxaban  Take 20 mg by mouth daily with supper.         Signed: Anastasio Auerbach. Tiawanna Luchsinger   PAC  07/19/2013, 5:49 PM

## 2013-07-19 NOTE — Progress Notes (Signed)
   Subjective: 2 Days Post-Op Procedure(s) (LRB): LEFT TOTAL HIP ARTHROPLASTY ANTERIOR APPROACH (Left)   Patient reports pain as mild, pain well controlled. No events throughout the night. Ready to be discharged home.  Objective:   VITALS:   Filed Vitals:   07/19/13 0546  BP: 149/69  Pulse: 94  Temp: 97.5 F (36.4 C)  Resp: 16    Neurovascular intact Dorsiflexion/Plantar flexion intact Incision: dressing C/D/I No cellulitis present Compartment soft  LABS  Recent Labs  07/18/13 0435 07/19/13 0415  HGB 11.3* 11.5*  HCT 33.9* 34.9*  WBC 8.3 11.5*  PLT 171 193     Recent Labs  07/18/13 0435 07/19/13 0415  NA 136 138  K 4.1 4.7  BUN 19 31*  CREATININE 0.76 1.07  GLUCOSE 156* 141*     Assessment/Plan: 2 Days Post-Op Procedure(s) (LRB): LEFT TOTAL HIP ARTHROPLASTY ANTERIOR APPROACH (Left) Up with therapy Discharge home with home health Follow up in 2 weeks at [facility]. Follow up with OLIN,Nerine Pulse D in 2 weeks.  Contact information:  [facility] [address], [address] [place] [address] [phone]    Expected ABLA  Treated with iron and will observe   Obese (BMI 30-39.9)  Estimated body mass index is 31.79 kg/(m^2) as calculated from the following:      Height as of this encounter: 5\' 7"  (1.702 m).      Weight as of this encounter: 92.08 kg (203 lb).  Patient also counseled that weight may inhibit the healing process  Patient counseled that losing weight will help with future health issues      Anastasio Auerbach. Makaia Rappa   PAC  07/19/2013, 7:54 AM

## 2013-07-19 NOTE — Progress Notes (Signed)
Physical Therapy Treatment Patient Details Name: [name] MRN: [id number] DOB: Dec 31, 1935 Today's Date: 07/19/2013 Time: 0347-4259 PT Time Calculation (min): 24 min  PT Assessment / Plan / Recommendation  History of Present Illness 77 yo female s/p L THA-direct anterior 9/30.    PT Comments   POD # 2 pt plans to D/C to home today.  Amb in hallway, practiced steps then performed THR TE's following handout.  Instructed on use of ICE.   Follow Up Recommendations  Home health PT     Does the patient have the potential to tolerate intense rehabilitation     Barriers to Discharge        Equipment Recommendations  None recommended by PT    Recommendations for Other Services    Frequency     Progress towards PT Goals Progress towards PT goals: Progressing toward goals  Plan Current plan remains appropriate    Precautions / Restrictions Precautions Precautions: Fall Restrictions Weight Bearing Restrictions: No LLE Weight Bearing: Weight bearing as tolerated    Pertinent Vitals/Pain C/o "soreness"    Mobility  Bed Mobility Bed Mobility: Not assessed Details for Bed Mobility Assistance: Pt OOB in recliner Transfers Transfers: Sit to Stand;Stand to Sit Sit to Stand: 6: Modified independent (Device/Increase time);From chair/3-in-1 Stand to Sit: 6: Modified independent (Device/Increase time);To chair/3-in-1 Details for Transfer Assistance: good use of hands and safety Ambulation/Gait Ambulation/Gait Assistance: 6: Modified independent (Device/Increase time) Ambulation Distance (Feet): 145 Feet Assistive device: Rolling walker Ambulation/Gait Assistance Details: good alternating agit and speed Gait Pattern: Within Functional Limits Stairs: Yes Stairs Assistance: 5: Supervision Stairs Assistance Details (indicate cue type and reason): one initial VC on safety with foot placement completion on each step Stair Management Technique: Two rails;Forwards Number of Stairs:  2    Exercises   Total Hip Replacement TE's 10 reps ankle pumps 10 reps knee presses 10 reps heel slides 10 reps SAQ's 10 reps ABD Followed by ICE    PT Goals (current goals can now be found in the care plan section)    Visit Information  Last PT Received On: 07/19/13 Assistance Needed: +1 History of Present Illness: 77 yo female s/p L THA-direct anterior 9/30.     Subjective Data      Cognition       Balance     End of Session PT - End of Session Equipment Utilized During Treatment: Gait belt Activity Tolerance: Patient tolerated treatment well Patient left: in chair;with call bell/phone within reach   Felecia Shelling  PTA [facility]  Acute  Rehab Pager      [phone]

## 2013-07-20 DIAGNOSIS — Z96649 Presence of unspecified artificial hip joint: Secondary | ICD-10-CM | POA: Diagnosis not present

## 2013-07-20 DIAGNOSIS — IMO0001 Reserved for inherently not codable concepts without codable children: Secondary | ICD-10-CM | POA: Diagnosis not present

## 2013-07-20 DIAGNOSIS — Z4801 Encounter for change or removal of surgical wound dressing: Secondary | ICD-10-CM | POA: Diagnosis not present

## 2013-07-20 DIAGNOSIS — D62 Acute posthemorrhagic anemia: Secondary | ICD-10-CM | POA: Diagnosis not present

## 2013-07-20 DIAGNOSIS — Z471 Aftercare following joint replacement surgery: Secondary | ICD-10-CM | POA: Diagnosis not present

## 2013-07-20 NOTE — Care Management Note (Signed)
    Page 1 of 1   07/20/2013     2:42:22 PM   CARE MANAGEMENT NOTE 07/20/2013  Patient:  [name], [name]   Account Number:  [id number]  Date Initiated:  07/19/2013  Documentation initiated by:  Colleen Can  Subjective/Objective Assessment:   dx left hip replacemnt-anterior approach     Action/Plan:   CM spoke with patient. Plans are for patient to return to her home where spouse will be caregiver. She already has DME. Genevieve Norlander will provide [facility] services with start date 07/20/2013.   Anticipated DC Date:  07/19/2013   Anticipated DC Plan:  HOME W HOME HEALTH SERVICES      DC Planning Services  CM consult      [facility] Choice  HOME HEALTH   Choice offered to / List presented to:  C-1 Patient        HH arranged  HH-2 PT      [facility] agency  [facility]   Status of service:  Completed, signed off Medicare Important Message given?  NA - LOS <3 / Initial given by admissions (If response is "NO", the following Medicare IM given date fields will be blank) Date Medicare IM given:   Date Additional Medicare IM given:    Discharge Disposition:  HOME W HOME HEALTH SERVICES  Per UR Regulation:    If discussed at Long Length of Stay Meetings, dates discussed:    Comments:

## 2013-07-21 DIAGNOSIS — Z96649 Presence of unspecified artificial hip joint: Secondary | ICD-10-CM | POA: Diagnosis not present

## 2013-07-21 DIAGNOSIS — Z471 Aftercare following joint replacement surgery: Secondary | ICD-10-CM | POA: Diagnosis not present

## 2013-07-21 DIAGNOSIS — D62 Acute posthemorrhagic anemia: Secondary | ICD-10-CM | POA: Diagnosis not present

## 2013-07-21 DIAGNOSIS — Z4801 Encounter for change or removal of surgical wound dressing: Secondary | ICD-10-CM | POA: Diagnosis not present

## 2013-07-21 DIAGNOSIS — IMO0001 Reserved for inherently not codable concepts without codable children: Secondary | ICD-10-CM | POA: Diagnosis not present

## 2013-07-23 ENCOUNTER — Telehealth: Payer: Self-pay | Admitting: General Surgery

## 2013-07-23 ENCOUNTER — Other Ambulatory Visit (INDEPENDENT_AMBULATORY_CARE_PROVIDER_SITE_OTHER): Payer: Medicare Other

## 2013-07-23 DIAGNOSIS — Z96649 Presence of unspecified artificial hip joint: Secondary | ICD-10-CM | POA: Diagnosis not present

## 2013-07-23 DIAGNOSIS — D62 Acute posthemorrhagic anemia: Secondary | ICD-10-CM | POA: Diagnosis not present

## 2013-07-23 DIAGNOSIS — I4891 Unspecified atrial fibrillation: Secondary | ICD-10-CM | POA: Diagnosis not present

## 2013-07-23 DIAGNOSIS — Z471 Aftercare following joint replacement surgery: Secondary | ICD-10-CM | POA: Diagnosis not present

## 2013-07-23 DIAGNOSIS — IMO0001 Reserved for inherently not codable concepts without codable children: Secondary | ICD-10-CM | POA: Diagnosis not present

## 2013-07-23 DIAGNOSIS — Z79899 Other long term (current) drug therapy: Secondary | ICD-10-CM | POA: Diagnosis not present

## 2013-07-23 DIAGNOSIS — Z4801 Encounter for change or removal of surgical wound dressing: Secondary | ICD-10-CM | POA: Diagnosis not present

## 2013-07-23 LAB — CBC
HCT: 35.4 % — ABNORMAL LOW (ref 36.0–46.0)
Hemoglobin: 11.9 g/dL — ABNORMAL LOW (ref 12.0–15.0)
MCHC: 33.7 g/dL (ref 30.0–36.0)
MCV: 97 fl (ref 78.0–100.0)
RBC: 3.66 Mil/uL — ABNORMAL LOW (ref 3.87–5.11)
RDW: 14 % (ref 11.5–14.6)

## 2013-07-23 NOTE — Telephone Encounter (Signed)
Message copied by Nita Sells on Mon Jul 23, 2013  4:07 PM ------      Message from: Quintella Reichert      Created: Mon Jul 23, 2013 12:49 PM       Creatinine stable. CBC OK.  Please forward to primary MD ------

## 2013-07-23 NOTE — Telephone Encounter (Signed)
Pt is aware. Routed results to PCP as requested.

## 2013-07-25 DIAGNOSIS — Z23 Encounter for immunization: Secondary | ICD-10-CM | POA: Diagnosis not present

## 2013-07-25 DIAGNOSIS — E7800 Pure hypercholesterolemia, unspecified: Secondary | ICD-10-CM | POA: Diagnosis not present

## 2013-07-25 DIAGNOSIS — Z0000 Encounter for general adult medical examination without abnormal findings: Secondary | ICD-10-CM | POA: Diagnosis not present

## 2013-07-25 DIAGNOSIS — I10 Essential (primary) hypertension: Secondary | ICD-10-CM | POA: Diagnosis not present

## 2013-07-26 DIAGNOSIS — IMO0001 Reserved for inherently not codable concepts without codable children: Secondary | ICD-10-CM | POA: Diagnosis not present

## 2013-07-26 DIAGNOSIS — D62 Acute posthemorrhagic anemia: Secondary | ICD-10-CM | POA: Diagnosis not present

## 2013-07-26 DIAGNOSIS — Z471 Aftercare following joint replacement surgery: Secondary | ICD-10-CM | POA: Diagnosis not present

## 2013-07-26 DIAGNOSIS — Z4801 Encounter for change or removal of surgical wound dressing: Secondary | ICD-10-CM | POA: Diagnosis not present

## 2013-07-26 DIAGNOSIS — Z96649 Presence of unspecified artificial hip joint: Secondary | ICD-10-CM | POA: Diagnosis not present

## 2013-07-27 DIAGNOSIS — R350 Frequency of micturition: Secondary | ICD-10-CM | POA: Diagnosis not present

## 2013-07-27 DIAGNOSIS — N3946 Mixed incontinence: Secondary | ICD-10-CM | POA: Diagnosis not present

## 2013-07-31 DIAGNOSIS — IMO0001 Reserved for inherently not codable concepts without codable children: Secondary | ICD-10-CM | POA: Diagnosis not present

## 2013-07-31 DIAGNOSIS — Z96649 Presence of unspecified artificial hip joint: Secondary | ICD-10-CM | POA: Diagnosis not present

## 2013-07-31 DIAGNOSIS — D62 Acute posthemorrhagic anemia: Secondary | ICD-10-CM | POA: Diagnosis not present

## 2013-07-31 DIAGNOSIS — Z471 Aftercare following joint replacement surgery: Secondary | ICD-10-CM | POA: Diagnosis not present

## 2013-07-31 DIAGNOSIS — Z4801 Encounter for change or removal of surgical wound dressing: Secondary | ICD-10-CM | POA: Diagnosis not present

## 2013-08-02 DIAGNOSIS — Z96649 Presence of unspecified artificial hip joint: Secondary | ICD-10-CM | POA: Diagnosis not present

## 2013-08-02 DIAGNOSIS — D62 Acute posthemorrhagic anemia: Secondary | ICD-10-CM | POA: Diagnosis not present

## 2013-08-02 DIAGNOSIS — IMO0001 Reserved for inherently not codable concepts without codable children: Secondary | ICD-10-CM | POA: Diagnosis not present

## 2013-08-02 DIAGNOSIS — Z471 Aftercare following joint replacement surgery: Secondary | ICD-10-CM | POA: Diagnosis not present

## 2013-08-02 DIAGNOSIS — Z4801 Encounter for change or removal of surgical wound dressing: Secondary | ICD-10-CM | POA: Diagnosis not present

## 2013-08-06 DIAGNOSIS — N3946 Mixed incontinence: Secondary | ICD-10-CM | POA: Diagnosis not present

## 2013-08-06 DIAGNOSIS — R350 Frequency of micturition: Secondary | ICD-10-CM | POA: Diagnosis not present

## 2013-08-15 DIAGNOSIS — N318 Other neuromuscular dysfunction of bladder: Secondary | ICD-10-CM | POA: Diagnosis not present

## 2013-08-15 DIAGNOSIS — N3941 Urge incontinence: Secondary | ICD-10-CM | POA: Diagnosis not present

## 2013-08-16 DIAGNOSIS — I10 Essential (primary) hypertension: Secondary | ICD-10-CM | POA: Diagnosis not present

## 2013-08-16 DIAGNOSIS — F410 Panic disorder [episodic paroxysmal anxiety] without agoraphobia: Secondary | ICD-10-CM | POA: Diagnosis not present

## 2013-08-16 DIAGNOSIS — G4700 Insomnia, unspecified: Secondary | ICD-10-CM | POA: Diagnosis not present

## 2013-08-19 ENCOUNTER — Encounter: Payer: Self-pay | Admitting: Cardiology

## 2013-08-20 ENCOUNTER — Ambulatory Visit (INDEPENDENT_AMBULATORY_CARE_PROVIDER_SITE_OTHER): Payer: Medicare Other | Admitting: Cardiology

## 2013-08-20 ENCOUNTER — Encounter: Payer: Self-pay | Admitting: Cardiology

## 2013-08-20 VITALS — BP 132/58 | HR 65 | Ht 67.0 in | Wt 201.0 lb

## 2013-08-20 DIAGNOSIS — I4891 Unspecified atrial fibrillation: Secondary | ICD-10-CM | POA: Diagnosis not present

## 2013-08-20 DIAGNOSIS — G4730 Sleep apnea, unspecified: Secondary | ICD-10-CM | POA: Diagnosis not present

## 2013-08-20 DIAGNOSIS — I350 Nonrheumatic aortic (valve) stenosis: Secondary | ICD-10-CM

## 2013-08-20 DIAGNOSIS — I4821 Permanent atrial fibrillation: Secondary | ICD-10-CM | POA: Insufficient documentation

## 2013-08-20 DIAGNOSIS — I10 Essential (primary) hypertension: Secondary | ICD-10-CM

## 2013-08-20 DIAGNOSIS — I482 Chronic atrial fibrillation, unspecified: Secondary | ICD-10-CM

## 2013-08-20 DIAGNOSIS — I359 Nonrheumatic aortic valve disorder, unspecified: Secondary | ICD-10-CM | POA: Diagnosis not present

## 2013-08-20 MED ORDER — DILTIAZEM HCL ER COATED BEADS 180 MG PO CP24: 360.0000 mg | ORAL_CAPSULE | Freq: Every day | ORAL | Status: DC

## 2013-08-20 MED ORDER — DILTIAZEM HCL ER COATED BEADS 360 MG PO CP24: 360.0000 mg | ORAL_CAPSULE | Freq: Every day | ORAL | Status: DC

## 2013-08-20 NOTE — Patient Instructions (Signed)
We sent in your refill for Diltiazem 90 day supply with 3 refills  Your physician recommends that you continue on your current medications as directed. Please refer to the Current Medication list given to you today.  Your physician wants you to follow-up in: 6 months with Dr. Sherlyn Lick will receive a reminder letter in the mail two months in advance. If you don't receive a letter, please call our office to schedule the follow-up appointment.

## 2013-08-20 NOTE — Progress Notes (Signed)
[address] [place], [place]  [address] Phone: [phone] Fax:  [phone]  Date:  08/20/2013   ID:  [name], DOB 02-17-1936, MRN [id number]  PCP:  Elie Confer, MD  Cardiologist:  Armanda Magic, MD     History of Present Illness: [name] is a 77 y.o. female with a history of HTN, mild to moderate AS, chronic atrial fibrillation, chronic anticoagulation and OSA on CPAP.  She was recently diagnosed with severe OSA with an AHI of 34/hr and underwent CPAP titration to 7cm H2O.  She has tried several times to use the CPAP therapy but she has severe anxiety and everytime she looks at the CPAP machine her heart races and she gets severe anxiety.  She really wants to use it but her anxiety and panic attacks get the worse of her.  She got some medication for anxiety a few days ago and she is hopeful that that will help.   She denies any chest pain, SOB, DOE,  dizziness, palpitations or syncope.    Wt Readings from Last 3 Encounters:  07/17/13 203 lb (92.08 kg)  07/17/13 203 lb (92.08 kg)  07/09/13 203 lb 8 oz (92.307 kg)     Past Medical History  Diagnosis Date  . Carpal tunnel syndrome     Migrated  . Hypertension   . Hypercholesteremia   . Infected postoperative breast seroma 2006    s/p drainage  . Breast implant removal status 2006  . S/P carpal tunnel release 2005  . Retroperitoneal hematoma 03/2011  . Arthritis   . Cancer     lt breast  . GERD (gastroesophageal reflux disease)   . Fibromyalgia   . Complication of anesthesia     extremly claustophobic  . Aortic stenosis     mild to moderate AS by 07/2012 echo  . Heart murmur     mild to mod AS, mild MR, mod TR/PR by 07/2012 echo  . Hypercholesterolemia   . Menopause   . Breast cancer     ,Multifocal  . Lower back pain     Migrated  . TIA (transient ischemic attack)   . Acquired stenosis of aortic valve   . Shortness of breath     with exertion   . Sleep apnea     cpap  ordered on 07/06/13   . Urinary incontinence   . Chronic atrial fibrillation     controlled    Current Outpatient Prescriptions  Medication Sig Dispense Refill  . acetaminophen (TYLENOL) 325 MG tablet Take 650 mg by mouth every 6 (six) hours as needed for pain.      Marland Kitchen atenolol (TENORMIN) 25 MG tablet Take 25 mg by mouth every morning.       Marland Kitchen atorvastatin (LIPITOR) 40 MG tablet Take 40 mg by mouth every morning.       . Calcium Carbonate-Vitamin D (CALCIUM 600 + D PO) Take 1 tablet by mouth daily.      Marland Kitchen diltiazem (CARDIZEM CD) 360 MG 24 hr capsule Take 360 mg by mouth daily with supper.       . docusate sodium 100 MG CAPS Take 100 mg by mouth 2 (two) times daily.  10 capsule  0  . ferrous sulfate 325 (65 FE) MG tablet Take 1 tablet (325 mg total) by mouth 3 (three) times daily after meals.    3  . furosemide (LASIX) 20 MG tablet Take 20 mg by mouth every morning.       Marland Kitchen  Glucosamine 500 MG TABS Take 500 mg by mouth 2 (two) times daily.       Marland Kitchen HYDROmorphone (DILAUDID) 2 MG tablet Take 1-2 tablets (2-4 mg total) by mouth every 4 (four) hours.  80 tablet  0  . nitroGLYCERIN (NITROSTAT) 0.4 MG SL tablet Place 0.4 mg under the tongue every 5 (five) minutes as needed. For chest pain      . olmesartan (BENICAR) 20 MG tablet Take 10 mg by mouth every morning.       Marland Kitchen omeprazole (PRILOSEC) 20 MG capsule Take 20 mg by mouth daily.      . polyethylene glycol (MIRALAX / GLYCOLAX) packet Take 17 g by mouth 2 (two) times daily.  14 each  0  . Rivaroxaban (XARELTO) 20 MG TABS Take 20 mg by mouth daily with supper.       Marland Kitchen tiZANidine (ZANAFLEX) 4 MG tablet Take 1 tablet (4 mg total) by mouth every 6 (six) hours as needed (muscle spasms).  30 tablet  0   No current facility-administered medications for this visit.    Allergies:    Allergies  Allergen Reactions  . Beta Adrenergic Blockers Shortness Of Breath  . Betapace [Sotalol Hcl] Shortness Of Breath  . Other     Narcotic pain medications -  anxiety attacks  . Oxycodone Shortness Of Breath  . Crestor [Rosuvastatin Calcium] Other (See Comments)    Cough, leg cramps  . Adhesive [Tape] Rash    Adhesive bandaids   . Clonidine Derivatives Palpitations    bradycardia  . Tramadol Anxiety    Social History:  The patient  reports that she quit smoking about 46 years ago. She has never used smokeless tobacco. She reports that she does not drink alcohol or use illicit drugs.   Family History:  The patient's family history includes Heart disease in her father and mother.   ROS:  Please see the history of present illness.      All other systems reviewed and negative.   PHYSICAL EXAM: VS:  There were no vitals taken for this visit. Well nourished, well developed, in no acute distress HEENT: normal Neck: no JVD Cardiac:  normal S1, S2;irrgularly irregular; 26 early peaking SM at RUSB to LLSB Lungs:  clear to auscultation bilaterally, no wheezing, rhonchi or rales Abd: soft, nontender, no hepatomegaly Ext: no edema Skin: warm and dry Neuro:  CNs 2-12 intact, no focal abnormalities noted  EKG:       ASSESSMENT AND PLAN:  1. OSA on CPAP therapy but not tolerating due to severe anxiety  - she got a prescription for anxiety medicine to take at night and she is going to try the CPAP again.   2. HTN - well controlled  - continue Atenolol/Diltiazem/Olmesartan 3. Chronic atrial fibrillation - rate controlled  - continue Atenolol/Diltiazem/Xarelto 4. Systemic anticoagulation on Xarelto 5. Mild to moderate AS by echo 2013 - asymptomatic  She is going to [place] for 2 months and is going to take her CPAP with her so I will get a download when she gets back Followup with me in 6 months  Signed, Armanda Magic, MD 08/20/2013 11:26 AM

## 2013-08-23 ENCOUNTER — Other Ambulatory Visit: Payer: Self-pay

## 2013-08-23 ENCOUNTER — Other Ambulatory Visit: Payer: Self-pay | Admitting: Cardiology

## 2013-08-29 DIAGNOSIS — M1710 Unilateral primary osteoarthritis, unspecified knee: Secondary | ICD-10-CM | POA: Diagnosis not present

## 2013-08-29 DIAGNOSIS — M25569 Pain in unspecified knee: Secondary | ICD-10-CM | POA: Diagnosis not present

## 2013-08-29 DIAGNOSIS — IMO0002 Reserved for concepts with insufficient information to code with codable children: Secondary | ICD-10-CM | POA: Diagnosis not present

## 2013-08-29 DIAGNOSIS — Z96649 Presence of unspecified artificial hip joint: Secondary | ICD-10-CM | POA: Diagnosis not present

## 2013-09-12 ENCOUNTER — Encounter: Payer: Self-pay | Admitting: Cardiology

## 2013-09-14 ENCOUNTER — Encounter: Payer: Self-pay | Admitting: General Surgery

## 2013-10-29 DIAGNOSIS — M76899 Other specified enthesopathies of unspecified lower limb, excluding foot: Secondary | ICD-10-CM | POA: Diagnosis not present

## 2013-11-01 ENCOUNTER — Telehealth: Payer: Self-pay | Admitting: Cardiology

## 2013-11-01 NOTE — Telephone Encounter (Signed)
Pt is aware.  

## 2013-11-01 NOTE — Telephone Encounter (Signed)
New message         Pt not due back to see you until May 2015. Pt needs new replacement supplies for cpap. [facility] says she needs an appt with you before she can get replacement supplies. We are scheduling through March right now.

## 2013-11-01 NOTE — Telephone Encounter (Signed)
I called [facility] and made them aware pt saw [place] in nov 2014. I am sending the report to them so she can get her supplies.

## 2013-11-02 DIAGNOSIS — M25559 Pain in unspecified hip: Secondary | ICD-10-CM | POA: Diagnosis not present

## 2013-11-05 DIAGNOSIS — M76899 Other specified enthesopathies of unspecified lower limb, excluding foot: Secondary | ICD-10-CM | POA: Diagnosis not present

## 2013-11-08 ENCOUNTER — Other Ambulatory Visit: Payer: Self-pay

## 2013-11-08 MED ORDER — ATENOLOL 25 MG PO TABS: 25.0000 mg | ORAL_TABLET | Freq: Every morning | ORAL | Status: DC

## 2013-11-08 MED ORDER — RIVAROXABAN 20 MG PO TABS: 20.0000 mg | ORAL_TABLET | Freq: Every day | ORAL | Status: DC

## 2013-11-13 DIAGNOSIS — M76899 Other specified enthesopathies of unspecified lower limb, excluding foot: Secondary | ICD-10-CM | POA: Diagnosis not present

## 2013-11-14 DIAGNOSIS — N3946 Mixed incontinence: Secondary | ICD-10-CM | POA: Diagnosis not present

## 2013-11-14 DIAGNOSIS — N8111 Cystocele, midline: Secondary | ICD-10-CM | POA: Diagnosis not present

## 2013-11-15 DIAGNOSIS — M76899 Other specified enthesopathies of unspecified lower limb, excluding foot: Secondary | ICD-10-CM | POA: Diagnosis not present

## 2013-11-19 ENCOUNTER — Encounter: Payer: Self-pay | Admitting: Cardiology

## 2013-11-19 ENCOUNTER — Encounter: Payer: Medicare Other | Admitting: Cardiology

## 2013-11-20 DIAGNOSIS — M76899 Other specified enthesopathies of unspecified lower limb, excluding foot: Secondary | ICD-10-CM | POA: Diagnosis not present

## 2013-11-22 DIAGNOSIS — M76899 Other specified enthesopathies of unspecified lower limb, excluding foot: Secondary | ICD-10-CM | POA: Diagnosis not present

## 2013-12-03 ENCOUNTER — Encounter: Payer: Self-pay | Admitting: Cardiology

## 2013-12-25 DIAGNOSIS — G459 Transient cerebral ischemic attack, unspecified: Secondary | ICD-10-CM | POA: Diagnosis not present

## 2013-12-25 DIAGNOSIS — E7800 Pure hypercholesterolemia, unspecified: Secondary | ICD-10-CM | POA: Diagnosis not present

## 2013-12-25 DIAGNOSIS — C50919 Malignant neoplasm of unspecified site of unspecified female breast: Secondary | ICD-10-CM | POA: Diagnosis not present

## 2013-12-25 DIAGNOSIS — N183 Chronic kidney disease, stage 3 unspecified: Secondary | ICD-10-CM | POA: Diagnosis not present

## 2013-12-25 DIAGNOSIS — I10 Essential (primary) hypertension: Secondary | ICD-10-CM | POA: Diagnosis not present

## 2013-12-25 DIAGNOSIS — G4700 Insomnia, unspecified: Secondary | ICD-10-CM | POA: Diagnosis not present

## 2013-12-25 DIAGNOSIS — I4891 Unspecified atrial fibrillation: Secondary | ICD-10-CM | POA: Diagnosis not present

## 2013-12-26 DIAGNOSIS — M1710 Unilateral primary osteoarthritis, unspecified knee: Secondary | ICD-10-CM | POA: Diagnosis not present

## 2013-12-26 DIAGNOSIS — IMO0002 Reserved for concepts with insufficient information to code with codable children: Secondary | ICD-10-CM | POA: Diagnosis not present

## 2013-12-26 DIAGNOSIS — M76899 Other specified enthesopathies of unspecified lower limb, excluding foot: Secondary | ICD-10-CM | POA: Diagnosis not present

## 2013-12-26 DIAGNOSIS — M25559 Pain in unspecified hip: Secondary | ICD-10-CM | POA: Diagnosis not present

## 2013-12-26 DIAGNOSIS — Z96649 Presence of unspecified artificial hip joint: Secondary | ICD-10-CM | POA: Diagnosis not present

## 2013-12-27 NOTE — Progress Notes (Signed)
This patient was not seen.  Her OV was supposed to be removed. She was told she had to see me to get her CPAP supplies which was not true and we cancelled her OV.  Please send to appropriate person to remove this OV

## 2013-12-28 NOTE — Progress Notes (Signed)
Gina,  I asked you about this pt a few weeks ago. We ended up not seeing pt and we need to have this visit removed from the system. Who can I send this to to get this done for Dr Radford Pax?

## 2014-01-15 NOTE — Progress Notes (Signed)
This encounter was created in error - please disregard.

## 2014-01-22 ENCOUNTER — Other Ambulatory Visit: Payer: Self-pay | Admitting: General Surgery

## 2014-01-22 MED ORDER — ATENOLOL 25 MG PO TABS: 25.0000 mg | ORAL_TABLET | Freq: Every morning | ORAL | Status: DC

## 2014-01-22 MED ORDER — DILTIAZEM HCL ER COATED BEADS 180 MG PO CP24: 360.0000 mg | ORAL_CAPSULE | Freq: Every day | ORAL | Status: DC

## 2014-01-22 MED ORDER — RIVAROXABAN 20 MG PO TABS: 20.0000 mg | ORAL_TABLET | Freq: Every day | ORAL | Status: DC

## 2014-01-22 MED ORDER — FUROSEMIDE 20 MG PO TABS: 20.0000 mg | ORAL_TABLET | Freq: Every day | ORAL | Status: DC

## 2014-01-22 MED ORDER — ATORVASTATIN CALCIUM 40 MG PO TABS: 40.0000 mg | ORAL_TABLET | Freq: Every morning | ORAL | Status: DC

## 2014-01-22 MED ORDER — OLMESARTAN MEDOXOMIL 20 MG PO TABS: 10.0000 mg | ORAL_TABLET | Freq: Every morning | ORAL | Status: DC

## 2014-01-22 NOTE — Telephone Encounter (Signed)
Rx sent in for pt. 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

## 2014-01-29 ENCOUNTER — Encounter: Payer: Self-pay | Admitting: Cardiology

## 2014-01-30 DIAGNOSIS — N3946 Mixed incontinence: Secondary | ICD-10-CM | POA: Diagnosis not present

## 2014-01-30 DIAGNOSIS — N8111 Cystocele, midline: Secondary | ICD-10-CM | POA: Diagnosis not present

## 2014-02-04 ENCOUNTER — Other Ambulatory Visit (HOSPITAL_COMMUNITY): Payer: Self-pay | Admitting: Family Medicine

## 2014-02-04 DIAGNOSIS — M1610 Unilateral primary osteoarthritis, unspecified hip: Secondary | ICD-10-CM | POA: Diagnosis not present

## 2014-02-04 DIAGNOSIS — M169 Osteoarthritis of hip, unspecified: Secondary | ICD-10-CM | POA: Diagnosis not present

## 2014-02-04 DIAGNOSIS — Z1231 Encounter for screening mammogram for malignant neoplasm of breast: Secondary | ICD-10-CM

## 2014-02-19 ENCOUNTER — Telehealth: Payer: Self-pay | Admitting: Cardiology

## 2014-02-19 NOTE — Telephone Encounter (Signed)
New Message:  Pt states she is dizzy, and SOB now, 129/59 and heart rate 49 heart rate..Today BP was 123/57 and 42 for the heart rate.Marland KitchenMarland Kitchen

## 2014-02-19 NOTE — Telephone Encounter (Signed)
Pt called today because for the last 6 days pt has experience SOB and some dizziness. BP today is 129/59 and 123/57 HR 42 to 49 beats/minute. Pt has a history of A-fib. Pt heart rate is not accurate due to A-fib.  Pt has an appointment with  Jacinto Reap PA on 02/25/14 at 9:10 AM. Pt states she is going to [place] on 02/26/14. Dr Marlou Porch DOD recommends for pt to take an extra 20 mg lasix for two days; one tasblet today and one  Tomorrow only; that will help her breathing. Pt to keep her appointment with scott Kathlen Mody as scheduled. Pt verbalized understanding.

## 2014-02-20 DIAGNOSIS — M169 Osteoarthritis of hip, unspecified: Secondary | ICD-10-CM | POA: Diagnosis not present

## 2014-02-20 DIAGNOSIS — M1610 Unilateral primary osteoarthritis, unspecified hip: Secondary | ICD-10-CM | POA: Diagnosis not present

## 2014-02-21 ENCOUNTER — Telehealth: Payer: Self-pay | Admitting: Cardiology

## 2014-02-21 NOTE — Telephone Encounter (Signed)
°

## 2014-02-21 NOTE — Telephone Encounter (Signed)
Pt is aware we had already sent in rx for her.

## 2014-02-25 ENCOUNTER — Ambulatory Visit (INDEPENDENT_AMBULATORY_CARE_PROVIDER_SITE_OTHER): Payer: Medicare Other | Admitting: Physician Assistant

## 2014-02-25 ENCOUNTER — Ambulatory Visit (HOSPITAL_COMMUNITY): Payer: Medicare Other | Attending: Family Medicine | Admitting: Radiology

## 2014-02-25 ENCOUNTER — Telehealth: Payer: Self-pay | Admitting: *Deleted

## 2014-02-25 ENCOUNTER — Encounter: Payer: Self-pay | Admitting: Physician Assistant

## 2014-02-25 VITALS — BP 130/60 | HR 48 | Ht 67.0 in | Wt 184.0 lb

## 2014-02-25 DIAGNOSIS — I350 Nonrheumatic aortic (valve) stenosis: Secondary | ICD-10-CM

## 2014-02-25 DIAGNOSIS — I5032 Chronic diastolic (congestive) heart failure: Secondary | ICD-10-CM | POA: Diagnosis not present

## 2014-02-25 DIAGNOSIS — R0602 Shortness of breath: Secondary | ICD-10-CM

## 2014-02-25 DIAGNOSIS — I359 Nonrheumatic aortic valve disorder, unspecified: Secondary | ICD-10-CM | POA: Diagnosis not present

## 2014-02-25 DIAGNOSIS — R001 Bradycardia, unspecified: Secondary | ICD-10-CM

## 2014-02-25 DIAGNOSIS — I509 Heart failure, unspecified: Secondary | ICD-10-CM

## 2014-02-25 DIAGNOSIS — E7800 Pure hypercholesterolemia, unspecified: Secondary | ICD-10-CM

## 2014-02-25 DIAGNOSIS — I4819 Other persistent atrial fibrillation: Secondary | ICD-10-CM

## 2014-02-25 DIAGNOSIS — I498 Other specified cardiac arrhythmias: Secondary | ICD-10-CM

## 2014-02-25 DIAGNOSIS — I4891 Unspecified atrial fibrillation: Secondary | ICD-10-CM

## 2014-02-25 DIAGNOSIS — G4733 Obstructive sleep apnea (adult) (pediatric): Secondary | ICD-10-CM | POA: Insufficient documentation

## 2014-02-25 DIAGNOSIS — I10 Essential (primary) hypertension: Secondary | ICD-10-CM

## 2014-02-25 HISTORY — PX: TRANSTHORACIC ECHOCARDIOGRAM: SHX275

## 2014-02-25 LAB — BASIC METABOLIC PANEL
BUN: 25 mg/dL — ABNORMAL HIGH (ref 6–23)
CHLORIDE: 104 meq/L (ref 96–112)
CO2: 30 meq/L (ref 19–32)
Calcium: 10.2 mg/dL (ref 8.4–10.5)
Creatinine, Ser: 1 mg/dL (ref 0.4–1.2)
GFR: 60.76 mL/min (ref >60.00)
Glucose, Bld: 89 mg/dL (ref 70–99)
POTASSIUM: 5.1 meq/L (ref 3.5–5.1)
SODIUM: 143 meq/L (ref 135–145)

## 2014-02-25 LAB — BRAIN NATRIURETIC PEPTIDE: Pro B Natriuretic peptide (BNP): 344 pg/mL — ABNORMAL HIGH (ref 0.0–100.0)

## 2014-02-25 NOTE — Patient Instructions (Signed)
Your physician has requested that you have an echocardiogram MILD TO MODERATE AS,, HF SYMPTOMS PT LEAVING FOR [place] 02/26/14 FOR 3 MONTHS. Echocardiography is a painless test that uses sound waves to create images of your heart. It provides your doctor with information about the size and shape of your heart and how well your heart's chambers and valves are working. This procedure takes approximately one hour. There are no restrictions for this procedure.   LAB WORK TODAY; BMET, BNP  IF YOUR HEART RATE IS 100 OR HIGHER RESUME ATENOLOL AND SEE PROVIDER IN [place] PER SCOTT WEAVER, PAC [phone]  YOU WILL NEED A FOLLOW UP WITH DR. Radford Pax ONCE YOU RETURN FROM [place] IN 3 MONTHS

## 2014-02-25 NOTE — Progress Notes (Signed)
[place], [place], [place]  [address] Phone: [phone] Fax:  [phone]  Date:  02/25/2014   ID:  [name], DOB 1936-04-11, MRN [id number]  PCP:  Jonathon Bellows, MD  Cardiologist:  Dr. Fransico Him Electrophysiologist:  Dr. Thompson Grayer    History of Present Illness: [name] is a 78 y.o. female with a hx of perm AFib, mild to mod AS, HTN, OSA on CPAP, HL.  She has seen Dr. Rayann Heman in the past and she is treated with a rate control strategy for her AFib.  Last seen by Dr. Fransico Him 08/2013.  She called in recently with symptoms of dizziness and dyspnea.  Lasix was increased and she was asked to follow up today.  She leaves for [place] tomorrow.    She notes increased dyspnea and dizziness last week.  She felt much better with taking extra Lasix for 2 days.  She typically is NYHA Class 2.  She sleeps on an incline and has done so for years.  NB. She denies chest discomfort. She denies arm or jaw discomfort. She denies syncope or near-syncope.   Studies:  - Echo (07/2012):  Mild LVH, EF 55-60%, no RWMA, Gr 1 DD, mild to mod AS (mean 13 mmHg), mod AI, MAC, mild MR, mod LAE, mild RAE, mod TR, mod PI  - Nuclear (12/2011):  No ischemia, EF 58%   Recent Labs: 07/19/2013: Potassium 4.7  07/23/2013: Creatinine 1.0; Hemoglobin 11.9*   Wt Readings from Last 3 Encounters:  02/25/14 184 lb (83.462 kg)  11/19/13 204 lb (92.534 kg)  08/20/13 201 lb (91.173 kg)     Past Medical History  Diagnosis Date  . Carpal tunnel syndrome     Migrated  . Hypertension   . Hypercholesteremia   . Infected postoperative breast seroma 2006    s/p drainage  . Breast implant removal status 2006  . S/P carpal tunnel release 2005  . Retroperitoneal hematoma 03/2011  . Arthritis   . Cancer     lt breast  . GERD (gastroesophageal reflux disease)   . Fibromyalgia   . Complication of anesthesia     extremly claustophobic  . Aortic stenosis     mild to moderate AS by  07/2012 echo  . Heart murmur     mild to mod AS, mild MR, mod TR/PR by 07/2012 echo  . Hypercholesterolemia   . Menopause   . Breast cancer     ,Multifocal  . Lower back pain     Migrated  . TIA (transient ischemic attack)   . Acquired stenosis of aortic valve   . Shortness of breath     with exertion   . Sleep apnea     severe OSA with AHI 34/hr no on CPAP at 7cm H2O  . Urinary incontinence   . Chronic atrial fibrillation     controlled    Current Outpatient Prescriptions  Medication Sig Dispense Refill  . ALPRAZolam (XANAX) 0.25 MG tablet 0.25 mg as needed.      Marland Kitchen atenolol (TENORMIN) 25 MG tablet Take 1 tablet (25 mg total) by mouth every morning.  90 tablet  1  . atorvastatin (LIPITOR) 40 MG tablet Take 1 tablet (40 mg total) by mouth every morning.  90 tablet  1  . Calcium Carbonate-Vitamin D (CALCIUM 600 + D PO) Take 1 tablet by mouth daily.      Marland Kitchen diltiazem (CARDIZEM CD) 180 MG 24 hr capsule Take  2 capsules (360 mg total) by mouth daily with supper.  180 capsule  1  . furosemide (LASIX) 20 MG tablet Take 1 tablet (20 mg total) by mouth daily.  90 tablet  1  . Glucosamine 500 MG TABS Take 500 mg by mouth 2 (two) times daily.       . Melatonin 10 MG TABS Take 1 tablet by mouth daily.      . Multiple Vitamins-Iron (MULTIVITAMINS WITH IRON) TABS tablet Take 1 tablet by mouth daily.      . nitroGLYCERIN (NITROSTAT) 0.4 MG SL tablet Place 0.4 mg under the tongue every 5 (five) minutes as needed. For chest pain      . olmesartan (BENICAR) 20 MG tablet Take 0.5 tablets (10 mg total) by mouth every morning.  45 tablet  1  . omeprazole (PRILOSEC) 20 MG capsule Take 20 mg by mouth daily.      . Rivaroxaban (XARELTO) 20 MG TABS tablet Take 1 tablet (20 mg total) by mouth daily with supper.  90 tablet  1  . zolpidem (AMBIEN) 10 MG tablet        No current facility-administered medications for this visit.    Allergies:   Beta adrenergic blockers; Betapace; Other; Oxycodone; Crestor;  Adhesive; Clonidine derivatives; and Tramadol   Social History:  The patient  reports that she quit smoking about 46 years ago. She has never used smokeless tobacco. She reports that she does not drink alcohol or use illicit drugs.   Family History:  The patient's family history includes Heart disease in her father and mother.   ROS:  Please see the history of present illness.   She denies cough or wheezing.   All other systems reviewed and negative.   PHYSICAL EXAM: VS:  BP 130/60  Pulse 48  Ht 5\' 7"  (1.702 m)  Wt 184 lb (83.462 kg)  BMI 28.81 kg/m2 Well nourished, well developed, in no acute distress HEENT: normal Neck: no JVD Cardiac:  normal S1, S2; RRR; 2/6 systolic murmurat the RUSB Lungs:  clear to auscultation bilaterally, no wheezing, rhonchi or rales Abd: soft, nontender, no hepatomegaly Ext: very trace bilateral LE edema Skin: warm and dry Neuro:  CNs 2-12 intact, no focal abnormalities noted  EKG:  Atrial fibrillation, HR 48     ASSESSMENT AND PLAN:  1. Shortness of breath:  Her symptoms are most consistent with volume excess. She is clearly improved on higher dose Lasix. By exam, her aortic stenosis does not appear to be worsened. However, it has been greater than one year since her last echocardiogram. Check a basic metabolic panel and BNP today. Obtain follow up echocardiogram. 2. Bradycardia:  She has a slow ventricular response. Question if this contributed to her dizziness. I will discontinue her atenolol. She is leaving for [place] tomorrow. I have advised her to monitor her heart rate while she is gone. If her heart rate remains around 100 or higher, she should resume her atenolol. 3. Chronic diastolic CHF (congestive heart failure):  Volume appears stable. Check a basic metabolic panel and BNP. If BNP remains significantly elevated, continue higher dose Lasix. 4. Persistent atrial fibrillation:  Continue Xarelto. 5. Aortic stenosis: Obtain echocardiogram  today. 6. HTN (hypertension): Controlled. 7. Hypercholesteremia: Continue statin. 8. OSA (obstructive sleep apnea): Continue CPAP. 9. Disposition: Followup with Dr. Radford Pax after return from her trip.  Signed, Richardson Dopp, PA-C  02/25/2014 9:50 AM

## 2014-02-25 NOTE — Telephone Encounter (Signed)
pt notified about lab results and echo results with verbal understanding,

## 2014-02-25 NOTE — Progress Notes (Signed)
Echocardiogram performed.  

## 2014-05-15 ENCOUNTER — Encounter: Payer: Self-pay | Admitting: Interventional Cardiology

## 2014-06-03 ENCOUNTER — Other Ambulatory Visit (HOSPITAL_COMMUNITY): Payer: Self-pay | Admitting: Oncology

## 2014-06-03 DIAGNOSIS — Z1231 Encounter for screening mammogram for malignant neoplasm of breast: Secondary | ICD-10-CM

## 2014-06-05 ENCOUNTER — Other Ambulatory Visit: Payer: Self-pay | Admitting: Cardiology

## 2014-06-06 DIAGNOSIS — M1990 Unspecified osteoarthritis, unspecified site: Secondary | ICD-10-CM | POA: Diagnosis not present

## 2014-06-06 DIAGNOSIS — Z9660 Presence of unspecified orthopedic joint implant: Secondary | ICD-10-CM | POA: Diagnosis not present

## 2014-06-06 DIAGNOSIS — M1710 Unilateral primary osteoarthritis, unspecified knee: Secondary | ICD-10-CM | POA: Diagnosis not present

## 2014-06-07 ENCOUNTER — Ambulatory Visit (HOSPITAL_COMMUNITY)
Admission: RE | Admit: 2014-06-07 | Discharge: 2014-06-07 | Disposition: A | Discharge status: Home / Self Care | Payer: Medicare Other | Source: Ambulatory Visit | Attending: Oncology | Admitting: Oncology

## 2014-06-07 DIAGNOSIS — Z1231 Encounter for screening mammogram for malignant neoplasm of breast: Secondary | ICD-10-CM | POA: Insufficient documentation

## 2014-06-26 DIAGNOSIS — M169 Osteoarthritis of hip, unspecified: Secondary | ICD-10-CM | POA: Diagnosis not present

## 2014-06-26 DIAGNOSIS — M1610 Unilateral primary osteoarthritis, unspecified hip: Secondary | ICD-10-CM | POA: Diagnosis not present

## 2014-06-27 ENCOUNTER — Encounter: Payer: Self-pay | Admitting: Cardiology

## 2014-06-27 ENCOUNTER — Other Ambulatory Visit: Payer: Self-pay | Admitting: General Surgery

## 2014-06-27 ENCOUNTER — Ambulatory Visit (INDEPENDENT_AMBULATORY_CARE_PROVIDER_SITE_OTHER): Payer: Medicare Other | Admitting: Cardiology

## 2014-06-27 ENCOUNTER — Encounter: Payer: Self-pay | Admitting: General Surgery

## 2014-06-27 VITALS — BP 142/80 | HR 80 | Ht 67.0 in | Wt 183.6 lb

## 2014-06-27 DIAGNOSIS — I359 Nonrheumatic aortic valve disorder, unspecified: Secondary | ICD-10-CM | POA: Diagnosis not present

## 2014-06-27 DIAGNOSIS — I482 Chronic atrial fibrillation, unspecified: Secondary | ICD-10-CM

## 2014-06-27 DIAGNOSIS — G4733 Obstructive sleep apnea (adult) (pediatric): Secondary | ICD-10-CM | POA: Diagnosis not present

## 2014-06-27 DIAGNOSIS — I4891 Unspecified atrial fibrillation: Secondary | ICD-10-CM

## 2014-06-27 DIAGNOSIS — I5032 Chronic diastolic (congestive) heart failure: Secondary | ICD-10-CM | POA: Insufficient documentation

## 2014-06-27 DIAGNOSIS — I350 Nonrheumatic aortic (valve) stenosis: Secondary | ICD-10-CM

## 2014-06-27 DIAGNOSIS — I10 Essential (primary) hypertension: Secondary | ICD-10-CM

## 2014-06-27 DIAGNOSIS — E669 Obesity, unspecified: Secondary | ICD-10-CM

## 2014-06-27 LAB — CBC
HCT: 43.2 % (ref 36.0–46.0)
Hemoglobin: 14.4 g/dL (ref 12.0–15.0)
MCHC: 33.3 g/dL (ref 30.0–36.0)
MCV: 97.8 fl (ref 78.0–100.0)
PLATELETS: 205 10*3/uL (ref 150.0–400.0)
RBC: 4.42 Mil/uL (ref 3.87–5.11)
RDW: 12.9 % (ref 11.5–15.5)
WBC: 11.4 10*3/uL — ABNORMAL HIGH (ref 4.0–10.5)

## 2014-06-27 LAB — BASIC METABOLIC PANEL
BUN: 25 mg/dL — ABNORMAL HIGH (ref 6–23)
CALCIUM: 9.9 mg/dL (ref 8.4–10.5)
CO2: 25 mEq/L (ref 19–32)
Chloride: 105 mEq/L (ref 96–112)
Creatinine, Ser: 0.9 mg/dL (ref 0.4–1.2)
GFR: 62.22 mL/min (ref >60.00)
Glucose, Bld: 132 mg/dL — ABNORMAL HIGH (ref 70–99)
Potassium: 4.5 mEq/L (ref 3.5–5.1)
SODIUM: 139 meq/L (ref 135–145)

## 2014-06-27 MED ORDER — RIVAROXABAN 20 MG PO TABS: 20.0000 mg | ORAL_TABLET | Freq: Every day | ORAL | Status: DC

## 2014-06-27 NOTE — Progress Notes (Signed)
[place], [place], [place]  [address] Phone: [phone] Fax:  [phone]  Date:  06/27/2014   ID:  [name], DOB 12-04-1935, MRN [id number]  PCP:  Jonathon Bellows, MD  Cardiologist:  Fransico Him, MD     History of Present Illness: [name] is a 78 y.o. female with a history of HTN, mild to moderate AS/MR/AR, chronic diastolic CHF,chronic atrial fibrillation, chronic anticoagulation and OSA on CPAP. She was recently diagnosed with severe OSA with an AHI of 34/hr and underwent CPAP titration to 7cm H2O. She is using her CPAP and tolerating it well.  She uses a nasal pillow mask.  She feels rested in the am and has no daytime sleepiness.  Since going on the CPAP she feels more alert and less tired.   She saw Richardson Dopp PA in May for SOB and BNP was mildly elevated.  Her 2D echo showed stable mild to moderate AS/AR .  She is doing well today.  She denies any chest pain, SOB, DOE, LE edema, dizziness, palpitations or syncope.  Wt Readings from Last 3 Encounters:  06/27/14 183 lb 9.6 oz (83.28 kg)  02/25/14 184 lb (83.462 kg)  11/19/13 204 lb (92.534 kg)     Past Medical History  Diagnosis Date  . Carpal tunnel syndrome     Migrated  . Hypertension   . Hypercholesteremia   . Infected postoperative breast seroma 2006    s/p drainage  . Breast implant removal status 2006  . S/P carpal tunnel release 2005  . Retroperitoneal hematoma 03/2011  . Arthritis   . Cancer     lt breast  . GERD (gastroesophageal reflux disease)   . Fibromyalgia   . Complication of anesthesia     extremly claustophobic  . Aortic stenosis     mild to moderate AS by 07/2012 echo;  Echo (02/25/14):  Mod LVH, EF 55-60%, no RWMA, mild to mod AS (mean 16 mmHg), mild to mod AI, mild to mod MR, mod to severe LAE, mild RVE, mild PI, mild TR.  Marland Kitchen Heart murmur     mild to mod AS, mild MR, mod TR/PR by 07/2012 echo  . Hypercholesterolemia   . Menopause   . Breast cancer    ,Multifocal  . Lower back pain     Migrated  . TIA (transient ischemic attack)   . Acquired stenosis of aortic valve   . Shortness of breath     with exertion   . Sleep apnea     severe OSA with AHI 34/hr no on CPAP at 7cm H2O  . Urinary incontinence   . Chronic atrial fibrillation     controlled  . Chronic diastolic CHF (congestive heart failure), NYHA class 1     Current Outpatient Prescriptions  Medication Sig Dispense Refill  . ALPRAZolam (XANAX) 0.25 MG tablet 0.25 mg as needed.      Marland Kitchen atorvastatin (LIPITOR) 40 MG tablet Take 1 tablet (40 mg total) by mouth every morning.  90 tablet  1  . Calcium Carbonate-Vitamin D (CALCIUM 600 + D PO) Take 1 tablet by mouth daily.      Marland Kitchen diltiazem (CARDIZEM CD) 180 MG 24 hr capsule Take 2 capsules (360 mg total) by mouth daily with supper.  180 capsule  1  . furosemide (LASIX) 20 MG tablet Take 1 tablet (20 mg total) by mouth daily.  90 tablet  1  . Glucosamine 500 MG TABS Take 500  mg by mouth 2 (two) times daily.       . Melatonin 10 MG TABS Take 1 tablet by mouth daily.      . Multiple Vitamins-Iron (MULTIVITAMINS WITH IRON) TABS tablet Take 1 tablet by mouth daily.      . nitroGLYCERIN (NITROSTAT) 0.4 MG SL tablet Place 0.4 mg under the tongue every 5 (five) minutes as needed. For chest pain      . olmesartan (BENICAR) 20 MG tablet Take 0.5 tablets (10 mg total) by mouth every morning.  45 tablet  1  . omeprazole (PRILOSEC) 20 MG capsule Take 20 mg by mouth daily.      Alveda Reasons 20 MG TABS tablet TAKE 1 TABLET (20 MG TOTAL) BY MOUTH DAILY WITH SUPPER.  30 tablet  0  . zolpidem (AMBIEN) 10 MG tablet        No current facility-administered medications for this visit.    Allergies:    Allergies  Allergen Reactions  . Beta Adrenergic Blockers Shortness Of Breath  . Betapace [Sotalol Hcl] Shortness Of Breath  . Other     Narcotic pain medications - anxiety attacks  . Oxycodone Shortness Of Breath  . Crestor [Rosuvastatin Calcium] Other  (See Comments)    Cough, leg cramps  . Adhesive [Tape] Rash    Adhesive bandaids   . Clonidine Derivatives Palpitations    bradycardia  . Tramadol Anxiety    Social History:  The patient  reports that she quit smoking about 46 years ago. She has never used smokeless tobacco. She reports that she does not drink alcohol or use illicit drugs.   Family History:  The patient's family history includes Heart disease in her father and mother.   ROS:  Please see the history of present illness.      All other systems reviewed and negative.   PHYSICAL EXAM: VS:  BP 142/80  Pulse 80  Ht 5\' 7"  (1.702 m)  Wt 183 lb 9.6 oz (83.28 kg)  BMI 28.75 kg/m2 Well nourished, well developed, in no acute distress HEENT: normal Neck: no JVD Cardiac:  normal S1, S2; irregularly irregular; 2/6 SM at RUSB to LLSB Lungs:  clear to auscultation bilaterally, no wheezing, rhonchi or rales Abd: soft, nontender, no hepatomegaly Ext: no edema Skin: warm and dry Neuro:  CNs 2-12 intact, no focal abnormalities noted  ASSESSMENT AND PLAN:  1. OSA on CPAP therapy and tolerating well - I will get a d/l from her DME.  2. HTN - well controlled - continue Diltiazem/Olmesartan  3. Chronic atrial fibrillation - rate controlled - continue Diltiazem/Xarelto  - check NOAC panel 4. Systemic anticoagulation on Xarelto 5. Mild to moderate AS/AI and MR by echo 2015 - asymptomatic 6. Chronic diastolic CHF NYHA Class I - appears euvolmeic on exam.  She will continue Lasix.  Followup with me in 6 months  Signed, Fransico Him, MD 06/27/2014 8:53 AM

## 2014-06-27 NOTE — Patient Instructions (Signed)
Your physician recommends that you continue on your current medications as directed. Please refer to the Current Medication list given to you today.  Continue current CPAP/BiPAP Settings. We will contact [facility] to get a download on you for your Machine.   Your physician recommends that you go to the lab today for a BMET and CBC  Your physician wants you to follow-up in: 6 months with Dr Mallie Snooks will receive a reminder letter in the mail two months in advance. If you don't receive a letter, please call our office to schedule the follow-up appointment.

## 2014-07-08 DIAGNOSIS — R350 Frequency of micturition: Secondary | ICD-10-CM | POA: Diagnosis not present

## 2014-07-08 DIAGNOSIS — N3946 Mixed incontinence: Secondary | ICD-10-CM | POA: Diagnosis not present

## 2014-07-08 DIAGNOSIS — R351 Nocturia: Secondary | ICD-10-CM | POA: Diagnosis not present

## 2014-07-08 DIAGNOSIS — N8111 Cystocele, midline: Secondary | ICD-10-CM | POA: Diagnosis not present

## 2014-07-10 DIAGNOSIS — Z23 Encounter for immunization: Secondary | ICD-10-CM | POA: Diagnosis not present

## 2014-07-10 DIAGNOSIS — N390 Urinary tract infection, site not specified: Secondary | ICD-10-CM | POA: Diagnosis not present

## 2014-07-26 ENCOUNTER — Encounter: Payer: Self-pay | Admitting: Cardiology

## 2014-07-29 DIAGNOSIS — I10 Essential (primary) hypertension: Secondary | ICD-10-CM | POA: Diagnosis not present

## 2014-07-29 DIAGNOSIS — M1990 Unspecified osteoarthritis, unspecified site: Secondary | ICD-10-CM | POA: Diagnosis not present

## 2014-07-29 DIAGNOSIS — G4733 Obstructive sleep apnea (adult) (pediatric): Secondary | ICD-10-CM | POA: Diagnosis not present

## 2014-07-29 DIAGNOSIS — I350 Nonrheumatic aortic (valve) stenosis: Secondary | ICD-10-CM | POA: Diagnosis not present

## 2014-07-29 DIAGNOSIS — Z853 Personal history of malignant neoplasm of breast: Secondary | ICD-10-CM | POA: Diagnosis not present

## 2014-07-29 DIAGNOSIS — I4891 Unspecified atrial fibrillation: Secondary | ICD-10-CM | POA: Diagnosis not present

## 2014-07-29 DIAGNOSIS — C50919 Malignant neoplasm of unspecified site of unspecified female breast: Secondary | ICD-10-CM | POA: Diagnosis not present

## 2014-07-29 DIAGNOSIS — Z1389 Encounter for screening for other disorder: Secondary | ICD-10-CM | POA: Diagnosis not present

## 2014-07-29 DIAGNOSIS — Z23 Encounter for immunization: Secondary | ICD-10-CM | POA: Diagnosis not present

## 2014-07-29 DIAGNOSIS — E780 Pure hypercholesterolemia: Secondary | ICD-10-CM | POA: Diagnosis not present

## 2014-07-29 DIAGNOSIS — Z0000 Encounter for general adult medical examination without abnormal findings: Secondary | ICD-10-CM | POA: Diagnosis not present

## 2014-07-31 DIAGNOSIS — M25551 Pain in right hip: Secondary | ICD-10-CM | POA: Diagnosis not present

## 2014-07-31 DIAGNOSIS — M1712 Unilateral primary osteoarthritis, left knee: Secondary | ICD-10-CM | POA: Diagnosis not present

## 2014-07-31 DIAGNOSIS — M1711 Unilateral primary osteoarthritis, right knee: Secondary | ICD-10-CM | POA: Diagnosis not present

## 2014-07-31 DIAGNOSIS — M1611 Unilateral primary osteoarthritis, right hip: Secondary | ICD-10-CM | POA: Diagnosis not present

## 2014-08-05 DIAGNOSIS — R350 Frequency of micturition: Secondary | ICD-10-CM | POA: Diagnosis not present

## 2014-08-05 DIAGNOSIS — N3941 Urge incontinence: Secondary | ICD-10-CM | POA: Diagnosis not present

## 2014-08-09 DIAGNOSIS — N3946 Mixed incontinence: Secondary | ICD-10-CM | POA: Diagnosis not present

## 2014-08-12 ENCOUNTER — Telehealth: Payer: Self-pay | Admitting: Cardiology

## 2014-08-12 NOTE — Telephone Encounter (Signed)
New message     Pt is having an interstim placement.  Will she need to hold her xeralto?  Please fax clearance to [phone]

## 2014-08-13 ENCOUNTER — Other Ambulatory Visit: Payer: Self-pay | Admitting: Urology

## 2014-08-13 NOTE — Telephone Encounter (Signed)
Routing to [employer] for review and recommendations.

## 2014-08-13 NOTE — Telephone Encounter (Signed)
What is interstim?

## 2014-08-13 NOTE — Telephone Encounter (Signed)
Called to verify Xarelto dose. Patient confirms dosage is 20 mg at supper.  Procedure is scheduled for November 5.   To Dr. Radford Pax for review and recommendations.

## 2014-08-13 NOTE — Telephone Encounter (Signed)
RN at [facility] states that an InterStim is a device placed under anesthesia to help control an overactive bladder and urgency.

## 2014-08-13 NOTE — Telephone Encounter (Signed)
She has a history of TIA and has chronic afib so she needs to be bridged with Lovenox.  Please forward to [facility] for bridge

## 2014-08-15 NOTE — Telephone Encounter (Signed)
Spoke with pt and Pam with [facility].  Pt only needs to hold Xarelto x 24 hrs prior to procedure (low bleeding risk) and so no need for Lovenox bridge.  Pt will take last dose of Xarelto on 11/3, no Xarelto on 11/4 and then procedure on 11/5.  As long as she has evidence of bleeding, she should restart her Xarelto the night of 11/5, keeping in mind that she will be completely anticoagulated within 4 hrs.    Will fax instruction to [employer] at [phone] as well.

## 2014-08-15 NOTE — Telephone Encounter (Signed)
agree

## 2014-08-21 ENCOUNTER — Encounter (HOSPITAL_BASED_OUTPATIENT_CLINIC_OR_DEPARTMENT_OTHER): Payer: Self-pay | Admitting: *Deleted

## 2014-08-21 NOTE — H&P (Signed)
History of Present Illness   [name] has urge and stress incontinence, but primarily an overactive bladder with frequency. She had a PNE on Monday. She is here for reevaluation.   Urinalysis: I reviewed, negative.   [name] says she is going less frequently and she has an 80% reduction of urge incontinence. She is very pleased. She is using the right side. She does not feel it well on the left.   The device was turned off. The device was removed. There were no skin changes.   She would like the Interstim placed before she goes away in mid November.    Past Medical History Problems  1. History of Arthritis 2. History of Atrial fibrillation (I48.91) 3. History of Breast Cancer 4. History of esophageal reflux (Z87.19) 5. History of hypercholesterolemia (Z86.39) 6. History of hypertension (Z86.79) 7. History of sleep apnea (Z87.09) 8. History of Myocardial Infarction Arrhythmias 9. History of Stroke syndrome (I63.9)  Surgical History Problems  1. History of Abdominoplasty 2. History of Appendectomy 3. History of Breast Surgery 4. History of Breast Surgery 5. History of Breast Surgery Mastectomy 6. History of Shoulder Arthroplasty Total Shoulder Replacement 7. History of Tubal Ligation  Current Meds 1. Benicar 20 MG Oral Tablet;  Therapy: (Recorded:10Oct2014) to Recorded 2. Calcium Citrate TABS;  Therapy: (Recorded:10Oct2014) to Recorded 3. Cephalexin 500 MG Oral Capsule; TAKE 1 CAPSULE 3 times daily;  Therapy: 21Sep2015 to (Evaluate:24Sep2015); Last Rx:21Sep2015 Ordered 4. Diltiazem HCl TABS;  Therapy: (Recorded:10Oct2014) to Recorded 5. Glucosamine 500 MG Oral Capsule;  Therapy: (Recorded:10Oct2014) to Recorded 6. Lasix 20 MG Oral Tablet;  Therapy: (Recorded:10Oct2014) to Recorded 7. Lipitor 40 MG Oral Tablet;  Therapy: (Recorded:10Oct2014) to Recorded 8. Myrbetriq 50 MG Oral Tablet Extended Release 24 Hour; Take 1 tablet daily;  Therapy: 29Oct2014 to  (Evaluate:24Oct2015); Last Rx:29Oct2014 Ordered 9. PriLOSEC OTC 20 MG Oral Tablet Delayed Release;  Therapy: (Recorded:28Jan2015) to Recorded 10. Toviaz 4 MG Oral Tablet Extended Release 24 Hour; TAKE 4 MG Daily;   Therapy: [id number] to (Evaluate:23Jan2016); Last Rx:28Jan2015 Ordered 11. Tylenol TABS;   Therapy: (Recorded:28Jan2015) to Recorded 12. VESIcare 5 MG Oral Tablet; a tablet daily for 30 days;   Therapy: [id number] to (Last Rx:28Jan2015) Ordered 13. Xarelto 20 MG Oral Tablet;   Therapy: (Recorded:10Oct2014) to Recorded  Allergies Medication  1. Betapace TABS 2. Clonidine and derivs 3. Crestor TABS 4. OxyCODONE HCl TABS Non-Medication  5. Adhesive Tape  Family History Problems  1. Family history of Death In The Family Father   age 74 due to "heart" 2. Family history of Death In The Family Mother   age 60 due to "heart" 3. Family history of Family Health Status Number Of Children   one son and four daughters  Social History Problems  1. Denied: History of Alcohol Use 2. Denied: History of Caffeine Use 3. Former smoker [phone])   smoked for 10 years, quit 46 years ago as of 07/27/13 office visit  Vitals Vital Signs [Data Includes: Last 1 Day]  Recorded: 23Oct2015 11:03AM  Blood Pressure: 115 / 62 Temperature: 97 F Heart Rate: 72  Results/Data  09 Aug 2014 10:48 AM  UA With REFLEX    COLOR YELLOW     APPEARANCE CLEAR     SPECIFIC GRAVITY 1.010     pH 5.0     GLUCOSE NEG     BILIRUBIN NEG     KETONE NEG     BLOOD NEG     PROTEIN NEG     UROBILINOGEN  0.2     NITRITE NEG     LEUKOCYTE ESTERASE NEG     Assessment Assessed  1. Urge and stress incontinence (N39.46) 2. Nocturia (R35.1)  Plan Urge and stress incontinence  1. Follow-up Schedule Surgery Office  Follow-up  Status: Complete  Done: 23Oct2015  Discussion/Summary   Schedule for Interstim stage 1 and 2. I am very pleased for [name].   Potential patient for Interstim stage 1  and 2.  After a thorough review of the management options for the patient's condition the patient  elected to proceed with surgical therapy as noted above. We have discussed the potential benefits and risks of the procedure, side effects of the proposed treatment, the likelihood of the patient achieving the goals of the procedure, and any potential problems that might occur during the procedure or recuperation. Informed consent has been obtained.

## 2014-08-21 NOTE — Progress Notes (Signed)
NPO AFTER MN. ARRIVE AT 0930. NEEDS ISTAT. CURRENT EKG IN CHART AND EPIC. WILL TAKE PRILOSEC, LIPITOR, AND BENICAR AM DOS W/ SIPS OF WATER.

## 2014-08-22 ENCOUNTER — Ambulatory Visit (HOSPITAL_BASED_OUTPATIENT_CLINIC_OR_DEPARTMENT_OTHER): Payer: Medicare Other | Admitting: Anesthesiology

## 2014-08-22 ENCOUNTER — Encounter (HOSPITAL_BASED_OUTPATIENT_CLINIC_OR_DEPARTMENT_OTHER)
Admission: RE | Disposition: A | Discharge status: Home / Self Care | Payer: Self-pay | Source: Ambulatory Visit | Attending: Urology

## 2014-08-22 ENCOUNTER — Ambulatory Visit (HOSPITAL_COMMUNITY): Payer: Medicare Other

## 2014-08-22 ENCOUNTER — Ambulatory Visit (HOSPITAL_BASED_OUTPATIENT_CLINIC_OR_DEPARTMENT_OTHER)
Admission: RE | Admit: 2014-08-22 | Discharge: 2014-08-22 | Disposition: A | Discharge status: Home / Self Care | Payer: Medicare Other | Source: Ambulatory Visit | Attending: Urology | Admitting: Urology

## 2014-08-22 ENCOUNTER — Encounter (HOSPITAL_BASED_OUTPATIENT_CLINIC_OR_DEPARTMENT_OTHER): Payer: Self-pay | Admitting: *Deleted

## 2014-08-22 DIAGNOSIS — M1990 Unspecified osteoarthritis, unspecified site: Secondary | ICD-10-CM | POA: Insufficient documentation

## 2014-08-22 DIAGNOSIS — N3946 Mixed incontinence: Secondary | ICD-10-CM | POA: Diagnosis not present

## 2014-08-22 DIAGNOSIS — I4891 Unspecified atrial fibrillation: Secondary | ICD-10-CM | POA: Diagnosis not present

## 2014-08-22 DIAGNOSIS — G4730 Sleep apnea, unspecified: Secondary | ICD-10-CM | POA: Insufficient documentation

## 2014-08-22 DIAGNOSIS — I350 Nonrheumatic aortic (valve) stenosis: Secondary | ICD-10-CM | POA: Insufficient documentation

## 2014-08-22 DIAGNOSIS — R011 Cardiac murmur, unspecified: Secondary | ICD-10-CM | POA: Insufficient documentation

## 2014-08-22 DIAGNOSIS — E780 Pure hypercholesterolemia: Secondary | ICD-10-CM | POA: Insufficient documentation

## 2014-08-22 DIAGNOSIS — Z87891 Personal history of nicotine dependence: Secondary | ICD-10-CM | POA: Insufficient documentation

## 2014-08-22 DIAGNOSIS — R32 Unspecified urinary incontinence: Secondary | ICD-10-CM | POA: Diagnosis not present

## 2014-08-22 DIAGNOSIS — Z853 Personal history of malignant neoplasm of breast: Secondary | ICD-10-CM | POA: Diagnosis not present

## 2014-08-22 DIAGNOSIS — Z419 Encounter for procedure for purposes other than remedying health state, unspecified: Secondary | ICD-10-CM

## 2014-08-22 DIAGNOSIS — R351 Nocturia: Secondary | ICD-10-CM | POA: Diagnosis not present

## 2014-08-22 DIAGNOSIS — Z8673 Personal history of transient ischemic attack (TIA), and cerebral infarction without residual deficits: Secondary | ICD-10-CM | POA: Diagnosis not present

## 2014-08-22 DIAGNOSIS — I509 Heart failure, unspecified: Secondary | ICD-10-CM | POA: Insufficient documentation

## 2014-08-22 DIAGNOSIS — I252 Old myocardial infarction: Secondary | ICD-10-CM | POA: Diagnosis not present

## 2014-08-22 DIAGNOSIS — R350 Frequency of micturition: Secondary | ICD-10-CM | POA: Diagnosis not present

## 2014-08-22 DIAGNOSIS — K219 Gastro-esophageal reflux disease without esophagitis: Secondary | ICD-10-CM | POA: Insufficient documentation

## 2014-08-22 DIAGNOSIS — M797 Fibromyalgia: Secondary | ICD-10-CM | POA: Insufficient documentation

## 2014-08-22 DIAGNOSIS — I10 Essential (primary) hypertension: Secondary | ICD-10-CM | POA: Diagnosis not present

## 2014-08-22 DIAGNOSIS — N3941 Urge incontinence: Secondary | ICD-10-CM | POA: Diagnosis not present

## 2014-08-22 HISTORY — DX: Presence of spectacles and contact lenses: Z97.3

## 2014-08-22 HISTORY — DX: Dependence on other enabling machines and devices: Z99.89

## 2014-08-22 HISTORY — PX: INTERSTIM IMPLANT PLACEMENT: SHX5130

## 2014-08-22 HISTORY — DX: Urge incontinence: N39.41

## 2014-08-22 HISTORY — DX: Obstructive sleep apnea (adult) (pediatric): G47.33

## 2014-08-22 HISTORY — DX: Personal history of transient ischemic attack (TIA), and cerebral infarction without residual deficits: Z86.73

## 2014-08-22 HISTORY — DX: Hyperlipidemia, unspecified: E78.5

## 2014-08-22 HISTORY — DX: Personal history of malignant neoplasm of breast: Z85.3

## 2014-08-22 LAB — POCT I-STAT, CHEM 8
BUN: 14 mg/dL (ref 6–23)
BUN: 18 mg/dL (ref 6–23)
CALCIUM ION: 1.31 mmol/L — AB (ref 1.13–1.30)
CREATININE: 0.7 mg/dL (ref 0.50–1.10)
Calcium, Ion: 1.28 mmol/L (ref 1.13–1.30)
Chloride: 105 mEq/L (ref 96–112)
Chloride: 105 mEq/L (ref 96–112)
Creatinine, Ser: 1.4 mg/dL — ABNORMAL HIGH (ref 0.50–1.10)
Glucose, Bld: 83 mg/dL (ref 70–99)
Glucose, Bld: 91 mg/dL (ref 70–99)
HCT: 46 % (ref 36.0–46.0)
HCT: 46 % (ref 36.0–46.0)
Hemoglobin: 15.6 g/dL — ABNORMAL HIGH (ref 12.0–15.0)
Hemoglobin: 15.6 g/dL — ABNORMAL HIGH (ref 12.0–15.0)
Potassium: 3.9 mEq/L (ref 3.7–5.3)
Potassium: 4.2 mEq/L (ref 3.7–5.3)
Sodium: 144 mEq/L (ref 137–147)
Sodium: 144 mEq/L (ref 137–147)
TCO2: 25 mmol/L (ref 0–100)
TCO2: 26 mmol/L (ref 0–100)

## 2014-08-22 SURGERY — INSERTION, SACRAL NERVE STIMULATOR, INTERSTIM, STAGE 1
Anesthesia: Monitor Anesthesia Care | Site: Back

## 2014-08-22 MED ORDER — CEFAZOLIN SODIUM-DEXTROSE 2-3 GM-% IV SOLR
2.0000 g | INTRAVENOUS | Status: AC
  Administered 2014-08-22: 2 g via INTRAVENOUS
  Filled 2014-08-22: qty 50

## 2014-08-22 MED ORDER — LIDOCAINE-EPINEPHRINE (PF) 1 %-1:200000 IJ SOLN
INTRAMUSCULAR | Status: DC | PRN
  Administered 2014-08-22: 23 mL

## 2014-08-22 MED ORDER — ONDANSETRON HCL 4 MG/2ML IJ SOLN
INTRAMUSCULAR | Status: DC | PRN
  Administered 2014-08-22: 4 mg via INTRAVENOUS

## 2014-08-22 MED ORDER — CEFAZOLIN SODIUM-DEXTROSE 2-3 GM-% IV SOLR
INTRAVENOUS | Status: AC
  Filled 2014-08-22: qty 50

## 2014-08-22 MED ORDER — CEFAZOLIN SODIUM 1-5 GM-% IV SOLN
1.0000 g | INTRAVENOUS | Status: DC
  Filled 2014-08-22: qty 50

## 2014-08-22 MED ORDER — KETAMINE HCL 50 MG/ML IJ SOLN
INTRAMUSCULAR | Status: AC
  Filled 2014-08-22: qty 10

## 2014-08-22 MED ORDER — FENTANYL CITRATE 0.05 MG/ML IJ SOLN
INTRAMUSCULAR | Status: DC | PRN
  Administered 2014-08-22: 12.5 ug via INTRAVENOUS
  Administered 2014-08-22: 50 ug via INTRAVENOUS
  Administered 2014-08-22: 25 ug via INTRAVENOUS
  Administered 2014-08-22: 12.5 ug via INTRAVENOUS

## 2014-08-22 MED ORDER — PROPOFOL 10 MG/ML IV EMUL
INTRAVENOUS | Status: DC | PRN
  Administered 2014-08-22: 75 ug/kg/min via INTRAVENOUS

## 2014-08-22 MED ORDER — BUPIVACAINE-EPINEPHRINE 0.5% -1:200000 IJ SOLN
INTRAMUSCULAR | Status: DC | PRN
  Administered 2014-08-22: 23 mL

## 2014-08-22 MED ORDER — FENTANYL CITRATE 0.05 MG/ML IJ SOLN
25.0000 ug | INTRAMUSCULAR | Status: DC | PRN
  Filled 2014-08-22: qty 1

## 2014-08-22 MED ORDER — HYDROMORPHONE HCL 2 MG PO TABS: 2.0000 mg | ORAL_TABLET | Freq: Four times a day (QID) | ORAL | Status: DC | PRN

## 2014-08-22 MED ORDER — SODIUM CHLORIDE 0.9 % IV SOLN
500.0000 mg | INTRAVENOUS | Status: DC | PRN
  Administered 2014-08-22: 10 ug/kg/min via INTRAVENOUS

## 2014-08-22 MED ORDER — PROPOFOL 10 MG/ML IV BOLUS
INTRAVENOUS | Status: DC | PRN
  Administered 2014-08-22 (×2): 40 mg via INTRAVENOUS

## 2014-08-22 MED ORDER — LACTATED RINGERS IV SOLN
INTRAVENOUS | Status: DC
  Administered 2014-08-22: 11:00:00 via INTRAVENOUS
  Filled 2014-08-22: qty 1000

## 2014-08-22 MED ORDER — FENTANYL CITRATE 0.05 MG/ML IJ SOLN
INTRAMUSCULAR | Status: AC
  Filled 2014-08-22: qty 2

## 2014-08-22 MED ORDER — LACTATED RINGERS IV SOLN
INTRAVENOUS | Status: DC
  Filled 2014-08-22: qty 1000

## 2014-08-22 MED ORDER — STERILE WATER FOR IRRIGATION IR SOLN
Status: DC | PRN
  Administered 2014-08-22: 500 mL

## 2014-08-22 MED ORDER — CEPHALEXIN 500 MG PO CAPS: 500.0000 mg | ORAL_CAPSULE | Freq: Three times a day (TID) | ORAL | Status: DC

## 2014-08-22 SURGICAL SUPPLY — 62 items
ADH SKN CLS APL DERMABOND .7 (GAUZE/BANDAGES/DRESSINGS) ×1
ANTNA NRSTM XTRN TELEM NS LF (UROLOGICAL SUPPLIES) ×1
APL SKNCLS STERI-STRIP NONHPOA (GAUZE/BANDAGES/DRESSINGS) ×2
BAG URINE DRAINAGE (UROLOGICAL SUPPLIES) ×2 IMPLANT
BANDAGE ADHESIVE 1X3 (GAUZE/BANDAGES/DRESSINGS) IMPLANT
BENZOIN TINCTURE PRP APPL 2/3 (GAUZE/BANDAGES/DRESSINGS) ×4 IMPLANT
BLADE SURG 15 STRL LF DISP TIS (BLADE) ×1 IMPLANT
BLADE SURG 15 STRL SS (BLADE) ×2
CABLE TEST STIMULATION (UROLOGICAL SUPPLIES) ×1 IMPLANT
CATH FOLEY 2WAY SLVR  5CC 16FR (CATHETERS) ×1
CATH FOLEY 2WAY SLVR 5CC 16FR (CATHETERS) ×1 IMPLANT
CLOTH BEACON ORANGE TIMEOUT ST (SAFETY) ×1 IMPLANT
COVER MAYO STAND STRL (DRAPES) ×2 IMPLANT
COVER TABLE BACK 60X90 (DRAPES) ×2 IMPLANT
DERMABOND ADVANCED (GAUZE/BANDAGES/DRESSINGS) ×1
DERMABOND ADVANCED .7 DNX12 (GAUZE/BANDAGES/DRESSINGS) ×1 IMPLANT
DRAPE C-ARM 42X72 X-RAY (DRAPES) ×3 IMPLANT
DRAPE INCISE 23X17 IOBAN STRL (DRAPES) ×1
DRAPE INCISE 23X17 STRL (DRAPES) ×1 IMPLANT
DRAPE INCISE IOBAN 23X17 STRL (DRAPES) ×1 IMPLANT
DRAPE LAPAROSCOPIC ABDOMINAL (DRAPES) ×2 IMPLANT
DRAPE LG THREE QUARTER DISP (DRAPES) ×2 IMPLANT
DRESSING TELFA 8X3 (GAUZE/BANDAGES/DRESSINGS) ×2 IMPLANT
DRESSING TELFA ISLAND 4X8 (GAUZE/BANDAGES/DRESSINGS) ×1 IMPLANT
DRSG TEGADERM 2-3/8X2-3/4 SM (GAUZE/BANDAGES/DRESSINGS) ×2 IMPLANT
DRSG TEGADERM 4X4.75 (GAUZE/BANDAGES/DRESSINGS) ×2 IMPLANT
ELECT REM PT RETURN 9FT ADLT (ELECTROSURGICAL) ×2
ELECTRODE REM PT RTRN 9FT ADLT (ELECTROSURGICAL) ×1 IMPLANT
GAUZE SPONGE 4X4 12PLY STRL LF (GAUZE/BANDAGES/DRESSINGS) IMPLANT
GLOVE BIO SURGEON STRL SZ7.5 (GLOVE) ×2 IMPLANT
GLOVE INDICATOR 6.5 STRL GRN (GLOVE) ×2 IMPLANT
GLOVE INDICATOR 7.5 STRL GRN (GLOVE) ×1 IMPLANT
GLOVE SURG SS PI 7.5 STRL IVOR (GLOVE) ×1 IMPLANT
GOWN STRL REIN XL XLG (GOWN DISPOSABLE) ×2 IMPLANT
GOWN STRL REUS W/ TWL XL LVL3 (GOWN DISPOSABLE) IMPLANT
GOWN STRL REUS W/TWL XL LVL3 (GOWN DISPOSABLE) ×2
HOLDER FOLEY CATH W/STRAP (MISCELLANEOUS) ×2 IMPLANT
INTRODUCER GUIDE DILATR SHEATH (SET/KITS/TRAYS/PACK) ×1 IMPLANT
KIT PROBE COVER (MISCELLANEOUS) ×1 IMPLANT
LEAD (Lead) ×2 IMPLANT
NEEDLE FORAMEN 20GA 3.5  9CM (NEEDLE) IMPLANT
NEEDLE FORAMEN 20GA 5  12.5CM (NEEDLE) IMPLANT
NEEDLE HYPO 22GX1.5 SAFETY (NEEDLE) ×2 IMPLANT
PACK BASIN DAY SURGERY FS (CUSTOM PROCEDURE TRAY) ×2 IMPLANT
PENCIL BUTTON HOLSTER BLD 10FT (ELECTRODE) ×2 IMPLANT
PROGRAMMER ANTENNA EXT (UROLOGICAL SUPPLIES) ×1 IMPLANT
PROGRAMMER STIMUL 2.2X1.1X3.7 (UROLOGICAL SUPPLIES) ×1 IMPLANT
STIMULATOR INTERSTIM 2X1.7X.3 (Orthopedic Implant) ×1 IMPLANT
STRIP CLOSURE SKIN 1/2X4 (GAUZE/BANDAGES/DRESSINGS) ×2 IMPLANT
SUCTION FRAZIER TIP 10 FR DISP (SUCTIONS) ×2 IMPLANT
SUT SILK 2 0 (SUTURE) ×2
SUT SILK 2-0 18XBRD TIE 12 (SUTURE) ×1 IMPLANT
SUT VIC AB 3-0 SH 27 (SUTURE) ×4
SUT VIC AB 3-0 SH 27X BRD (SUTURE) ×2 IMPLANT
SUT VICRYL 4-0 PS2 18IN ABS (SUTURE) ×4 IMPLANT
SYR BULB IRRIGATION 50ML (SYRINGE) ×2 IMPLANT
SYR CONTROL 10ML LL (SYRINGE) ×2 IMPLANT
SYRINGE 10CC LL (SYRINGE) ×2 IMPLANT
TOWEL OR 17X24 6PK STRL BLUE (TOWEL DISPOSABLE) ×5 IMPLANT
TRAY DSU PREP LF (CUSTOM PROCEDURE TRAY) ×2 IMPLANT
TUBE CONNECTING 12X1/4 (SUCTIONS) ×2 IMPLANT
WATER STERILE IRR 500ML POUR (IV SOLUTION) ×2 IMPLANT

## 2014-08-22 NOTE — Interval H&P Note (Signed)
History and Physical Interval Note:  08/22/2014 7:22 AM  [name]  has presented today for surgery, with the diagnosis of URGE INCONTINENCE     The various methods of treatment have been discussed with the patient and family. After consideration of risks, benefits and other options for treatment, the patient has consented to  Procedure(s): INTERSTIM IMPLANT STAGE ONE/TWO (N/A) as a surgical intervention .  The patient's history has been reviewed, patient examined, no change in status, stable for surgery.  I have reviewed the patient's chart and labs.  Questions were answered to the patient's satisfaction.     [name] A

## 2014-08-22 NOTE — Op Note (Signed)
Preoperative diagnosis: Refractory urgency incontinence Postoperative diagnosis: Refractory urgency incontinence Surgery: Implantation of InterStim stage I and 2 and impedance check and fluoroscopy Surgeon: Dr. Bjorn Loser  The patient consented the above procedure with the above diagnoses. Patient was placed in the prone position with extra care with positioning and skin preparation.  Utilizing fluoroscopy I marked the S3 foramina on the right. It was easy to place a 3.5 inch foramen through the S3 foramina. I tried 3 different angles the last being the best with excellent motor with bellows and toe response less than 1.5  I placed the guide to the appropriate depth removing the foramen needle. I made a 1 cm incision appropriate depth. I passed the white trocar to the appropriate level fluoroscopically. I removed the inner guide and its inner sheath. I passed a well-prepared lead to the appropriate depth bridging position 3 and 4. The white trocar was pulled back appropriately. She had excellent bellows at position 0123 as well as excellent toe response with less than 1.5  Under fluoroscopic guidance I removed the inner wire and white sheath.   I marked with fluoroscopy and palpation the appropriate level of the generator on the right side. A made a 5 cm incision. I used cautery to go to the appropriate depth. I finger dissected a pocket. A total of 40 cc of lidocaine mixture was used for case.   I delivered the lead medial to lateral with the usual passer. I introduced it into the IPG was screwdriver.  Impedance check was performed. Impedance was normal in all 4 positions.  I closed the lateral incision with running 3-0 Vicryl and 4-0 subcuticular. Medially I closed with interrupted 4-0 Vicryl. Sterile dressing was applied. Hopefully the procedure will reach the patient's treatment goal and was very pleased with the motor responses and fluoroscopic x-rays.

## 2014-08-22 NOTE — Transfer of Care (Signed)
Immediate Anesthesia Transfer of Care Note  Patient: [name]  Procedure(s) Performed: Procedure(s) (LRB): INTERSTIM IMPLANT STAGE ONE/TWO (N/A)  Patient Location: PACU  Anesthesia Type: MAC  Level of Consciousness: awake, alert , oriented and patient cooperative  Airway & Oxygen Therapy: Patient Spontanous Breathing and Patient connected to face mask oxygen  Post-op Assessment: Report given to PACU RN and Post -op Vital signs reviewed and stable  Post vital signs: Reviewed and stable  Complications: No apparent anesthesia complications

## 2014-08-22 NOTE — Anesthesia Postprocedure Evaluation (Signed)
  Anesthesia Post-op Note  Patient: [name]  Procedure(s) Performed: Procedure(s) (LRB): INTERSTIM IMPLANT STAGE ONE/TWO (N/A)  Patient Location: PACU  Anesthesia Type: MAC  Level of Consciousness: awake and alert   Airway and Oxygen Therapy: Patient Spontanous Breathing  Post-op Pain: mild  Post-op Assessment: Post-op Vital signs reviewed, Patient's Cardiovascular Status Stable, Respiratory Function Stable, Patent Airway and No signs of Nausea or vomiting  Last Vitals:  Filed Vitals:   08/22/14 1400  BP:   Pulse: 69  Temp: 36.3 C  Resp: 16    Post-op Vital Signs: stable   Complications: No apparent anesthesia complications

## 2014-08-22 NOTE — Progress Notes (Signed)
DR.Macdiarmid in to speak with the patient and her husband     Rep. With the interstem in with patient and her husband to go over using the machine.

## 2014-08-22 NOTE — Anesthesia Preprocedure Evaluation (Addendum)
Anesthesia Evaluation  Patient identified by MRN, date of birth, ID band Patient awake    Reviewed: Allergy & Precautions, H&P , NPO status , Patient's Chart, lab work & pertinent test results  Airway Mallampati: III  TM Distance: >3 FB Neck ROM: Full    Dental  (+) Caps, Dental Advisory Given 2 upper front are capped:   Pulmonary shortness of breath and with exertion, sleep apnea and Continuous Positive Airway Pressure Ventilation , former smoker,  breath sounds clear to auscultation  Pulmonary exam normal       Cardiovascular hypertension, Pt. on medications +CHF + dysrhythmias Atrial Fibrillation + Valvular Problems/Murmurs AS Rhythm:Irregular Rate:Normal  Chronic diastolic CHF NYHA class1.  Mild to mod. AS.  ECG chronic AF   Neuro/Psych TIAnegative psych ROS   GI/Hepatic negative GI ROS, Neg liver ROS, GERD-  Medicated and Controlled,  Endo/Other  negative endocrine ROS  Renal/GU negative Renal ROS  negative genitourinary   Musculoskeletal  (+) Fibromyalgia -  Abdominal   Peds negative pediatric ROS (+)  Hematology negative hematology ROS (+)   Anesthesia Other Findings   Reproductive/Obstetrics negative OB ROS                            Anesthesia Physical Anesthesia Plan  ASA: III  Anesthesia Plan: MAC   Post-op Pain Management:    Induction:   Airway Management Planned: Simple Face Mask  Additional Equipment:   Intra-op Plan:   Post-operative Plan:   Informed Consent: I have reviewed the patients History and Physical, chart, labs and discussed the procedure including the risks, benefits and alternatives for the proposed anesthesia with the patient or authorized representative who has indicated his/her understanding and acceptance.   Dental Advisory Given  Plan Discussed with: CRNA and Surgeon  Anesthesia Plan Comments:         Anesthesia Quick Evaluation

## 2014-08-22 NOTE — Discharge Instructions (Addendum)
I have reviewed discharge instructions in detail with the patient. They will follow-up with me or their physician as scheduled. My nurse will also be calling the patients as per protocol.  Start Xarelto tomorrow  Post Anesthesia Home Care Instructions  Activity: Get plenty of rest for the remainder of the day. A responsible adult should stay with you for 24 hours following the procedure.  For the next 24 hours, DO NOT: -Drive a car -Paediatric nurse -Drink alcoholic beverages -Take any medication unless instructed by your physician -Make any legal decisions or sign important papers.  Meals: Start with liquid foods such as gelatin or soup. Progress to regular foods as tolerated. Avoid greasy, spicy, heavy foods. If nausea and/or vomiting occur, drink only clear liquids until the nausea and/or vomiting subsides. Call your physician if vomiting continues.  Special Instructions/Symptoms: Your throat may feel dry or sore from the anesthesia or the breathing tube placed in your throat during surgery. If this causes discomfort, gargle with warm salt water. The discomfort should disappear within 24 hours. Call your surgeon if you experience:   1.  Fever over 101.0. 2.  Inability to urinate. 3.  Nausea and/or vomiting. 4.  Extreme swelling or bruising at the surgical site. 5.  Continued bleeding from the incision. 6.  Increased pain, redness or drainage from the incision. 7.  Problems related to your pain medication. 8. Any problems and/or concerns

## 2014-08-23 ENCOUNTER — Encounter (HOSPITAL_BASED_OUTPATIENT_CLINIC_OR_DEPARTMENT_OTHER): Payer: Self-pay | Admitting: Urology

## 2014-08-28 ENCOUNTER — Other Ambulatory Visit: Payer: Self-pay

## 2014-08-28 ENCOUNTER — Other Ambulatory Visit: Payer: Self-pay | Admitting: Cardiology

## 2014-08-28 DIAGNOSIS — M1611 Unilateral primary osteoarthritis, right hip: Secondary | ICD-10-CM | POA: Diagnosis not present

## 2014-08-29 DIAGNOSIS — N3946 Mixed incontinence: Secondary | ICD-10-CM | POA: Diagnosis not present

## 2014-08-29 DIAGNOSIS — R350 Frequency of micturition: Secondary | ICD-10-CM | POA: Diagnosis not present

## 2014-08-30 ENCOUNTER — Other Ambulatory Visit: Payer: Self-pay | Admitting: *Deleted

## 2014-08-30 MED ORDER — DILTIAZEM HCL ER COATED BEADS 180 MG PO CP24: 360.0000 mg | ORAL_CAPSULE | Freq: Every day | ORAL | Status: DC

## 2014-11-22 ENCOUNTER — Telehealth: Payer: Self-pay

## 2014-11-22 NOTE — Telephone Encounter (Signed)
[place] surgical clearance given to [name] for Lovenox bridging instructions.

## 2014-11-25 ENCOUNTER — Telehealth: Payer: Self-pay

## 2014-11-25 NOTE — Telephone Encounter (Signed)
No need for Lovenox bridge with Xarelto.  Would hold Xarelto x 1 dose if surgery is in the PM or 2 doses if surgery is in the AM.

## 2014-11-25 NOTE — Telephone Encounter (Signed)
Printed and placed in medical Records "to be faxed" bin.

## 2014-11-25 NOTE — Telephone Encounter (Signed)
Called to verify Xarelto use with patient before sending surgical clearance for right hip: [facility] procedure schedule April 5. Patient is taking Xarelto 20 mg daily. Per Dr. Radford Pax, forwarding to [facility] for Lovenox bridging instructions prior to procedure.  Med list updated.

## 2014-11-27 DIAGNOSIS — M170 Bilateral primary osteoarthritis of knee: Secondary | ICD-10-CM | POA: Diagnosis not present

## 2014-11-27 DIAGNOSIS — M25551 Pain in right hip: Secondary | ICD-10-CM | POA: Diagnosis not present

## 2014-11-28 ENCOUNTER — Other Ambulatory Visit: Payer: Self-pay | Admitting: Orthopedic Surgery

## 2014-11-28 ENCOUNTER — Telehealth: Payer: Self-pay | Admitting: Physician Assistant

## 2014-11-28 DIAGNOSIS — M25551 Pain in right hip: Secondary | ICD-10-CM

## 2014-11-28 DIAGNOSIS — M170 Bilateral primary osteoarthritis of knee: Secondary | ICD-10-CM

## 2014-11-29 NOTE — Telephone Encounter (Signed)
Close encounter 

## 2014-12-09 ENCOUNTER — Telehealth: Payer: Self-pay | Admitting: Physician Assistant

## 2014-12-10 NOTE — Telephone Encounter (Signed)
Close encounter 

## 2015-01-01 ENCOUNTER — Ambulatory Visit: Payer: PRIVATE HEALTH INSURANCE | Admitting: Cardiology

## 2015-01-10 ENCOUNTER — Telehealth: Payer: Self-pay | Admitting: Cardiology

## 2015-01-10 NOTE — Telephone Encounter (Signed)
Informed patient that surgical clearance was sent 2/8.  If they have not received the clearance they should call our office.  Patient agrees with treatment plan and is thankful for call.

## 2015-01-10 NOTE — Telephone Encounter (Signed)
New Message  Pt had Tarri Fuller appt for 4/1 but was cancelled due to bump list. Pt just returned from 6 weeks of being out of town and just got word of the cancellation. Pt has hip replacement on 4/12. No appts w/ [facility] or [facility] are available. Pt requests to speak w/ Rn. Please call back and discuss.   Request for surgical clearance:  1. What type of surgery is being performed?  Hip replacement  2. When is this surgery scheduled? 4/12  3. Are there any medications that need to be held prior to surgery and how long?  4. Name of physician performing surgery? [facility]  5. What is your office phone and fax number?  6.

## 2015-01-13 ENCOUNTER — Ambulatory Visit: Payer: PRIVATE HEALTH INSURANCE | Admitting: Physician Assistant

## 2015-01-13 DIAGNOSIS — Z01818 Encounter for other preprocedural examination: Secondary | ICD-10-CM | POA: Diagnosis not present

## 2015-01-13 DIAGNOSIS — M1990 Unspecified osteoarthritis, unspecified site: Secondary | ICD-10-CM | POA: Diagnosis not present

## 2015-01-13 DIAGNOSIS — I10 Essential (primary) hypertension: Secondary | ICD-10-CM | POA: Diagnosis not present

## 2015-01-13 DIAGNOSIS — M25559 Pain in unspecified hip: Secondary | ICD-10-CM | POA: Diagnosis not present

## 2015-01-13 DIAGNOSIS — N183 Chronic kidney disease, stage 3 (moderate): Secondary | ICD-10-CM | POA: Diagnosis not present

## 2015-01-15 ENCOUNTER — Other Ambulatory Visit (HOSPITAL_COMMUNITY): Payer: Self-pay | Admitting: *Deleted

## 2015-01-16 ENCOUNTER — Encounter (HOSPITAL_COMMUNITY): Payer: Self-pay

## 2015-01-16 ENCOUNTER — Encounter (HOSPITAL_COMMUNITY)
Admission: RE | Admit: 2015-01-16 | Discharge: 2015-01-16 | Disposition: A | Discharge status: Home / Self Care | Payer: Medicare Other | Source: Ambulatory Visit | Attending: Orthopedic Surgery | Admitting: Orthopedic Surgery

## 2015-01-16 DIAGNOSIS — Z01812 Encounter for preprocedural laboratory examination: Secondary | ICD-10-CM | POA: Insufficient documentation

## 2015-01-16 DIAGNOSIS — M1612 Unilateral primary osteoarthritis, left hip: Secondary | ICD-10-CM | POA: Diagnosis not present

## 2015-01-16 LAB — BASIC METABOLIC PANEL
Anion gap: 10 (ref 5–15)
BUN: 24 mg/dL — AB (ref 6–23)
CO2: 27 mmol/L (ref 19–32)
CREATININE: 1.02 mg/dL (ref 0.50–1.10)
Calcium: 9.9 mg/dL (ref 8.4–10.5)
Chloride: 104 mmol/L (ref 96–112)
GFR, EST AFRICAN AMERICAN: 60 mL/min — AB (ref >90)
GFR, EST NON AFRICAN AMERICAN: 52 mL/min — AB (ref >90)
Glucose, Bld: 97 mg/dL (ref 70–99)
Potassium: 4.3 mmol/L (ref 3.5–5.1)
Sodium: 141 mmol/L (ref 135–145)

## 2015-01-16 LAB — URINALYSIS, ROUTINE W REFLEX MICROSCOPIC
BILIRUBIN URINE: NEGATIVE
GLUCOSE, UA: NEGATIVE mg/dL
HGB URINE DIPSTICK: NEGATIVE
Ketones, ur: NEGATIVE mg/dL
Nitrite: NEGATIVE
PH: 5.5 (ref 5.0–8.0)
Protein, ur: 30 mg/dL — AB
SPECIFIC GRAVITY, URINE: 1.023 (ref 1.005–1.030)
Urobilinogen, UA: 0.2 mg/dL (ref 0.0–1.0)

## 2015-01-16 LAB — URINE MICROSCOPIC-ADD ON

## 2015-01-16 LAB — PROTIME-INR
INR: 2.02 — AB (ref 0.00–1.49)
PROTHROMBIN TIME: 23 s — AB (ref 11.6–15.2)

## 2015-01-16 LAB — CBC
HEMATOCRIT: 44.2 % (ref 36.0–46.0)
Hemoglobin: 14.6 g/dL (ref 12.0–15.0)
MCH: 32.2 pg (ref 26.0–34.0)
MCHC: 33 g/dL (ref 30.0–36.0)
MCV: 97.6 fL (ref 78.0–100.0)
Platelets: 226 10*3/uL (ref 150–400)
RBC: 4.53 MIL/uL (ref 3.87–5.11)
RDW: 13.3 % (ref 11.5–15.5)
WBC: 8 10*3/uL (ref 4.0–10.5)

## 2015-01-16 LAB — APTT: APTT: 47 s — AB (ref 24–37)

## 2015-01-16 LAB — SURGICAL PCR SCREEN
MRSA, PCR: NEGATIVE
STAPHYLOCOCCUS AUREUS: NEGATIVE

## 2015-01-16 NOTE — Patient Instructions (Addendum)
22 [name]  01/16/2015   Your procedure is scheduled on:   01-28-2015 Tuesday  Enter through [facility]  Entrance and follow signs to [place]. Arrive at       [place] AM ..  Call this number if you have problems the morning of surgery: [phone]  Or Presurgical Testing [phone].   For Living Will and/or Health Care Power Attorney Forms: please provide copy for your medical record,may bring AM of surgery(Forms should be already notarized -we do not provide this service).(01-16-15 Yes,information provided with past surgery visit 6'12 and included in medical record Epic.)   For Cpap use: Bring mask and tubing only.   Do not eat food/ or drink: After Midnight.   Take these medicines the morning of surgery with A SIP OF WATER: Tylenol. Lipitor. Omeprazole. Xarelto use as per MD instructions(none in 2 days of surgery).   Do not wear jewelry, make-up or nail polish.  Do not wear deodorant, lotions, powders, or perfumes.   Do not shave legs and under arms- 48 hours(2 days) prior to first CHG shower.(Shaving face and neck okay.)  Do not bring valuables to the hospital.(Hospital is not responsible for lost valuables).  Contacts, dentures or removable bridgework, body piercing, hair pins may not be worn into surgery.  Leave suitcase in the car. After surgery it may be brought to your room.  For patients admitted to the hospital, checkout time is 11:00 AM the day of discharge.(Restricted visitors-Any Persons displaying flu-like symptoms or illness).    Patients discharged the day of surgery will not be allowed to drive home. Must have responsible person with you x 24 hours once discharged.  Name and phone number of your driver: [name] "[name] -spouse [phone] cell     Please read over the following fact sheets that you were given:  CHG(Chlorhexidine Gluconate 4% Surgical Soap) use, MRSA Information, Blood Transfusion fact sheet, Incentive Spirometry  Instruction.  Remember : Type/Screen "Blue armbands" - may not be removed once applied(would result in being retested AM of surgery, if removed).         [facility] - Preparing for Surgery Before surgery, you can play an important role.  Because skin is not sterile, your skin needs to be as free of germs as possible.  You can reduce the number of germs on your skin by washing with CHG (chlorahexidine gluconate) soap before surgery.  CHG is an antiseptic cleaner which kills germs and bonds with the skin to continue killing germs even after washing. Please DO NOT use if you have an allergy to CHG or antibacterial soaps.  If your skin becomes reddened/irritated stop using the CHG and inform your nurse when you arrive at Short Stay. Do not shave (including legs and underarms) for at least 48 hours prior to the first CHG shower.  You may shave your face/neck. Please follow these instructions carefully:  1.  Shower with CHG Soap the night before surgery and the  morning of Surgery.  2.  If you choose to wash your hair, wash your hair first as usual with your  normal  shampoo.  3.  After you shampoo, rinse your hair and body thoroughly to remove the  shampoo.                           4.  Use CHG as you would any other liquid soap.  You can apply chg directly  to the skin and wash                       Gently with a scrungie or clean washcloth.  5.  Apply the CHG Soap to your body ONLY FROM THE NECK DOWN.   Do not use on face/ open                           Wound or open sores. Avoid contact with eyes, ears mouth and genitals (private parts).                       Wash face,  Genitals (private parts) with your normal soap.             6.  Wash thoroughly, paying special attention to the area where your surgery  will be performed.  7.  Thoroughly rinse your body with warm water from the neck down.  8.  DO NOT shower/wash with your normal soap after using and rinsing off  the CHG Soap.                 9.  Pat yourself dry with a clean towel.            10.  Wear clean pajamas.            11.  Place clean sheets on your bed the night of your first shower and do not  sleep with pets. Day of Surgery : Do not apply any lotions/deodorants the morning of surgery.  Please wear clean clothes to the hospital/surgery center.  FAILURE TO FOLLOW THESE INSTRUCTIONS MAY RESULT IN THE CANCELLATION OF YOUR SURGERY PATIENT SIGNATURE_________________________________  NURSE SIGNATURE__________________________________  ________________________________________________________________________   [name]  An incentive spirometer is a tool that can help keep your lungs clear and active. This tool measures how well you are filling your lungs with each breath. Taking long deep breaths may help reverse or decrease the chance of developing breathing (pulmonary) problems (especially infection) following:  A long period of time when you are unable to move or be active. BEFORE THE PROCEDURE   If the spirometer includes an indicator to show your best effort, your nurse or respiratory therapist will set it to a desired goal.  If possible, sit up straight or lean slightly forward. Try not to slouch.  Hold the incentive spirometer in an upright position. INSTRUCTIONS FOR USE   Sit on the edge of your bed if possible, or sit up as far as you can in bed or on a chair.  Hold the incentive spirometer in an upright position.  Breathe out normally.  Place the mouthpiece in your mouth and seal your lips tightly around it.  Breathe in slowly and as deeply as possible, raising the piston or the ball toward the top of the column.  Hold your breath for 3-5 seconds or for as long as possible. Allow the piston or ball to fall to the bottom of the column.  Remove the mouthpiece from your mouth and breathe out normally.  Rest for a few seconds and repeat Steps 1 through 7 at least 10 times every 1-2 hours  when you are awake. Take your time and take a few normal breaths between deep breaths.  The spirometer may include an indicator to show your best effort. Use the indicator as a goal to work toward during each repetition.  After  each set of 10 deep breaths, practice coughing to be sure your lungs are clear. If you have an incision (the cut made at the time of surgery), support your incision when coughing by placing a pillow or rolled up towels firmly against it. Once you are able to get out of bed, walk around indoors and cough well. You may stop using the incentive spirometer when instructed by your caregiver.  RISKS AND COMPLICATIONS  Take your time so you do not get dizzy or light-headed.  If you are in pain, you may need to take or ask for pain medication before doing incentive spirometry. It is harder to take a deep breath if you are having pain. AFTER USE  Rest and breathe slowly and easily.  It can be helpful to keep track of a log of your progress. Your caregiver can provide you with a simple table to help with this. If you are using the spirometer at home, follow these instructions: [place] IF:   You are having difficultly using the spirometer.  You have trouble using the spirometer as often as instructed.  Your pain medication is not giving enough relief while using the spirometer.  You develop fever of 100.5 F (38.1 C) or higher. SEEK IMMEDIATE MEDICAL CARE IF:   You cough up bloody sputum that had not been present before.  You develop fever of 102 F (38.9 C) or greater.  You develop worsening pain at or near the incision site. MAKE SURE YOU:   Understand these instructions.  Will watch your condition.  Will get help right away if you are not doing well or get worse. Document Released: 02/14/2007 Document Revised: 12/27/2011 Document Reviewed: 04/17/2007 ExitCare Patient Information 2014 ExitCare,  [place].   ________________________________________________________________________  WHAT IS A BLOOD TRANSFUSION? Blood Transfusion Information  A transfusion is the replacement of blood or some of its parts. Blood is made up of multiple cells which provide different functions.  Red blood cells carry oxygen and are used for blood loss replacement.  White blood cells fight against infection.  Platelets control bleeding.  Plasma helps clot blood.  Other blood products are available for specialized needs, such as hemophilia or other clotting disorders. BEFORE THE TRANSFUSION  Who gives blood for transfusions?   Healthy volunteers who are fully evaluated to make sure their blood is safe. This is blood bank blood. Transfusion therapy is the safest it has ever been in the practice of medicine. Before blood is taken from a donor, a complete history is taken to make sure that person has no history of diseases nor engages in risky social behavior (examples are intravenous drug use or sexual activity with multiple partners). The donor's travel history is screened to minimize risk of transmitting infections, such as malaria. The donated blood is tested for signs of infectious diseases, such as HIV and hepatitis. The blood is then tested to be sure it is compatible with you in order to minimize the chance of a transfusion reaction. If you or a relative donates blood, this is often done in anticipation of surgery and is not appropriate for emergency situations. It takes many days to process the donated blood. RISKS AND COMPLICATIONS Although transfusion therapy is very safe and saves many lives, the main dangers of transfusion include:   Getting an infectious disease.  Developing a transfusion reaction. This is an allergic reaction to something in the blood you were given. Every precaution is taken to prevent this. The decision to have  a blood transfusion has been considered carefully by your caregiver  before blood is given. Blood is not given unless the benefits outweigh the risks. AFTER THE TRANSFUSION  Right after receiving a blood transfusion, you will usually feel much better and more energetic. This is especially true if your red blood cells have gotten low (anemic). The transfusion raises the level of the red blood cells which carry oxygen, and this usually causes an energy increase.  The nurse administering the transfusion will monitor you carefully for complications. HOME CARE INSTRUCTIONS  No special instructions are needed after a transfusion. You may find your energy is better. Speak with your caregiver about any limitations on activity for underlying diseases you may have. SEEK MEDICAL CARE IF:   Your condition is not improving after your transfusion.  You develop redness or irritation at the intravenous (IV) site. SEEK IMMEDIATE MEDICAL CARE IF:  Any of the following symptoms occur over the next 12 hours:  Shaking chills.  You have a temperature by mouth above 102 F (38.9 C), not controlled by medicine.  Chest, back, or muscle pain.  People around you feel you are not acting correctly or are confused.  Shortness of breath or difficulty breathing.  Dizziness and fainting.  You get a rash or develop hives.  You have a decrease in urine output.  Your urine turns a dark color or changes to pink, red, or brown. Any of the following symptoms occur over the next 10 days:  You have a temperature by mouth above 102 F (38.9 C), not controlled by medicine.  Shortness of breath.  Weakness after normal activity.  The white part of the eye turns yellow (jaundice).  You have a decrease in the amount of urine or are urinating less often.  Your urine turns a dark color or changes to pink, red, or brown. Document Released: 10/01/2000 Document Revised: 12/27/2011 Document Reviewed: 05/20/2008 [facility] Patient Information 2014 [employer],  [place].  _______________________________________________________________________

## 2015-01-16 NOTE — Progress Notes (Signed)
01-16-15 labs viewable in Epic. Note urinalysis and BMP. PT/INR- will be repeated AM of -(Xarelto to be stopped 2 days prior to surgery).

## 2015-01-16 NOTE — Pre-Procedure Instructions (Signed)
01-16-15 EKG 5'15 Epic(hx. A. Fib). Echo 5'15 Epic. LOV 06-27-14 Dr. Radford Pax. 01-16-15 1300 Fax note to Dr. Alvan Dame of urinalysis, BMP, PT/PTT.

## 2015-01-17 ENCOUNTER — Ambulatory Visit: Payer: PRIVATE HEALTH INSURANCE | Admitting: Physician Assistant

## 2015-01-22 ENCOUNTER — Ambulatory Visit: Payer: PRIVATE HEALTH INSURANCE | Admitting: Cardiology

## 2015-01-22 DIAGNOSIS — M25562 Pain in left knee: Secondary | ICD-10-CM | POA: Diagnosis not present

## 2015-01-22 DIAGNOSIS — M1712 Unilateral primary osteoarthritis, left knee: Secondary | ICD-10-CM | POA: Diagnosis not present

## 2015-01-26 NOTE — H&P (Signed)
TOTAL HIP ADMISSION H&P  Patient is admitted for right total hip arthroplasty.  Subjective:  Chief Complaint: right hip pain  HPI: [name], 79 y.o. female, has a history of pain and functional disability in the right hip(s) due to arthritis and patient has failed non-surgical conservative treatments for greater than 12 weeks to include NSAID's and/or analgesics, corticosteriod injections and activity modification.  Onset of symptoms was gradual starting years ago with gradually worsening course since that time.The patient noted prior procedures of the hip to include arthroplasty on the right hip on 07/17/2013 per Dr. Alvan Dame.  Patient currently rates pain in the right hip at 7 out of 10 with activity. Patient has night pain, worsening of pain with activity and weight bearing, trendelenberg gait, pain that interfers with activities of daily living and pain with passive range of motion. Patient has evidence of periarticular osteophytes and joint space narrowing by imaging studies. This condition presents safety issues increasing the risk of falls.  There is no current active infection.  Risks, benefits and expectations were discussed with the patient.  Risks including but not limited to the risk of anesthesia, blood clots, nerve damage, blood vessel damage, failure of the prosthesis, infection and up to and including death.  Patient understand the risks, benefits and expectations and wishes to proceed with surgery.   PCP: Jonathon Bellows, MD  D/C Plans:      Home with HHPT  Post-op Meds:       No Rx given  Tranexamic Acid:      To be given - topically (CAD)  Decadron:      Is to be given  FYI:     Xarelto post-op (on pre-operatively as well)   APAP post-op  Previous surgical components: Left THA, AAutilizing DePuy THR system  7mm pinnacle cup  36+4 neutral Altrex liner  4 Hi Tri Lock stem  36+5 delta ceramic ball   Patient Active Problem List   Diagnosis Date Noted  . Chronic  diastolic CHF (congestive heart failure), NYHA class 1   . OSA (obstructive sleep apnea) 02/25/2014  . Atrial fibrillation, chronic 08/20/2013  . Expected blood loss anemia 07/18/2013  . Obese 07/18/2013  . S/P left THA, AA 07/17/2013  . Hypercholesteremia   . Infected postoperative breast seroma   . Breast implant removal status   . S/P carpal tunnel release   . Cancer   . Shortness of breath   . GERD (gastroesophageal reflux disease)   . Complication of anesthesia   . Hypercholesterolemia   . Menopause   . Breast cancer   . Lower back pain   . TIA (transient ischemic attack)   . Heart murmur   . Aortic stenosis   . Fibromyalgia   . Arthritis   . Osteoarthrosis, unspecified whether generalized or localized, shoulder region 08/11/2012  . hx: breast cancer, left, invasive lobular, multifocal, receptor + her 2 - 11/15/2011  . HTN (hypertension) 11/13/2011  . Retroperitoneal hematoma 03/19/2011   Past Medical History  Diagnosis Date  . Hypertension   . Arthritis   . GERD (gastroesophageal reflux disease)   . Complication of anesthesia     extremly claustophobic  . Aortic stenosis     mild to moderate AS and mild to mod. AR per last echo 02/25/14:  Mod LVH, EF 55-60%, no RWMA (mean 16 mmHg),   . Chronic atrial fibrillation CARDIOLOGIST -- DR Tressia Miners TURNER    controlled  . Chronic diastolic CHF (congestive heart  failure), NYHA class 1   . Fibromyalgia   . History of TIA (transient ischemic attack)     per scan--  no residual  . Hyperlipidemia   . History of breast cancer no recurrence    dx 2004   s/p  left breast mastectomy w/ node dissection/   no chemo or radiation  . Heart murmur     mild to mod AS, mild to mod. AR and MR, mild  TR per echo 02-25-2014  . OSA on CPAP      severe/  AHI 34/hr  . Chronic anticoagulation   . Urge urinary incontinence   . Wears glasses   . Short of breath on exertion     01-16-15 not a problem now.  . Cancer     left breast  mastectomy(cancer)-surgery only    Past Surgical History  Procedure Laterality Date  . Carpal tunnel release Right 07-08-2004  . Appendectomy  1965  . Tubal ligation  1973  . Mastectomy, modified radical w/reconstruction  09-16-2003    left breast W/ SLN DISSECTION  . Total shoulder arthroplasty  08/11/2012    Procedure: TOTAL SHOULDER ARTHROPLASTY;  Surgeon: Augustin Schooling, MD;  Location: [facility];  Service: Orthopedics;  Laterality: Right;  RIGHT  TOTAL SHOULDER  ARTHROPLASTY   . Total hip arthroplasty Left 07/17/2013    Procedure: LEFT TOTAL HIP ARTHROPLASTY ANTERIOR APPROACH;  Surgeon: Mauri Pole, MD;  Location: [facility];  Service: Orthopedics;  Laterality: Left;  . Abdominoplasty  1987  . Revision breast reconstruction Left 11-11-2004    12-03-2004  DRAINAGE SEREMA LEFT CHEST  . Explantation breast implant and debridement  09-15-2005  . Replace tissue expander left breast and total capsulectomy  08-05-2006  . Colonoscopy with esophagogastroduodenoscopy (egd)  2006  . Transthoracic echocardiogram  02-25-2014    moderate LVH/  ef 55-60%/  mod. to sev. calcification AV with mild to moderate AV stenosis/  mild to mod. AR and MR/ mild PR/   moderate to severe LAE  . Interstim implant placement N/A 08/22/2014    Procedure: INTERSTIM IMPLANT STAGE ONE/TWO;  Surgeon: Reece Packer, MD;  Location: [facility];  Service: Urology;  Laterality: N/A;    No prescriptions prior to admission   Allergies  Allergen Reactions  . Beta Adrenergic Blockers Shortness Of Breath  . Betapace [Sotalol Hcl] Shortness Of Breath  . Other     Narcotic pain medications - anxiety attacks  . Oxycodone Shortness Of Breath  . Crestor [Rosuvastatin Calcium] Other (See Comments)    Cough, leg cramps  . Adhesive [Tape] Rash    Adhesive bandaids   . Clonidine Derivatives Palpitations    bradycardia  . Tramadol Anxiety    History  Substance Use Topics  . Smoking status: Former Smoker -- 0.25  packs/day for 10 years    Quit date: 08/09/1985  . Smokeless tobacco: Never Used  . Alcohol Use: No    Family History  Problem Relation Age of Onset  . Heart disease Mother   . Heart disease Father       Review of Systems  Constitutional: Negative.   HENT: Negative.   Eyes: Negative.   Cardiovascular: Negative.   Gastrointestinal: Positive for heartburn.  Genitourinary: Negative.   Musculoskeletal: Positive for joint pain.  Skin: Negative.   Neurological: Negative.   Endo/Heme/Allergies: Negative.   Psychiatric/Behavioral: Negative.     Objective:  Physical Exam  Constitutional: She is oriented to person, place, and time.  She appears well-developed and well-nourished.  HENT:  Head: Normocephalic.  Eyes: Pupils are equal, round, and reactive to light.  Neck: Neck supple. No JVD present. No tracheal deviation present. No thyromegaly present.  Cardiovascular: Normal rate, regular rhythm and intact distal pulses.   Murmur heard. Respiratory: Effort normal and breath sounds normal. No respiratory distress. She has no wheezes.  GI: Soft. There is no tenderness. There is no guarding.  Musculoskeletal:       Right hip: She exhibits decreased range of motion, decreased strength, tenderness and bony tenderness. She exhibits no swelling, no deformity and no laceration.  Lymphadenopathy:    She has no cervical adenopathy.  Neurological: She is alert and oriented to person, place, and time.  Skin: Skin is warm and dry.  Psychiatric: She has a normal mood and affect.      Labs:  Estimated body mass index is 28.11 kg/(m^2) as calculated from the following:   Height as of 08/22/14: 5\' 7"  (1.702 m).   Weight as of 08/22/14: 81.421 kg (179 lb 8 oz).   Imaging Review Plain radiographs demonstrate severe degenerative joint disease of the right hip(s). The bone quality appears to be good for age and reported activity level.  Assessment/Plan:  End stage arthritis, right  hip(s)  The patient history, physical examination, clinical judgement of the provider and imaging studies are consistent with end stage degenerative joint disease of the right hip(s) and total hip arthroplasty is deemed medically necessary. The treatment options including medical management, injection therapy, arthroscopy and arthroplasty were discussed at length. The risks and benefits of total hip arthroplasty were presented and reviewed. The risks due to aseptic loosening, infection, stiffness, dislocation/subluxation,  thromboembolic complications and other imponderables were discussed.  The patient acknowledged the explanation, agreed to proceed with the plan and consent was signed. Patient is being admitted for inpatient treatment for surgery, pain control, PT, OT, prophylactic antibiotics, VTE prophylaxis, progressive ambulation and ADL's and discharge planning.The patient is planning to be discharged home with home health services.     [place] Pugh Owens Hara   PA-C  01/26/2015, 12:18 PM

## 2015-01-28 ENCOUNTER — Inpatient Hospital Stay (HOSPITAL_COMMUNITY)
Admission: RE | Admit: 2015-01-28 | Discharge: 2015-01-29 | DRG: 470 | Disposition: A | Discharge status: Home Health | Payer: Medicare Other | Source: Ambulatory Visit | Attending: Orthopedic Surgery | Admitting: Orthopedic Surgery

## 2015-01-28 ENCOUNTER — Inpatient Hospital Stay (HOSPITAL_COMMUNITY): Payer: Medicare Other

## 2015-01-28 ENCOUNTER — Inpatient Hospital Stay (HOSPITAL_COMMUNITY): Payer: Medicare Other | Admitting: Anesthesiology

## 2015-01-28 ENCOUNTER — Encounter (HOSPITAL_COMMUNITY): Payer: Self-pay | Admitting: *Deleted

## 2015-01-28 ENCOUNTER — Encounter (HOSPITAL_COMMUNITY)
Admission: RE | Disposition: A | Discharge status: Home Health | Payer: Self-pay | Source: Ambulatory Visit | Attending: Orthopedic Surgery

## 2015-01-28 DIAGNOSIS — M1611 Unilateral primary osteoarthritis, right hip: Principal | ICD-10-CM | POA: Diagnosis present

## 2015-01-28 DIAGNOSIS — M797 Fibromyalgia: Secondary | ICD-10-CM | POA: Diagnosis not present

## 2015-01-28 DIAGNOSIS — I482 Chronic atrial fibrillation: Secondary | ICD-10-CM | POA: Diagnosis not present

## 2015-01-28 DIAGNOSIS — E780 Pure hypercholesterolemia: Secondary | ICD-10-CM | POA: Diagnosis present

## 2015-01-28 DIAGNOSIS — Z96642 Presence of left artificial hip joint: Secondary | ICD-10-CM | POA: Diagnosis present

## 2015-01-28 DIAGNOSIS — E669 Obesity, unspecified: Secondary | ICD-10-CM | POA: Diagnosis present

## 2015-01-28 DIAGNOSIS — I10 Essential (primary) hypertension: Secondary | ICD-10-CM | POA: Diagnosis present

## 2015-01-28 DIAGNOSIS — R011 Cardiac murmur, unspecified: Secondary | ICD-10-CM | POA: Diagnosis present

## 2015-01-28 DIAGNOSIS — Z885 Allergy status to narcotic agent status: Secondary | ICD-10-CM | POA: Diagnosis not present

## 2015-01-28 DIAGNOSIS — Z96641 Presence of right artificial hip joint: Secondary | ICD-10-CM | POA: Diagnosis not present

## 2015-01-28 DIAGNOSIS — E785 Hyperlipidemia, unspecified: Secondary | ICD-10-CM | POA: Diagnosis present

## 2015-01-28 DIAGNOSIS — K219 Gastro-esophageal reflux disease without esophagitis: Secondary | ICD-10-CM | POA: Diagnosis not present

## 2015-01-28 DIAGNOSIS — E663 Overweight: Secondary | ICD-10-CM | POA: Diagnosis present

## 2015-01-28 DIAGNOSIS — M169 Osteoarthritis of hip, unspecified: Secondary | ICD-10-CM | POA: Diagnosis not present

## 2015-01-28 DIAGNOSIS — Z8673 Personal history of transient ischemic attack (TIA), and cerebral infarction without residual deficits: Secondary | ICD-10-CM | POA: Diagnosis not present

## 2015-01-28 DIAGNOSIS — Z471 Aftercare following joint replacement surgery: Secondary | ICD-10-CM | POA: Diagnosis not present

## 2015-01-28 DIAGNOSIS — Z6827 Body mass index (BMI) 27.0-27.9, adult: Secondary | ICD-10-CM

## 2015-01-28 DIAGNOSIS — Z7901 Long term (current) use of anticoagulants: Secondary | ICD-10-CM

## 2015-01-28 DIAGNOSIS — Z96611 Presence of right artificial shoulder joint: Secondary | ICD-10-CM | POA: Diagnosis present

## 2015-01-28 DIAGNOSIS — I5032 Chronic diastolic (congestive) heart failure: Secondary | ICD-10-CM | POA: Diagnosis present

## 2015-01-28 DIAGNOSIS — Z91048 Other nonmedicinal substance allergy status: Secondary | ICD-10-CM

## 2015-01-28 DIAGNOSIS — Z853 Personal history of malignant neoplasm of breast: Secondary | ICD-10-CM

## 2015-01-28 DIAGNOSIS — G4733 Obstructive sleep apnea (adult) (pediatric): Secondary | ICD-10-CM | POA: Diagnosis not present

## 2015-01-28 DIAGNOSIS — Z87891 Personal history of nicotine dependence: Secondary | ICD-10-CM

## 2015-01-28 DIAGNOSIS — I350 Nonrheumatic aortic (valve) stenosis: Secondary | ICD-10-CM | POA: Diagnosis present

## 2015-01-28 DIAGNOSIS — Z888 Allergy status to other drugs, medicaments and biological substances status: Secondary | ICD-10-CM

## 2015-01-28 DIAGNOSIS — Z96649 Presence of unspecified artificial hip joint: Secondary | ICD-10-CM

## 2015-01-28 HISTORY — PX: TOTAL HIP ARTHROPLASTY: SHX124

## 2015-01-28 LAB — TYPE AND SCREEN
ABO/RH(D): B POS
Antibody Screen: NEGATIVE

## 2015-01-28 LAB — PROTIME-INR
INR: 1.07 (ref 0.00–1.49)
PROTHROMBIN TIME: 14 s (ref 11.6–15.2)

## 2015-01-28 SURGERY — ARTHROPLASTY, HIP, TOTAL, ANTERIOR APPROACH
Anesthesia: General | Site: Hip | Laterality: Right

## 2015-01-28 MED ORDER — RIVAROXABAN 10 MG PO TABS
10.0000 mg | ORAL_TABLET | ORAL | Status: DC
  Administered 2015-01-29: 10 mg via ORAL
  Filled 2015-01-28 (×2): qty 1

## 2015-01-28 MED ORDER — DOCUSATE SODIUM 100 MG PO CAPS
100.0000 mg | ORAL_CAPSULE | Freq: Two times a day (BID) | ORAL | Status: DC
  Administered 2015-01-28 – 2015-01-29 (×2): 100 mg via ORAL

## 2015-01-28 MED ORDER — HYDROMORPHONE HCL 1 MG/ML IJ SOLN
INTRAMUSCULAR | Status: DC | PRN
  Administered 2015-01-28: 1 mg via INTRAVENOUS
  Administered 2015-01-28 (×2): 0.5 mg via INTRAVENOUS

## 2015-01-28 MED ORDER — GLYCOPYRROLATE 0.2 MG/ML IJ SOLN
INTRAMUSCULAR | Status: DC | PRN
  Administered 2015-01-28: 0.2 mg via INTRAVENOUS
  Administered 2015-01-28: 0.6 mg via INTRAVENOUS

## 2015-01-28 MED ORDER — ATORVASTATIN CALCIUM 40 MG PO TABS
40.0000 mg | ORAL_TABLET | Freq: Every day | ORAL | Status: DC
  Filled 2015-01-28: qty 1

## 2015-01-28 MED ORDER — NEOSTIGMINE METHYLSULFATE 10 MG/10ML IV SOLN
INTRAVENOUS | Status: AC
  Filled 2015-01-28: qty 1

## 2015-01-28 MED ORDER — PHENOL 1.4 % MT LIQD: 1.0000 | OROMUCOSAL | Status: DC | PRN

## 2015-01-28 MED ORDER — CEFAZOLIN SODIUM-DEXTROSE 2-3 GM-% IV SOLR
INTRAVENOUS | Status: AC
  Filled 2015-01-28: qty 50

## 2015-01-28 MED ORDER — GLYCOPYRROLATE 0.2 MG/ML IJ SOLN
INTRAMUSCULAR | Status: AC
  Filled 2015-01-28: qty 4

## 2015-01-28 MED ORDER — TRANEXAMIC ACID 100 MG/ML IV SOLN
2000.0000 mg | INTRAVENOUS | Status: DC | PRN
  Administered 2015-01-28: 2000 mg via TOPICAL

## 2015-01-28 MED ORDER — HYDROMORPHONE HCL 1 MG/ML IJ SOLN
INTRAMUSCULAR | Status: AC
  Filled 2015-01-28: qty 1

## 2015-01-28 MED ORDER — CEFAZOLIN SODIUM-DEXTROSE 2-3 GM-% IV SOLR
2.0000 g | Freq: Four times a day (QID) | INTRAVENOUS | Status: AC
  Administered 2015-01-28 (×2): 2 g via INTRAVENOUS
  Filled 2015-01-28 (×2): qty 50

## 2015-01-28 MED ORDER — ALUM & MAG HYDROXIDE-SIMETH 200-200-20 MG/5ML PO SUSP: 30.0000 mL | ORAL | Status: DC | PRN

## 2015-01-28 MED ORDER — CHLORHEXIDINE GLUCONATE 4 % EX LIQD: 60.0000 mL | Freq: Once | CUTANEOUS | Status: DC

## 2015-01-28 MED ORDER — SODIUM CHLORIDE 0.9 % IR SOLN
Status: DC | PRN
  Administered 2015-01-28: 1000 mL

## 2015-01-28 MED ORDER — DEXAMETHASONE SODIUM PHOSPHATE 10 MG/ML IJ SOLN
10.0000 mg | Freq: Once | INTRAMUSCULAR | Status: AC
  Administered 2015-01-28: 10 mg via INTRAVENOUS

## 2015-01-28 MED ORDER — PHENYLEPHRINE 40 MCG/ML (10ML) SYRINGE FOR IV PUSH (FOR BLOOD PRESSURE SUPPORT)
PREFILLED_SYRINGE | INTRAVENOUS | Status: AC
  Filled 2015-01-28: qty 10

## 2015-01-28 MED ORDER — ROCURONIUM BROMIDE 100 MG/10ML IV SOLN
INTRAVENOUS | Status: AC
Start: 2015-01-28 — End: 2015-01-28
  Filled 2015-01-28: qty 1

## 2015-01-28 MED ORDER — LIDOCAINE HCL (CARDIAC) 20 MG/ML IV SOLN
INTRAVENOUS | Status: AC
  Filled 2015-01-28: qty 5

## 2015-01-28 MED ORDER — MENTHOL 3 MG MT LOZG: 1.0000 | LOZENGE | OROMUCOSAL | Status: DC | PRN

## 2015-01-28 MED ORDER — RIVAROXABAN 20 MG PO TABS: 20.0000 mg | ORAL_TABLET | Freq: Every day | ORAL | Status: DC

## 2015-01-28 MED ORDER — BISACODYL 10 MG RE SUPP: 10.0000 mg | Freq: Every day | RECTAL | Status: DC | PRN

## 2015-01-28 MED ORDER — METHOCARBAMOL 500 MG PO TABS
500.0000 mg | ORAL_TABLET | Freq: Four times a day (QID) | ORAL | Status: DC | PRN
  Administered 2015-01-29: 500 mg via ORAL
  Filled 2015-01-28: qty 1

## 2015-01-28 MED ORDER — FERROUS SULFATE 325 (65 FE) MG PO TABS
325.0000 mg | ORAL_TABLET | Freq: Three times a day (TID) | ORAL | Status: DC
  Administered 2015-01-28 – 2015-01-29 (×2): 325 mg via ORAL
  Filled 2015-01-28 (×5): qty 1

## 2015-01-28 MED ORDER — CELECOXIB 200 MG PO CAPS
200.0000 mg | ORAL_CAPSULE | Freq: Two times a day (BID) | ORAL | Status: DC
Start: 2015-01-28 — End: 2015-01-29
  Filled 2015-01-28 (×3): qty 1

## 2015-01-28 MED ORDER — ONDANSETRON HCL 4 MG PO TABS: 4.0000 mg | ORAL_TABLET | Freq: Four times a day (QID) | ORAL | Status: DC | PRN

## 2015-01-28 MED ORDER — ZOLPIDEM TARTRATE 5 MG PO TABS
5.0000 mg | ORAL_TABLET | Freq: Every evening | ORAL | Status: DC | PRN
  Administered 2015-01-28: 5 mg via ORAL
  Filled 2015-01-28: qty 1

## 2015-01-28 MED ORDER — MAGNESIUM CITRATE PO SOLN
1.0000 | Freq: Once | ORAL | Status: AC | PRN
Start: 2015-01-28 — End: 2015-01-28

## 2015-01-28 MED ORDER — FENTANYL CITRATE 0.05 MG/ML IJ SOLN
INTRAMUSCULAR | Status: AC
  Filled 2015-01-28: qty 5

## 2015-01-28 MED ORDER — ROCURONIUM BROMIDE 100 MG/10ML IV SOLN
INTRAVENOUS | Status: DC | PRN
  Administered 2015-01-28: 30 mg via INTRAVENOUS

## 2015-01-28 MED ORDER — ONDANSETRON HCL 4 MG/2ML IJ SOLN
INTRAMUSCULAR | Status: AC
  Filled 2015-01-28: qty 2

## 2015-01-28 MED ORDER — PROPOFOL 10 MG/ML IV BOLUS
INTRAVENOUS | Status: DC | PRN
Start: 2015-01-28 — End: 2015-01-28
  Administered 2015-01-28: 150 mg via INTRAVENOUS

## 2015-01-28 MED ORDER — DEXAMETHASONE SODIUM PHOSPHATE 10 MG/ML IJ SOLN
10.0000 mg | Freq: Once | INTRAMUSCULAR | Status: AC
  Administered 2015-01-29: 10 mg via INTRAVENOUS
  Filled 2015-01-28: qty 1

## 2015-01-28 MED ORDER — METOCLOPRAMIDE HCL 10 MG PO TABS: 5.0000 mg | ORAL_TABLET | Freq: Three times a day (TID) | ORAL | Status: DC | PRN

## 2015-01-28 MED ORDER — ACETAMINOPHEN 325 MG PO TABS
325.0000 mg | ORAL_TABLET | ORAL | Status: DC | PRN
  Administered 2015-01-29 (×3): 650 mg via ORAL
  Filled 2015-01-28 (×4): qty 2

## 2015-01-28 MED ORDER — METHOCARBAMOL 1000 MG/10ML IJ SOLN
500.0000 mg | Freq: Four times a day (QID) | INTRAVENOUS | Status: DC | PRN
  Administered 2015-01-28: 500 mg via INTRAVENOUS
  Filled 2015-01-28 (×2): qty 5

## 2015-01-28 MED ORDER — PROMETHAZINE HCL 25 MG/ML IJ SOLN: 6.2500 mg | INTRAMUSCULAR | Status: DC | PRN

## 2015-01-28 MED ORDER — ONDANSETRON HCL 4 MG/2ML IJ SOLN
INTRAMUSCULAR | Status: DC | PRN
  Administered 2015-01-28: 4 mg via INTRAVENOUS

## 2015-01-28 MED ORDER — DILTIAZEM HCL ER BEADS 240 MG PO CP24
360.0000 mg | ORAL_CAPSULE | Freq: Every evening | ORAL | Status: DC
  Administered 2015-01-28: 360 mg via ORAL
  Filled 2015-01-28 (×2): qty 1

## 2015-01-28 MED ORDER — NITROGLYCERIN 0.4 MG SL SUBL: 0.4000 mg | SUBLINGUAL_TABLET | SUBLINGUAL | Status: DC | PRN

## 2015-01-28 MED ORDER — LIDOCAINE HCL (CARDIAC) 20 MG/ML IV SOLN
INTRAVENOUS | Status: DC | PRN
  Administered 2015-01-28: 50 mg via INTRAVENOUS

## 2015-01-28 MED ORDER — CEFAZOLIN SODIUM-DEXTROSE 2-3 GM-% IV SOLR
2.0000 g | INTRAVENOUS | Status: AC
  Administered 2015-01-28: 2 g via INTRAVENOUS

## 2015-01-28 MED ORDER — NON FORMULARY: 20.0000 mg | Freq: Every day | Status: DC

## 2015-01-28 MED ORDER — HYDROMORPHONE HCL 1 MG/ML IJ SOLN
0.5000 mg | INTRAMUSCULAR | Status: DC | PRN
  Administered 2015-01-28: 1 mg via INTRAVENOUS
  Filled 2015-01-28: qty 1

## 2015-01-28 MED ORDER — DEXAMETHASONE SODIUM PHOSPHATE 10 MG/ML IJ SOLN
INTRAMUSCULAR | Status: AC
  Filled 2015-01-28: qty 1

## 2015-01-28 MED ORDER — PHENYLEPHRINE HCL 10 MG/ML IJ SOLN
INTRAMUSCULAR | Status: DC | PRN
  Administered 2015-01-28 (×2): 40 ug via INTRAVENOUS

## 2015-01-28 MED ORDER — SODIUM CHLORIDE 0.9 % IV SOLN
2000.0000 mg | Freq: Once | INTRAVENOUS | Status: DC
  Filled 2015-01-28: qty 20

## 2015-01-28 MED ORDER — IRBESARTAN 75 MG PO TABS
75.0000 mg | ORAL_TABLET | Freq: Every day | ORAL | Status: DC
  Administered 2015-01-28 – 2015-01-29 (×2): 75 mg via ORAL
  Filled 2015-01-28 (×2): qty 1

## 2015-01-28 MED ORDER — ONDANSETRON HCL 4 MG/2ML IJ SOLN: 4.0000 mg | Freq: Four times a day (QID) | INTRAMUSCULAR | Status: DC | PRN

## 2015-01-28 MED ORDER — PROPOFOL 10 MG/ML IV BOLUS
INTRAVENOUS | Status: AC
  Filled 2015-01-28: qty 20

## 2015-01-28 MED ORDER — SODIUM CHLORIDE 0.9 % IV SOLN
100.0000 mL/h | INTRAVENOUS | Status: DC
  Administered 2015-01-28 – 2015-01-29 (×2): 100 mL/h via INTRAVENOUS
  Filled 2015-01-28 (×7): qty 1000

## 2015-01-28 MED ORDER — FUROSEMIDE 20 MG PO TABS
20.0000 mg | ORAL_TABLET | Freq: Every day | ORAL | Status: DC
  Administered 2015-01-28 – 2015-01-29 (×2): 20 mg via ORAL
  Filled 2015-01-28 (×2): qty 1

## 2015-01-28 MED ORDER — OMEPRAZOLE 20 MG PO CPDR
20.0000 mg | DELAYED_RELEASE_CAPSULE | Freq: Every day | ORAL | Status: DC
  Administered 2015-01-29: 20 mg via ORAL
  Filled 2015-01-28 (×2): qty 1

## 2015-01-28 MED ORDER — LACTATED RINGERS IV SOLN
INTRAVENOUS | Status: DC
  Administered 2015-01-28: 1000 mL via INTRAVENOUS

## 2015-01-28 MED ORDER — FENTANYL CITRATE 0.05 MG/ML IJ SOLN
INTRAMUSCULAR | Status: DC | PRN
  Administered 2015-01-28: 100 ug via INTRAVENOUS
  Administered 2015-01-28 (×3): 50 ug via INTRAVENOUS

## 2015-01-28 MED ORDER — ACETAMINOPHEN 325 MG PO TABS
325.0000 mg | ORAL_TABLET | Freq: Four times a day (QID) | ORAL | Status: DC | PRN
  Administered 2015-01-28: 650 mg via ORAL
  Filled 2015-01-28: qty 2

## 2015-01-28 MED ORDER — HYDROMORPHONE HCL 1 MG/ML IJ SOLN
0.2500 mg | INTRAMUSCULAR | Status: DC | PRN
  Administered 2015-01-28: 0.25 mg via INTRAVENOUS

## 2015-01-28 MED ORDER — DIPHENHYDRAMINE HCL 25 MG PO CAPS: 25.0000 mg | ORAL_CAPSULE | Freq: Four times a day (QID) | ORAL | Status: DC | PRN

## 2015-01-28 MED ORDER — POLYETHYLENE GLYCOL 3350 17 G PO PACK
17.0000 g | PACK | Freq: Two times a day (BID) | ORAL | Status: DC
  Administered 2015-01-28 – 2015-01-29 (×3): 17 g via ORAL

## 2015-01-28 MED ORDER — NEOSTIGMINE METHYLSULFATE 10 MG/10ML IV SOLN
INTRAVENOUS | Status: DC | PRN
  Administered 2015-01-28: 5 mg via INTRAVENOUS

## 2015-01-28 MED ORDER — METOCLOPRAMIDE HCL 5 MG/ML IJ SOLN: 5.0000 mg | Freq: Three times a day (TID) | INTRAMUSCULAR | Status: DC | PRN

## 2015-01-28 MED ORDER — HYDROMORPHONE HCL 2 MG/ML IJ SOLN
INTRAMUSCULAR | Status: AC
  Filled 2015-01-28: qty 1

## 2015-01-28 SURGICAL SUPPLY — 47 items
ADH SKN CLS APL DERMABOND .7 (GAUZE/BANDAGES/DRESSINGS) ×1
BAG DECANTER FOR FLEXI CONT (MISCELLANEOUS) ×1 IMPLANT
BAG SPEC THK2 15X12 ZIP CLS (MISCELLANEOUS)
BAG ZIPLOCK 12X15 (MISCELLANEOUS) IMPLANT
CAPT HIP TOTAL 2 ×1 IMPLANT
COVER PERINEAL POST (MISCELLANEOUS) ×2 IMPLANT
DERMABOND ADVANCED (GAUZE/BANDAGES/DRESSINGS) ×1
DERMABOND ADVANCED .7 DNX12 (GAUZE/BANDAGES/DRESSINGS) IMPLANT
DRAPE C-ARM 42X120 X-RAY (DRAPES) ×2 IMPLANT
DRAPE STERI IOBAN 125X83 (DRAPES) ×2 IMPLANT
DRAPE U-SHAPE 47X51 STRL (DRAPES) ×6 IMPLANT
DRSG AQUACEL AG ADV 3.5X10 (GAUZE/BANDAGES/DRESSINGS) ×2 IMPLANT
DURAPREP 26ML APPLICATOR (WOUND CARE) ×2 IMPLANT
ELECT BLADE TIP CTD 4 INCH (ELECTRODE) ×2 IMPLANT
ELECT PENCIL ROCKER SW 15FT (MISCELLANEOUS) IMPLANT
ELECT REM PT RETURN 15FT ADLT (MISCELLANEOUS) IMPLANT
ELECT REM PT RETURN 9FT ADLT (ELECTROSURGICAL) ×2
ELECTRODE REM PT RTRN 9FT ADLT (ELECTROSURGICAL) ×1 IMPLANT
FACESHIELD WRAPAROUND (MASK) ×8 IMPLANT
FACESHIELD WRAPAROUND OR TEAM (MASK) ×4 IMPLANT
GLOVE BIOGEL PI IND STRL 7.5 (GLOVE) ×1 IMPLANT
GLOVE BIOGEL PI IND STRL 8.5 (GLOVE) ×1 IMPLANT
GLOVE BIOGEL PI INDICATOR 7.5 (GLOVE) ×1
GLOVE BIOGEL PI INDICATOR 8.5 (GLOVE) ×1
GLOVE ECLIPSE 8.0 STRL XLNG CF (GLOVE) ×4 IMPLANT
GLOVE ORTHO TXT STRL SZ7.5 (GLOVE) ×2 IMPLANT
GOWN SPEC L3 XXLG W/TWL (GOWN DISPOSABLE) ×2 IMPLANT
GOWN STRL REUS W/TWL LRG LVL3 (GOWN DISPOSABLE) ×2 IMPLANT
HOLDER FOLEY CATH W/STRAP (MISCELLANEOUS) ×2 IMPLANT
KIT BASIN OR (CUSTOM PROCEDURE TRAY) ×2 IMPLANT
LIQUID BAND (GAUZE/BANDAGES/DRESSINGS) ×2 IMPLANT
NDL SAFETY ECLIPSE 18X1.5 (NEEDLE) IMPLANT
NEEDLE HYPO 18GX1.5 SHARP (NEEDLE) ×2
PACK TOTAL JOINT (CUSTOM PROCEDURE TRAY) ×2 IMPLANT
PEN SKIN MARKING BROAD (MISCELLANEOUS) ×2 IMPLANT
SAW OSC TIP CART 19.5X105X1.3 (SAW) ×2 IMPLANT
SUT MNCRL AB 4-0 PS2 18 (SUTURE) ×2 IMPLANT
SUT VIC AB 1 CT1 36 (SUTURE) ×6 IMPLANT
SUT VIC AB 2-0 CT1 27 (SUTURE) ×4
SUT VIC AB 2-0 CT1 TAPERPNT 27 (SUTURE) ×2 IMPLANT
SUT VLOC 180 0 24IN GS25 (SUTURE) ×2 IMPLANT
SYR 50ML LL SCALE MARK (SYRINGE) ×1 IMPLANT
TOWEL OR 17X26 10 PK STRL BLUE (TOWEL DISPOSABLE) ×2 IMPLANT
TOWEL OR NON WOVEN STRL DISP B (DISPOSABLE) IMPLANT
TRAY FOLEY CATH 14FRSI W/METER (CATHETERS) ×2 IMPLANT
WATER STERILE IRR 1500ML POUR (IV SOLUTION) ×2 IMPLANT
YANKAUER SUCT BULB TIP 10FT TU (MISCELLANEOUS) ×2 IMPLANT

## 2015-01-28 NOTE — Progress Notes (Signed)
Utilization review completed.  

## 2015-01-28 NOTE — Anesthesia Postprocedure Evaluation (Signed)
  Anesthesia Post-op Note  Patient: [name]  Procedure(s) Performed: Procedure(s) (LRB): RIGHT TOTAL HIP ARTHROPLASTY ANTERIOR APPROACH (Right)  Patient Location: PACU  Anesthesia Type: General  Level of Consciousness: awake and alert   Airway and Oxygen Therapy: Patient Spontanous Breathing  Post-op Pain: mild  Post-op Assessment: Post-op Vital signs reviewed, Patient's Cardiovascular Status Stable, Respiratory Function Stable, Patent Airway and No signs of Nausea or vomiting  Last Vitals:  Filed Vitals:   01/28/15 1230  BP: 129/53  Pulse: 54  Temp:   Resp: 18    Post-op Vital Signs: stable   Complications: No apparent anesthesia complications

## 2015-01-28 NOTE — Transfer of Care (Signed)
Immediate Anesthesia Transfer of Care Note  Patient: [name]  Procedure(s) Performed: Procedure(s) (LRB): RIGHT TOTAL HIP ARTHROPLASTY ANTERIOR APPROACH (Right)  Patient Location: PACU  Anesthesia Type: General  Level of Consciousness: sedated, patient cooperative and responds to stimulation  Airway & Oxygen Therapy: Patient Spontanous Breathing and Patient connected to face mask oxgen  Post-op Assessment: Report given to PACU RN and Post -op Vital signs reviewed and stable  Post vital signs: Reviewed and stable  Complications: No apparent anesthesia complications

## 2015-01-28 NOTE — Op Note (Signed)
NAME:  [name]                ACCOUNT NO.: [id number]      MEDICAL RECORD NO.: [id number]      FACILITY:  [facility]      PHYSICIAN:  Paralee Cancel D  DATE OF BIRTH:  06-10-1936     DATE OF PROCEDURE:  01/28/2015                                 OPERATIVE REPORT         PREOPERATIVE DIAGNOSIS: Right  hip osteoarthritis.      POSTOPERATIVE DIAGNOSIS:  Right hip osteoarthritis.  History of left THR     PROCEDURE:  Right total hip replacement through an anterior approach   utilizing DePuy THR system, component size 50mm pinnacle cup, a size 36+4 neutral   Altrex liner, a size 5 Hi Tri Lock stem with a 36+1.5 delta ceramic   ball.      SURGEON:  Pietro Cassis. Alvan Dame, M.D.      ASSISTANT:  Danae Orleans, PA-C      ANESTHESIA:  Spinal.      SPECIMENS:  None.      COMPLICATIONS:  None.      BLOOD LOSS:  300 cc     DRAINS:  None.      INDICATION OF THE PROCEDURE:  [name] is a 78 y.o. female who had   presented to office for evaluation of right hip pain.  Radiographs revealed   progressive degenerative changes with bone-on-bone   articulation to the  hip joint.  The patient had painful limited range of   motion significantly affecting their overall quality of life.  The patient was failing to    respond to conservative measures, and at this point was ready   to proceed with more definitive measures.  The patient has noted progressive   degenerative changes in his hip, progressive problems and dysfunction   with regarding the hip prior to surgery.  Consent was obtained for   benefit of pain relief.  Specific risk of infection, DVT, component   failure, dislocation, need for revision surgery, as well discussion of   the anterior versus posterior approach were reviewed.  Consent was   obtained for benefit of anterior pain relief through an anterior   approach.      PROCEDURE IN DETAIL:  The patient was brought to operative theater.   Once  adequate anesthesia, preoperative antibiotics, 2gm of Ancef, 10mg  of Decadron administered.   The patient was positioned supine on the OSI Hanna table.  Once adequate   padding of boney process was carried out, we had predraped out the hip, and  used fluoroscopy to confirm orientation of the pelvis and position.      The right hip was then prepped and draped from proximal iliac crest to   mid thigh with shower curtain technique.      Time-out was performed identifying the patient, planned procedure, and   extremity.     An incision was then made 2 cm distal and lateral to the   anterior superior iliac spine extending over the orientation of the   tensor fascia lata muscle and sharp dissection was carried down to the   fascia of the muscle and protractor placed in the soft tissues.      The fascia was then incised.  The muscle belly was identified and swept   laterally and retractor placed along the superior neck.  Following   cauterization of the circumflex vessels and removing some pericapsular   fat, a second cobra retractor was placed on the inferior neck.  A third   retractor was placed on the anterior acetabulum after elevating the   anterior rectus.  A L-capsulotomy was along the line of the   superior neck to the trochanteric fossa, then extended proximally and   distally.  Tag sutures were placed and the retractors were then placed   intracapsular.  We then identified the trochanteric fossa and   orientation of my neck cut, confirmed this radiographically   and then made a neck osteotomy with the femur on traction.  The femoral   head was removed without difficulty or complication.  Traction was let   off and retractors were placed posterior and anterior around the   acetabulum.      The labrum and foveal tissue were debrided.  I began reaming with a 61mm   reamer and reamed up to 68mm reamer with good bony bed preparation and a 57mm   cup was chosen.  The final 47mm Pinnacle  cup was then impacted under fluoroscopy  to confirm the depth of penetration and orientation with respect to   abduction.  A screw was placed followed by the hole eliminator.  The final   36+4 neutral Altrex liner was impacted with good visualized rim fit.  The cup was positioned anatomically within the acetabular portion of the pelvis.      At this point, the femur was rolled at 80 degrees.  Further capsule was   released off the inferior aspect of the femoral neck.  I then   released the superior capsule proximally.  The hook was placed laterally   along the femur and elevated manually and held in position with the bed   hook.  The leg was then extended and adducted with the leg rolled to 100   degrees of external rotation.  Once the proximal femur was fully   exposed, I used a box osteotome to set orientation.  I then began   broaching with the starting chili pepper broach and passed this by hand and then broached up to 5.  With the 5 broach in place I chose a high offset neck and did a trial reduction.  The offset was appropriate, leg lengths   appeared to be equal, confirmed radiographically.   Given these findings, I went ahead and dislocated the hip, repositioned all   retractors and positioned the right hip in the extended and abducted position.  The final 5 Hi Tri Lock stem was   chosen and it was impacted down to the level of neck cut.  Based on this   and the trial reduction, a 36+1.5 delta ceramic ball was chosen and   impacted onto a clean and dry trunnion, and the hip was reduced.  The   hip had been irrigated throughout the case again at this point.  I did   reapproximate the superior capsular leaflet to the anterior leaflet   using #1 Vicryl.  The fascia of the   tensor fascia lata muscle was then reapproximated using #1 Vicryl and #0 V-lock sutures.  After closure of this layer we injected 2 gm of Tranexamic Acid topically.  The   remaining wound was closed with 2-0 Vicryl and  running 4-0 Monocryl.   The hip  was cleaned, dried, and dressed sterilely using Dermabond and   Aquacel dressing.  She was then brought   to recovery room in stable condition tolerating the procedure well.    Danae Orleans, PA-C was present for the entirety of the case involved from   preoperative positioning, perioperative retractor management, general   facilitation of the case, as well as primary wound closure as assistant.            Pietro Cassis Alvan Dame, M.D.        01/28/2015 11:28 AM

## 2015-01-28 NOTE — Anesthesia Preprocedure Evaluation (Addendum)
Anesthesia Evaluation  Patient identified by MRN, date of birth, ID band Patient awake    Reviewed: Allergy & Precautions, NPO status , Patient's Chart, lab work & pertinent test results  Airway Mallampati: II  TM Distance: >3 FB Neck ROM: Full    Dental no notable dental hx.    Pulmonary sleep apnea , former smoker,  breath sounds clear to auscultation  Pulmonary exam normal       Cardiovascular hypertension, negative cardio ROS  + dysrhythmias Atrial Fibrillation Rhythm:Regular Rate:Normal + Systolic murmurs Aortic stenosis  mild to moderate AS and mild to mod. AR per last echo 02/25/14: Mod LVH, EF 55-60%, no RWMA (mean 16 mmHg),    Left ventricle: The cavity size was normal. There was moderate concentric hypertrophy. Systolic function was normal. The estimated ejection fraction was in the range of 55% to 60%. Wall motion was normal; there were no regional wall motion abnormalities. - Aortic valve: Severely calcified annulus. Trileaflet. Moderate diffuse thickening and calcification. There was mild to moderate stenosis. Mild to moderate regurgitation directed centrally in the LVOT and towards the mitral anterior leaflet. Valve area: 1.09cm^2(VTI). Valve area: 1.14cm^2 (Vmax). - Mitral valve: Mild to moderate regurgitation. - Left atrium: The atrium was moderately to severely dilated. - Right ventricle: The cavity size was mildly dilated. Wall thickness was normal.     Neuro/Psych TIAnegative psych ROS   GI/Hepatic negative GI ROS, Neg liver ROS,   Endo/Other  negative endocrine ROS  Renal/GU negative Renal ROS  negative genitourinary   Musculoskeletal negative musculoskeletal ROS (+)   Abdominal   Peds negative pediatric ROS (+)  Hematology negative hematology ROS (+)   Anesthesia Other Findings   Reproductive/Obstetrics negative OB ROS                             Anesthesia Physical Anesthesia Plan  ASA: III  Anesthesia Plan: General   Post-op Pain Management:    Induction: Intravenous  Airway Management Planned: Oral ETT  Additional Equipment:   Intra-op Plan:   Post-operative Plan: Extubation in OR  Informed Consent: I have reviewed the patients History and Physical, chart, labs and discussed the procedure including the risks, benefits and alternatives for the proposed anesthesia with the patient or authorized representative who has indicated his/her understanding and acceptance.   Dental advisory given  Plan Discussed with: CRNA and Surgeon  Anesthesia Plan Comments:         Anesthesia Quick Evaluation

## 2015-01-28 NOTE — Interval H&P Note (Signed)
History and Physical Interval Note:  01/28/2015 8:53 AM  [name]  has presented today for surgery, with the diagnosis of RIGHT HIP OA  The various methods of treatment have been discussed with the patient and family. After consideration of risks, benefits and other options for treatment, the patient has consented to  Procedure(s): RIGHT TOTAL HIP ARTHROPLASTY ANTERIOR APPROACH (Right) as a surgical intervention .  The patient's history has been reviewed, patient examined, no change in status, stable for surgery.  I have reviewed the patient's chart and labs.  Questions were answered to the patient's satisfaction.     Mauri Pole

## 2015-01-28 NOTE — Evaluation (Signed)
Physical Therapy Evaluation Patient Details Name: [name] MRN: [id number] DOB: Dec 15, 1935 Today's Date: 01/28/2015   History of Present Illness  79 yo female s/p R THA-direct anterior 01/28/15. Hx of L THA-direct anterior.  Clinical Impression  On eval POD 0, pt required Min assist for mobility-able to ambulate ~60 feet with RW. Tolerated activity fairly well.     Follow Up Recommendations Home health PT    Equipment Recommendations  None recommended by PT    Recommendations for Other Services OT consult     Precautions / Restrictions        Mobility  Bed Mobility Overal bed mobility: Needs Assistance Bed Mobility: Supine to Sit     Supine to sit: Min assist     General bed mobility comments: Assist for R LE. Increased time.   Transfers Overall transfer level: Needs assistance Equipment used: Rolling walker (2 wheeled) Transfers: Sit to/from Stand Sit to Stand: Min assist         General transfer comment: VCs safety, technique, hand placement. Assist to rise, stabilize, control descent. Pt reported mild dizziness with sitting EOB  Ambulation/Gait Ambulation/Gait assistance: Min guard Ambulation Distance (Feet): 60 Feet Assistive device: Rolling walker (2 wheeled) Gait Pattern/deviations: Step-through pattern;Decreased stride length;Decreased step length - right;Antalgic;Wide base of support     General Gait Details: slow gait speed. VCs safety, sequence.   Stairs            Wheelchair Mobility    Modified Rankin (Stroke Patients Only)       Balance                                             Pertinent Vitals/Pain Pain Assessment: 0-10 Pain Score: 4  Pain Location: R hip/thigh Pain Intervention(s): Monitored during session;Repositioned;Ice applied    Home Living Family/patient expects to be discharged to:: Private residence Living Arrangements: Spouse/significant other Available Help at Discharge:  Family Type of Home: House Home Access: Stairs to enter Entrance Stairs-Rails: Psychiatric nurse of Steps: 3 Home Layout: Two level;Able to live on main level with bedroom/bathroom Home Equipment: Gilford Rile - 2 wheels;Crutches;Bedside commode      Prior Function Level of Independence: Independent               Hand Dominance        Extremity/Trunk Assessment   Upper Extremity Assessment: Defer to OT evaluation           Lower Extremity Assessment: RLE deficits/detail RLE Deficits / Details: moves ankle well. hip flex at least 2/5, knee ext at least 3/5    Cervical / Trunk Assessment: Normal  Communication   Communication: No difficulties  Cognition Arousal/Alertness: Awake/alert Behavior During Therapy: WFL for tasks assessed/performed Overall Cognitive Status: Within Functional Limits for tasks assessed                      General Comments      Exercises        Assessment/Plan    PT Assessment Patient needs continued PT services  PT Diagnosis Difficulty walking;Acute pain   PT Problem List Decreased strength;Decreased range of motion;Decreased activity tolerance;Decreased mobility;Pain;Decreased knowledge of use of DME  PT Treatment Interventions DME instruction;Gait training;Stair training;Functional mobility training;Therapeutic activities;Therapeutic exercise;Patient/family education   PT Goals (Current goals can be found in the Care Plan section) Acute Rehab PT  Goals Patient Stated Goal: home soon PT Goal Formulation: With patient Time For Goal Achievement: 02/04/15 Potential to Achieve Goals: Good    Frequency 7X/week   Barriers to discharge        Co-evaluation               End of Session Equipment Utilized During Treatment: Gait belt Activity Tolerance: Patient tolerated treatment well Patient left: in chair;with call bell/phone within reach           Time: 5009-3818 PT Time Calculation (min)  (ACUTE ONLY): 12 min   Charges:   PT Evaluation $Initial PT Evaluation Tier I: 1 Procedure     PT G Codes:        Weston Anna, MPT Pager: [phone]

## 2015-01-28 NOTE — Anesthesia Procedure Notes (Signed)
Procedure Name: Intubation Date/Time: 01/28/2015 10:02 AM Performed by: Anne Fu Pre-anesthesia Checklist: Patient identified, Emergency Drugs available, Suction available, Patient being monitored and Timeout performed Patient Re-evaluated:Patient Re-evaluated prior to inductionOxygen Delivery Method: Circle system utilized Preoxygenation: Pre-oxygenation with 100% oxygen Intubation Type: IV induction Ventilation: Mask ventilation without difficulty Laryngoscope Size: Mac and 4 Grade View: Grade I Tube type: Oral Tube size: 7.5 mm Number of attempts: 1 Airway Equipment and Method: Stylet Placement Confirmation: ETT inserted through vocal cords under direct vision,  positive ETCO2,  CO2 detector and breath sounds checked- equal and bilateral Secured at: 20 cm Tube secured with: Tape Dental Injury: Teeth and Oropharynx as per pre-operative assessment

## 2015-01-29 DIAGNOSIS — E663 Overweight: Secondary | ICD-10-CM | POA: Diagnosis present

## 2015-01-29 LAB — CBC
HEMATOCRIT: 36.9 % (ref 36.0–46.0)
HEMOGLOBIN: 12.3 g/dL (ref 12.0–15.0)
MCH: 32 pg (ref 26.0–34.0)
MCHC: 33.3 g/dL (ref 30.0–36.0)
MCV: 96.1 fL (ref 78.0–100.0)
Platelets: 202 10*3/uL (ref 150–400)
RBC: 3.84 MIL/uL — AB (ref 3.87–5.11)
RDW: 13.1 % (ref 11.5–15.5)
WBC: 11.3 10*3/uL — AB (ref 4.0–10.5)

## 2015-01-29 LAB — BASIC METABOLIC PANEL
ANION GAP: 7 (ref 5–15)
BUN: 22 mg/dL (ref 6–23)
CALCIUM: 9.2 mg/dL (ref 8.4–10.5)
CO2: 25 mmol/L (ref 19–32)
Chloride: 108 mmol/L (ref 96–112)
Creatinine, Ser: 0.77 mg/dL (ref 0.50–1.10)
GFR calc Af Amer: >90 mL/min (ref >90)
GFR calc non Af Amer: 79 mL/min — ABNORMAL LOW (ref >90)
Glucose, Bld: 143 mg/dL — ABNORMAL HIGH (ref 70–99)
Potassium: 4.4 mmol/L (ref 3.5–5.1)
Sodium: 140 mmol/L (ref 135–145)

## 2015-01-29 MED ORDER — METHOCARBAMOL 500 MG PO TABS: 500.0000 mg | ORAL_TABLET | Freq: Four times a day (QID) | ORAL | Status: DC | PRN

## 2015-01-29 MED ORDER — DOCUSATE SODIUM 100 MG PO CAPS
100.0000 mg | ORAL_CAPSULE | Freq: Two times a day (BID) | ORAL | Status: DC
Start: 2015-01-29 — End: 2015-08-28

## 2015-01-29 MED ORDER — ACETAMINOPHEN 325 MG PO TABS: 325.0000 mg | ORAL_TABLET | ORAL | Status: DC | PRN

## 2015-01-29 MED ORDER — FERROUS SULFATE 325 (65 FE) MG PO TABS: 325.0000 mg | ORAL_TABLET | Freq: Three times a day (TID) | ORAL | Status: DC

## 2015-01-29 MED ORDER — POLYETHYLENE GLYCOL 3350 17 G PO PACK: 17.0000 g | PACK | Freq: Two times a day (BID) | ORAL | Status: DC

## 2015-01-29 NOTE — Progress Notes (Signed)
Physical Therapy Treatment Patient Details Name: [name] MRN: [id number] DOB: 04-May-1936 Today's Date: 01/29/2015    History of Present Illness 79 yo female s/p R THA-direct anterior 01/28/15. Hx of L THA-direct anterior.    PT Comments    Progressing well with mobility. Practiced ambulation, exercises, and stair negotiation. Pt tolerated activity well. All education completed. Ready to d/c from PT standpoint.   Follow Up Recommendations  Home health PT     Equipment Recommendations  None recommended by PT    Recommendations for Other Services       Precautions / Restrictions Precautions Precautions: None Restrictions Weight Bearing Restrictions: No RLE Weight Bearing: Weight bearing as tolerated    Mobility  Bed Mobility               General bed mobility comments: pt OOB in recliner  Transfers Overall transfer level: Needs assistance Equipment used: Rolling walker (2 wheeled) Transfers: Sit to/from Stand Sit to Stand: Supervision         General transfer comment: VCS safety, hand placement  Ambulation/Gait Ambulation/Gait assistance: Min guard Ambulation Distance (Feet): 125 Feet Assistive device: Rolling walker (2 wheeled) Gait Pattern/deviations: Step-through pattern;Decreased stride length;Decreased step length - right;Antalgic     General Gait Details: close guard for safety.    Stairs Stairs: Yes Stairs assistance: Min guard Stair Management: Two rails;Step to pattern;Forwards Number of Stairs: 2 General stair comments: close guard for safety. VCS safety, technique, sequence  Wheelchair Mobility    Modified Rankin (Stroke Patients Only)       Balance                                    Cognition Arousal/Alertness: Awake/alert Behavior During Therapy: WFL for tasks assessed/performed Overall Cognitive Status: Within Functional Limits for tasks assessed                      Exercises Total Joint  Exercises Ankle Circles/Pumps: AROM;Both;10 reps;Seated Quad Sets: AROM;Both;10 reps;Seated Heel Slides: AAROM;10 reps;Seated Hip ABduction/ADduction: AAROM;Right;10 reps;Seated Long Arc Quad: AROM;Right;10 reps;Seated    General Comments        Pertinent Vitals/Pain Pain Assessment: 0-10 Pain Score: 4  Pain Location: R hip/thigh Pain Descriptors / Indicators: Aching;Sore Pain Intervention(s): Monitored during session;Ice applied;Repositioned    Home Living                      Prior Function            PT Goals (current goals can now be found in the care plan section) Progress towards PT goals: Progressing toward goals    Frequency  7X/week    PT Plan Current plan remains appropriate    Co-evaluation             End of Session Equipment Utilized During Treatment: Gait belt Activity Tolerance: Patient tolerated treatment well Patient left: in chair;with call bell/phone within reach     Time: 1287-8676 PT Time Calculation (min) (ACUTE ONLY): 13 min  Charges:  $Gait Training: 8-22 mins                    G Codes:      Weston Anna, MPT Pager: [phone]

## 2015-01-29 NOTE — Progress Notes (Signed)
OT Cancellation Note  Patient Details Name: [name] MRN: [id number] DOB: 1936/07/11   Cancelled Treatment:    Reason Eval/Treat Not Completed: OT screened, no needs identified, will sign off. Pt feels comfortable with toilet transfers and ADLs--performed already this am.  She has a 3:1 commode for toilet and shower.  Verbalizes understanding of shower transfer and didn't feel she needed to practice.  Will sign off.  Kinston Magnan 01/29/2015, 8:14 AM  Lesle Chris, OTR/L [phone] 01/29/2015

## 2015-01-29 NOTE — Discharge Instructions (Signed)

## 2015-01-29 NOTE — Care Management Note (Signed)
    Page 1 of 1   01/29/2015     10:22:46 AM CARE MANAGEMENT NOTE 01/29/2015  Patient:  [name], [name]   Account Number:  [id number]  Date Initiated:  01/29/2015  Documentation initiated by:  Sunday Spillers  Subjective/Objective Assessment:   79 yo female admitted s/p right THA.     Action/Plan:   Discharge planning   Anticipated DC Date:  01/29/2015   Anticipated DC Plan:  [place]  CM consult      [facility]  HOME HEALTH   Choice offered to / List presented to:  C-1 Patient        [place] arranged  HH-2 PT      [place]   Status of service:  Completed, signed off Medicare Important Message given?   (If response is "NO", the following Medicare IM given date fields will be blank) Date Medicare IM given:   Medicare IM given by:   Date Additional Medicare IM given:   Additional Medicare IM given by:    Discharge Disposition:  [place]  Per UR Regulation:  Reviewed for med. necessity/level of care/duration of stay  If discussed at [place] of Stay Meetings, dates discussed:    Comments:  01-29-15 Sunday Spillers RN CM 1000 Spoke with patient at bedside. States plans to use [facility] services for PT. Has all needed DME. Contacted [facility] with [facility] for referral.

## 2015-01-29 NOTE — Progress Notes (Signed)
     Subjective: 1 Day Post-Op Procedure(s) (LRB): RIGHT TOTAL HIP ARTHROPLASTY ANTERIOR APPROACH (Right)   Patient reports pain as mild, pain controlled. No events throughout the night. Ready to be discharged home if she continues to do well.  Objective:   VITALS:   Filed Vitals:   01/29/15 0502  BP: 116/76  Pulse: 84  Temp: 98.4 F (36.9 C)  Resp: 16    Dorsiflexion/Plantar flexion intact Incision: dressing C/D/I No cellulitis present Compartment soft  LABS  Recent Labs  01/29/15 0450  HGB 12.3  HCT 36.9  WBC 11.3*  PLT 202     Recent Labs  01/29/15 0450  NA 140  K 4.4  BUN 22  CREATININE 0.77  GLUCOSE 143*     Assessment/Plan: 1 Day Post-Op Procedure(s) (LRB): RIGHT TOTAL HIP ARTHROPLASTY ANTERIOR APPROACH (Right) Foley cath d/c'ed Advance diet Up with therapy D/C IV fluids Discharge home with home health  Follow up in 2 weeks at [facility]. Follow up with OLIN,Mccall Lomax D in 2 weeks.  Contact information:  [facility] [address], [place] [phone]    Overweight (BMI 25-29.9) Estimated body mass index is 27.68 kg/(m^2) as calculated from the following:   Height as of this encounter: 5\' 9"  (1.753 m).   Weight as of this encounter: 85.049 kg (187 lb 8 oz). Patient also counseled that weight may inhibit the healing process Patient counseled that losing weight will help with future health issues         West Pugh. [name]   PAC  01/29/2015, 8:17 AM

## 2015-01-30 DIAGNOSIS — M1990 Unspecified osteoarthritis, unspecified site: Secondary | ICD-10-CM | POA: Diagnosis not present

## 2015-01-30 DIAGNOSIS — I10 Essential (primary) hypertension: Secondary | ICD-10-CM | POA: Diagnosis not present

## 2015-01-30 DIAGNOSIS — I5032 Chronic diastolic (congestive) heart failure: Secondary | ICD-10-CM | POA: Diagnosis not present

## 2015-01-30 DIAGNOSIS — M797 Fibromyalgia: Secondary | ICD-10-CM | POA: Diagnosis not present

## 2015-01-30 DIAGNOSIS — I482 Chronic atrial fibrillation: Secondary | ICD-10-CM | POA: Diagnosis not present

## 2015-01-30 DIAGNOSIS — Z471 Aftercare following joint replacement surgery: Secondary | ICD-10-CM | POA: Diagnosis not present

## 2015-02-03 DIAGNOSIS — M1990 Unspecified osteoarthritis, unspecified site: Secondary | ICD-10-CM | POA: Diagnosis not present

## 2015-02-03 DIAGNOSIS — I10 Essential (primary) hypertension: Secondary | ICD-10-CM | POA: Diagnosis not present

## 2015-02-03 DIAGNOSIS — I482 Chronic atrial fibrillation: Secondary | ICD-10-CM | POA: Diagnosis not present

## 2015-02-03 DIAGNOSIS — M797 Fibromyalgia: Secondary | ICD-10-CM | POA: Diagnosis not present

## 2015-02-03 DIAGNOSIS — Z471 Aftercare following joint replacement surgery: Secondary | ICD-10-CM | POA: Diagnosis not present

## 2015-02-03 DIAGNOSIS — I5032 Chronic diastolic (congestive) heart failure: Secondary | ICD-10-CM | POA: Diagnosis not present

## 2015-02-04 NOTE — Discharge Summary (Signed)
Physician Discharge Summary  Patient ID: [name] MRN: [id number] DOB/AGE: 1936-05-10 79 y.o.  Admit date: 01/28/2015 Discharge date: 01/29/2015   Procedures:  Procedure(s) (LRB): RIGHT TOTAL HIP ARTHROPLASTY ANTERIOR APPROACH (Right)  Attending Physician:  Dr. Paralee Cancel   Admission Diagnoses:   Right hip primary OA / pain  Discharge Diagnoses:  Principal Problem:   S/P right THA, AA Active Problems:   Overweight (BMI 25.0-29.9)  Past Medical History  Diagnosis Date  . Hypertension   . Arthritis   . GERD (gastroesophageal reflux disease)   . Complication of anesthesia     extremly claustophobic  . Aortic stenosis     mild to moderate AS and mild to mod. AR per last echo 02/25/14:  Mod LVH, EF 55-60%, no RWMA (mean 16 mmHg),   . Chronic atrial fibrillation CARDIOLOGIST -- DR Tressia Miners TURNER    controlled  . Chronic diastolic CHF (congestive heart failure), NYHA class 1   . Fibromyalgia   . History of TIA (transient ischemic attack)     per scan--  no residual  . Hyperlipidemia   . History of breast cancer no recurrence    dx 2004   s/p  left breast mastectomy w/ node dissection/   no chemo or radiation  . Heart murmur     mild to mod AS, mild to mod. AR and MR, mild  TR per echo 02-25-2014  . OSA on CPAP      severe/  AHI 34/hr  . Chronic anticoagulation   . Urge urinary incontinence   . Wears glasses   . Short of breath on exertion     01-16-15 not a problem now.  . Cancer     left breast mastectomy(cancer)-surgery only    HPI:    [name], 79 y.o. female, has a history of pain and functional disability in the right hip(s) due to arthritis and patient has failed non-surgical conservative treatments for greater than 12 weeks to include NSAID's and/or analgesics, corticosteriod injections and activity modification. Onset of symptoms was gradual starting years ago with gradually worsening course since that time.The patient noted prior procedures  of the hip to include arthroplasty on the right hip on 07/17/2013 per Dr. Alvan Dame. Patient currently rates pain in the right hip at 7 out of 10 with activity. Patient has night pain, worsening of pain with activity and weight bearing, trendelenberg gait, pain that interfers with activities of daily living and pain with passive range of motion. Patient has evidence of periarticular osteophytes and joint space narrowing by imaging studies. This condition presents safety issues increasing the risk of falls. There is no current active infection. Risks, benefits and expectations were discussed with the patient. Risks including but not limited to the risk of anesthesia, blood clots, nerve damage, blood vessel damage, failure of the prosthesis, infection and up to and including death. Patient understand the risks, benefits and expectations and wishes to proceed with surgery.   PCP: Jonathon Bellows, MD   Discharged Condition: good  Hospital Course:  Patient underwent the above stated procedure on 01/28/2015. Patient tolerated the procedure well and brought to the recovery room in good condition and subsequently to the Wise.  POD #1 BP: 116/76 ; Pulse: 84 ; Temp: 98.4 F (36.9 C) ; Resp: 16 Patient reports pain as mild, pain controlled. No events throughout the night. Ready to be discharged home. Dorsiflexion/plantar flexion intact, incision: dressing C/D/I, no cellulitis present and compartment soft.   LABS  Basename    HGB  12.3  HCT  36.9    Discharge Exam: General appearance: alert, cooperative and no distress Extremities: Homans sign is negative, no sign of DVT, no edema, redness or tenderness in the calves or thighs and no ulcers, gangrene or trophic changes  Disposition: Home with follow up in 2 weeks   Follow-up Information    Follow up with Mauri Pole, MD. Schedule an appointment as soon as possible for a visit in 2 weeks.   Specialty:  Orthopedic Surgery   Contact information:    [address] [place] [address] [phone]       Discharge Instructions    Call MD / Call 911    Complete by:  As directed   If you experience chest pain or shortness of breath, CALL 911 and be transported to the hospital emergency room.  If you develope a fever above 101 F, pus (white drainage) or increased drainage or redness at the wound, or calf pain, call your surgeon's office.     Change dressing    Complete by:  As directed   Maintain surgical dressing until follow up in the clinic. If the edges start to pull up, may reinforce with tape. If the dressing is no longer working, may remove and cover with gauze and tape, but must keep the area dry and clean.  Call with any questions or concerns.     Constipation Prevention    Complete by:  As directed   Drink plenty of fluids.  Prune juice may be helpful.  You may use a stool softener, such as Colace (over the counter) 100 mg twice a day.  Use MiraLax (over the counter) for constipation as needed.     Diet - low sodium heart healthy    Complete by:  As directed      Discharge instructions    Complete by:  As directed   Maintain surgical dressing until follow up in the clinic. If the edges start to pull up, may reinforce with tape. If the dressing is no longer working, may remove and cover with gauze and tape, but must keep the area dry and clean.  Follow up in 2 weeks at [facility]. Call with any questions or concerns.     Increase activity slowly as tolerated    Complete by:  As directed      TED hose    Complete by:  As directed   Use stockings (TED hose) for 2 weeks on both leg(s).  You may remove them at night for sleeping.     Weight bearing as tolerated    Complete by:  As directed   Laterality:  right  Extremity:  Lower             Medication List    STOP taking these medications        cephALEXin 500 MG capsule  Commonly known as:  KEFLEX     diltiazem 180 MG 24 hr capsule    Commonly known as:  CARDIZEM CD     HYDROmorphone 2 MG tablet  Commonly known as:  DILAUDID      TAKE these medications        acetaminophen 325 MG tablet  Commonly known as:  TYLENOL  Take 1-2 tablets (325-650 mg total) by mouth every 4 (four) hours as needed for mild pain or moderate pain.     atorvastatin 40 MG tablet  Commonly known as:  LIPITOR  TAKE 1 TABLET BY MOUTH ONCE EVERY MORNING     BENICAR 20 MG tablet  Generic drug:  olmesartan  TAKE 1/2 TABLET BY MOUTH ONCE EVERY MORNING     calcium citrate 950 MG tablet  Commonly known as:  CALCITRATE - dosed in mg elemental calcium  Take 200 mg of elemental calcium by mouth daily.     diltiazem 360 MG 24 hr capsule  Commonly known as:  TIAZAC  Take 360 mg by mouth every evening.     docusate sodium 100 MG capsule  Commonly known as:  COLACE  Take 1 capsule (100 mg total) by mouth 2 (two) times daily.     ferrous sulfate 325 (65 FE) MG tablet  Take 1 tablet (325 mg total) by mouth 3 (three) times daily after meals.     furosemide 20 MG tablet  Commonly known as:  LASIX  TAKE 1 TABLET (20 MG TOTAL) BY MOUTH DAILY.     glucosamine-chondroitin 500-400 MG tablet  Take 1 tablet by mouth 2 (two) times daily.     Melatonin 10 MG Tabs  Take 1 tablet by mouth at bedtime as needed.     methocarbamol 500 MG tablet  Commonly known as:  ROBAXIN  Take 1 tablet (500 mg total) by mouth every 6 (six) hours as needed for muscle spasms.     multivitamins with iron Tabs tablet  Take 1 tablet by mouth daily.     nitroGLYCERIN 0.4 MG SL tablet  Commonly known as:  NITROSTAT  Place 0.4 mg under the tongue every 5 (five) minutes as needed. For chest pain     omeprazole 20 MG capsule  Commonly known as:  PRILOSEC  Take 20 mg by mouth every morning.     polyethylene glycol packet  Commonly known as:  MIRALAX / GLYCOLAX  Take 17 g by mouth 2 (two) times daily.     rivaroxaban 20 MG Tabs tablet  Commonly known as:  XARELTO   Take 20 mg by mouth daily with supper.         Signed: [place] Pugh. Arriyanna Mersch   PA-C  02/04/2015, 1:42 PM

## 2015-02-06 DIAGNOSIS — M1990 Unspecified osteoarthritis, unspecified site: Secondary | ICD-10-CM | POA: Diagnosis not present

## 2015-02-06 DIAGNOSIS — I5032 Chronic diastolic (congestive) heart failure: Secondary | ICD-10-CM | POA: Diagnosis not present

## 2015-02-06 DIAGNOSIS — M797 Fibromyalgia: Secondary | ICD-10-CM | POA: Diagnosis not present

## 2015-02-06 DIAGNOSIS — Z471 Aftercare following joint replacement surgery: Secondary | ICD-10-CM | POA: Diagnosis not present

## 2015-02-06 DIAGNOSIS — I482 Chronic atrial fibrillation: Secondary | ICD-10-CM | POA: Diagnosis not present

## 2015-02-06 DIAGNOSIS — I10 Essential (primary) hypertension: Secondary | ICD-10-CM | POA: Diagnosis not present

## 2015-02-08 DIAGNOSIS — I10 Essential (primary) hypertension: Secondary | ICD-10-CM | POA: Diagnosis not present

## 2015-02-08 DIAGNOSIS — I482 Chronic atrial fibrillation: Secondary | ICD-10-CM | POA: Diagnosis not present

## 2015-02-08 DIAGNOSIS — M1990 Unspecified osteoarthritis, unspecified site: Secondary | ICD-10-CM | POA: Diagnosis not present

## 2015-02-08 DIAGNOSIS — I5032 Chronic diastolic (congestive) heart failure: Secondary | ICD-10-CM | POA: Diagnosis not present

## 2015-02-08 DIAGNOSIS — M797 Fibromyalgia: Secondary | ICD-10-CM | POA: Diagnosis not present

## 2015-02-08 DIAGNOSIS — Z471 Aftercare following joint replacement surgery: Secondary | ICD-10-CM | POA: Diagnosis not present

## 2015-02-10 DIAGNOSIS — M1990 Unspecified osteoarthritis, unspecified site: Secondary | ICD-10-CM | POA: Diagnosis not present

## 2015-02-10 DIAGNOSIS — M797 Fibromyalgia: Secondary | ICD-10-CM | POA: Diagnosis not present

## 2015-02-10 DIAGNOSIS — Z471 Aftercare following joint replacement surgery: Secondary | ICD-10-CM | POA: Diagnosis not present

## 2015-02-10 DIAGNOSIS — I10 Essential (primary) hypertension: Secondary | ICD-10-CM | POA: Diagnosis not present

## 2015-02-10 DIAGNOSIS — I482 Chronic atrial fibrillation: Secondary | ICD-10-CM | POA: Diagnosis not present

## 2015-02-10 DIAGNOSIS — I5032 Chronic diastolic (congestive) heart failure: Secondary | ICD-10-CM | POA: Diagnosis not present

## 2015-02-11 DIAGNOSIS — N3946 Mixed incontinence: Secondary | ICD-10-CM | POA: Diagnosis not present

## 2015-02-11 DIAGNOSIS — R350 Frequency of micturition: Secondary | ICD-10-CM | POA: Diagnosis not present

## 2015-02-12 DIAGNOSIS — Z471 Aftercare following joint replacement surgery: Secondary | ICD-10-CM | POA: Diagnosis not present

## 2015-02-12 DIAGNOSIS — Z96641 Presence of right artificial hip joint: Secondary | ICD-10-CM | POA: Diagnosis not present

## 2015-02-18 DIAGNOSIS — H2513 Age-related nuclear cataract, bilateral: Secondary | ICD-10-CM | POA: Diagnosis not present

## 2015-02-24 ENCOUNTER — Telehealth: Payer: Self-pay

## 2015-02-24 NOTE — Telephone Encounter (Signed)
[facility] clearance received for colonoscopy May 17. Patient is currently on Xarelto and, per Dr. Radford Pax, needs to be bridged due to chronic Afib with h/o TIA.   To Pharmacy for instruction.

## 2015-02-26 DIAGNOSIS — K5730 Diverticulosis of large intestine without perforation or abscess without bleeding: Secondary | ICD-10-CM | POA: Diagnosis not present

## 2015-02-26 DIAGNOSIS — Z1211 Encounter for screening for malignant neoplasm of colon: Secondary | ICD-10-CM | POA: Diagnosis not present

## 2015-02-26 NOTE — Telephone Encounter (Signed)
No need to bridge with Lovenox while off Xarelto because they have similar pharmacokinetics.  Given her history of TIA, if possible to do screening colonoscopy on Xarelto, this would be preferred.  If she has a history of polyps and unable to do this, would hold Xarelto x 1 dose and restart Xarelto ASAP.    Will fax to [facility].

## 2015-03-04 ENCOUNTER — Other Ambulatory Visit: Payer: Self-pay | Admitting: Gastroenterology

## 2015-03-04 DIAGNOSIS — K6389 Other specified diseases of intestine: Secondary | ICD-10-CM | POA: Diagnosis not present

## 2015-03-04 DIAGNOSIS — K5730 Diverticulosis of large intestine without perforation or abscess without bleeding: Secondary | ICD-10-CM | POA: Diagnosis not present

## 2015-03-04 DIAGNOSIS — Z1211 Encounter for screening for malignant neoplasm of colon: Secondary | ICD-10-CM | POA: Diagnosis not present

## 2015-03-04 DIAGNOSIS — D123 Benign neoplasm of transverse colon: Secondary | ICD-10-CM | POA: Diagnosis not present

## 2015-03-04 DIAGNOSIS — D126 Benign neoplasm of colon, unspecified: Secondary | ICD-10-CM | POA: Diagnosis not present

## 2015-03-19 DIAGNOSIS — Z471 Aftercare following joint replacement surgery: Secondary | ICD-10-CM | POA: Diagnosis not present

## 2015-03-19 DIAGNOSIS — M25562 Pain in left knee: Secondary | ICD-10-CM | POA: Diagnosis not present

## 2015-03-19 DIAGNOSIS — Z96641 Presence of right artificial hip joint: Secondary | ICD-10-CM | POA: Diagnosis not present

## 2015-03-19 DIAGNOSIS — M1712 Unilateral primary osteoarthritis, left knee: Secondary | ICD-10-CM | POA: Diagnosis not present

## 2015-04-14 ENCOUNTER — Other Ambulatory Visit: Payer: Self-pay

## 2015-06-09 ENCOUNTER — Other Ambulatory Visit: Payer: Self-pay | Admitting: Cardiology

## 2015-06-09 ENCOUNTER — Other Ambulatory Visit (HOSPITAL_COMMUNITY): Payer: Self-pay | Admitting: Family Medicine

## 2015-06-09 DIAGNOSIS — Z1231 Encounter for screening mammogram for malignant neoplasm of breast: Secondary | ICD-10-CM

## 2015-06-24 ENCOUNTER — Ambulatory Visit (HOSPITAL_COMMUNITY): Payer: Medicare Other

## 2015-06-24 ENCOUNTER — Ambulatory Visit (HOSPITAL_COMMUNITY)
Admission: RE | Admit: 2015-06-24 | Discharge: 2015-06-24 | Disposition: A | Discharge status: Home / Self Care | Payer: Medicare Other | Source: Ambulatory Visit | Attending: Family Medicine | Admitting: Family Medicine

## 2015-06-24 ENCOUNTER — Other Ambulatory Visit (HOSPITAL_COMMUNITY): Payer: Self-pay | Admitting: Family Medicine

## 2015-06-24 DIAGNOSIS — Z1231 Encounter for screening mammogram for malignant neoplasm of breast: Secondary | ICD-10-CM

## 2015-07-02 DIAGNOSIS — R350 Frequency of micturition: Secondary | ICD-10-CM | POA: Diagnosis not present

## 2015-07-02 DIAGNOSIS — N3946 Mixed incontinence: Secondary | ICD-10-CM | POA: Diagnosis not present

## 2015-07-24 DIAGNOSIS — M25562 Pain in left knee: Secondary | ICD-10-CM | POA: Diagnosis not present

## 2015-08-05 DIAGNOSIS — Z23 Encounter for immunization: Secondary | ICD-10-CM | POA: Diagnosis not present

## 2015-08-05 DIAGNOSIS — L309 Dermatitis, unspecified: Secondary | ICD-10-CM | POA: Diagnosis not present

## 2015-08-08 DIAGNOSIS — L309 Dermatitis, unspecified: Secondary | ICD-10-CM | POA: Diagnosis not present

## 2015-08-08 DIAGNOSIS — L821 Other seborrheic keratosis: Secondary | ICD-10-CM | POA: Diagnosis not present

## 2015-08-08 DIAGNOSIS — L570 Actinic keratosis: Secondary | ICD-10-CM | POA: Diagnosis not present

## 2015-08-27 NOTE — Progress Notes (Signed)
Cardiology Office Note   Date:  08/28/2015   ID:  [name], DOB [birth year], MRN [id number]   Patient Care Team: Maurice Small, MD as PCP - General (Family Medicine) Eston Esters, MD (Hematology and Oncology) Sueanne Margarita, MD as Attending Physician (Cardiology) Neldon Mc, MD (General Surgery) Thompson Grayer, MD as Consulting Physician (Clinical Cardiac Electrophysiology)    Chief Complaint  Patient presents with  . Follow-up  . Congestive Heart Failure  . Atrial Fibrillation     History of Present Illness: [name] is a 79 y.o. [place] female with a hx of perm AFib, mild to mod AS, diastolic HF, HTN, OSA on CPAP, HL. She has seen Dr. Rayann Heman in the past and she is treated with a rate control strategy for her AFib.  Last seen by Dr. Radford Pax 9/15.  Returns for surgical clearance.  She needs L knee replacement with Dr. Alvan Dame.  She denies any chest pain or dyspnea. She denies any syncope, orthopnea, PND, edema.  She is limited by her knee pain. She can navigate steps twice a day, slowly. She cannot vacuum, lift heavy objects, etc.    CHADS2-VASc=6 (age, gender, HTN, prior TIA).   Studies/Reports Reviewed Today:  Echo 02/25/14:  Moderate LVH, EF 55-60%, normal wall motion, mild to moderate AS (mean 16 mmHg), mild to moderate AI, mild to moderate MR, moderate to severe LAE  Echo (07/2012):  Mild LVH, EF 55-60%, no RWMA, Gr 1 DD, mild to mod AS (mean 13 mmHg), mod AI, MAC, mild MR, mod LAE, mild RAE, mod TR, mod PI  Nuclear (12/2011):  No ischemia, EF 58%   Past Medical History  Diagnosis Date  . Hypertension   . Arthritis   . GERD (gastroesophageal reflux disease)   . Complication of anesthesia     extremly claustophobic  . Aortic stenosis     mild to moderate AS and mild to mod. AR per last echo 02/25/14:  Mod LVH, EF 55-60%, no RWMA (mean 16 mmHg),   . Chronic atrial fibrillation (HCC) CARDIOLOGIST -- DR Tressia Miners TURNER    controlled  . Chronic  diastolic CHF (congestive heart failure), NYHA class 1 ([place])   . Fibromyalgia   . History of TIA (transient ischemic attack)     per scan--  no residual  . Hyperlipidemia   . History of breast cancer no recurrence    dx 2004   s/p  left breast mastectomy w/ node dissection/   no chemo or radiation  . Heart murmur     mild to mod AS, mild to mod. AR and MR, mild  TR per echo 02-25-2014  . OSA on CPAP      severe/  AHI 34/hr  . Chronic anticoagulation   . Urge urinary incontinence   . Wears glasses   . Short of breath on exertion     01-16-15 not a problem now.  . Cancer [facility])     left breast mastectomy(cancer)-surgery only    Past Surgical History  Procedure Laterality Date  . Carpal tunnel release Right 07-08-2004  . Appendectomy  1965  . Tubal ligation  1973  . Mastectomy, modified radical w/reconstruction  09-16-2003    left breast W/ SLN DISSECTION  . Total shoulder arthroplasty  08/11/2012    Procedure: TOTAL SHOULDER ARTHROPLASTY;  Surgeon: Augustin Schooling, MD;  Location: [facility];  Service: Orthopedics;  Laterality: Right;  RIGHT  TOTAL SHOULDER  ARTHROPLASTY   . Total hip arthroplasty Left  07/17/2013    Procedure: LEFT TOTAL HIP ARTHROPLASTY ANTERIOR APPROACH;  Surgeon: Mauri Pole, MD;  Location: [facility];  Service: Orthopedics;  Laterality: Left;  . Abdominoplasty  1987  . Revision breast reconstruction Left 11-11-2004    12-03-2004  DRAINAGE SEREMA LEFT CHEST  . Explantation breast implant and debridement  09-15-2005  . Replace tissue expander left breast and total capsulectomy  08-05-2006  . Colonoscopy with esophagogastroduodenoscopy (egd)  2006  . Transthoracic echocardiogram  02-25-2014    moderate LVH/  ef 55-60%/  mod. to sev. calcification AV with mild to moderate AV stenosis/  mild to mod. AR and MR/ mild PR/   moderate to severe LAE  . Interstim implant placement N/A 08/22/2014    Procedure: INTERSTIM IMPLANT STAGE ONE/TWO;  Surgeon: Reece Packer, MD;   Location: [facility];  Service: Urology;  Laterality: N/A;  . Total hip arthroplasty Right 01/28/2015    Procedure: RIGHT TOTAL HIP ARTHROPLASTY ANTERIOR APPROACH;  Surgeon: Paralee Cancel, MD;  Location: [facility];  Service: Orthopedics;  Laterality: Right;     Current Outpatient Prescriptions  Medication Sig Dispense Refill  . acetaminophen (TYLENOL) 325 MG tablet Take 1-2 tablets (325-650 mg total) by mouth every 4 (four) hours as needed for mild pain or moderate pain.    Marland Kitchen atorvastatin (LIPITOR) 40 MG tablet TAKE 1 TABLET BY MOUTH ONCE EVERY MORNING 90 tablet 1  . BENICAR 20 MG tablet TAKE 1/2 TABLET BY MOUTH ONCE EVERY MORNING 45 tablet 1  . calcium citrate (CALCITRATE - DOSED IN MG ELEMENTAL CALCIUM) 950 MG tablet Take 200 mg of elemental calcium by mouth daily.    Marland Kitchen diltiazem (TIAZAC) 360 MG 24 hr capsule Take 360 mg by mouth every evening.    . furosemide (LASIX) 20 MG tablet TAKE 1 TABLET (20 MG TOTAL) BY MOUTH DAILY. 90 tablet 1  . glucosamine-chondroitin 500-400 MG tablet Take 1 tablet by mouth 2 (two) times daily.    . Melatonin 10 MG TABS Take 1 tablet by mouth at bedtime as needed.     . methocarbamol (ROBAXIN) 500 MG tablet Take 1 tablet (500 mg total) by mouth every 6 (six) hours as needed for muscle spasms. 50 tablet 0  . Multiple Vitamins-Iron (MULTIVITAMINS WITH IRON) TABS tablet Take 1 tablet by mouth daily.    . nitroGLYCERIN (NITROSTAT) 0.4 MG SL tablet Place 0.4 mg under the tongue every 5 (five) minutes as needed. For chest pain    . omeprazole (PRILOSEC) 20 MG capsule Take 20 mg by mouth every morning.     . rivaroxaban (XARELTO) 20 MG TABS tablet Take 20 mg by mouth daily with supper.     No current facility-administered medications for this visit.    Allergies:   Beta adrenergic blockers; Betapace; Other; Oxycodone; Crestor; Adhesive; Clonidine derivatives; and Tramadol    Social History:   Social History   Social History  . Marital Status: Married     Spouse Name: N/A  . Number of Children: N/A  . Years of Education: N/A   Social History Main Topics  . Smoking status: Former Smoker -- 0.25 packs/day for 10 years    Quit date: 08/09/1985  . Smokeless tobacco: Never Used  . Alcohol Use: No  . Drug Use: No  . Sexual Activity: Yes   Other Topics Concern  . None   Social History Narrative     Family History:   Family History  Problem Relation Age of Onset  .  Heart disease Mother   . Heart disease Father       ROS:   Please see the history of present illness.   Review of Systems  Musculoskeletal: Positive for joint swelling.  All other systems reviewed and are negative.     PHYSICAL EXAM: VS:  BP 170/60 mmHg  Pulse 80  Ht 5\' 7"  (1.702 m)  Wt 173 lb 12.8 oz (78.835 kg)  BMI 27.21 kg/m2    Wt Readings from Last 3 Encounters:  08/28/15 173 lb 12.8 oz (78.835 kg)  01/28/15 187 lb 8 oz (85.049 kg)  01/16/15 187 lb 8 oz (85.049 kg)     GEN: Well nourished, well developed, in no acute distress HEENT: normal Neck: no JVD, no carotid bruits, no masses Cardiac:  Normal S1/S2, irreg irreg rhythm,  2/6 systolic murmur RUSB,  no rubs or gallops, no edema   Respiratory:  clear to auscultation bilaterally, no wheezing, rhonchi or rales. GI: soft, nontender, nondistended, + BS MS: no deformity or atrophy Skin: warm and dry  Neuro:  CNs II-XII intact, Strength and sensation are intact Psych: Normal affect   EKG:  EKG is ordered today.  It demonstrates:   AFib, HR 80, normal axis, QTc 452 ms   Recent Labs: 01/29/2015: BUN 22; Creatinine, Ser 0.77; Hemoglobin 12.3; Platelets 202; Potassium 4.4; Sodium 140    Lipid Panel No results found for: CHOL, TRIG, HDL, CHOLHDL, VLDL, LDLCALC, LDLDIRECT    ASSESSMENT AND PLAN:  1. Surgical Clearance:  She cannot achieve 4 METs due to limitations from her DJD.  She has CRFs of age, gender, HTN, FHx.  It has been > 3 years since her last assessment for ischemia.  Reviewed with  Dr. Radford Pax.  Will obtain Lexiscan Myoview for risk stratification prior to her undergoing surgery.  2. Aortic Stenosis:  No symptoms to suggest worsening.  Last Echo > 12 mos ago.  Repeat Echo to reassess aortic stenosis prior to surgery.  3. Chronic Atrial Fibrillation:  Rate controlled.  She has a high risk of stroke.  She is tolerating Xarelto.  She does have a hx of TIA.  If there is a possibility of performing her surgery on anticoagulation, that would be the best case scenario.  However, I suspect her bleeding risk is too great.  Ok to hold Xarelto 48 hours prior to surgery (reviewed with PharmD).  Patient will need Xarelto resumed 24 hours after surgery.    4. HTN:  BP elevated.  I have asked her to monitor her BP and to let us know some readings after 2 weeks.  Adjust angiotensin receptor blocker if needed.   5. Hyperlipidemia:  Continue statin.  6. Chronic Diastolic CHF:  Volume stable. Continue Lasix.  She will need close attention to volume status in the perioperative period.    7. OSA:  Continue CPAP.      Medication Changes: Current medicines are reviewed at length with the patient today.  Concerns regarding medicines are as outlined above.  The following changes have been made:   Discontinued Medications   DOCUSATE SODIUM (COLACE) 100 MG CAPSULE    Take 1 capsule (100 mg total) by mouth 2 (two) times daily.   FERROUS SULFATE 325 (65 FE) MG TABLET    Take 1 tablet (325 mg total) by mouth 3 (three) times daily after meals.   POLYETHYLENE GLYCOL (MIRALAX / GLYCOLAX) PACKET    Take 17 g by mouth 2 (two) times daily.   Modified Medications  No medications on file   New Prescriptions   No medications on file   Labs/ tests ordered today include:   Orders Placed This Encounter  Procedures  . Myocardial Perfusion Imaging  . EKG 12-Lead  . Echocardiogram     Disposition:    She is traveling to [place] next week. Will try to get her tests scheduled ASAP to avoid delays in  her surgery. FU with Dr. Fransico Him 6 mos.     Signed, Versie Starks, MHS 08/28/2015 4:49 PM    [facility] [address], [place], [place]  [address] Phone: [phone]; Fax: [phone]

## 2015-08-28 ENCOUNTER — Encounter: Payer: Self-pay | Admitting: Physician Assistant

## 2015-08-28 ENCOUNTER — Ambulatory Visit (INDEPENDENT_AMBULATORY_CARE_PROVIDER_SITE_OTHER): Payer: Medicare Other | Admitting: Physician Assistant

## 2015-08-28 VITALS — BP 170/60 | HR 80 | Ht 67.0 in | Wt 173.8 lb

## 2015-08-28 DIAGNOSIS — G4733 Obstructive sleep apnea (adult) (pediatric): Secondary | ICD-10-CM

## 2015-08-28 DIAGNOSIS — I482 Chronic atrial fibrillation, unspecified: Secondary | ICD-10-CM

## 2015-08-28 DIAGNOSIS — I10 Essential (primary) hypertension: Secondary | ICD-10-CM

## 2015-08-28 DIAGNOSIS — I5032 Chronic diastolic (congestive) heart failure: Secondary | ICD-10-CM

## 2015-08-28 DIAGNOSIS — E7800 Pure hypercholesterolemia, unspecified: Secondary | ICD-10-CM

## 2015-08-28 DIAGNOSIS — Z01818 Encounter for other preprocedural examination: Secondary | ICD-10-CM | POA: Diagnosis not present

## 2015-08-28 DIAGNOSIS — I350 Nonrheumatic aortic (valve) stenosis: Secondary | ICD-10-CM | POA: Diagnosis not present

## 2015-08-28 NOTE — Patient Instructions (Signed)
Medication Instructions:  1. Your physician recommends that you continue on your current medications as directed. Please refer to the Current Medication list given to you today.  Labwork: NONE  Testing/Procedures: 1. Your physician has requested that you have an echocardiogram [place] Monday 11/14; PT WILL BE OUT OF THE COUNTRY AFTER THIS. Echocardiography is a painless test that uses sound waves to create images of your heart. It provides your doctor with information about the size and shape of your heart and how well your heart's chambers and valves are working. This procedure takes approximately one hour. There are no restrictions for this procedure.  2. Your physician has requested that you have a [place] Monday 11/14; PT WILL BE OUT OF THE COUNTRY AFTER THIS. For further information please visit [email or web address]. Please follow instruction sheet, as given.  Follow-Up: 6 MONTHS WITH DR. Radford Pax; OUR OFFICE WILL CALL YOU A COUPLE OF MONTHS EARLIER TO MAKE APPT.   Any Other Special Instructions Will Be Listed Below (If Applicable).  SURGERY INFORMATION: YOU WILL NEED TO HOLD XARELTO 17 HOURS BEFORE YOUR SURGERY  If you need a refill on your cardiac medications before your next appointment, please call your pharmacy.

## 2015-08-29 DIAGNOSIS — M25562 Pain in left knee: Secondary | ICD-10-CM | POA: Diagnosis not present

## 2015-08-29 DIAGNOSIS — M1712 Unilateral primary osteoarthritis, left knee: Secondary | ICD-10-CM | POA: Diagnosis not present

## 2015-10-19 HISTORY — PX: JOINT REPLACEMENT: SHX530

## 2015-10-30 DIAGNOSIS — M1712 Unilateral primary osteoarthritis, left knee: Secondary | ICD-10-CM | POA: Diagnosis not present

## 2015-10-30 NOTE — Progress Notes (Signed)
Left message with Tobey Grim, [facility] that patient has been out of country and just returned last night.  She has stress test and eccho scheduled for 11/06/15- cardiac clearance is pending these results  I asked Judeen Hammans to notify Dr Ricki Miller that pre op is 11/10/15 and why.  Asked for call back for verification that message was received

## 2015-10-31 NOTE — Progress Notes (Signed)
Received verification that message was received from Orson Slick at [facility]. Stated she would let [employer] know

## 2015-11-04 ENCOUNTER — Telehealth (HOSPITAL_COMMUNITY): Payer: Self-pay | Admitting: *Deleted

## 2015-11-04 NOTE — Telephone Encounter (Signed)
Patient given detailed instructions per Myocardial Perfusion Study Information Sheet for the test on 11/06/15 at 0945. Patient notified to arrive 15 minutes early and that it is imperative to arrive on time for appointment to keep from having the test rescheduled.  If you need to cancel or reschedule your appointment, please call the office within 24 hours of your appointment. Failure to do so may result in a cancellation of your appointment, and a $50 no show fee. Patient verbalized understanding.Brittinee Risk, Ranae Palms

## 2015-11-05 NOTE — H&P (Signed)
TOTAL KNEE ADMISSION H&P  Patient is being admitted for left total knee arthroplasty.  Subjective:  Chief Complaint:   Left knee primary OA / pain  HPI: [name], 80 y.o. female, has a history of pain and functional disability in the left knee due to arthritis and has failed non-surgical conservative treatments for greater than 12 weeks to include NSAID's and/or analgesics, corticosteriod injections and activity modification.  Onset of symptoms was gradual, starting 2+ years ago with gradually worsening course since that time. The patient noted no past surgery on the left knee(s).  Patient currently rates pain in the left knee(s) at 10 out of 10 with activity. Patient has night pain, worsening of pain with activity and weight bearing, pain that interferes with activities of daily living, pain with passive range of motion, crepitus and joint swelling.  Patient has evidence of periarticular osteophytes and joint space narrowing by imaging studies.  There is no active infection.  Risks, benefits and expectations were discussed with the patient.  Risks including but not limited to the risk of anesthesia, blood clots, nerve damage, blood vessel damage, failure of the prosthesis, infection and up to and including death.  Patient understand the risks, benefits and expectations and wishes to proceed with surgery.   PCP: Jonathon Bellows, MD  D/C Plans:      Home with HHPT  Post-op Meds:       No Rx given  Tranexamic Acid:      To be given - IV   Decadron:      Is to be given  FYI:     Xarelto post-op  (on pre-op)   APAP  ( Patient requests NOTHING stronger)  CPAP   Patient Active Problem List   Diagnosis Date Noted  . Overweight (BMI 25.0-29.9) 01/29/2015  . S/P right THA, AA 01/28/2015  . Chronic diastolic CHF (congestive heart failure), NYHA class 1 ([place])   . OSA (obstructive sleep apnea) 02/25/2014  . Atrial fibrillation, chronic ([place]) 08/20/2013  . Expected blood loss anemia  07/18/2013  . Obese 07/18/2013  . Hypercholesteremia   . Infected postoperative breast seroma   . Breast implant removal status   . S/P carpal tunnel release   . Cancer ([place])   . Shortness of breath   . GERD (gastroesophageal reflux disease)   . Complication of anesthesia   . Hypercholesterolemia   . Menopause   . Breast cancer ([place])   . Lower back pain   . TIA (transient ischemic attack)   . Heart murmur   . Aortic stenosis   . Fibromyalgia   . Arthritis   . Osteoarthrosis, unspecified whether generalized or localized, shoulder region 08/11/2012  . hx: breast cancer, left, invasive lobular, multifocal, receptor + her 2 - 11/15/2011  . HTN (hypertension) 11/13/2011  . Retroperitoneal hematoma 03/19/2011   Past Medical History  Diagnosis Date  . Hypertension   . Arthritis   . GERD (gastroesophageal reflux disease)   . Complication of anesthesia     extremly claustophobic  . Aortic stenosis     mild to moderate AS and mild to mod. AR per last echo 02/25/14:  Mod LVH, EF 55-60%, no RWMA (mean 16 mmHg),   . Chronic atrial fibrillation (HCC) CARDIOLOGIST -- DR Tressia Miners TURNER    controlled  . Chronic diastolic CHF (congestive heart failure), NYHA class 1 ([place])   . Fibromyalgia   . History of TIA (transient ischemic attack)     per scan--  no residual  . Hyperlipidemia   . History of breast cancer no recurrence    dx 2004   s/p  left breast mastectomy w/ node dissection/   no chemo or radiation  . Heart murmur     mild to mod AS, mild to mod. AR and MR, mild  TR per echo 02-25-2014  . OSA on CPAP      severe/  AHI 34/hr  . Chronic anticoagulation   . Urge urinary incontinence   . Wears glasses   . Short of breath on exertion     01-16-15 not a problem now.  . Cancer [facility])     left breast mastectomy(cancer)-surgery only    Past Surgical History  Procedure Laterality Date  . Carpal tunnel release Right 07-08-2004  . Appendectomy  1965  . Tubal ligation  1973  .  Mastectomy, modified radical w/reconstruction  09-16-2003    left breast W/ SLN DISSECTION  . Total shoulder arthroplasty  08/11/2012    Procedure: TOTAL SHOULDER ARTHROPLASTY;  Surgeon: Augustin Schooling, MD;  Location: [place];  Service: Orthopedics;  Laterality: Right;  RIGHT  TOTAL SHOULDER  ARTHROPLASTY   . Total hip arthroplasty Left 07/17/2013    Procedure: LEFT TOTAL HIP ARTHROPLASTY ANTERIOR APPROACH;  Surgeon: Mauri Pole, MD;  Location: [facility];  Service: Orthopedics;  Laterality: Left;  . Abdominoplasty  1987  . Revision breast reconstruction Left 11-11-2004    12-03-2004  DRAINAGE SEREMA LEFT CHEST  . Explantation breast implant and debridement  09-15-2005  . Replace tissue expander left breast and total capsulectomy  08-05-2006  . Colonoscopy with esophagogastroduodenoscopy (egd)  2006  . Transthoracic echocardiogram  02-25-2014    moderate LVH/  ef 55-60%/  mod. to sev. calcification AV with mild to moderate AV stenosis/  mild to mod. AR and MR/ mild PR/   moderate to severe LAE  . Interstim implant placement N/A 08/22/2014    Procedure: INTERSTIM IMPLANT STAGE ONE/TWO;  Surgeon: Reece Packer, MD;  Location: [facility];  Service: Urology;  Laterality: N/A;  . Total hip arthroplasty Right 01/28/2015    Procedure: RIGHT TOTAL HIP ARTHROPLASTY ANTERIOR APPROACH;  Surgeon: Paralee Cancel, MD;  Location: [facility];  Service: Orthopedics;  Laterality: Right;    No prescriptions prior to admission   Allergies  Allergen Reactions  . Beta Adrenergic Blockers Shortness Of Breath  . Betapace [Sotalol Hcl] Shortness Of Breath  . Other     Narcotic pain medications - anxiety attacks  . Oxycodone Shortness Of Breath  . Crestor [Rosuvastatin Calcium] Other (See Comments)    Cough, leg cramps  . Adhesive [Tape] Rash    Adhesive bandaids   . Clonidine Derivatives Palpitations    bradycardia  . Tramadol Anxiety    Social History  Substance Use Topics  . Smoking status:  Former Smoker -- 0.25 packs/day for 10 years    Quit date: 08/09/1985  . Smokeless tobacco: Never Used  . Alcohol Use: No    Family History  Problem Relation Age of Onset  . Heart disease Mother   . Heart disease Father      Review of Systems  Constitutional: Negative.   HENT: Positive for hearing loss.   Eyes: Negative.   Respiratory: Negative.   Cardiovascular: Negative.   Gastrointestinal: Positive for heartburn.  Genitourinary: Positive for urgency and frequency.  Musculoskeletal: Positive for joint pain.  Skin: Negative.   Neurological: Negative.   Endo/Heme/Allergies: Negative.   Psychiatric/Behavioral:  Negative.     Objective:  Physical Exam  Constitutional: She is oriented to person, place, and time. She appears well-developed.  HENT:  Head: Normocephalic.  Eyes: Pupils are equal, round, and reactive to light.  Neck: Neck supple. No JVD present. No tracheal deviation present. No thyromegaly present.  Cardiovascular: Normal rate, regular rhythm and intact distal pulses.   Murmur heard. Respiratory: Effort normal and breath sounds normal. No stridor. No respiratory distress. She has no wheezes.  GI: Soft. There is no tenderness. There is no guarding.  Musculoskeletal:       Left knee: She exhibits decreased range of motion, swelling and bony tenderness. She exhibits no ecchymosis, no deformity, no laceration and no erythema. Tenderness found.  Lymphadenopathy:    She has no cervical adenopathy.  Neurological: She is alert and oriented to person, place, and time.  Skin: Skin is warm and dry.  Psychiatric: She has a normal mood and affect.      Labs:  Estimated body mass index is 27.68 kg/(m^2) as calculated from the following:   Height as of 01/28/15: 5\' 9"  (1.753 m).   Weight as of 01/16/15: 85.049 kg (187 lb 8 oz).   Imaging Review Plain radiographs demonstrate severe degenerative joint disease of the left knee(s). The bone quality appears to be good for  age and reported activity level.  Assessment/Plan:  End stage arthritis, left knee   The patient history, physical examination, clinical judgment of the provider and imaging studies are consistent with end stage degenerative joint disease of the left knee(s) and total knee arthroplasty is deemed medically necessary. The treatment options including medical management, injection therapy arthroscopy and arthroplasty were discussed at length. The risks and benefits of total knee arthroplasty were presented and reviewed. The risks due to aseptic loosening, infection, stiffness, patella tracking problems, thromboembolic complications and other imponderables were discussed. The patient acknowledged the explanation, agreed to proceed with the plan and consent was signed. Patient is being admitted for inpatient treatment for surgery, pain control, PT, OT, prophylactic antibiotics, VTE prophylaxis, progressive ambulation and ADL's and discharge planning. The patient is planning to be discharged home with home health services.     [place] Maahs   PA-C  11/05/2015, 12:02 PM

## 2015-11-06 ENCOUNTER — Encounter: Payer: Self-pay | Admitting: Physician Assistant

## 2015-11-06 ENCOUNTER — Telehealth: Payer: Self-pay | Admitting: *Deleted

## 2015-11-06 ENCOUNTER — Ambulatory Visit (HOSPITAL_COMMUNITY): Payer: Medicare Other | Attending: Cardiovascular Disease

## 2015-11-06 ENCOUNTER — Other Ambulatory Visit: Payer: Self-pay

## 2015-11-06 ENCOUNTER — Ambulatory Visit (HOSPITAL_BASED_OUTPATIENT_CLINIC_OR_DEPARTMENT_OTHER): Payer: Medicare Other

## 2015-11-06 DIAGNOSIS — Z01818 Encounter for other preprocedural examination: Secondary | ICD-10-CM | POA: Insufficient documentation

## 2015-11-06 DIAGNOSIS — I350 Nonrheumatic aortic (valve) stenosis: Secondary | ICD-10-CM

## 2015-11-06 DIAGNOSIS — I5032 Chronic diastolic (congestive) heart failure: Secondary | ICD-10-CM

## 2015-11-06 DIAGNOSIS — I517 Cardiomegaly: Secondary | ICD-10-CM | POA: Insufficient documentation

## 2015-11-06 DIAGNOSIS — I071 Rheumatic tricuspid insufficiency: Secondary | ICD-10-CM | POA: Diagnosis not present

## 2015-11-06 DIAGNOSIS — R9439 Abnormal result of other cardiovascular function study: Secondary | ICD-10-CM | POA: Diagnosis not present

## 2015-11-06 DIAGNOSIS — I371 Nonrheumatic pulmonary valve insufficiency: Secondary | ICD-10-CM | POA: Diagnosis not present

## 2015-11-06 DIAGNOSIS — I10 Essential (primary) hypertension: Secondary | ICD-10-CM | POA: Insufficient documentation

## 2015-11-06 DIAGNOSIS — R0602 Shortness of breath: Secondary | ICD-10-CM | POA: Diagnosis not present

## 2015-11-06 DIAGNOSIS — I352 Nonrheumatic aortic (valve) stenosis with insufficiency: Secondary | ICD-10-CM | POA: Insufficient documentation

## 2015-11-06 LAB — MYOCARDIAL PERFUSION IMAGING
CHL CUP NUCLEAR SDS: 0
LV sys vol: 52 mL
LVDIAVOL: 113 mL
NUC STRESS TID: 0.94
Peak HR: 85 {beats}/min
RATE: 0.36
Rest HR: 72 {beats}/min
SRS: 1
SSS: 1

## 2015-11-06 MED ORDER — TECHNETIUM TC 99M SESTAMIBI GENERIC - CARDIOLITE
10.6000 | Freq: Once | INTRAVENOUS | Status: AC | PRN
  Administered 2015-11-06: 11 via INTRAVENOUS

## 2015-11-06 MED ORDER — TECHNETIUM TC 99M SESTAMIBI GENERIC - CARDIOLITE
32.6000 | Freq: Once | INTRAVENOUS | Status: AC | PRN
  Administered 2015-11-06: 33 via INTRAVENOUS

## 2015-11-06 MED ORDER — REGADENOSON 0.4 MG/5ML IV SOLN
0.4000 mg | Freq: Once | INTRAVENOUS | Status: AC
  Administered 2015-11-06: 0.4 mg via INTRAVENOUS

## 2015-11-06 NOTE — Telephone Encounter (Signed)
Pt has been notified of myoview results by phone with verbal understanding. Pt has been advised Brynda Rim. PA will discuss echo results w/Dr. Radford Pax before making any final recommendations regarding her surgery. Pt states surgery 1/24.

## 2015-11-07 ENCOUNTER — Telehealth: Payer: Self-pay | Admitting: *Deleted

## 2015-11-07 DIAGNOSIS — I350 Nonrheumatic aortic (valve) stenosis: Secondary | ICD-10-CM

## 2015-11-07 NOTE — Telephone Encounter (Signed)
Pt has been notified of echo results and findings by phone w/verbal understanding. Pt understands ok to proceed with surgery. Repeat echo 1 yr, see Dr. Radford Pax in 6 months. Pt said thank you.

## 2015-11-07 NOTE — Telephone Encounter (Signed)
Follow Up  Pt returned call for ECHO results

## 2015-11-07 NOTE — Telephone Encounter (Signed)
Lvm with pt's husband who took mesage for ptcb to discuss echo results.

## 2015-11-10 ENCOUNTER — Encounter (HOSPITAL_COMMUNITY): Payer: Self-pay

## 2015-11-10 ENCOUNTER — Encounter (HOSPITAL_COMMUNITY)
Admission: RE | Admit: 2015-11-10 | Discharge: 2015-11-10 | Disposition: A | Discharge status: Home / Self Care | Payer: Medicare Other | Source: Ambulatory Visit | Attending: Orthopedic Surgery | Admitting: Orthopedic Surgery

## 2015-11-10 HISTORY — DX: Cardiac arrhythmia, unspecified: I49.9

## 2015-11-10 HISTORY — DX: Nocturia: R35.1

## 2015-11-10 HISTORY — DX: Angina pectoris, unspecified: I20.9

## 2015-11-10 HISTORY — DX: Unspecified hearing loss, unspecified ear: H91.90

## 2015-11-10 HISTORY — DX: Diverticulosis of intestine, part unspecified, without perforation or abscess without bleeding: K57.90

## 2015-11-10 LAB — PROTIME-INR
INR: 1.19 (ref 0.00–1.49)
Prothrombin Time: 15.2 seconds (ref 11.6–15.2)

## 2015-11-10 LAB — BASIC METABOLIC PANEL
ANION GAP: 14 (ref 5–15)
BUN: 19 mg/dL (ref 6–20)
CALCIUM: 10.2 mg/dL (ref 8.9–10.3)
CHLORIDE: 106 mmol/L (ref 101–111)
CO2: 26 mmol/L (ref 22–32)
CREATININE: 0.8 mg/dL (ref 0.44–1.00)
GFR calc Af Amer: >60 mL/min (ref >60)
GFR calc non Af Amer: >60 mL/min (ref >60)
GLUCOSE: 90 mg/dL (ref 65–99)
Potassium: 4 mmol/L (ref 3.5–5.1)
Sodium: 146 mmol/L — ABNORMAL HIGH (ref 135–145)

## 2015-11-10 LAB — CBC
HCT: 43.2 % (ref 36.0–46.0)
HEMOGLOBIN: 14.4 g/dL (ref 12.0–15.0)
MCH: 32.7 pg (ref 26.0–34.0)
MCHC: 33.3 g/dL (ref 30.0–36.0)
MCV: 98 fL (ref 78.0–100.0)
Platelets: 247 10*3/uL (ref 150–400)
RBC: 4.41 MIL/uL (ref 3.87–5.11)
RDW: 13.2 % (ref 11.5–15.5)
WBC: 6.8 10*3/uL (ref 4.0–10.5)

## 2015-11-10 LAB — URINALYSIS, ROUTINE W REFLEX MICROSCOPIC
Bilirubin Urine: NEGATIVE
GLUCOSE, UA: NEGATIVE mg/dL
HGB URINE DIPSTICK: NEGATIVE
Ketones, ur: NEGATIVE mg/dL
Leukocytes, UA: NEGATIVE
Nitrite: NEGATIVE
Protein, ur: NEGATIVE mg/dL
Specific Gravity, Urine: 1.006 (ref 1.005–1.030)
pH: 5.5 (ref 5.0–8.0)

## 2015-11-10 LAB — APTT: aPTT: 31 seconds (ref 24–37)

## 2015-11-10 LAB — SURGICAL PCR SCREEN
MRSA, PCR: NEGATIVE
STAPHYLOCOCCUS AUREUS: NEGATIVE

## 2015-11-10 NOTE — Patient Instructions (Signed)
[name]  11/10/2015   Your procedure is scheduled on: Tuesday November 11, 2015   Report to [facility] Main  Entrance take [place]  elevators to 3rd floor to  [place] at 7:00 AM.  Call this number if you have problems the morning of surgery [phone]   Remember: ONLY 1 PERSON MAY GO WITH YOU TO SHORT STAY TO GET  READY MORNING OF [place].  Do not eat food or drink liquids :After Midnight.     Take these medicines the morning of surgery with A SIP OF WATER: Omeprazole                                You may not have any metal on your body including hair pins and              piercings  Do not wear jewelry, make-up, lotions, powders or perfumes, deodorant             Do not wear nail polish.  Do not shave  48 hours prior to surgery.           Do not bring valuables to the hospital. [place].  Contacts, dentures or bridgework may not be worn into surgery.  Leave suitcase in the car. After surgery it may be brought to your room.                Please read over the following fact sheets you were given:MRSA INFORMATION SHEET; INCENTIVE SPIROMETER; BLOOD TRANSFUSION INFORMATION SHEET  _____________________________________________________________________             [facility] - Preparing for Surgery Before surgery, you can play an important role.  Because skin is not sterile, your skin needs to be as free of germs as possible.  You can reduce the number of germs on your skin by washing with CHG (chlorahexidine gluconate) soap before surgery.  CHG is an antiseptic cleaner which kills germs and bonds with the skin to continue killing germs even after washing. Please DO NOT use if you have an allergy to CHG or antibacterial soaps.  If your skin becomes reddened/irritated stop using the CHG and inform your nurse when you arrive at Short Stay. Do not shave (including legs and underarms) for at  least 48 hours prior to the first CHG shower.  You may shave your face/neck. Please follow these instructions carefully:  1.  Shower with CHG Soap the night before surgery and the  morning of Surgery.  2.  If you choose to wash your hair, wash your hair first as usual with your  normal  shampoo.  3.  After you shampoo, rinse your hair and body thoroughly to remove the  shampoo.                           4.  Use CHG as you would any other liquid soap.  You can apply chg directly  to the skin and wash                       Gently with a scrungie or clean washcloth.  5.  Apply the CHG Soap  to your body ONLY FROM THE NECK DOWN.   Do not use on face/ open                           Wound or open sores. Avoid contact with eyes, ears mouth and genitals (private parts).                       Wash face,  Genitals (private parts) with your normal soap.             6.  Wash thoroughly, paying special attention to the area where your surgery  will be performed.  7.  Thoroughly rinse your body with warm water from the neck down.  8.  DO NOT shower/wash with your normal soap after using and rinsing off  the CHG Soap.                9.  Pat yourself dry with a clean towel.            10.  Wear clean pajamas.            11.  Place clean sheets on your bed the night of your first shower and do not  sleep with pets. Day of Surgery : Do not apply any lotions/deodorants the morning of surgery.  Please wear clean clothes to the hospital/surgery center.  FAILURE TO FOLLOW THESE INSTRUCTIONS MAY RESULT IN THE CANCELLATION OF YOUR SURGERY PATIENT SIGNATURE_________________________________  NURSE SIGNATURE__________________________________  ________________________________________________________________________   Adam Phenix  An incentive spirometer is a tool that can help keep your lungs clear and active. This tool measures how well you are filling your lungs with each breath. Taking long deep breaths  may help reverse or decrease the chance of developing breathing (pulmonary) problems (especially infection) following:  A long period of time when you are unable to move or be active. BEFORE THE PROCEDURE   If the spirometer includes an indicator to show your best effort, your nurse or respiratory therapist will set it to a desired goal.  If possible, sit up straight or lean slightly forward. Try not to slouch.  Hold the incentive spirometer in an upright position. INSTRUCTIONS FOR USE   Sit on the edge of your bed if possible, or sit up as far as you can in bed or on a chair.  Hold the incentive spirometer in an upright position.  Breathe out normally.  Place the mouthpiece in your mouth and seal your lips tightly around it.  Breathe in slowly and as deeply as possible, raising the piston or the ball toward the top of the column.  Hold your breath for 3-5 seconds or for as long as possible. Allow the piston or ball to fall to the bottom of the column.  Remove the mouthpiece from your mouth and breathe out normally.  Rest for a few seconds and repeat Steps 1 through 7 at least 10 times every 1-2 hours when you are awake. Take your time and take a few normal breaths between deep breaths.  The spirometer may include an indicator to show your best effort. Use the indicator as a goal to work toward during each repetition.  After each set of 10 deep breaths, practice coughing to be sure your lungs are clear. If you have an incision (the cut made at the time of surgery), support your incision when coughing by placing a pillow or rolled up towels firmly  against it. Once you are able to get out of bed, walk around indoors and cough well. You may stop using the incentive spirometer when instructed by your caregiver.  RISKS AND COMPLICATIONS  Take your time so you do not get dizzy or light-headed.  If you are in pain, you may need to take or ask for pain medication before doing incentive  spirometry. It is harder to take a deep breath if you are having pain. AFTER USE  Rest and breathe slowly and easily.  It can be helpful to keep track of a log of your progress. Your caregiver can provide you with a simple table to help with this. If you are using the spirometer at home, follow these instructions: [place] IF:   You are having difficultly using the spirometer.  You have trouble using the spirometer as often as instructed.  Your pain medication is not giving enough relief while using the spirometer.  You develop fever of 100.5 F (38.1 C) or higher. SEEK IMMEDIATE MEDICAL CARE IF:   You cough up bloody sputum that had not been present before.  You develop fever of 102 F (38.9 C) or greater.  You develop worsening pain at or near the incision site. MAKE SURE YOU:   Understand these instructions.  Will watch your condition.  Will get help right away if you are not doing well or get worse. Document Released: 02/14/2007 Document Revised: 12/27/2011 Document Reviewed: 04/17/2007 ExitCare Patient Information 2014 Exit[employer], [place].   ________________________________________________________________________  WHAT IS A BLOOD TRANSFUSION? Blood Transfusion Information  A transfusion is the replacement of blood or some of its parts. Blood is made up of multiple cells which provide different functions.  Red blood cells carry oxygen and are used for blood loss replacement.  White blood cells fight against infection.  Platelets control bleeding.  Plasma helps clot blood.  Other blood products are available for specialized needs, such as hemophilia or other clotting disorders. BEFORE THE TRANSFUSION  Who gives blood for transfusions?   Healthy volunteers who are fully evaluated to make sure their blood is safe. This is blood bank blood. Transfusion therapy is the safest it has ever been in the practice of medicine. Before blood is taken from a donor, a  complete history is taken to make sure that person has no history of diseases nor engages in risky social behavior (examples are intravenous drug use or sexual activity with multiple partners). The donor's travel history is screened to minimize risk of transmitting infections, such as malaria. The donated blood is tested for signs of infectious diseases, such as HIV and hepatitis. The blood is then tested to be sure it is compatible with you in order to minimize the chance of a transfusion reaction. If you or a relative donates blood, this is often done in anticipation of surgery and is not appropriate for emergency situations. It takes many days to process the donated blood. RISKS AND COMPLICATIONS Although transfusion therapy is very safe and saves many lives, the main dangers of transfusion include:   Getting an infectious disease.  Developing a transfusion reaction. This is an allergic reaction to something in the blood you were given. Every precaution is taken to prevent this. The decision to have a blood transfusion has been considered carefully by your caregiver before blood is given. Blood is not given unless the benefits outweigh the risks. AFTER THE TRANSFUSION  Right after receiving a blood transfusion, you will usually feel much better and  more energetic. This is especially true if your red blood cells have gotten low (anemic). The transfusion raises the level of the red blood cells which carry oxygen, and this usually causes an energy increase.  The nurse administering the transfusion will monitor you carefully for complications. HOME CARE INSTRUCTIONS  No special instructions are needed after a transfusion. You may find your energy is better. Speak with your caregiver about any limitations on activity for underlying diseases you may have. SEEK MEDICAL CARE IF:   Your condition is not improving after your transfusion.  You develop redness or irritation at the intravenous (IV)  site. SEEK IMMEDIATE MEDICAL CARE IF:  Any of the following symptoms occur over the next 12 hours:  Shaking chills.  You have a temperature by mouth above 102 F (38.9 C), not controlled by medicine.  Chest, back, or muscle pain.  People around you feel you are not acting correctly or are confused.  Shortness of breath or difficulty breathing.  Dizziness and fainting.  You get a rash or develop hives.  You have a decrease in urine output.  Your urine turns a dark color or changes to pink, red, or brown. Any of the following symptoms occur over the next 10 days:  You have a temperature by mouth above 102 F (38.9 C), not controlled by medicine.  Shortness of breath.  Weakness after normal activity.  The white part of the eye turns yellow (jaundice).  You have a decrease in the amount of urine or are urinating less often.  Your urine turns a dark color or changes to pink, red, or brown. Document Released: 10/01/2000 Document Revised: 12/27/2011 Document Reviewed: 05/20/2008 [facility] Patient Information 2014 [place], [place].  _______________________________________________________________________

## 2015-11-10 NOTE — Progress Notes (Signed)
Clearance note per chart per Dr Justin Mend with instructions for Xarelto. Pts last dose was on 11/06/2015 with the Xarelto. Echo and Stress test results in epic 11/06/2015 with clearance noted per Dr Radford Pax EKG 08/28/2015/epic

## 2015-11-11 ENCOUNTER — Encounter (HOSPITAL_COMMUNITY): Payer: Self-pay | Admitting: *Deleted

## 2015-11-11 ENCOUNTER — Inpatient Hospital Stay (HOSPITAL_COMMUNITY): Payer: Medicare Other | Admitting: Anesthesiology

## 2015-11-11 ENCOUNTER — Inpatient Hospital Stay (HOSPITAL_COMMUNITY)
Admission: RE | Admit: 2015-11-11 | Discharge: 2015-11-12 | DRG: 470 | Disposition: A | Discharge status: Home Health | Payer: Medicare Other | Source: Ambulatory Visit | Attending: Orthopedic Surgery | Admitting: Orthopedic Surgery

## 2015-11-11 ENCOUNTER — Encounter (HOSPITAL_COMMUNITY)
Admission: RE | Disposition: A | Discharge status: Home Health | Payer: Self-pay | Source: Ambulatory Visit | Attending: Orthopedic Surgery

## 2015-11-11 DIAGNOSIS — Z87891 Personal history of nicotine dependence: Secondary | ICD-10-CM

## 2015-11-11 DIAGNOSIS — M797 Fibromyalgia: Secondary | ICD-10-CM | POA: Diagnosis present

## 2015-11-11 DIAGNOSIS — Z6825 Body mass index (BMI) 25.0-25.9, adult: Secondary | ICD-10-CM | POA: Diagnosis not present

## 2015-11-11 DIAGNOSIS — M1712 Unilateral primary osteoarthritis, left knee: Principal | ICD-10-CM | POA: Diagnosis present

## 2015-11-11 DIAGNOSIS — Z8673 Personal history of transient ischemic attack (TIA), and cerebral infarction without residual deficits: Secondary | ICD-10-CM

## 2015-11-11 DIAGNOSIS — Z7901 Long term (current) use of anticoagulants: Secondary | ICD-10-CM

## 2015-11-11 DIAGNOSIS — M25562 Pain in left knee: Secondary | ICD-10-CM | POA: Diagnosis not present

## 2015-11-11 DIAGNOSIS — M179 Osteoarthritis of knee, unspecified: Secondary | ICD-10-CM | POA: Diagnosis not present

## 2015-11-11 DIAGNOSIS — G4733 Obstructive sleep apnea (adult) (pediatric): Secondary | ICD-10-CM | POA: Diagnosis present

## 2015-11-11 DIAGNOSIS — E663 Overweight: Secondary | ICD-10-CM | POA: Diagnosis present

## 2015-11-11 DIAGNOSIS — K219 Gastro-esophageal reflux disease without esophagitis: Secondary | ICD-10-CM | POA: Diagnosis present

## 2015-11-11 DIAGNOSIS — Z96642 Presence of left artificial hip joint: Secondary | ICD-10-CM | POA: Diagnosis present

## 2015-11-11 DIAGNOSIS — Z853 Personal history of malignant neoplasm of breast: Secondary | ICD-10-CM | POA: Diagnosis not present

## 2015-11-11 DIAGNOSIS — I482 Chronic atrial fibrillation: Secondary | ICD-10-CM | POA: Diagnosis present

## 2015-11-11 DIAGNOSIS — I5032 Chronic diastolic (congestive) heart failure: Secondary | ICD-10-CM | POA: Diagnosis not present

## 2015-11-11 DIAGNOSIS — Z01812 Encounter for preprocedural laboratory examination: Secondary | ICD-10-CM

## 2015-11-11 DIAGNOSIS — Z9012 Acquired absence of left breast and nipple: Secondary | ICD-10-CM | POA: Diagnosis not present

## 2015-11-11 DIAGNOSIS — Z96652 Presence of left artificial knee joint: Secondary | ICD-10-CM

## 2015-11-11 DIAGNOSIS — M659 Synovitis and tenosynovitis, unspecified: Secondary | ICD-10-CM | POA: Diagnosis present

## 2015-11-11 DIAGNOSIS — I10 Essential (primary) hypertension: Secondary | ICD-10-CM | POA: Diagnosis present

## 2015-11-11 DIAGNOSIS — G8918 Other acute postprocedural pain: Secondary | ICD-10-CM | POA: Diagnosis not present

## 2015-11-11 DIAGNOSIS — Z96659 Presence of unspecified artificial knee joint: Secondary | ICD-10-CM

## 2015-11-11 HISTORY — PX: TOTAL KNEE ARTHROPLASTY: SHX125

## 2015-11-11 LAB — TYPE AND SCREEN
ABO/RH(D): B POS
Antibody Screen: NEGATIVE

## 2015-11-11 SURGERY — ARTHROPLASTY, KNEE, TOTAL
Anesthesia: General | Site: Knee | Laterality: Left

## 2015-11-11 MED ORDER — MIDAZOLAM HCL 2 MG/2ML IJ SOLN
INTRAMUSCULAR | Status: AC
  Filled 2015-11-11: qty 2

## 2015-11-11 MED ORDER — PROMETHAZINE HCL 25 MG/ML IJ SOLN: 6.2500 mg | INTRAMUSCULAR | Status: DC | PRN

## 2015-11-11 MED ORDER — DOCUSATE SODIUM 100 MG PO CAPS
100.0000 mg | ORAL_CAPSULE | Freq: Two times a day (BID) | ORAL | Status: DC
  Administered 2015-11-11 – 2015-11-12 (×2): 100 mg via ORAL

## 2015-11-11 MED ORDER — DEXAMETHASONE SODIUM PHOSPHATE 10 MG/ML IJ SOLN: 10.0000 mg | Freq: Once | INTRAMUSCULAR | Status: DC

## 2015-11-11 MED ORDER — SODIUM CHLORIDE 0.9 % IR SOLN
Status: DC | PRN
  Administered 2015-11-11: 1000 mL

## 2015-11-11 MED ORDER — ACETAMINOPHEN 325 MG PO TABS
325.0000 mg | ORAL_TABLET | Freq: Four times a day (QID) | ORAL | Status: DC | PRN
  Administered 2015-11-11 – 2015-11-12 (×2): 650 mg via ORAL
  Filled 2015-11-11 (×2): qty 2

## 2015-11-11 MED ORDER — HYDROCODONE-ACETAMINOPHEN 5-325 MG PO TABS: 1.0000 | ORAL_TABLET | ORAL | Status: DC | PRN

## 2015-11-11 MED ORDER — DEXAMETHASONE SODIUM PHOSPHATE 10 MG/ML IJ SOLN
INTRAMUSCULAR | Status: AC
  Filled 2015-11-11: qty 1

## 2015-11-11 MED ORDER — CELECOXIB 200 MG PO CAPS
200.0000 mg | ORAL_CAPSULE | Freq: Two times a day (BID) | ORAL | Status: DC
  Administered 2015-11-11: 200 mg via ORAL
  Filled 2015-11-11 (×4): qty 1

## 2015-11-11 MED ORDER — HYDROMORPHONE HCL 1 MG/ML IJ SOLN
0.2500 mg | INTRAMUSCULAR | Status: DC | PRN
  Administered 2015-11-11: 0.5 mg via INTRAVENOUS
  Administered 2015-11-11 (×2): 0.25 mg via INTRAVENOUS

## 2015-11-11 MED ORDER — BUPIVACAINE-EPINEPHRINE 0.5% -1:200000 IJ SOLN
INTRAMUSCULAR | Status: AC
  Filled 2015-11-11: qty 1

## 2015-11-11 MED ORDER — BUPIVACAINE-EPINEPHRINE (PF) 0.25% -1:200000 IJ SOLN
INTRAMUSCULAR | Status: AC
  Filled 2015-11-11: qty 30

## 2015-11-11 MED ORDER — BUPIVACAINE-EPINEPHRINE (PF) 0.5% -1:200000 IJ SOLN
INTRAMUSCULAR | Status: DC | PRN
  Administered 2015-11-11: 20 mL

## 2015-11-11 MED ORDER — SUCCINYLCHOLINE CHLORIDE 20 MG/ML IJ SOLN
INTRAMUSCULAR | Status: DC | PRN
  Administered 2015-11-11: 100 mg via INTRAVENOUS

## 2015-11-11 MED ORDER — PHENOL 1.4 % MT LIQD
1.0000 | OROMUCOSAL | Status: DC | PRN
  Filled 2015-11-11: qty 177

## 2015-11-11 MED ORDER — SODIUM CHLORIDE 0.9 % IJ SOLN
INTRAMUSCULAR | Status: DC | PRN
  Administered 2015-11-11: 30 mL

## 2015-11-11 MED ORDER — METHOCARBAMOL 500 MG PO TABS
500.0000 mg | ORAL_TABLET | Freq: Four times a day (QID) | ORAL | Status: DC | PRN
  Administered 2015-11-12: 500 mg via ORAL
  Filled 2015-11-11: qty 1

## 2015-11-11 MED ORDER — CEFAZOLIN SODIUM-DEXTROSE 2-3 GM-% IV SOLR
2.0000 g | INTRAVENOUS | Status: AC
  Administered 2015-11-11: 2 g via INTRAVENOUS

## 2015-11-11 MED ORDER — HYDROMORPHONE HCL 1 MG/ML IJ SOLN
INTRAMUSCULAR | Status: AC
  Filled 2015-11-11: qty 1

## 2015-11-11 MED ORDER — ACETAMINOPHEN 10 MG/ML IV SOLN
1000.0000 mg | Freq: Once | INTRAVENOUS | Status: AC
  Administered 2015-11-11: 1000 mg via INTRAVENOUS

## 2015-11-11 MED ORDER — ONDANSETRON HCL 4 MG/2ML IJ SOLN
INTRAMUSCULAR | Status: DC | PRN
Start: 2015-11-11 — End: 2015-11-11
  Administered 2015-11-11: 4 mg via INTRAVENOUS

## 2015-11-11 MED ORDER — KETOROLAC TROMETHAMINE 30 MG/ML IJ SOLN
INTRAMUSCULAR | Status: AC
  Filled 2015-11-11: qty 1

## 2015-11-11 MED ORDER — DEXTROSE 5 % IV SOLN
500.0000 mg | Freq: Four times a day (QID) | INTRAVENOUS | Status: DC | PRN
  Administered 2015-11-11: 500 mg via INTRAVENOUS
  Filled 2015-11-11 (×2): qty 5

## 2015-11-11 MED ORDER — SODIUM CHLORIDE 0.9 % IJ SOLN
INTRAMUSCULAR | Status: AC
  Filled 2015-11-11: qty 50

## 2015-11-11 MED ORDER — BISACODYL 10 MG RE SUPP: 10.0000 mg | Freq: Every day | RECTAL | Status: DC | PRN

## 2015-11-11 MED ORDER — FERROUS SULFATE 325 (65 FE) MG PO TABS
325.0000 mg | ORAL_TABLET | Freq: Three times a day (TID) | ORAL | Status: DC
  Filled 2015-11-11 (×5): qty 1

## 2015-11-11 MED ORDER — KETOROLAC TROMETHAMINE 30 MG/ML IJ SOLN
INTRAMUSCULAR | Status: DC | PRN
  Administered 2015-11-11: 30 mg via INTRAMUSCULAR

## 2015-11-11 MED ORDER — ROCURONIUM BROMIDE 100 MG/10ML IV SOLN
INTRAVENOUS | Status: AC
  Filled 2015-11-11: qty 1

## 2015-11-11 MED ORDER — TRAMADOL HCL 50 MG PO TABS
50.0000 mg | ORAL_TABLET | Freq: Four times a day (QID) | ORAL | Status: DC | PRN
  Administered 2015-11-11: 100 mg via ORAL
  Filled 2015-11-11: qty 2

## 2015-11-11 MED ORDER — SODIUM CHLORIDE 0.9 % IV SOLN
INTRAVENOUS | Status: DC
  Administered 2015-11-11: 13:00:00 via INTRAVENOUS
  Filled 2015-11-11 (×3): qty 1000

## 2015-11-11 MED ORDER — ONDANSETRON HCL 4 MG/2ML IJ SOLN
INTRAMUSCULAR | Status: AC
  Filled 2015-11-11: qty 2

## 2015-11-11 MED ORDER — 0.9 % SODIUM CHLORIDE (POUR BTL) OPTIME
TOPICAL | Status: DC | PRN
  Administered 2015-11-11: 1000 mL

## 2015-11-11 MED ORDER — METOCLOPRAMIDE HCL 5 MG/ML IJ SOLN: 5.0000 mg | Freq: Three times a day (TID) | INTRAMUSCULAR | Status: DC | PRN

## 2015-11-11 MED ORDER — STERILE WATER FOR IRRIGATION IR SOLN
Status: DC | PRN
  Administered 2015-11-11: 1000 mL

## 2015-11-11 MED ORDER — ONDANSETRON HCL 4 MG/2ML IJ SOLN: 4.0000 mg | Freq: Four times a day (QID) | INTRAMUSCULAR | Status: DC | PRN

## 2015-11-11 MED ORDER — FENTANYL CITRATE (PF) 100 MCG/2ML IJ SOLN
INTRAMUSCULAR | Status: DC | PRN
  Administered 2015-11-11 (×4): 50 ug via INTRAVENOUS

## 2015-11-11 MED ORDER — LACTATED RINGERS IV SOLN
INTRAVENOUS | Status: DC | PRN
  Administered 2015-11-11 (×2): via INTRAVENOUS

## 2015-11-11 MED ORDER — BUPIVACAINE-EPINEPHRINE (PF) 0.25% -1:200000 IJ SOLN
INTRAMUSCULAR | Status: DC | PRN
  Administered 2015-11-11: 30 mL

## 2015-11-11 MED ORDER — ACETAMINOPHEN 650 MG RE SUPP: 650.0000 mg | Freq: Four times a day (QID) | RECTAL | Status: DC | PRN

## 2015-11-11 MED ORDER — TRANEXAMIC ACID 1000 MG/10ML IV SOLN
1000.0000 mg | Freq: Once | INTRAVENOUS | Status: AC
  Administered 2015-11-11: 1000 mg via INTRAVENOUS
  Filled 2015-11-11: qty 10

## 2015-11-11 MED ORDER — LIDOCAINE HCL (CARDIAC) 20 MG/ML IV SOLN
INTRAVENOUS | Status: DC | PRN
  Administered 2015-11-11: 100 mg via INTRAVENOUS

## 2015-11-11 MED ORDER — FENTANYL CITRATE (PF) 100 MCG/2ML IJ SOLN
INTRAMUSCULAR | Status: AC
  Filled 2015-11-11: qty 2

## 2015-11-11 MED ORDER — PHENYLEPHRINE HCL 10 MG/ML IJ SOLN
INTRAMUSCULAR | Status: AC
  Filled 2015-11-11: qty 1

## 2015-11-11 MED ORDER — ALUM & MAG HYDROXIDE-SIMETH 200-200-20 MG/5ML PO SUSP: 30.0000 mL | ORAL | Status: DC | PRN

## 2015-11-11 MED ORDER — RIVAROXABAN 10 MG PO TABS
10.0000 mg | ORAL_TABLET | ORAL | Status: DC
  Administered 2015-11-12: 10 mg via ORAL
  Filled 2015-11-11 (×2): qty 1

## 2015-11-11 MED ORDER — MENTHOL 3 MG MT LOZG: 1.0000 | LOZENGE | OROMUCOSAL | Status: DC | PRN

## 2015-11-11 MED ORDER — DIPHENHYDRAMINE HCL 25 MG PO CAPS: 25.0000 mg | ORAL_CAPSULE | Freq: Four times a day (QID) | ORAL | Status: DC | PRN

## 2015-11-11 MED ORDER — CEFAZOLIN SODIUM-DEXTROSE 2-3 GM-% IV SOLR
2.0000 g | Freq: Four times a day (QID) | INTRAVENOUS | Status: AC
  Administered 2015-11-11 (×2): 2 g via INTRAVENOUS
  Filled 2015-11-11 (×2): qty 50

## 2015-11-11 MED ORDER — METOCLOPRAMIDE HCL 5 MG PO TABS
5.0000 mg | ORAL_TABLET | Freq: Three times a day (TID) | ORAL | Status: DC | PRN
  Filled 2015-11-11: qty 2

## 2015-11-11 MED ORDER — DEXAMETHASONE SODIUM PHOSPHATE 10 MG/ML IJ SOLN
10.0000 mg | Freq: Once | INTRAMUSCULAR | Status: AC
  Administered 2015-11-12: 10 mg via INTRAVENOUS
  Filled 2015-11-11: qty 1

## 2015-11-11 MED ORDER — POLYETHYLENE GLYCOL 3350 17 G PO PACK
17.0000 g | PACK | Freq: Two times a day (BID) | ORAL | Status: DC
  Administered 2015-11-11: 17 g via ORAL

## 2015-11-11 MED ORDER — CEFAZOLIN SODIUM-DEXTROSE 2-3 GM-% IV SOLR
INTRAVENOUS | Status: AC
  Filled 2015-11-11: qty 50

## 2015-11-11 MED ORDER — ONDANSETRON HCL 4 MG PO TABS: 4.0000 mg | ORAL_TABLET | Freq: Four times a day (QID) | ORAL | Status: DC | PRN

## 2015-11-11 MED ORDER — MAGNESIUM CITRATE PO SOLN: 1.0000 | Freq: Once | ORAL | Status: DC | PRN

## 2015-11-11 MED ORDER — PROPOFOL 10 MG/ML IV BOLUS
INTRAVENOUS | Status: DC | PRN
Start: 2015-11-11 — End: 2015-11-11
  Administered 2015-11-11: 20 mg via INTRAVENOUS
  Administered 2015-11-11: 150 mg via INTRAVENOUS

## 2015-11-11 MED ORDER — MIDAZOLAM HCL 5 MG/5ML IJ SOLN
INTRAMUSCULAR | Status: DC | PRN
  Administered 2015-11-11 (×2): 2 mg via INTRAVENOUS

## 2015-11-11 MED ORDER — LIDOCAINE HCL (CARDIAC) 20 MG/ML IV SOLN
INTRAVENOUS | Status: AC
  Filled 2015-11-11: qty 5

## 2015-11-11 MED ORDER — CHLORHEXIDINE GLUCONATE 4 % EX LIQD: 60.0000 mL | Freq: Once | CUTANEOUS | Status: DC

## 2015-11-11 MED ORDER — ACETAMINOPHEN 10 MG/ML IV SOLN
INTRAVENOUS | Status: AC
  Filled 2015-11-11: qty 100

## 2015-11-11 MED ORDER — DEXAMETHASONE SODIUM PHOSPHATE 10 MG/ML IJ SOLN
INTRAMUSCULAR | Status: DC | PRN
  Administered 2015-11-11: 10 mg via INTRAVENOUS

## 2015-11-11 MED ORDER — PROPOFOL 10 MG/ML IV BOLUS
INTRAVENOUS | Status: AC
  Filled 2015-11-11: qty 20

## 2015-11-11 SURGICAL SUPPLY — 54 items
BAG DECANTER FOR FLEXI CONT (MISCELLANEOUS) IMPLANT
BAG SPEC THK2 15X12 ZIP CLS (MISCELLANEOUS) ×1
BAG ZIPLOCK 12X15 (MISCELLANEOUS) ×1 IMPLANT
BANDAGE ACE 6X5 VEL STRL LF (GAUZE/BANDAGES/DRESSINGS) ×2 IMPLANT
BLADE SAW SGTL 13.0X1.19X90.0M (BLADE) ×2 IMPLANT
BOWL SMART MIX CTS (DISPOSABLE) ×2 IMPLANT
CAPT KNEE TOTAL 3 ATTUNE ×1 IMPLANT
CEMENT HV SMART SET (Cement) ×2 IMPLANT
CLOTH BEACON ORANGE TIMEOUT ST (SAFETY) ×2 IMPLANT
CUFF TOURN SGL QUICK 34 (TOURNIQUET CUFF) ×2
CUFF TRNQT CYL 34X4X40X1 (TOURNIQUET CUFF) ×1 IMPLANT
DECANTER SPIKE VIAL GLASS SM (MISCELLANEOUS) ×2 IMPLANT
DRAPE U-SHAPE 47X51 STRL (DRAPES) ×2 IMPLANT
DRSG AQUACEL AG ADV 3.5X10 (GAUZE/BANDAGES/DRESSINGS) ×2 IMPLANT
DURAPREP 26ML APPLICATOR (WOUND CARE) ×4 IMPLANT
ELECT REM PT RETURN 9FT ADLT (ELECTROSURGICAL) ×2
ELECTRODE REM PT RTRN 9FT ADLT (ELECTROSURGICAL) ×1 IMPLANT
GLOVE BIOGEL M 7.0 STRL (GLOVE) IMPLANT
GLOVE BIOGEL PI IND STRL 6.5 (GLOVE) IMPLANT
GLOVE BIOGEL PI IND STRL 7.0 (GLOVE) IMPLANT
GLOVE BIOGEL PI IND STRL 7.5 (GLOVE) ×1 IMPLANT
GLOVE BIOGEL PI IND STRL 8.5 (GLOVE) ×1 IMPLANT
GLOVE BIOGEL PI INDICATOR 6.5 (GLOVE) ×2
GLOVE BIOGEL PI INDICATOR 7.0 (GLOVE)
GLOVE BIOGEL PI INDICATOR 7.5 (GLOVE) ×3
GLOVE BIOGEL PI INDICATOR 8.5 (GLOVE)
GLOVE ECLIPSE 8.0 STRL XLNG CF (GLOVE) ×1 IMPLANT
GLOVE ORTHO TXT STRL SZ7.5 (GLOVE) ×2 IMPLANT
GLOVE SURG SS PI 7.0 STRL IVOR (GLOVE) ×1 IMPLANT
GLOVE SURG SS PI 7.5 STRL IVOR (GLOVE) ×3 IMPLANT
GLOVE SURG SS PI 8.5 STRL IVOR (GLOVE) ×1
GLOVE SURG SS PI 8.5 STRL STRW (GLOVE) IMPLANT
GOWN STRL REUS W/TWL LRG LVL3 (GOWN DISPOSABLE) ×2 IMPLANT
GOWN STRL REUS W/TWL XL LVL3 (GOWN DISPOSABLE) ×2 IMPLANT
HANDPIECE INTERPULSE COAX TIP (DISPOSABLE) ×2
LIQUID BAND (GAUZE/BANDAGES/DRESSINGS) ×2 IMPLANT
MANIFOLD NEPTUNE II (INSTRUMENTS) ×2 IMPLANT
PACK TOTAL KNEE CUSTOM (KITS) ×2 IMPLANT
POSITIONER SURGICAL ARM (MISCELLANEOUS) ×2 IMPLANT
SET HNDPC FAN SPRY TIP SCT (DISPOSABLE) ×1 IMPLANT
SET PAD KNEE POSITIONER (MISCELLANEOUS) ×2 IMPLANT
SUCTION FRAZIER 12FR DISP (SUCTIONS) ×1 IMPLANT
SUT MNCRL AB 4-0 PS2 18 (SUTURE) ×2 IMPLANT
SUT VIC AB 1 CT1 36 (SUTURE) ×2 IMPLANT
SUT VIC AB 2-0 CT1 27 (SUTURE) ×6
SUT VIC AB 2-0 CT1 TAPERPNT 27 (SUTURE) ×3 IMPLANT
SUT VLOC 180 0 24IN GS25 (SUTURE) ×2 IMPLANT
SYR 50ML LL SCALE MARK (SYRINGE) ×2 IMPLANT
TRAY FOLEY BAG SILVER LF 16FR (SET/KITS/TRAYS/PACK) ×1 IMPLANT
TRAY FOLEY W/METER SILVER 14FR (SET/KITS/TRAYS/PACK) ×1 IMPLANT
TRAY FOLEY W/METER SILVER 16FR (SET/KITS/TRAYS/PACK) ×1 IMPLANT
WATER STERILE IRR 1500ML POUR (IV SOLUTION) ×2 IMPLANT
WRAP KNEE MAXI GEL POST OP (GAUZE/BANDAGES/DRESSINGS) ×2 IMPLANT
YANKAUER SUCT BULB TIP 10FT TU (MISCELLANEOUS) ×2 IMPLANT

## 2015-11-11 NOTE — Discharge Instructions (Signed)

## 2015-11-11 NOTE — Progress Notes (Signed)
Pt stated that she will self administer CPAP if she decides to wear it tonight.  Current settings are 12 CMH20 via Pt home nasal pillows.  Pt to notify RT if any assistance is needed.  RT to monitor and assess as needed.

## 2015-11-11 NOTE — Progress Notes (Signed)
AssistedDr. Delma Post with left, ultrasound guided, adductor canal block. Side rails up, monitors on throughout procedure. See vital signs in flow sheet.  Midway through procedure, had positive aspirate of blood,  Heart rate and b/p spiked momentarily.  Monitored pt. Until heart rate down to 100/min. And b/p stabilized, completed block without further incident.  No further complaints. Tolerated Procedure well.

## 2015-11-11 NOTE — Anesthesia Preprocedure Evaluation (Addendum)
Anesthesia Evaluation  Patient identified by MRN, date of birth, ID band Patient awake    Reviewed: Allergy & Precautions, NPO status , Patient's Chart, lab work & pertinent test results  History of Anesthesia Complications (+) history of anesthetic complications  Airway Mallampati: II  TM Distance: >3 FB Neck ROM: Full    Dental no notable dental hx.    Pulmonary shortness of breath, sleep apnea , former smoker,    Pulmonary exam normal breath sounds clear to auscultation       Cardiovascular hypertension, Pt. on medications + angina +CHF  Normal cardiovascular exam+ dysrhythmias Atrial Fibrillation + Valvular Problems/Murmurs AS  Rhythm:Regular Rate:Normal  ECHO 11-06-15: Study Conclusions  - Left ventricle: The cavity size was normal. There was moderate concentric hypertrophy. Systolic function was normal. The estimated ejection fraction was in the range of 55% to 60%. Wall motion was normal; there were no regional wall motion abnormalities. - Aortic valve: Transvalvular velocity was increased. There was moderate stenosis. There was moderate regurgitation. Valve area (VTI): 0.83 cm^2. Valve area (Vmax): 0.79 cm^2. Valve area (Vmean): 0.69 cm^2. - Mitral valve: There was mild regurgitation. - Left atrium: The atrium was severely dilated. - Right ventricle: The cavity size was moderately dilated. Wall thickness was normal. Systolic function was normal. - Right atrium: The atrium was dilated. - Tricuspid valve: There was mild regurgitation. - Pulmonic valve: There was mild regurgitation. - Pulmonary arteries: Systolic pressure was severely increased. PA peak pressure: 53 mm Hg (S). - Inferior vena cava: The vessel was normal in size. The respirophasic diameter changes were in the normal range (= 50%), consistent with normal central venous pressure.    Neuro/Psych TIA Neuromuscular disease negative  psych ROS   GI/Hepatic Neg liver ROS, GERD  ,  Endo/Other  negative endocrine ROS  Renal/GU negative Renal ROS  negative genitourinary   Musculoskeletal negative musculoskeletal ROS (+) Arthritis , Fibromyalgia -  Abdominal   Peds negative pediatric ROS (+)  Hematology  (+) anemia ,   Anesthesia Other Findings   Reproductive/Obstetrics negative OB ROS                            Anesthesia Physical Anesthesia Plan  ASA: III  Anesthesia Plan: General   Post-op Pain Management: GA combined w/ Regional for post-op pain   Induction: Intravenous  Airway Management Planned: Oral ETT  Additional Equipment:   Intra-op Plan:   Post-operative Plan: Extubation in OR  Informed Consent: I have reviewed the patients History and Physical, chart, labs and discussed the procedure including the risks, benefits and alternatives for the proposed anesthesia with the patient or authorized representative who has indicated his/her understanding and acceptance.   Dental advisory given  Plan Discussed with: CRNA  Anesthesia Plan Comments: (Discussed risks of adductor canal block including failure, bleeding, infection, nerve damage.  Adductor canal block does not usually prevent all pain. Specifically, it treats the anterior, but often not the posterior knee. Questions answered.  Patient consents to block.)       Anesthesia Quick Evaluation

## 2015-11-11 NOTE — Interval H&P Note (Signed)
History and Physical Interval Note:  11/11/2015 9:36 AM  [name]  has presented today for surgery, with the diagnosis of LEFT KNEE OA  The various methods of treatment have been discussed with the patient and family. After consideration of risks, benefits and other options for treatment, the patient has consented to  Procedure(s): LEFT TOTAL KNEE ARTHROPLASTY (Left) as a surgical intervention .  The patient's history has been reviewed, patient examined, no change in status, stable for surgery.  I have reviewed the patient's chart and labs.  Questions were answered to the patient's satisfaction.     [name]

## 2015-11-11 NOTE — Op Note (Signed)
NAME:  [name] RECORD NO.:  [id number]                             FACILITY:  [facility]      PHYSICIAN:  Pietro Cassis. Alvan Dame, M.D.  DATE OF BIRTH:  1936-04-28      DATE OF PROCEDURE:  11/11/2015                                     OPERATIVE REPORT         PREOPERATIVE DIAGNOSIS:  Left knee osteoarthritis.      POSTOPERATIVE DIAGNOSIS:  Left knee osteoarthritis.      FINDINGS:  The patient was noted to have complete loss of cartilage and   bone-on-bone arthritis with associated osteophytes in the lateral and patellofemoral compartments of   the knee with a significant synovitis and associated effusion.      PROCEDURE:  Left total knee replacement.      COMPONENTS USED:  DePuy Attune rotating platform posterior stabilized knee   system, a size 4 femur, 4 tibia, size 6 mm PS AOX insert, and 35 anatomic patellar   button.      SURGEON:  Pietro Cassis. Alvan Dame, M.D.      ASSISTANT:  Nehemiah Massed, PA-C.      ANESTHESIA:  General and Regional.      SPECIMENS:  None.      COMPLICATION:  None.      DRAINS:  None  EBL: <50cc      TOURNIQUET TIME:   Total Tourniquet Time Documented: Thigh (Left) - 30 minutes Total: Thigh (Left) - 30 minutes  .      The patient was stable to the recovery room.      INDICATION FOR PROCEDURE:  [name] is a 80 y.o. female patient of   mine.  The patient had been seen, evaluated, and treated conservatively in the   office with medication, activity modification, and injections.  The patient had   radiographic changes of bone-on-bone arthritis with endplate sclerosis and osteophytes noted.      The patient failed conservative measures including medication, injections, and activity modification, and at this point was ready for more definitive measures.   Based on the radiographic changes and failed conservative measures, the patient   decided to proceed with total knee replacement.  Risks of infection,   DVT,  component failure, need for revision surgery, postop course, and   expectations were all   discussed and reviewed.  Consent was obtained for benefit of pain   relief.      PROCEDURE IN DETAIL:  The patient was brought to the operative theater.   Once adequate anesthesia, preoperative antibiotics, 2 gm of Ancef, 1 gm of Tranexamic Acid, and and 10 mg of Decadron administered, the patient was positioned supine with the left thigh tourniquet placed.  The  left lower extremity was prepped and draped in sterile fashion.  A time-   out was performed identifying the patient, planned procedure, and   extremity.      The left lower extremity was placed in the [facility] leg holder.  The leg was   exsanguinated, tourniquet elevated to 250 mmHg.  A midline incision was  made followed by median parapatellar arthrotomy.  Following initial   exposure, attention was first directed to the patella.  Precut   measurement was noted to be 21 mm.  I resected down to 14 mm and used a   35 patellar button to restore patellar height as well as cover the cut   surface.      The lug holes were drilled and a metal shim was placed to protect the   patella from retractors and saw blades.      At this point, attention was now directed to the femur.  The femoral   canal was opened with a drill, irrigated to try to prevent fat emboli.  An   intramedullary rod was passed at 3 degrees valgus, 8 mm of bone was   resected off the distal femur due to hyperextension.  Following this resection, the tibia was   subluxated anteriorly.  Using the extramedullary guide, 2 mm of bone was resected off   the proximal lateral tibia.  We confirmed the gap would be   stable medially and laterally with a size 5 mm insert as well as confirmed   the cut was perpendicular in the coronal plane, checking with an alignment rod.      Once this was done, I sized the femur to be a size 4 in the anterior-   posterior dimension, chose a standard  component based on medial and   lateral dimension.  The size 4 rotation block was then pinned in   position anterior referenced using the C-clamp to set rotation.  The   anterior, posterior, and  chamfer cuts were made without difficulty nor   notching making certain that I was along the anterior cortex to help   with flexion gap stability.      The final box cut was made off the lateral aspect of distal femur.      At this point, the tibia was sized to be a size 4, the size 4 tray was   then pinned in position through the medial third of the tubercle,   drilled, and keel punched.  Trial reduction was now carried with a 4 femur,  4 tibia, a size 6 mm PS insert, and the 35 patella botton.  The knee was brought to   extension, full extension with good flexion stability with the patella   tracking through the trochlea without application of pressure.  Given   all these findings, the femoral lug holes were drilled and then the trial components were removed.  Final components were   opened and cement was mixed.  The knee was irrigated with normal saline   solution and pulse lavage.  The synovial lining was   then injected with 30 cc of 0.25% Marcaine with epinephrine and 1 cc of Toradol plus 30 cc of NS for a   total of 61 cc.      The knee was irrigated.  Final implants were then cemented onto clean and   dried cut surfaces of bone with the knee brought to extension with a size 6 mm trial insert.      Once the cement had fully cured, the excess cement was removed   throughout the knee.  I confirmed I was satisfied with the range of   motion and stability, and the final size 6 mm PS AOX insert was chosen.  It was   placed into the knee.      The tourniquet had been  let down at 30 minutes.  No significant   hemostasis required.  The   extensor mechanism was then reapproximated using #1 Vicryl and #0 V-lock sutures with the knee   in flexion.  The   remaining wound was closed with 2-0  Vicryl and running 4-0 Monocryl.   The knee was cleaned, dried, dressed sterilely using Dermabond and   Aquacel dressing.  The patient was then   brought to recovery room in stable condition, tolerating the procedure   well.   Please note that Physician Assistant, Nehemiah Massed, PA-C, was present for the entirety of the case, and was utilized for pre-operative positioning, peri-operative retractor management, general facilitation of the procedure.  He was also utilized for primary wound closure at the end of the case.              Pietro Cassis Alvan Dame, M.D.    11/11/2015 10:27 AM

## 2015-11-11 NOTE — Anesthesia Procedure Notes (Addendum)
Anesthesia Regional Block:  Adductor canal block  Pre-Anesthetic Checklist: ,, timeout performed, Correct Patient, Correct Site, Correct Laterality, Correct Procedure, Correct Position, site marked, Risks and benefits discussed,  Surgical consent,  Pre-op evaluation,  At surgeon's request and post-op pain management  Laterality: Lower and Left  Prep: chloraprep       Needles:  Injection technique: Single-shot  Needle Type: Echogenic Needle      Needle Gauge: 21 and 21 G    Additional Needles:  Procedures: ultrasound guided (picture in chart) Adductor canal block  Nerve Stimulator or Paresthesia:  Response: 0.6 mA,   Additional Responses:   Narrative:   Events: blood aspirated  Performed by: Personally  Anesthesiologist: Franne Grip  Additional Notes: No pain on injection. No increased resistance to injection. Meaningful verbal contact maintained throughout block placement.   Procedure Name: Intubation Date/Time: 11/11/2015 9:00 AM Performed by: Lind Covert Pre-anesthesia Checklist: Patient identified, Timeout performed, Emergency Drugs available, Suction available and Patient being monitored Patient Re-evaluated:Patient Re-evaluated prior to inductionOxygen Delivery Method: Circle system utilized Preoxygenation: Pre-oxygenation with 100% oxygen Intubation Type: IV induction Laryngoscope Size: Mac and 4 Grade View: Grade I Tube type: Oral Tube size: 7.5 mm Number of attempts: 1 Airway Equipment and Method: Stylet Placement Confirmation: ETT inserted through vocal cords under direct vision,  positive ETCO2 and breath sounds checked- equal and bilateral Secured at: 22 cm Tube secured with: Tape Dental Injury: Teeth and Oropharynx as per pre-operative assessment

## 2015-11-11 NOTE — Anesthesia Postprocedure Evaluation (Signed)
Anesthesia Post Note  Patient: [name]  Procedure(s) Performed: Procedure(s) (LRB): LEFT TOTAL KNEE ARTHROPLASTY (Left)  Patient location during evaluation: PACU Anesthesia Type: General and Regional Level of consciousness: awake and alert Pain management: pain level controlled Vital Signs Assessment: post-procedure vital signs reviewed and stable Respiratory status: spontaneous breathing, nonlabored ventilation, respiratory function stable and patient connected to nasal cannula oxygen Cardiovascular status: blood pressure returned to baseline and stable Postop Assessment: no signs of nausea or vomiting Anesthetic complications: no Comments: OSA precautions    Last Vitals:  Filed Vitals:   11/11/15 1200 11/11/15 1225  BP: 159/70 151/67  Pulse: 54 59  Temp: 36.5 C 36.6 C  Resp: 11 14    Last Pain:  Filed Vitals:   11/11/15 1236  PainSc: 2                  Paton Crum J

## 2015-11-11 NOTE — Transfer of Care (Signed)
Immediate Anesthesia Transfer of Care Note  Patient: [name]  Procedure(s) Performed: Procedure(s): LEFT TOTAL KNEE ARTHROPLASTY (Left)  Patient Location: PACU  Anesthesia Type:General  Level of Consciousness: awake, alert  and patient cooperative  Airway & Oxygen Therapy: Patient Spontanous Breathing and Patient connected to nasal cannula oxygen  Post-op Assessment: Report given to RN and Post -op Vital signs reviewed and stable  Post vital signs: Reviewed and stable  Last Vitals:  Filed Vitals:   11/11/15 0654 11/11/15 1100  BP: 172/84   Pulse: 64 73  Temp: 36.3 C 36.4 C  Resp: 16     Complications: No apparent anesthesia complications

## 2015-11-11 NOTE — Evaluation (Signed)
Physical Therapy Evaluation Patient Details Name: [name] MRN: [id number] DOB: [birth year] Today's Date: 11/11/2015   History of Present Illness  Left TKA with adductor canal block  Clinical Impression  The patient ambulated very well, minimal pain/discomfort. Patient will benefit from PT to address the problems listed in the note below.    Follow Up Recommendations Home health PT;Supervision - Intermittent    Equipment Recommendations  None recommended by PT    Recommendations for Other Services       Precautions / Restrictions Precautions Precautions: Knee      Mobility  Bed Mobility Overal bed mobility: Needs Assistance Bed Mobility: Supine to Sit;Sit to Supine     Supine to sit: Min guard     General bed mobility comments: cues for technique, did not require support of the L leg, maintained without assist.  Transfers Overall transfer level: Needs assistance Equipment used: Rolling walker (2 wheeled) Transfers: Sit to/from Stand Sit to Stand: Min assist         General transfer comment: cues for hand placement and L leg   Ambulation/Gait Ambulation/Gait assistance: Min assist Ambulation Distance (Feet): 180 Feet Assistive device: Rolling walker (2 wheeled) Gait Pattern/deviations: Step-through pattern     General Gait Details: smoothe gait, step through  [employer] Mobility    Modified Rankin (Stroke Patients Only)       Balance                                             Pertinent Vitals/Pain Pain Assessment: 0-10 Pain Score: 2  Pain Location: L knee  Pain Descriptors / Indicators: Sore Pain Intervention(s): Premedicated before session;Ice applied    Home Living Family/patient expects to be discharged to:: Private residence Living Arrangements: Spouse/significant other Available Help at Discharge: Family Type of Home: House Home Access: Stairs to enter Entrance Stairs-Rails:  Right;Left;Can reach both Entrance Stairs-Number of Steps: 3 Home Layout: Two level;Able to live on main level with bedroom/bathroom Home Equipment: Gilford Rile - 2 wheels;Bedside commode      Prior Function Level of Independence: Independent               Hand Dominance        Extremity/Trunk Assessment   Upper Extremity Assessment: Overall WFL for tasks assessed           Lower Extremity Assessment: LLE deficits/detail   LLE Deficits / Details: + SLR. Knee flexion to 60degrees  Cervical / Trunk Assessment: Normal  Communication   Communication: No difficulties  Cognition Arousal/Alertness: Awake/alert Behavior During Therapy: WFL for tasks assessed/performed Overall Cognitive Status: Within Functional Limits for tasks assessed                      General Comments      Exercises        Assessment/Plan    PT Assessment Patient needs continued PT services  PT Diagnosis Difficulty walking   PT Problem List Decreased strength;Decreased range of motion;Decreased activity tolerance;Decreased safety awareness;Decreased knowledge of precautions;Decreased knowledge of use of DME  PT Treatment Interventions DME instruction;Gait training;Stair training;Functional mobility training;Therapeutic activities;Therapeutic exercise;Patient/family education   PT Goals (Current goals can be found in the Care Plan section) Acute Rehab PT Goals Patient Stated Goal: to go home PT Goal Formulation: With patient  Time For Goal Achievement: 11/14/15 Potential to Achieve Goals: Good    Frequency 7X/week   Barriers to discharge        Co-evaluation               End of Session   Activity Tolerance: Patient tolerated treatment well Patient left: in bed;with bed alarm set;with call bell/phone within reach Nurse Communication: Mobility status         Time: [id number] PT Time Calculation (min) (ACUTE ONLY): 16 min   Charges:   PT Evaluation $PT Eval Low  Complexity: 1 Procedure     PT G CodesClaretha Cooper 11/11/2015, 5:29 PM Tresa Endo PT [phone]

## 2015-11-12 LAB — CBC
HCT: 35.4 % — ABNORMAL LOW (ref 36.0–46.0)
HEMOGLOBIN: 12.4 g/dL (ref 12.0–15.0)
MCH: 33 pg (ref 26.0–34.0)
MCHC: 35 g/dL (ref 30.0–36.0)
MCV: 94.1 fL (ref 78.0–100.0)
Platelets: 180 10*3/uL (ref 150–400)
RBC: 3.76 MIL/uL — ABNORMAL LOW (ref 3.87–5.11)
RDW: 13 % (ref 11.5–15.5)
WBC: 11 10*3/uL — AB (ref 4.0–10.5)

## 2015-11-12 LAB — BASIC METABOLIC PANEL
ANION GAP: 8 (ref 5–15)
BUN: 20 mg/dL (ref 6–20)
CALCIUM: 9.3 mg/dL (ref 8.9–10.3)
CO2: 22 mmol/L (ref 22–32)
CREATININE: 0.68 mg/dL (ref 0.44–1.00)
Chloride: 107 mmol/L (ref 101–111)
GFR calc non Af Amer: >60 mL/min (ref >60)
Glucose, Bld: 133 mg/dL — ABNORMAL HIGH (ref 65–99)
Potassium: 5 mmol/L (ref 3.5–5.1)
SODIUM: 137 mmol/L (ref 135–145)

## 2015-11-12 MED ORDER — FERROUS SULFATE 325 (65 FE) MG PO TABS: 325.0000 mg | ORAL_TABLET | Freq: Three times a day (TID) | ORAL | Status: DC

## 2015-11-12 MED ORDER — DOCUSATE SODIUM 100 MG PO CAPS: 100.0000 mg | ORAL_CAPSULE | Freq: Two times a day (BID) | ORAL | Status: DC

## 2015-11-12 MED ORDER — TRAMADOL HCL 50 MG PO TABS: 50.0000 mg | ORAL_TABLET | Freq: Four times a day (QID) | ORAL | Status: DC | PRN

## 2015-11-12 MED ORDER — POLYETHYLENE GLYCOL 3350 17 G PO PACK: 17.0000 g | PACK | Freq: Two times a day (BID) | ORAL | Status: DC

## 2015-11-12 NOTE — Progress Notes (Signed)
OT Cancellation Note  Patient Details Name: [name] MRN: [id number] DOB: 1936-05-24   Cancelled Treatment:    Reason Eval/Treat Not Completed: OT screened, no needs identified, will sign off. Spoke to physical therapist and patient. Patient has had other joint replacements. She has all necessary DME and reports she is familiar with ADL techniques from prior surgeries. She feels she does not need OT at this time. OT will sign off.  Simmie Camerer A 11/12/2015, 8:47 AM

## 2015-11-12 NOTE — Progress Notes (Signed)
Physical Therapy Treatment Patient Details Name: CHARLOTTE MARCHEWKA MRN: [id number] DOB: 06-29-1936 Today's Date: 11/12/2015    History of Present Illness RTKA with adductor canal block  Patient is progressing very well ready for DC.  PT Comments      Follow Up Recommendations  Home health PT;Supervision - Intermittent     Equipment Recommendations  None recommended by PT    Recommendations for Other Services       Precautions / Restrictions Precautions Precautions: Knee    Mobility  Bed Mobility Overal bed mobility: Modified Independent                Transfers   Equipment used: Rolling walker (2 wheeled) Transfers: Sit to/from Stand Sit to Stand: Supervision         General transfer comment: cues for hand placement and L leg   Ambulation/Gait Ambulation/Gait assistance: Supervision Ambulation Distance (Feet): 400 Feet Assistive device: Rolling walker (2 wheeled)       General Gait Details: smoothe gait, step through   Stairs Stairs: Yes Stairs assistance: Supervision Stair Management: Two rails;Step to pattern;Forwards Number of Stairs: 4 General stair comments: patient  tolerated well, safe.  Wheelchair Mobility    Modified Rankin (Stroke Patients Only)       Balance                                    Cognition Arousal/Alertness: Awake/alert                          Exercises Total Joint Exercises Ankle Circles/Pumps: AROM;10 reps;Both;Supine Quad Sets: AROM;10 reps;Both;Supine Short Arc Quad: AROM;Left;10 reps Heel Slides: AROM;Left;10 reps;Supine Hip ABduction/ADduction: AROM;Left;10 reps;Supine Straight Leg Raises: AROM;Left;Supine Long Arc Quad: AROM;Left;10 reps;Seated Knee Flexion: AROM;Left;Seated Goniometric ROM: 10-80 L knee flex.    General Comments        Pertinent Vitals/Pain Pain Score: 2  Pain Descriptors / Indicators: Sore Pain Intervention(s): Premedicated before session;Ice  applied    Home Living                      Prior Function            PT Goals (current goals can now be found in the care plan section) Acute Rehab PT Goals Potential to Achieve Goals: Good Progress towards PT goals: Progressing toward goals    Frequency       PT Plan Current plan remains appropriate    Co-evaluation             End of Session   Activity Tolerance: Patient tolerated treatment well Patient left: in chair     Time: 0902-0922 PT Time Calculation (min) (ACUTE ONLY): 20 min  Charges:  $Gait Training: 8-22 mins                    G Codes:      Claretha Cooper 11/12/2015, 1:38 PM

## 2015-11-12 NOTE — Progress Notes (Signed)
     Subjective: 1 Day Post-Op Procedure(s) (LRB): LEFT TOTAL KNEE ARTHROPLASTY (Left)   Patient reports pain as mild, pain controlled. No events throughout the night.  Feels that she is doing quite well and ready to be discharged home.  Objective:   VITALS:   Filed Vitals:   11/12/15 0136 11/12/15 0555  BP: 138/76 153/70  Pulse: 80 88  Temp: 98.3 F (36.8 C) 98 F (36.7 C)  Resp: 14 16    Dorsiflexion/Plantar flexion intact Incision: dressing C/D/I No cellulitis present Compartment soft  LABS  Recent Labs  11/10/15 0920 11/12/15 0430  HGB 14.4 12.4  HCT 43.2 35.4*  WBC 6.8 11.0*  PLT 247 180     Recent Labs  11/10/15 0920 11/12/15 0430  NA 146* 137  K 4.0 5.0  BUN 19 20  CREATININE 0.80 0.68  GLUCOSE 90 133*     Assessment/Plan: 1 Day Post-Op Procedure(s) (LRB): LEFT TOTAL KNEE ARTHROPLASTY (Left) Foley cath d/c'ed Advance diet Up with therapy D/C IV fluids Discharge home with home health  Overweight (BMI 25-29.9) Estimated body mass index is 27.72 kg/(m^2) as calculated from the following:   Height as of this encounter: 5\' 7"  (1.702 m).   Weight as of this encounter: 80.287 kg (177 lb). Patient also counseled that weight may inhibit the healing process Patient counseled that losing weight will help with future health issues       [place]. Kaylia Winborne   PAC  11/12/2015, 8:41 AM

## 2015-11-12 NOTE — Care Management Note (Signed)
Case Management Note  Patient Details  Name: [name] MRN: [id number] Date of Birth: [birth year]  Subjective/Objective:                  LEFT TOTAL KNEE ARTHROPLASTY (Left)  Action/Plan: Discharge planning Expected Discharge Date:  11/12/15               Expected Discharge Plan:  [place]  In-House Referral:     Discharge planning Services  CM Consult  Post Acute Care Choice:    Choice offered to:     DME Arranged:  N/A DME Agency:  NA  HH Arranged:  PT HH Agency:  [facility]  Status of Service:  Completed, signed off  Medicare Important Message Given:    Date Medicare IM Given:    Medicare IM give by:    Date Additional Medicare IM Given:    Additional Medicare Important Message give by:     If discussed at [place] of Stay Meetings, dates discussed:    Additional Comments: CM met with pt in room to offer choice of home health agency.  Pt chooses [facility] to render HHPT.  Pt has DME from previous surgery.  Referral given to [facility] rep, Tim (on unit).  No other CM needs were communcated. Dellie Catholic, RN 11/12/2015, 10:09 AM

## 2015-11-13 DIAGNOSIS — Z471 Aftercare following joint replacement surgery: Secondary | ICD-10-CM | POA: Diagnosis not present

## 2015-11-13 DIAGNOSIS — M797 Fibromyalgia: Secondary | ICD-10-CM | POA: Diagnosis not present

## 2015-11-13 DIAGNOSIS — I482 Chronic atrial fibrillation: Secondary | ICD-10-CM | POA: Diagnosis not present

## 2015-11-13 DIAGNOSIS — M1990 Unspecified osteoarthritis, unspecified site: Secondary | ICD-10-CM | POA: Diagnosis not present

## 2015-11-13 DIAGNOSIS — I110 Hypertensive heart disease with heart failure: Secondary | ICD-10-CM | POA: Diagnosis not present

## 2015-11-13 DIAGNOSIS — I5032 Chronic diastolic (congestive) heart failure: Secondary | ICD-10-CM | POA: Diagnosis not present

## 2015-11-14 DIAGNOSIS — I482 Chronic atrial fibrillation: Secondary | ICD-10-CM | POA: Diagnosis not present

## 2015-11-14 DIAGNOSIS — I110 Hypertensive heart disease with heart failure: Secondary | ICD-10-CM | POA: Diagnosis not present

## 2015-11-14 DIAGNOSIS — Z471 Aftercare following joint replacement surgery: Secondary | ICD-10-CM | POA: Diagnosis not present

## 2015-11-14 DIAGNOSIS — M797 Fibromyalgia: Secondary | ICD-10-CM | POA: Diagnosis not present

## 2015-11-14 DIAGNOSIS — M1990 Unspecified osteoarthritis, unspecified site: Secondary | ICD-10-CM | POA: Diagnosis not present

## 2015-11-14 DIAGNOSIS — I5032 Chronic diastolic (congestive) heart failure: Secondary | ICD-10-CM | POA: Diagnosis not present

## 2015-11-17 DIAGNOSIS — I110 Hypertensive heart disease with heart failure: Secondary | ICD-10-CM | POA: Diagnosis not present

## 2015-11-17 DIAGNOSIS — Z471 Aftercare following joint replacement surgery: Secondary | ICD-10-CM | POA: Diagnosis not present

## 2015-11-17 DIAGNOSIS — I5032 Chronic diastolic (congestive) heart failure: Secondary | ICD-10-CM | POA: Diagnosis not present

## 2015-11-17 DIAGNOSIS — M797 Fibromyalgia: Secondary | ICD-10-CM | POA: Diagnosis not present

## 2015-11-17 DIAGNOSIS — I482 Chronic atrial fibrillation: Secondary | ICD-10-CM | POA: Diagnosis not present

## 2015-11-17 DIAGNOSIS — M1990 Unspecified osteoarthritis, unspecified site: Secondary | ICD-10-CM | POA: Diagnosis not present

## 2015-11-17 NOTE — Discharge Summary (Signed)
Physician Discharge Summary  Patient ID: HEAHTER BILEK MRN: [id number] DOB/AGE: 1935/11/29 80 y.o.  Admit date: 11/11/2015 Discharge date: 11/12/2015   Procedures:  Procedure(s) (LRB): LEFT TOTAL KNEE ARTHROPLASTY (Left)  Attending Physician:  Dr. Paralee Cancel   Admission Diagnoses:   Left knee primary OA / pain  Discharge Diagnoses:  Principal Problem:   S/P left TKA Active Problems:   Overweight (BMI 25.0-29.9)  Past Medical History  Diagnosis Date  . Hypertension   . Arthritis   . GERD (gastroesophageal reflux disease)   . Aortic stenosis     a. Echo 02/25/14:  Mod LVH, EF 55-60%, no RWMA (mean 16 mmHg) // b. Echo 1/17: moderate LVH, EF 55-60%, normal wall motion, moderate aortic stenosis (mean 32 mmHg, peak 52 mmHg), moderate AI, mild MR, severe LAE, normal RVSF, RAE, mild TR, mild PI, PASP 53 mmHg  . Chronic atrial fibrillation (HCC) CARDIOLOGIST -- DR Tressia Miners TURNER    controlled  . Chronic diastolic CHF (congestive heart failure), NYHA class 1 ([place])   . Fibromyalgia   . History of TIA (transient ischemic attack)     per scan--  no residual  . Hyperlipidemia   . History of breast cancer no recurrence    dx 2004   s/p  left breast mastectomy w/ node dissection/   no chemo or radiation  . Heart murmur     mild to mod AS, mild to mod. AR and MR, mild  TR per echo 02-25-2014  . OSA on CPAP      severe/  AHI 34/hr  . Chronic anticoagulation   . Urge urinary incontinence   . Wears glasses   . Short of breath on exertion     01-16-15 not a problem now.  . Cancer [facility])     left breast mastectomy(cancer)-surgery only  . History of nuclear stress test     Myoview 1/17: EF 54%, apical thinning, no ischemia, low risk  . Complication of anesthesia     extremely claustophobic(only occurs with narcotic medications as stated per pt)  . Anginal pain ([place])     pt had with A Fib but has not occurred since beginning Xarelto  . Dysrhythmia   . Diverticulosis   . Hard of  hearing     wears hearing aides   . Nocturia     HPI:    [name], 80 y.o. female, has a history of pain and functional disability in the left knee due to arthritis and has failed non-surgical conservative treatments for greater than 12 weeks to include NSAID's and/or analgesics, corticosteriod injections and activity modification. Onset of symptoms was gradual, starting 2+ years ago with gradually worsening course since that time. The patient noted no past surgery on the left knee(s). Patient currently rates pain in the left knee(s) at 10 out of 10 with activity. Patient has night pain, worsening of pain with activity and weight bearing, pain that interferes with activities of daily living, pain with passive range of motion, crepitus and joint swelling. Patient has evidence of periarticular osteophytes and joint space narrowing by imaging studies. There is no active infection. Risks, benefits and expectations were discussed with the patient. Risks including but not limited to the risk of anesthesia, blood clots, nerve damage, blood vessel damage, failure of the prosthesis, infection and up to and including death. Patient understand the risks, benefits and expectations and wishes to proceed with surgery.   PCP: Jonathon Bellows, MD   Discharged Condition:  good  Hospital Course:  Patient underwent the above stated procedure on 11/11/2015. Patient tolerated the procedure well and brought to the recovery room in good condition and subsequently to the floor.  POD #1 BP: 153/70 ; Pulse: 88 ; Temp: 98 F (36.7 C) ; Resp: 16 Patient reports pain as mild, pain controlled. No events throughout the night. Feels that she is doing quite well and ready to be discharged home. Dorsiflexion/plantar flexion intact, incision: dressing C/D/I, no cellulitis present and compartment soft.   LABS  Basename    HGB     12.4  HCT     35.4    Discharge Exam: General appearance: alert, cooperative and no  distress Extremities: Homans sign is negative, no sign of DVT, no edema, redness or tenderness in the calves or thighs and no ulcers, gangrene or trophic changes  Disposition: Home with follow up in 2 weeks   Follow-up Information    Follow up with Mauri Pole, MD. Schedule an appointment as soon as possible for a visit in 2 weeks.   Specialty:  Orthopedic Surgery   Contact information:   [address] [place] [address] [phone]       Follow up with [facility].   Why:  home health physical therapy   Contact information:   [place] [address] [phone]       Discharge Instructions    Call MD / Call 911    Complete by:  As directed   If you experience chest pain or shortness of breath, CALL 911 and be transported to the hospital emergency room.  If you develope a fever above 101 F, pus (white drainage) or increased drainage or redness at the wound, or calf pain, call your surgeon's office.     Change dressing    Complete by:  As directed   Maintain surgical dressing until follow up in the clinic. If the edges start to pull up, may reinforce with tape. If the dressing is no longer working, may remove and cover with gauze and tape, but must keep the area dry and clean.  Call with any questions or concerns.     Change dressing    Complete by:  As directed   Maintain surgical dressing until follow up in the clinic. If the edges start to pull up, may reinforce with tape. If the dressing is no longer working, may remove and cover with gauze and tape, but must keep the area dry and clean.  Call with any questions or concerns.     Constipation Prevention    Complete by:  As directed   Drink plenty of fluids.  Prune juice may be helpful.  You may use a stool softener, such as Colace (over the counter) 100 mg twice a day.  Use MiraLax (over the counter) for constipation as needed.     Diet - low sodium heart healthy     Complete by:  As directed      Discharge instructions    Complete by:  As directed   Maintain surgical dressing until follow up in the clinic. If the edges start to pull up, may reinforce with tape. If the dressing is no longer working, may remove and cover with gauze and tape, but must keep the area dry and clean.  Follow up in 2 weeks at [facility]. Call with any questions or concerns.     Increase activity slowly as tolerated  Complete by:  As directed   Weight bearing as tolerated with assist device (walker, cane, etc) as directed, use it as long as suggested by your surgeon or therapist, typically at least 4-6 weeks.     TED hose    Complete by:  As directed   Use stockings (TED hose) for 2 weeks on both leg(s).  You may remove them at night for sleeping.     TED hose    Complete by:  As directed   Use stockings (TED hose) for 2 weeks on both leg(s).  You may remove them at night for sleeping.             Medication List    TAKE these medications        acetaminophen 325 MG tablet  Commonly known as:  TYLENOL  Take 1-2 tablets (325-650 mg total) by mouth every 4 (four) hours as needed for mild pain or moderate pain.     amoxicillin 500 MG capsule  Commonly known as:  AMOXIL  TAKE 4 CAPSULES BY MOUTH 1 HOUR BEFORE DENTAL APPT FOR PREMED     atorvastatin 40 MG tablet  Commonly known as:  LIPITOR  Take 40 mg by mouth daily.     calcium-vitamin D 500-200 MG-UNIT tablet  Take 1 tablet by mouth daily.     clobetasol ointment 0.05 %  Commonly known as:  TEMOVATE  Apply 1 application topically 2 (two) times daily as needed (for skin irritation).     diltiazem 360 MG 24 hr capsule  Commonly known as:  TIAZAC  Take 360 mg by mouth every evening.     docusate sodium 100 MG capsule  Commonly known as:  COLACE  Take 1 capsule (100 mg total) by mouth 2 (two) times daily.     ferrous sulfate 325 (65 FE) MG tablet  Take 1 tablet (325 mg total) by mouth 3 (three)  times daily after meals.     furosemide 20 MG tablet  Commonly known as:  LASIX  TAKE 1 TABLET (20 MG TOTAL) BY MOUTH DAILY.     glucosamine-chondroitin 500-400 MG tablet  Take 1 tablet by mouth 2 (two) times daily.     losartan 50 MG tablet  Commonly known as:  COZAAR  Take 50 mg by mouth daily.     Melatonin 10 MG Tabs  Take 1 tablet by mouth at bedtime as needed (for sleep).     multivitamins with iron Tabs tablet  Take 1 tablet by mouth daily.     nitroGLYCERIN 0.4 MG SL tablet  Commonly known as:  NITROSTAT  Place 0.4 mg under the tongue every 5 (five) minutes as needed. For chest pain     omeprazole 20 MG capsule  Commonly known as:  PRILOSEC  Take 20 mg by mouth every morning.     polyethylene glycol packet  Commonly known as:  MIRALAX / GLYCOLAX  Take 17 g by mouth 2 (two) times daily.     rivaroxaban 20 MG Tabs tablet  Commonly known as:  XARELTO  Take 20 mg by mouth daily with supper.     traMADol 50 MG tablet  Commonly known as:  ULTRAM  Take 1-2 tablets (50-100 mg total) by mouth every 6 (six) hours as needed for moderate pain or severe pain.     triamcinolone cream 0.1 %  Commonly known as:  KENALOG  Apply 1 application topically as needed (for skin irritation).  Signed: [place] Pugh. Shakerra Red   PA-C  11/17/2015, 3:02 PM

## 2015-11-19 DIAGNOSIS — M1990 Unspecified osteoarthritis, unspecified site: Secondary | ICD-10-CM | POA: Diagnosis not present

## 2015-11-19 DIAGNOSIS — Z471 Aftercare following joint replacement surgery: Secondary | ICD-10-CM | POA: Diagnosis not present

## 2015-11-19 DIAGNOSIS — I110 Hypertensive heart disease with heart failure: Secondary | ICD-10-CM | POA: Diagnosis not present

## 2015-11-19 DIAGNOSIS — I5032 Chronic diastolic (congestive) heart failure: Secondary | ICD-10-CM | POA: Diagnosis not present

## 2015-11-19 DIAGNOSIS — M797 Fibromyalgia: Secondary | ICD-10-CM | POA: Diagnosis not present

## 2015-11-19 DIAGNOSIS — I482 Chronic atrial fibrillation: Secondary | ICD-10-CM | POA: Diagnosis not present

## 2015-11-21 DIAGNOSIS — I482 Chronic atrial fibrillation: Secondary | ICD-10-CM | POA: Diagnosis not present

## 2015-11-21 DIAGNOSIS — M797 Fibromyalgia: Secondary | ICD-10-CM | POA: Diagnosis not present

## 2015-11-21 DIAGNOSIS — M1990 Unspecified osteoarthritis, unspecified site: Secondary | ICD-10-CM | POA: Diagnosis not present

## 2015-11-21 DIAGNOSIS — I5032 Chronic diastolic (congestive) heart failure: Secondary | ICD-10-CM | POA: Diagnosis not present

## 2015-11-21 DIAGNOSIS — Z471 Aftercare following joint replacement surgery: Secondary | ICD-10-CM | POA: Diagnosis not present

## 2015-11-21 DIAGNOSIS — I110 Hypertensive heart disease with heart failure: Secondary | ICD-10-CM | POA: Diagnosis not present

## 2015-11-24 DIAGNOSIS — M1712 Unilateral primary osteoarthritis, left knee: Secondary | ICD-10-CM | POA: Diagnosis not present

## 2015-11-26 DIAGNOSIS — M1712 Unilateral primary osteoarthritis, left knee: Secondary | ICD-10-CM | POA: Diagnosis not present

## 2015-11-26 DIAGNOSIS — Z471 Aftercare following joint replacement surgery: Secondary | ICD-10-CM | POA: Diagnosis not present

## 2015-11-26 DIAGNOSIS — Z96652 Presence of left artificial knee joint: Secondary | ICD-10-CM | POA: Diagnosis not present

## 2015-11-28 DIAGNOSIS — M1712 Unilateral primary osteoarthritis, left knee: Secondary | ICD-10-CM | POA: Diagnosis not present

## 2015-12-01 DIAGNOSIS — M1712 Unilateral primary osteoarthritis, left knee: Secondary | ICD-10-CM | POA: Diagnosis not present

## 2015-12-03 DIAGNOSIS — M1712 Unilateral primary osteoarthritis, left knee: Secondary | ICD-10-CM | POA: Diagnosis not present

## 2015-12-05 DIAGNOSIS — M1712 Unilateral primary osteoarthritis, left knee: Secondary | ICD-10-CM | POA: Diagnosis not present

## 2015-12-08 DIAGNOSIS — M1712 Unilateral primary osteoarthritis, left knee: Secondary | ICD-10-CM | POA: Diagnosis not present

## 2015-12-10 DIAGNOSIS — M1712 Unilateral primary osteoarthritis, left knee: Secondary | ICD-10-CM | POA: Diagnosis not present

## 2015-12-12 DIAGNOSIS — M1712 Unilateral primary osteoarthritis, left knee: Secondary | ICD-10-CM | POA: Diagnosis not present

## 2015-12-15 DIAGNOSIS — M1712 Unilateral primary osteoarthritis, left knee: Secondary | ICD-10-CM | POA: Diagnosis not present

## 2015-12-17 DIAGNOSIS — M1712 Unilateral primary osteoarthritis, left knee: Secondary | ICD-10-CM | POA: Diagnosis not present

## 2015-12-19 DIAGNOSIS — M1712 Unilateral primary osteoarthritis, left knee: Secondary | ICD-10-CM | POA: Diagnosis not present

## 2015-12-22 DIAGNOSIS — M1712 Unilateral primary osteoarthritis, left knee: Secondary | ICD-10-CM | POA: Diagnosis not present

## 2015-12-24 DIAGNOSIS — M1712 Unilateral primary osteoarthritis, left knee: Secondary | ICD-10-CM | POA: Diagnosis not present

## 2015-12-26 DIAGNOSIS — Z471 Aftercare following joint replacement surgery: Secondary | ICD-10-CM | POA: Diagnosis not present

## 2015-12-26 DIAGNOSIS — Z96652 Presence of left artificial knee joint: Secondary | ICD-10-CM | POA: Diagnosis not present

## 2015-12-29 DIAGNOSIS — M1712 Unilateral primary osteoarthritis, left knee: Secondary | ICD-10-CM | POA: Diagnosis not present

## 2015-12-31 DIAGNOSIS — M1712 Unilateral primary osteoarthritis, left knee: Secondary | ICD-10-CM | POA: Diagnosis not present

## 2016-01-02 DIAGNOSIS — M1712 Unilateral primary osteoarthritis, left knee: Secondary | ICD-10-CM | POA: Diagnosis not present

## 2016-01-05 DIAGNOSIS — M1712 Unilateral primary osteoarthritis, left knee: Secondary | ICD-10-CM | POA: Diagnosis not present

## 2016-01-06 ENCOUNTER — Encounter: Payer: Self-pay | Admitting: Nurse Practitioner

## 2016-01-06 ENCOUNTER — Ambulatory Visit (INDEPENDENT_AMBULATORY_CARE_PROVIDER_SITE_OTHER): Payer: Medicare Other | Admitting: Nurse Practitioner

## 2016-01-06 VITALS — BP 140/76 | HR 74 | Ht 67.0 in | Wt 173.0 lb

## 2016-01-06 DIAGNOSIS — I482 Chronic atrial fibrillation, unspecified: Secondary | ICD-10-CM

## 2016-01-06 DIAGNOSIS — I350 Nonrheumatic aortic (valve) stenosis: Secondary | ICD-10-CM

## 2016-01-06 DIAGNOSIS — I10 Essential (primary) hypertension: Secondary | ICD-10-CM | POA: Diagnosis not present

## 2016-01-06 NOTE — Progress Notes (Signed)
CARDIOLOGY OFFICE NOTE  Date:  01/06/2016    [name] Date of Birth: Mar 17, 1936 Medical Record [id number]  PCP:  Jonathon Bellows, MD  Cardiologist:  Radford Pax    Chief Complaint  Patient presents with  . Atrial Fibrillation  . Aortic Stenosis  . Hypertension    4 month check - seen for Dr. Radford Pax.     History of Present Illness: [name] is a 80 y.o. female who presents today for a follow up visit. Seen for Dr. Radford Pax.   She is an elderly [place] female with a hx of perm AFib, mild to mod AS, diastolic HF, HTN, OSA on CPAP, HL. She has seen Dr. Rayann Heman in the past and she is treated with a rate control strategy for her AFib. Last seen by Dr. Radford Pax 9/15.CHADS2-VASc=6 (age, gender, HTN, prior TIA).  Saw Richardson Dopp, [place] back in November for a pre op clearance. Needed knee surgery and proceeded on with Myoview and echo (these were done back in January).  Comes back today. Here with   Had her left TKR back just this past January. She is doing ok but will be having repeat surgery on 3/31 by Dr. Alvan Dame due to scar tissue which "will be broken up". No chest pain. Not short of breath. No syncope. She feels like she is doing well. Remains limited by her knee however. No problems with her medicines.   Past Medical History  Diagnosis Date  . Hypertension   . Arthritis   . GERD (gastroesophageal reflux disease)   . Aortic stenosis     a. Echo 02/25/14:  Mod LVH, EF 55-60%, no RWMA (mean 16 mmHg) // b. Echo 1/17: moderate LVH, EF 55-60%, normal wall motion, moderate aortic stenosis (mean 32 mmHg, peak 52 mmHg), moderate AI, mild MR, severe LAE, normal RVSF, RAE, mild TR, mild PI, PASP 53 mmHg  . Chronic atrial fibrillation (HCC) CARDIOLOGIST -- DR Tressia Miners TURNER    controlled  . Chronic diastolic CHF (congestive heart failure), NYHA class 1 ([place])   . Fibromyalgia   . History of TIA (transient ischemic attack)     per scan--  no residual  . Hyperlipidemia   . History  of breast cancer no recurrence    dx 2004   s/p  left breast mastectomy w/ node dissection/   no chemo or radiation  . Heart murmur     mild to mod AS, mild to mod. AR and MR, mild  TR per echo 02-25-2014  . OSA on CPAP      severe/  AHI 34/hr  . Chronic anticoagulation   . Urge urinary incontinence   . Wears glasses   . Short of breath on exertion     01-16-15 not a problem now.  . Cancer [facility])     left breast mastectomy(cancer)-surgery only  . History of nuclear stress test     Myoview 1/17: EF 54%, apical thinning, no ischemia, low risk  . Complication of anesthesia     extremely claustophobic(only occurs with narcotic medications as stated per pt)  . Anginal pain ([place])     pt had with A Fib but has not occurred since beginning Xarelto  . Dysrhythmia   . Diverticulosis   . Hard of hearing     wears hearing aides   . Nocturia     Past Surgical History  Procedure Laterality Date  . Carpal tunnel release Right 07-08-2004  . Appendectomy  1965  .  Tubal ligation  1973  . Mastectomy, modified radical w/reconstruction  09-16-2003    left breast W/ SLN DISSECTION (pt states had 13 operations for implant infection   . Total shoulder arthroplasty  08/11/2012    Procedure: TOTAL SHOULDER ARTHROPLASTY;  Surgeon: Augustin Schooling, MD;  Location: [facility];  Service: Orthopedics;  Laterality: Right;  RIGHT  TOTAL SHOULDER  ARTHROPLASTY   . Total hip arthroplasty Left 07/17/2013    Procedure: LEFT TOTAL HIP ARTHROPLASTY ANTERIOR APPROACH;  Surgeon: Mauri Pole, MD;  Location: [facility];  Service: Orthopedics;  Laterality: Left;  . Abdominoplasty  1987  . Revision breast reconstruction Left 11-11-2004    12-03-2004  DRAINAGE SEREMA LEFT CHEST  . Explantation breast implant and debridement  09-15-2005  . Replace tissue expander left breast and total capsulectomy  08-05-2006  . Colonoscopy with esophagogastroduodenoscopy (egd)  2006  . Transthoracic echocardiogram  02-25-2014    moderate LVH/   ef 55-60%/  mod. to sev. calcification AV with mild to moderate AV stenosis/  mild to mod. AR and MR/ mild PR/   moderate to severe LAE  . Interstim implant placement N/A 08/22/2014    Procedure: INTERSTIM IMPLANT STAGE ONE/TWO;  Surgeon: Reece Packer, MD;  Location: [facility];  Service: Urology;  Laterality: N/A;  . Total hip arthroplasty Right 01/28/2015    Procedure: RIGHT TOTAL HIP ARTHROPLASTY ANTERIOR APPROACH;  Surgeon: Paralee Cancel, MD;  Location: [facility];  Service: Orthopedics;  Laterality: Right;  . Mastectomy    . Breast surgery    . Total knee arthroplasty Left 11/11/2015    Procedure: LEFT TOTAL KNEE ARTHROPLASTY;  Surgeon: Paralee Cancel, MD;  Location: [facility];  Service: Orthopedics;  Laterality: Left;     Medications: Current Outpatient Prescriptions  Medication Sig Dispense Refill  . acetaminophen (TYLENOL) 325 MG tablet Take 1-2 tablets (325-650 mg total) by mouth every 4 (four) hours as needed for mild pain or moderate pain.    Marland Kitchen amoxicillin (AMOXIL) 500 MG capsule TAKE 4 CAPSULES BY MOUTH 1 HOUR BEFORE DENTAL APPT FOR PREMED  0  . atorvastatin (LIPITOR) 40 MG tablet Take 40 mg by mouth daily.    . Calcium Carbonate-Vitamin D (CALCIUM-VITAMIN D) 500-200 MG-UNIT tablet Take 1 tablet by mouth daily.    . clobetasol ointment (TEMOVATE) [id number] % Apply 1 application topically 2 (two) times daily as needed (for skin irritation).    Marland Kitchen diltiazem (TIAZAC) 360 MG 24 hr capsule Take 360 mg by mouth every evening.    . docusate sodium (COLACE) 100 MG capsule Take 1 capsule (100 mg total) by mouth 2 (two) times daily. 10 capsule 0  . ferrous sulfate 325 (65 FE) MG tablet Take 1 tablet (325 mg total) by mouth 3 (three) times daily after meals.  3  . furosemide (LASIX) 20 MG tablet TAKE 1 TABLET (20 MG TOTAL) BY MOUTH DAILY. 90 tablet 1  . glucosamine-chondroitin 500-400 MG tablet Take 1 tablet by mouth 2 (two) times daily.    Marland Kitchen losartan (COZAAR) 50 MG tablet Take 50 mg by  mouth daily.    . Melatonin 10 MG TABS Take 1 tablet by mouth at bedtime as needed (for sleep).     . Multiple Vitamins-Iron (MULTIVITAMINS WITH IRON) TABS tablet Take 1 tablet by mouth daily.    . nitroGLYCERIN (NITROSTAT) 0.4 MG SL tablet Place 0.4 mg under the tongue every 5 (five) minutes as needed. For chest pain    . omeprazole (PRILOSEC)  20 MG capsule Take 20 mg by mouth every morning.     . polyethylene glycol (MIRALAX / GLYCOLAX) packet Take 17 g by mouth 2 (two) times daily. 14 each 0  . rivaroxaban (XARELTO) 20 MG TABS tablet Take 20 mg by mouth daily with supper.    . traMADol (ULTRAM) 50 MG tablet Take 1-2 tablets (50-100 mg total) by mouth every 6 (six) hours as needed for moderate pain or severe pain. 50 tablet 0  . triamcinolone cream (KENALOG) 0.1 % Apply 1 application topically as needed (for skin irritation).     No current facility-administered medications for this visit.    Allergies: Allergies  Allergen Reactions  . Beta Adrenergic Blockers Shortness Of Breath  . Betapace [Sotalol Hcl] Shortness Of Breath  . Other     Narcotic pain medications - anxiety attacks  . Oxycodone Shortness Of Breath    Tolerates Dilaudid, Fentanyl  . Crestor [Rosuvastatin Calcium] Other (See Comments)    Cough, leg cramps  . Adhesive [Tape] Rash    Adhesive bandaids   . Clonidine Derivatives Palpitations    bradycardia  . Latex Rash    Per patient  . Tramadol Anxiety    Social History: The patient  reports that she quit smoking about 49 years ago. Her smoking use included Cigarettes. She has a 2 pack-year smoking history. She has never used smokeless tobacco. She reports that she does not drink alcohol or use illicit drugs.   Family History: The patient's family history includes Heart disease in her father and mother.   Review of Systems: Please see the history of present illness.   Otherwise, the review of systems is positive for none.   All other systems are reviewed and  negative.   Physical Exam: VS:  BP 140/76 mmHg  Pulse 74  Ht 5\' 7"  (1.702 m)  Wt 173 lb (78.472 kg)  BMI 27.09 kg/m2 .  BMI Body mass index is 27.09 kg/(m^2).  Wt Readings from Last 3 Encounters:  01/06/16 173 lb (78.472 kg)  11/11/15 177 lb (80.287 kg)  11/10/15 177 lb 6 oz (80.457 kg)    General: Pleasant. Well developed, well nourished and in no acute distress.  HEENT: Normal. Neck: Supple, no JVD, carotid bruits, or masses noted.  Cardiac: Irregular irregular rhythm with good rate control. Harsh outflow murmur. No edema.  Respiratory:  Lungs are clear to auscultation bilaterally with normal work of breathing.  GI: Soft and nontender.  MS: No deformity or atrophy. Gait and ROM intact. Skin: Warm and dry. Color is normal.  Neuro:  Strength and sensation are intact and no gross focal deficits noted.  Psych: Alert, appropriate and with normal affect.   LABORATORY DATA:  EKG:  EKG is not ordered today.  Lab Results  Component Value Date   WBC 11.0* 11/12/2015   HGB 12.4 11/12/2015   HCT 35.4* 11/12/2015   PLT 180 11/12/2015   GLUCOSE 133* 11/12/2015   ALT 24 01/24/2012   AST 25 01/24/2012   NA 137 11/12/2015   K 5.0 11/12/2015   CL 107 11/12/2015   CREATININE 0.68 11/12/2015   BUN 20 11/12/2015   CO2 22 11/12/2015   TSH 1.570 01/24/2012   INR 1.19 11/10/2015    BNP (last 3 results) No results for input(s): BNP in the last 8760 hours.  ProBNP (last 3 results) No results for input(s): PROBNP in the last 8760 hours.   Other Studies Reviewed Today: Studies/Reports Reviewed Today:  Myoview Study  Highlights from 10/2015     Nuclear stress EF: 54%.  There was no ST segment deviation noted during stress.  The study is normal.  The left ventricular ejection fraction is mildly decreased (45-54%).  Normal stress nuclear study with a small, mild, fixed apical defect consistent with thinning; no ischemia; EF 54 with normal wall motion.   Echo Study  Conclusions from 10/2015  - Left ventricle: The cavity size was normal. There was moderate  concentric hypertrophy. Systolic function was normal. The  estimated ejection fraction was in the range of 55% to 60%. Wall  motion was normal; there were no regional wall motion  abnormalities. - Aortic valve: Transvalvular velocity was increased. There was  moderate stenosis. There was moderate regurgitation. Valve area  (VTI): 0.83 cm^2. Valve area (Vmax): 0.79 cm^2. Valve area  (Vmean): 0.69 cm^2. - Mitral valve: There was mild regurgitation. - Left atrium: The atrium was severely dilated. - Right ventricle: The cavity size was moderately dilated. Wall  thickness was normal. Systolic function was normal. - Right atrium: The atrium was dilated. - Tricuspid valve: There was mild regurgitation. - Pulmonic valve: There was mild regurgitation. - Pulmonary arteries: Systolic pressure was severely increased. PA  peak pressure: 53 mm Hg (S). - Inferior vena cava: The vessel was normal in size. The  respirophasic diameter changes were in the normal range (= 50%),  consistent with normal central venous pressure.   Assessment/Plan: 1. Chronic AF - rate controlled and on anticoagulation  2. Aortic Stenosis: No symptoms to suggest worsening.Recent echo noted - will need following - no cardinal symptoms reported.  3. Chronic anticoagulation with Xarelto - no problems noted.   4. HTN: BP fair on current regimen.   5. Hyperlipidemia: On statin therapy. Labs checked by PCP.  6. Chronic Diastolic CHF: Volume stable.   7. OSA: Continue CPAP.    Current medicines are reviewed with the patient today.  The patient does not have concerns regarding medicines other than what has been noted above.  The following changes have been made:  See above.  Labs/ tests ordered today include:   No orders of the defined types were placed in this encounter.     Disposition:   FU with Dr.  Radford Pax in 6 months.   Patient is agreeable to this plan and will call if any problems develop in the interim.   Signed: Burtis Junes, RN, ANP-C 01/06/2016 11:39 AM  [facility] [address] [place], [place]  [address] Phone: [phone] Fax: [phone]

## 2016-01-06 NOTE — Patient Instructions (Addendum)
We will be checking the following labs today - NONE   Medication Instructions:    Continue with your current medicines.     Testing/Procedures To Be Arranged:  N/A  Follow-Up:   See Dr. Radford Pax in 6 months.    Other Special Instructions:   N/A    If you need a refill on your cardiac medications before your next appointment, please call your pharmacy.   Call the [place] office at [phone] if you have any questions, problems or concerns.

## 2016-01-07 DIAGNOSIS — M1712 Unilateral primary osteoarthritis, left knee: Secondary | ICD-10-CM | POA: Diagnosis not present

## 2016-01-09 DIAGNOSIS — M1712 Unilateral primary osteoarthritis, left knee: Secondary | ICD-10-CM | POA: Diagnosis not present

## 2016-01-12 DIAGNOSIS — M1712 Unilateral primary osteoarthritis, left knee: Secondary | ICD-10-CM | POA: Diagnosis not present

## 2016-01-14 DIAGNOSIS — M1712 Unilateral primary osteoarthritis, left knee: Secondary | ICD-10-CM | POA: Diagnosis not present

## 2016-01-16 DIAGNOSIS — M24662 Ankylosis, left knee: Secondary | ICD-10-CM | POA: Diagnosis not present

## 2016-01-16 DIAGNOSIS — G8918 Other acute postprocedural pain: Secondary | ICD-10-CM | POA: Diagnosis not present

## 2016-01-16 DIAGNOSIS — Z96652 Presence of left artificial knee joint: Secondary | ICD-10-CM | POA: Diagnosis not present

## 2016-01-19 DIAGNOSIS — M1712 Unilateral primary osteoarthritis, left knee: Secondary | ICD-10-CM | POA: Diagnosis not present

## 2016-01-21 DIAGNOSIS — M1712 Unilateral primary osteoarthritis, left knee: Secondary | ICD-10-CM | POA: Diagnosis not present

## 2016-01-23 DIAGNOSIS — M1712 Unilateral primary osteoarthritis, left knee: Secondary | ICD-10-CM | POA: Diagnosis not present

## 2016-01-26 DIAGNOSIS — M1712 Unilateral primary osteoarthritis, left knee: Secondary | ICD-10-CM | POA: Diagnosis not present

## 2016-01-28 DIAGNOSIS — M1712 Unilateral primary osteoarthritis, left knee: Secondary | ICD-10-CM | POA: Diagnosis not present

## 2016-02-02 DIAGNOSIS — M1712 Unilateral primary osteoarthritis, left knee: Secondary | ICD-10-CM | POA: Diagnosis not present

## 2016-02-04 DIAGNOSIS — M1712 Unilateral primary osteoarthritis, left knee: Secondary | ICD-10-CM | POA: Diagnosis not present

## 2016-02-06 DIAGNOSIS — M25662 Stiffness of left knee, not elsewhere classified: Secondary | ICD-10-CM | POA: Diagnosis not present

## 2016-03-09 DIAGNOSIS — M064 Inflammatory polyarthropathy: Secondary | ICD-10-CM | POA: Diagnosis not present

## 2016-03-09 DIAGNOSIS — R5383 Other fatigue: Secondary | ICD-10-CM | POA: Diagnosis not present

## 2016-03-09 DIAGNOSIS — M154 Erosive (osteo)arthritis: Secondary | ICD-10-CM | POA: Diagnosis not present

## 2016-03-09 DIAGNOSIS — M79641 Pain in right hand: Secondary | ICD-10-CM | POA: Diagnosis not present

## 2016-03-09 DIAGNOSIS — M2550 Pain in unspecified joint: Secondary | ICD-10-CM | POA: Diagnosis not present

## 2016-03-09 DIAGNOSIS — M79642 Pain in left hand: Secondary | ICD-10-CM | POA: Diagnosis not present

## 2016-03-09 DIAGNOSIS — M150 Primary generalized (osteo)arthritis: Secondary | ICD-10-CM | POA: Diagnosis not present

## 2016-03-18 DIAGNOSIS — M154 Erosive (osteo)arthritis: Secondary | ICD-10-CM | POA: Diagnosis not present

## 2016-03-18 DIAGNOSIS — M2550 Pain in unspecified joint: Secondary | ICD-10-CM | POA: Diagnosis not present

## 2016-03-18 DIAGNOSIS — M150 Primary generalized (osteo)arthritis: Secondary | ICD-10-CM | POA: Diagnosis not present

## 2016-03-18 DIAGNOSIS — M064 Inflammatory polyarthropathy: Secondary | ICD-10-CM | POA: Diagnosis not present

## 2016-06-10 ENCOUNTER — Other Ambulatory Visit: Payer: Self-pay | Admitting: Family Medicine

## 2016-06-10 DIAGNOSIS — Z9012 Acquired absence of left breast and nipple: Secondary | ICD-10-CM

## 2016-06-10 DIAGNOSIS — Z1231 Encounter for screening mammogram for malignant neoplasm of breast: Secondary | ICD-10-CM

## 2016-06-18 DIAGNOSIS — M2550 Pain in unspecified joint: Secondary | ICD-10-CM | POA: Diagnosis not present

## 2016-06-18 DIAGNOSIS — M150 Primary generalized (osteo)arthritis: Secondary | ICD-10-CM | POA: Diagnosis not present

## 2016-06-18 DIAGNOSIS — M154 Erosive (osteo)arthritis: Secondary | ICD-10-CM | POA: Diagnosis not present

## 2016-06-18 DIAGNOSIS — Z23 Encounter for immunization: Secondary | ICD-10-CM | POA: Diagnosis not present

## 2016-06-18 DIAGNOSIS — M064 Inflammatory polyarthropathy: Secondary | ICD-10-CM | POA: Diagnosis not present

## 2016-06-24 ENCOUNTER — Ambulatory Visit
Admission: RE | Admit: 2016-06-24 | Discharge: 2016-06-24 | Disposition: A | Discharge status: Home / Self Care | Payer: Medicare Other | Source: Ambulatory Visit | Attending: Family Medicine | Admitting: Family Medicine

## 2016-06-24 DIAGNOSIS — Z1231 Encounter for screening mammogram for malignant neoplasm of breast: Secondary | ICD-10-CM | POA: Diagnosis not present

## 2016-06-24 DIAGNOSIS — Z9012 Acquired absence of left breast and nipple: Secondary | ICD-10-CM

## 2016-07-16 DIAGNOSIS — Z96652 Presence of left artificial knee joint: Secondary | ICD-10-CM | POA: Diagnosis not present

## 2016-07-16 DIAGNOSIS — Z471 Aftercare following joint replacement surgery: Secondary | ICD-10-CM | POA: Diagnosis not present

## 2016-08-11 DIAGNOSIS — L570 Actinic keratosis: Secondary | ICD-10-CM | POA: Diagnosis not present

## 2016-11-15 DIAGNOSIS — M064 Inflammatory polyarthropathy: Secondary | ICD-10-CM | POA: Diagnosis not present

## 2016-11-15 DIAGNOSIS — M150 Primary generalized (osteo)arthritis: Secondary | ICD-10-CM | POA: Diagnosis not present

## 2016-11-15 DIAGNOSIS — M154 Erosive (osteo)arthritis: Secondary | ICD-10-CM | POA: Diagnosis not present

## 2016-11-15 DIAGNOSIS — Z6829 Body mass index (BMI) 29.0-29.9, adult: Secondary | ICD-10-CM | POA: Diagnosis not present

## 2016-11-15 DIAGNOSIS — M2550 Pain in unspecified joint: Secondary | ICD-10-CM | POA: Diagnosis not present

## 2016-11-18 ENCOUNTER — Observation Stay (HOSPITAL_BASED_OUTPATIENT_CLINIC_OR_DEPARTMENT_OTHER): Payer: Medicare Other

## 2016-11-18 ENCOUNTER — Inpatient Hospital Stay (HOSPITAL_COMMUNITY)
Admission: EM | Admit: 2016-11-18 | Discharge: 2016-11-20 | DRG: 287 | Disposition: A | Discharge status: Home / Self Care | Payer: Medicare Other | Attending: Internal Medicine | Admitting: Internal Medicine

## 2016-11-18 ENCOUNTER — Ambulatory Visit (HOSPITAL_COMMUNITY): Admit: 2016-11-18 | Payer: Medicare Other | Admitting: Interventional Cardiology

## 2016-11-18 ENCOUNTER — Emergency Department (HOSPITAL_COMMUNITY): Payer: Medicare Other

## 2016-11-18 ENCOUNTER — Encounter (HOSPITAL_COMMUNITY): Payer: Self-pay | Admitting: *Deleted

## 2016-11-18 DIAGNOSIS — I10 Essential (primary) hypertension: Secondary | ICD-10-CM | POA: Diagnosis not present

## 2016-11-18 DIAGNOSIS — Z6827 Body mass index (BMI) 27.0-27.9, adult: Secondary | ICD-10-CM | POA: Diagnosis not present

## 2016-11-18 DIAGNOSIS — Z79899 Other long term (current) drug therapy: Secondary | ICD-10-CM

## 2016-11-18 DIAGNOSIS — I481 Persistent atrial fibrillation: Secondary | ICD-10-CM | POA: Diagnosis not present

## 2016-11-18 DIAGNOSIS — I272 Pulmonary hypertension, unspecified: Secondary | ICD-10-CM | POA: Diagnosis present

## 2016-11-18 DIAGNOSIS — Z7952 Long term (current) use of systemic steroids: Secondary | ICD-10-CM

## 2016-11-18 DIAGNOSIS — I4821 Permanent atrial fibrillation: Secondary | ICD-10-CM | POA: Diagnosis present

## 2016-11-18 DIAGNOSIS — I350 Nonrheumatic aortic (valve) stenosis: Secondary | ICD-10-CM

## 2016-11-18 DIAGNOSIS — Z96652 Presence of left artificial knee joint: Secondary | ICD-10-CM | POA: Diagnosis present

## 2016-11-18 DIAGNOSIS — Z96611 Presence of right artificial shoulder joint: Secondary | ICD-10-CM | POA: Diagnosis present

## 2016-11-18 DIAGNOSIS — I482 Chronic atrial fibrillation: Secondary | ICD-10-CM

## 2016-11-18 DIAGNOSIS — Z7901 Long term (current) use of anticoagulants: Secondary | ICD-10-CM

## 2016-11-18 DIAGNOSIS — G4733 Obstructive sleep apnea (adult) (pediatric): Secondary | ICD-10-CM | POA: Diagnosis not present

## 2016-11-18 DIAGNOSIS — I5032 Chronic diastolic (congestive) heart failure: Secondary | ICD-10-CM | POA: Diagnosis not present

## 2016-11-18 DIAGNOSIS — E785 Hyperlipidemia, unspecified: Secondary | ICD-10-CM | POA: Diagnosis present

## 2016-11-18 DIAGNOSIS — Z87891 Personal history of nicotine dependence: Secondary | ICD-10-CM | POA: Diagnosis not present

## 2016-11-18 DIAGNOSIS — I110 Hypertensive heart disease with heart failure: Secondary | ICD-10-CM | POA: Diagnosis present

## 2016-11-18 DIAGNOSIS — Z853 Personal history of malignant neoplasm of breast: Secondary | ICD-10-CM

## 2016-11-18 DIAGNOSIS — R079 Chest pain, unspecified: Secondary | ICD-10-CM

## 2016-11-18 DIAGNOSIS — I080 Rheumatic disorders of both mitral and aortic valves: Secondary | ICD-10-CM | POA: Diagnosis not present

## 2016-11-18 DIAGNOSIS — I2510 Atherosclerotic heart disease of native coronary artery without angina pectoris: Secondary | ICD-10-CM | POA: Diagnosis not present

## 2016-11-18 DIAGNOSIS — K219 Gastro-esophageal reflux disease without esophagitis: Secondary | ICD-10-CM | POA: Diagnosis present

## 2016-11-18 DIAGNOSIS — Z8673 Personal history of transient ischemic attack (TIA), and cerebral infarction without residual deficits: Secondary | ICD-10-CM | POA: Diagnosis not present

## 2016-11-18 DIAGNOSIS — Z96643 Presence of artificial hip joint, bilateral: Secondary | ICD-10-CM | POA: Diagnosis present

## 2016-11-18 DIAGNOSIS — H9190 Unspecified hearing loss, unspecified ear: Secondary | ICD-10-CM | POA: Diagnosis present

## 2016-11-18 DIAGNOSIS — I2584 Coronary atherosclerosis due to calcified coronary lesion: Secondary | ICD-10-CM | POA: Diagnosis present

## 2016-11-18 DIAGNOSIS — E669 Obesity, unspecified: Secondary | ICD-10-CM | POA: Diagnosis present

## 2016-11-18 HISTORY — DX: Chronic diastolic (congestive) heart failure: I50.32

## 2016-11-18 LAB — CBC
HCT: 40.9 % (ref 36.0–46.0)
Hemoglobin: 13.4 g/dL (ref 12.0–15.0)
MCH: 31.2 pg (ref 26.0–34.0)
MCHC: 32.8 g/dL (ref 30.0–36.0)
MCV: 95.1 fL (ref 78.0–100.0)
PLATELETS: 214 10*3/uL (ref 150–400)
RBC: 4.3 MIL/uL (ref 3.87–5.11)
RDW: 14.2 % (ref 11.5–15.5)
WBC: 10.3 10*3/uL (ref 4.0–10.5)

## 2016-11-18 LAB — BASIC METABOLIC PANEL
Anion gap: 7 (ref 5–15)
BUN: 17 mg/dL (ref 6–20)
CALCIUM: 9.5 mg/dL (ref 8.9–10.3)
CO2: 26 mmol/L (ref 22–32)
Chloride: 107 mmol/L (ref 101–111)
Creatinine, Ser: 0.87 mg/dL (ref 0.44–1.00)
GFR calc non Af Amer: >60 mL/min (ref >60)
Glucose, Bld: 100 mg/dL — ABNORMAL HIGH (ref 65–99)
Potassium: 4.2 mmol/L (ref 3.5–5.1)
SODIUM: 140 mmol/L (ref 135–145)

## 2016-11-18 LAB — ECHOCARDIOGRAM COMPLETE: Weight: 2768.98 oz

## 2016-11-18 LAB — I-STAT TROPONIN, ED: TROPONIN I, POC: 0.03 ng/mL (ref 0.00–0.08)

## 2016-11-18 LAB — TROPONIN I: Troponin I: 0.06 ng/mL (ref <0.03)

## 2016-11-18 LAB — PROTIME-INR
INR: 1.83
PROTHROMBIN TIME: 21.4 s — AB (ref 11.4–15.2)

## 2016-11-18 LAB — BRAIN NATRIURETIC PEPTIDE: B Natriuretic Peptide: 352.5 pg/mL — ABNORMAL HIGH (ref 0.0–100.0)

## 2016-11-18 MED ORDER — PANTOPRAZOLE SODIUM 40 MG PO TBEC
40.0000 mg | DELAYED_RELEASE_TABLET | Freq: Every day | ORAL | Status: DC
  Administered 2016-11-18 – 2016-11-20 (×3): 40 mg via ORAL
  Filled 2016-11-18 (×3): qty 1

## 2016-11-18 MED ORDER — ACETAMINOPHEN 325 MG PO TABS: 650.0000 mg | ORAL_TABLET | ORAL | Status: DC | PRN

## 2016-11-18 MED ORDER — ONDANSETRON HCL 4 MG/2ML IJ SOLN
4.0000 mg | Freq: Once | INTRAMUSCULAR | Status: AC
  Administered 2016-11-18: 4 mg via INTRAVENOUS
  Filled 2016-11-18: qty 2

## 2016-11-18 MED ORDER — ATORVASTATIN CALCIUM 20 MG PO TABS
20.0000 mg | ORAL_TABLET | Freq: Every day | ORAL | Status: DC
  Administered 2016-11-18 – 2016-11-20 (×3): 20 mg via ORAL
  Filled 2016-11-18 (×3): qty 1

## 2016-11-18 MED ORDER — GI COCKTAIL ~~LOC~~
30.0000 mL | Freq: Four times a day (QID) | ORAL | Status: DC | PRN
  Administered 2016-11-18: 30 mL via ORAL
  Filled 2016-11-18: qty 30

## 2016-11-18 MED ORDER — SODIUM CHLORIDE 0.9 % WEIGHT BASED INFUSION
1.0000 mL/kg/h | INTRAVENOUS | Status: DC
  Administered 2016-11-19: 1 mL/kg/h via INTRAVENOUS

## 2016-11-18 MED ORDER — NITROGLYCERIN 0.4 MG SL SUBL
0.4000 mg | SUBLINGUAL_TABLET | SUBLINGUAL | Status: AC | PRN
  Administered 2016-11-18 (×3): 0.4 mg via SUBLINGUAL
  Filled 2016-11-18: qty 1

## 2016-11-18 MED ORDER — RIVAROXABAN 20 MG PO TABS: 20.0000 mg | ORAL_TABLET | Freq: Every day | ORAL | Status: DC

## 2016-11-18 MED ORDER — MORPHINE SULFATE (PF) 4 MG/ML IV SOLN
2.0000 mg | Freq: Once | INTRAVENOUS | Status: AC
  Administered 2016-11-18: 2 mg via INTRAVENOUS
  Filled 2016-11-18: qty 1

## 2016-11-18 MED ORDER — SODIUM CHLORIDE 0.9 % WEIGHT BASED INFUSION
3.0000 mL/kg/h | INTRAVENOUS | Status: DC
  Administered 2016-11-19: 3 mL/kg/h via INTRAVENOUS

## 2016-11-18 MED ORDER — NITROGLYCERIN 0.4 MG SL SUBL: 0.4000 mg | SUBLINGUAL_TABLET | SUBLINGUAL | Status: DC | PRN

## 2016-11-18 MED ORDER — MELATONIN 3 MG PO TABS
9.0000 mg | ORAL_TABLET | Freq: Every day | ORAL | Status: DC
  Administered 2016-11-18 – 2016-11-19 (×2): 9 mg via ORAL
  Filled 2016-11-18 (×2): qty 3

## 2016-11-18 MED ORDER — ONDANSETRON HCL 4 MG/2ML IJ SOLN: 4.0000 mg | Freq: Four times a day (QID) | INTRAMUSCULAR | Status: DC | PRN

## 2016-11-18 MED ORDER — PREDNISONE 5 MG PO TABS
5.0000 mg | ORAL_TABLET | Freq: Every day | ORAL | Status: DC
  Administered 2016-11-19 – 2016-11-20 (×2): 5 mg via ORAL
  Filled 2016-11-18 (×2): qty 1

## 2016-11-18 MED ORDER — MELATONIN 10 MG PO TABS: 1.0000 | ORAL_TABLET | Freq: Every day | ORAL | Status: DC

## 2016-11-18 MED ORDER — HEPARIN (PORCINE) IN NACL 100-0.45 UNIT/ML-% IJ SOLN
900.0000 [IU]/h | INTRAMUSCULAR | Status: DC
  Administered 2016-11-18: 900 [IU]/h via INTRAVENOUS
  Filled 2016-11-18: qty 250

## 2016-11-18 MED ORDER — SODIUM CHLORIDE 0.9% FLUSH: 3.0000 mL | INTRAVENOUS | Status: DC | PRN

## 2016-11-18 MED ORDER — ASPIRIN 81 MG PO CHEW
324.0000 mg | CHEWABLE_TABLET | ORAL | Status: AC
  Administered 2016-11-19: 324 mg via ORAL
  Filled 2016-11-18: qty 4

## 2016-11-18 MED ORDER — LOSARTAN POTASSIUM 50 MG PO TABS
50.0000 mg | ORAL_TABLET | Freq: Every day | ORAL | Status: DC
  Administered 2016-11-18 – 2016-11-20 (×3): 50 mg via ORAL
  Filled 2016-11-18 (×3): qty 1

## 2016-11-18 MED ORDER — ASPIRIN 81 MG PO CHEW
324.0000 mg | CHEWABLE_TABLET | Freq: Once | ORAL | Status: AC
  Administered 2016-11-18: 324 mg via ORAL
  Filled 2016-11-18: qty 4

## 2016-11-18 MED ORDER — SODIUM CHLORIDE 0.9 % IV SOLN: 250.0000 mL | INTRAVENOUS | Status: DC | PRN

## 2016-11-18 MED ORDER — SODIUM CHLORIDE 0.9% FLUSH: 3.0000 mL | Freq: Two times a day (BID) | INTRAVENOUS | Status: DC

## 2016-11-18 MED ORDER — SODIUM CHLORIDE 0.9 % WEIGHT BASED INFUSION: 3.0000 mL/kg/h | INTRAVENOUS | Status: AC

## 2016-11-18 MED ORDER — SODIUM CHLORIDE 0.9 % WEIGHT BASED INFUSION: 1.0000 mL/kg/h | INTRAVENOUS | Status: DC

## 2016-11-18 MED ORDER — FUROSEMIDE 20 MG PO TABS
20.0000 mg | ORAL_TABLET | Freq: Every day | ORAL | Status: DC
  Administered 2016-11-18 – 2016-11-20 (×3): 20 mg via ORAL
  Filled 2016-11-18 (×3): qty 1

## 2016-11-18 NOTE — ED Triage Notes (Signed)
Pt has hx of afib. Reports having onset of afib at 3pm yesterday, took her medications, symptoms seemed to resolve. C/o chest pains, described as sharp and pressure with dizziness, denies sob

## 2016-11-18 NOTE — ED Provider Notes (Signed)
TIME SEEN: 4:45 AM  CHIEF COMPLAINT: Chest pain  HPI: Pt is a 81 y.o. female with history of aortic stenosis, previous breast cancer status post mastectomy on the left side, atrial fibrillation on Xarelto, diastolic heart failure, hypertension, previous tobacco use who quit in 1968 who presents emergency department with complaints of chest pain. Described as central without radiation and a "pushing" pain. Has had associated lightheadedness but no shortness of breath, nausea, vomiting or diaphoresis. Pain improves with nitroglycerin. No aggravating factors. Last stress test was 11/06/2015 and was low risk. No history of cardiac catheterization. Denies history of PE or DVT. Pain has been intermittent.  PCP is Dr. Justin Mend Cardiologist is Dr. Radford Pax  ROS: See HPI Constitutional: no fever  Eyes: no drainage  ENT: no runny nose   Cardiovascular:   chest pain  Resp: no SOB  GI: no vomiting GU: no dysuria Integumentary: no rash  Allergy: no hives  Musculoskeletal: no leg swelling  Neurological: no slurred speech ROS otherwise negative  PAST MEDICAL HISTORY/PAST SURGICAL HISTORY:  Past Medical History:  Diagnosis Date  . Anginal pain ([place])    pt had with A Fib but has not occurred since beginning Xarelto  . Aortic stenosis    a. Echo 02/25/14:  Mod LVH, EF 55-60%, no RWMA (mean 16 mmHg) // b. Echo 1/17: moderate LVH, EF 55-60%, normal wall motion, moderate aortic stenosis (mean 32 mmHg, peak 52 mmHg), moderate AI, mild MR, severe LAE, normal RVSF, RAE, mild TR, mild PI, PASP 53 mmHg  . Arthritis   . Cancer [facility])    left breast mastectomy(cancer)-surgery only  . Chronic anticoagulation   . Chronic atrial fibrillation (HCC) CARDIOLOGIST -- DR Tressia Miners TURNER   controlled  . Chronic diastolic CHF (congestive heart failure), NYHA class 1 ([place])   . Complication of anesthesia    extremely claustophobic(only occurs with narcotic medications as stated per pt)  . Diverticulosis   . Dysrhythmia   .  Fibromyalgia   . GERD (gastroesophageal reflux disease)   . Hard of hearing    wears hearing aides   . Heart murmur    mild to mod AS, mild to mod. AR and MR, mild  TR per echo 02-25-2014  . History of breast cancer no recurrence   dx 2004   s/p  left breast mastectomy w/ node dissection/   no chemo or radiation  . History of nuclear stress test    Myoview 1/17: EF 54%, apical thinning, no ischemia, low risk  . History of TIA (transient ischemic attack)    per scan--  no residual  . Hyperlipidemia   . Hypertension   . Nocturia   . OSA on CPAP     severe/  AHI 34/hr  . Short of breath on exertion    01-16-15 not a problem now.  . Urge urinary incontinence   . Wears glasses     MEDICATIONS:  Prior to Admission medications   Medication Sig Start Date End Date Taking? Authorizing Provider  acetaminophen (TYLENOL) 325 MG tablet Take 1-2 tablets (325-650 mg total) by mouth every 4 (four) hours as needed for mild pain or moderate pain. 01/29/15   Danae Orleans, PA-C  amoxicillin (AMOXIL) 500 MG capsule TAKE 4 CAPSULES BY MOUTH 1 HOUR BEFORE DENTAL APPT FOR PREMED 09/03/15   Historical Provider, MD  atorvastatin (LIPITOR) 40 MG tablet Take 40 mg by mouth daily.    Historical Provider, MD  Calcium Carbonate-Vitamin D (CALCIUM-VITAMIN D) 500-200 MG-UNIT tablet Take  1 tablet by mouth daily.    Historical Provider, MD  clobetasol ointment (TEMOVATE) [id number] % Apply 1 application topically 2 (two) times daily as needed (for skin irritation).    Historical Provider, MD  diltiazem (TIAZAC) 360 MG 24 hr capsule Take 360 mg by mouth every evening.    Historical Provider, MD  docusate sodium (COLACE) 100 MG capsule Take 1 capsule (100 mg total) by mouth 2 (two) times daily. 11/12/15   Danae Orleans, PA-C  ferrous sulfate 325 (65 FE) MG tablet Take 1 tablet (325 mg total) by mouth 3 (three) times daily after meals. 11/12/15   Danae Orleans, PA-C  furosemide (LASIX) 20 MG tablet TAKE 1 TABLET (20 MG TOTAL)  BY MOUTH DAILY. 08/28/14   Sueanne Margarita, MD  glucosamine-chondroitin 500-400 MG tablet Take 1 tablet by mouth 2 (two) times daily.    Historical Provider, MD  losartan (COZAAR) 50 MG tablet Take 50 mg by mouth daily.    Historical Provider, MD  Melatonin 10 MG TABS Take 1 tablet by mouth at bedtime as needed (for sleep).     Historical Provider, MD  Multiple Vitamins-Iron (MULTIVITAMINS WITH IRON) TABS tablet Take 1 tablet by mouth daily.    Historical Provider, MD  nitroGLYCERIN (NITROSTAT) 0.4 MG SL tablet Place 0.4 mg under the tongue every 5 (five) minutes as needed. For chest pain    Historical Provider, MD  omeprazole (PRILOSEC) 20 MG capsule Take 20 mg by mouth every morning.     Historical Provider, MD  polyethylene glycol (MIRALAX / GLYCOLAX) packet Take 17 g by mouth 2 (two) times daily. 11/12/15   Danae Orleans, PA-C  rivaroxaban (XARELTO) 20 MG TABS tablet Take 20 mg by mouth daily with supper.    Historical Provider, MD  traMADol (ULTRAM) 50 MG tablet Take 1-2 tablets (50-100 mg total) by mouth every 6 (six) hours as needed for moderate pain or severe pain. 11/12/15   Danae Orleans, PA-C  triamcinolone cream (KENALOG) 0.1 % Apply 1 application topically as needed (for skin irritation).    Historical Provider, MD    ALLERGIES:  Allergies  Allergen Reactions  . Beta Adrenergic Blockers Shortness Of Breath  . Betapace [Sotalol Hcl] Shortness Of Breath  . Other     Narcotic pain medications - anxiety attacks  . Oxycodone Shortness Of Breath    Tolerates Dilaudid, Fentanyl  . Crestor [Rosuvastatin Calcium] Other (See Comments)    Cough, leg cramps  . Adhesive [Tape] Rash    Adhesive bandaids   . Clonidine Derivatives Palpitations    bradycardia  . Latex Rash    Per patient  . Tramadol Anxiety    SOCIAL HISTORY:  Social History  Substance Use Topics  . Smoking status: Former Smoker    Packs/day: 0.25    Years: 8.00    Types: Cigarettes    Quit date: 10/18/1966  .  Smokeless tobacco: Never Used  . Alcohol use No    FAMILY HISTORY: Family History  Problem Relation Age of Onset  . Heart disease Mother   . Heart disease Father     EXAM: BP 152/88   Pulse 73   Temp 97.3 F (36.3 C) (Oral)   Resp 16   SpO2 99%  CONSTITUTIONAL: Alert and oriented and responds appropriately to questions. Well-appearing; well-nourished, Elderly, in no distress HEAD: Normocephalic EYES: Conjunctivae clear, PERRL, EOMI ENT: normal nose; no rhinorrhea; moist mucous membranes NECK: Supple, no meningismus, no nuchal rigidity, no LAD  CARD: Irregularly  irregular and mildly bradycardic; S1 and S2 appreciated; no murmurs, no clicks, no rubs, no gallops RESP: Normal chest excursion without splinting or tachypnea; breath sounds clear and equal bilaterally; no wheezes, no rhonchi, no rales, no hypoxia or respiratory distress, speaking full sentences ABD/GI: Normal bowel sounds; non-distended; soft, non-tender, no rebound, no guarding, no peritoneal signs, no hepatosplenomegaly BACK:  The back appears normal and is non-tender to palpation, there is no CVA tenderness EXT: Normal ROM in all joints; non-tender to palpation; no edema; normal capillary refill; no cyanosis, no calf tenderness or swelling    SKIN: Normal color for age and race; warm; no rash NEURO: Moves all extremities equally, sensation to light touch intact diffusely, cranial nerves II through XII intact, normal speech PSYCH: The patient's mood and manner are appropriate. Grooming and personal hygiene are appropriate.  MEDICAL DECISION MAKING: Patient here with chest pain has been intermittent. Will obtain cardiac labs, chest x-ray. EKG shows atrial fibrillation without ischemic abnormality. Patient on Xarelto. Has chronic history of atrial fibrillation. Last stress test was 1 year ago and was unremarkable. Heart score is 4. Anticipate admission for chest pain rule out.  ED PROGRESS: Troponin negative. Labs  unremarkable. Chest x-ray clear. Patient's pain improving with nitroglycerin. Will discuss with medicine.   5:35 AM Pain is now a 3/10 after 3 nitroglycerin. Will give morphine. Discussed with Dr. Loleta Books who agrees with admission to telemetry, observation. I will place will orders per his request. Patient updated with this plan.   Patient pain-free after 2 mg of IV morphine. Awaiting telemetry bed.   I reviewed all nursing notes, vitals, pertinent old records, EKGs, labs, imaging (as available).    EKG Interpretation  Date/Time:  Thursday November 18 2016 03:05:23 EST Ventricular Rate:  63 PR Interval:    QRS Duration: 90 QT Interval:  440 QTC Calculation: 450 R Axis:   29 Text Interpretation:  Atrial fibrillation Minimal voltage criteria for LVH, may be normal variant Cannot rule out Anterior infarct , age undetermined Abnormal ECG Confirmed by Youssouf Shipley,  DO, Ashni Lonzo [phone]) on 11/18/2016 4:44:06 AM         [place], DO 11/18/16 ED:[id number]

## 2016-11-18 NOTE — Consult Note (Addendum)
Cardiology Consult    Patient ID: [name] MRN: [id number], DOB/AGE: 79-Apr-1937   Admit date: 11/18/2016 Date of Consult: 11/18/2016  Primary Physician: Jacqueline Bellows, MD Primary Cardiologist: Dr. Radford Wise Requesting Provider: Dr. Posey Wise  Patient Profile    [name] is a 81 yo female well-known to our service with a PMH significant for chronic Afib (on xarelto), HTN, chronic diastolic heart failure, aortic stenosis, HLD, obesity, GERD, and breast cancer. She presented to the ED 11/17/16 for sudden onset chest pain. Cardiology was consulted for ACS workup.  Past Medical History   Past Medical History:  Diagnosis Date  . Anginal pain ([name])    pt had with A Fib but has not occurred since beginning Xarelto  . Aortic stenosis    a. Echo 02/25/14:  Mod LVH, EF 55-60%, no RWMA (mean 16 mmHg) // b. Echo 1/17: moderate LVH, EF 55-60%, normal wall motion, moderate aortic stenosis (mean 32 mmHg, peak 52 mmHg), moderate AI, mild MR, severe LAE, normal RVSF, RAE, mild TR, mild PI, PASP 53 mmHg  . Arthritis   . Cancer [facility])    left breast mastectomy(cancer)-surgery only  . Chronic anticoagulation   . Chronic atrial fibrillation (HCC) CARDIOLOGIST -- DR [name]   controlled  . Chronic diastolic CHF (congestive heart failure), NYHA class 1 ([place])   . Complication of anesthesia    extremely claustophobic(only occurs with narcotic medications as stated per pt)  . Diverticulosis   . Dysrhythmia   . Fibromyalgia   . GERD (gastroesophageal reflux disease)   . Hard of hearing    wears hearing aides   . Heart murmur    mild to mod AS, mild to mod. AR and MR, mild  TR per echo 02-25-2014  . History of breast cancer no recurrence   dx 2004   s/p  left breast mastectomy w/ node dissection/   no chemo or radiation  . History of nuclear stress test    Myoview 1/17: EF 54%, apical thinning, no ischemia, low risk  . History of TIA (transient ischemic attack)    per scan--  no residual  .  Hyperlipidemia   . Hypertension   . Nocturia   . OSA on CPAP     severe/  AHI 34/hr  . Short of breath on exertion    01-16-15 not a problem now.  . Urge urinary incontinence   . Wears glasses     Past Surgical History:  Procedure Laterality Date  . ABDOMINOPLASTY  1987  . APPENDECTOMY  1965  . BREAST SURGERY    . CARPAL TUNNEL RELEASE Right 07-08-2004  . COLONOSCOPY WITH ESOPHAGOGASTRODUODENOSCOPY (EGD)  2006  . EXPLANTATION BREAST IMPLANT AND DEBRIDEMENT  09-15-2005  . INTERSTIM IMPLANT PLACEMENT N/A 08/22/2014   Procedure: [name] IMPLANT STAGE ONE/TWO;  Surgeon: Jacqueline Packer, MD;  Location: [facility];  Service: Urology;  Laterality: N/A;  . MASTECTOMY    . MASTECTOMY, MODIFIED RADICAL W/RECONSTRUCTION  09-16-2003   left breast W/ SLN DISSECTION (pt states had 13 operations for implant infection   . REPLACE TISSUE EXPANDER LEFT BREAST AND TOTAL CAPSULECTOMY  08-05-2006  . REVISION BREAST RECONSTRUCTION Left 11-11-2004   12-03-2004  DRAINAGE SEREMA LEFT CHEST  . TOTAL HIP ARTHROPLASTY Left 07/17/2013   Procedure: LEFT TOTAL HIP ARTHROPLASTY ANTERIOR APPROACH;  Surgeon: Jacqueline Pole, MD;  Location: [facility];  Service: Orthopedics;  Laterality: Left;  . TOTAL HIP ARTHROPLASTY Right 01/28/2015   Procedure: RIGHT TOTAL  HIP ARTHROPLASTY ANTERIOR APPROACH;  Surgeon: Jacqueline Cancel, MD;  Location: [facility];  Service: Orthopedics;  Laterality: Right;  . TOTAL KNEE ARTHROPLASTY Left 11/11/2015   Procedure: LEFT TOTAL KNEE ARTHROPLASTY;  Surgeon: Jacqueline Cancel, MD;  Location: [facility];  Service: Orthopedics;  Laterality: Left;  . TOTAL SHOULDER ARTHROPLASTY  08/11/2012   Procedure: TOTAL SHOULDER ARTHROPLASTY;  Surgeon: Jacqueline Schooling, MD;  Location: [place];  Service: Orthopedics;  Laterality: Right;  RIGHT  TOTAL SHOULDER  ARTHROPLASTY   . TRANSTHORACIC ECHOCARDIOGRAM  02-25-2014   moderate LVH/  ef 55-60%/  mod. to sev. calcification AV with mild to moderate AV stenosis/   mild to mod. AR and MR/ mild PR/   moderate to severe LAE  . TUBAL LIGATION  1973     Allergies  Allergies  Allergen Reactions  . Beta Adrenergic Blockers Shortness Of Breath  . Betapace [Sotalol Hcl] Shortness Of Breath  . Other     Narcotic pain medications - anxiety attacks  . Oxycodone Shortness Of Breath    Tolerates Dilaudid, Fentanyl  . Crestor [Rosuvastatin Calcium] Other (See Comments)    Cough, leg cramps  . Adhesive [Tape] Rash    Adhesive bandaids   . Clonidine Derivatives Palpitations    bradycardia  . Latex Rash    Per patient  . Tramadol Anxiety    History of Present Illness    [name] states she was doing housework around noon yesterday when she had a sudden onset of substernal chest pain located in the enter of her chest. She states the pain was moderate and did not feel like any pain she has had previously. She states the pain was not relieved with rest. The pain was not worse with exertion or position. At approximately 10pm she took nitrol SL x 2 which relieved the chest pain for about an hour, but the chest pain returned so she went to the ER. Upon arrival, she was given morphine and nitro. POC troponin was negative and EKG showed rate controlled atrial fibrillation without significant ST changes.  On my interview, she is currently chest pain free. She states the pain was relieved with nitro and morphine given in the ER.  She denies SOB, palpitations, nausea, vomiting, diarrhea, constipation, cough, congestion, and edema.  She does report SOB when doing household activities.  She had an echo and a negative nuclear stress test in January 2017 before TKR surgery. She last saw [place] in clinic on 01/06/16 and was doing well; no medication changes at that time.   Inpatient Medications       Outpatient Medications    Prior to Admission medications   Medication Sig Start Date End Date Taking? Authorizing Provider  acetaminophen (TYLENOL) 325 MG tablet Take 1-2  tablets (325-650 mg total) by mouth every 4 (four) hours as needed for mild pain or moderate pain. 01/29/15  Yes Jacqueline Babish, PA-C  amoxicillin (AMOXIL) 500 MG capsule TAKE 4 CAPSULES BY MOUTH 1 HOUR BEFORE DENTAL APPT FOR PREMED 09/03/15  Yes Historical Provider, MD  atorvastatin (LIPITOR) 40 MG tablet Take 20 mg by mouth daily.    Yes Historical Provider, MD  Calcium Carbonate-Vitamin D (CALCIUM-VITAMIN D) 500-200 MG-UNIT tablet Take 1 tablet by mouth daily.   Yes Historical Provider, MD  clobetasol ointment (TEMOVATE) [id number] % Apply 1 application topically 2 (two) times daily as needed (for skin irritation).   Yes Historical Provider, MD  diltiazem (TIAZAC) 360 MG 24 hr capsule Take 360 mg by mouth  every evening.   Yes Historical Provider, MD  ferrous sulfate 325 (65 FE) MG tablet Take 1 tablet (325 mg total) by mouth 3 (three) times daily after meals. Patient taking differently: Take 325 mg by mouth daily.  11/12/15  Yes Jacqueline Babish, PA-C  furosemide (LASIX) 20 MG tablet TAKE 1 TABLET (20 MG TOTAL) BY MOUTH DAILY. 08/28/14  Yes Sueanne Margarita, MD  glucosamine-chondroitin 500-400 MG tablet Take 1 tablet by mouth 2 (two) times daily.   Yes Historical Provider, MD  losartan (COZAAR) 50 MG tablet Take 50 mg by mouth daily.   Yes Historical Provider, MD  Melatonin 10 MG TABS Take 1 tablet by mouth at bedtime.    Yes Historical Provider, MD  nitroGLYCERIN (NITROSTAT) 0.4 MG SL tablet Place 0.4 mg under the tongue every 5 (five) minutes as needed. For chest pain   Yes Historical Provider, MD  omeprazole (PRILOSEC) 20 MG capsule Take 20 mg by mouth every morning.    Yes Historical Provider, MD  polycarbophil (FIBER-CAPS) 625 MG tablet Take 625 mg by mouth daily.   Yes Historical Provider, MD  predniSONE (DELTASONE) 5 MG tablet Take 5 mg by mouth daily with breakfast.   Yes Historical Provider, MD  rivaroxaban (XARELTO) 20 MG TABS tablet Take 20 mg by mouth daily with supper.   Yes Historical Provider,  MD  traMADol (ULTRAM) 50 MG tablet Take 1-2 tablets (50-100 mg total) by mouth every 6 (six) hours as needed for moderate pain or severe pain. 11/12/15  Yes Danae Orleans, PA-C  triamcinolone cream (KENALOG) 0.1 % Apply 1 application topically as needed (for skin irritation).   Yes Historical Provider, MD     Family History    Family History  Problem Relation Age of Onset  . Heart disease Mother   . Heart disease Father     Social History    Social History   Social History  . Marital status: Married    Spouse name: N/A  . Number of children: N/A  . Years of education: N/A   Occupational History  . Not on file.   Social History Main Topics  . Smoking status: Former Smoker    Packs/day: 0.25    Years: 8.00    Types: Cigarettes    Quit date: 10/18/1966  . Smokeless tobacco: Never Used  . Alcohol use No  . Drug use: No  . Sexual activity: Yes   Other Topics Concern  . Not on file   Social History Narrative  . No narrative on file     Review of Systems    General:  No chills, fever, night sweats or weight changes.  Cardiovascular:  No current chest pain, dyspnea on exertion, edema, orthopnea, palpitations, paroxysmal nocturnal dyspnea. Dermatological: No rash, lesions/masses Respiratory: No cough, dyspnea Urologic: No hematuria, dysuria Abdominal:   No nausea, vomiting, diarrhea, bright red blood per rectum, melena, or hematemesis Neurologic:  No visual changes, wkns, changes in mental status. All other systems reviewed and are otherwise negative except as noted above.  Physical Exam    Blood pressure (!) 143/52, pulse (!) 106, temperature 97.4 F (36.3 C), temperature source Oral, resp. rate 18, SpO2 96 %.  General: Pleasant, NAD Psych: Normal affect. Neuro: Alert and oriented X 3. Moves all extremities spontaneously. HEENT: Normal  Neck: JVD to 9cm. Lungs:  Resp regular and unlabored, CTA. Heart: Irregular rhythm and regular rate, grade 3/6 holosystolic  murmur appreciated on the upper left and right sternal borders  Abdomen: Soft, non-tender, non-distended, BS + x 4.  Extremities: No clubbing, cyanosis or edema. DP/PT/Radials 2+ and equal bilaterally.  Labs    Troponin [facility] of Care Test)  Recent Labs  11/18/16 0508  TROPIPOC 0.03   No results for input(s): CKTOTAL, CKMB, TROPONINI in the last 72 hours. Lab Results  Component Value Date   WBC 10.3 11/18/2016   HGB 13.4 11/18/2016   HCT 40.9 11/18/2016   MCV 95.1 11/18/2016   PLT 214 11/18/2016    Recent Labs Lab 11/18/16 0445  NA 140  K 4.2  CL 107  CO2 26  BUN 17  CREATININE 0.87  CALCIUM 9.5  GLUCOSE 100*   No results found for: CHOL, HDL, LDLCALC, TRIG Lab Results  Component Value Date   DDIMER (H) 03/15/2011    0.56        AT THE INHOUSE ESTABLISHED CUTOFF VALUE OF 0.48 ug/mL FEU, THIS ASSAY HAS BEEN DOCUMENTED IN THE LITERATURE TO HAVE A SENSITIVITY AND NEGATIVE PREDICTIVE VALUE OF AT LEAST 98 TO 99%.  THE TEST RESULT SHOULD BE CORRELATED WITH AN ASSESSMENT OF THE CLINICAL PROBABILITY OF DVT / VTE.     Radiology Studies    Dg Chest 2 View  Result Date: 11/18/2016 CLINICAL DATA:  Mid chest pain tonight. EXAM: CHEST  2 VIEW COMPARISON:  08/16/2012 FINDINGS: Stable cardiomegaly. There is atherosclerosis of the thoracic aorta. No pulmonary edema, focal airspace opacity or pleural fluid. No pneumothorax. Surgical clips in the left breast/ axilla. Right humeral prosthesis. No acute osseous abnormality. IMPRESSION: No acute abnormality.  Stable cardiomegaly. Aortic atherosclerosis. Electronically Signed   By: Jeb Levering M.D.   On: 11/18/2016 03:23    ECG & Cardiac Imaging    EKG 11/18/16: Atrial fibrillation with a competing junctional pacemaker  Echocardiogram 11/06/15:  Study Conclusions - Left ventricle: The cavity size was normal. There was moderate   concentric hypertrophy. Systolic function was normal. The   estimated ejection fraction was in  the range of 55% to 60%. Wall   motion was normal; there were no regional wall motion   abnormalities. - Aortic valve: Transvalvular velocity was increased. There was   moderate stenosis. There was moderate regurgitation. Valve area   (VTI): 0.83 cm^2. Valve area (Vmax): 0.79 cm^2. Valve area   (Vmean): 0.69 cm^2. - Mitral valve: There was mild regurgitation. - Left atrium: The atrium was severely dilated. - Right ventricle: The cavity size was moderately dilated. Wall   thickness was normal. Systolic function was normal. - Right atrium: The atrium was dilated. - Tricuspid valve: There was mild regurgitation. - Pulmonic valve: There was mild regurgitation. - Pulmonary arteries: Systolic pressure was severely increased. PA   peak pressure: 53 mm Hg (S). - Inferior vena cava: The vessel was normal in size. The respirophasic diameter changes were in the normal range (= 50%), consistent with normal central venous pressure.   Myoview 11/06/15:   Nuclear stress EF: 54%.  There was no ST segment deviation noted during stress.  The study is normal.  The left ventricular ejection fraction is mildly decreased (45-54%).   Normal stress nuclear study with a small, mild, fixed apical defect consistent with thinning; no ischemia; EF 54 with normal wall motion.   Assessment & Plan    1. Chest pain - She states she had chest pain with her normal daily activities that was not relieved with rest, but was temporarily relieved with SL nitro - troponin was initially negative at 0500 today,  EKG without significant ST changes - will repeat troponin - given her AS and chest pain, she will likely need an echo and heart catheterization   2. Aortic stenosis - SOB with exertion - repeat echo to evaluate AS - possible evaluation for valve replacement   3. Diastolic heart failure - recommend repeat echocardiogram to assess left and right function - no edema on exam, but JVD appreciated - continue  lasix - she does not appear to be hypervolemic or in an acute exacerbation - BNP was 352 this admission   4. Chronic Atrial fibrillation - This patients CHA2DS2-VASc Score and unadjusted Ischemic Stroke Rate (% per year) is equal to 7.2 % stroke rate/year from a score of 5 (age, sex, HTN, CHF) - she has been rate controlled on diltiazem and is anticoagulated with xarelto   5. HTN - pt states her pressure has been controlled with losartan  Signed, Minette Brine, PA-C 11/18/2016, 7:38 AM   The patient was seen, examined and discussed with Bhagat,Bhavinkumar PA-C and I agree with the above.   [name] is a 81 yo female followed by Dr [name], with PMH significant for chronic Afib (on xarelto), HTN, chronic diastolic heart failure, aortic stenosis, HLD, obesity, GERD, and breast cancer. She presented to the ED 11/17/16 for sudden onset chest pain. She admits to worsening DOE in the last few months. She denies any exertional presyncope or syncope. She remains very active. Sl NTG resolved her pain.  TTE in 10/2015 showed normal LVEF, mean transaortic gradient 32 mmHg, but AVA 0.8 cm2. Physical exam reveals no signs of fluid overload, loud holosystolic murmur at the LUSB with S2 still present.  I have reviewed CT chest from 2012 that shows calcifications in all 3 coronary arteries.   Plan: Distinguish if this is sec to obstructive CAD or aortic stenosis. Evaluate for possible AVR/TAVR.  Hold Xarelto, start iv Heparin. Repeat TTE today to reevaluate severity of aortic stenosis. Plan for a left and right cath tomorrow.  Ena Dawley, MD  11/18/2016

## 2016-11-18 NOTE — H&P (Addendum)
History and Physical  Patient Name: [name]     [id number]    DOB: 1936/02/14    DOA: 11/18/2016 PCP: Jonathon Bellows, MD   Patient coming from: Home     Chief Complaint: Chest pain  HPI: [name] is a 81 y.o. female with a past medical history significant for chronic Afib on Xarelto, mild to mod AS, HFpEF, and HTN who presents with chest pain.  The patient was in her usual health (doing a lot of extra work around the house) until yesterday around [facility] when she had insidious onset of central chest pain, moderate in intensity, pressure or dull in character, not worse with exertion (walking up stairs) nor eating nor position.  This progressed through the evening, then around 10pm she took two nitro that relieved the pain, but it returned in about 1/2 hr, and so she came to the ER.  ED course: -Afebrile, heart rate 73, respirations and pulse ox normal, BP 152/88 -Initial ECG showed chronic afib, no ST changes and troponin was negative. -Na 140, K 4.2, Cr 0.9, WBC 10.3, Hgb 13.4 -CXR clear -She was given full aspirin and ondansetron and the pain gradually resovled in the ER -TRH was asked to admit for observation, serial troponins and risk stratification.         Review of Systems:  Review of Systems  Cardiovascular: Positive for chest pain.  All other systems reviewed and are negative.    Past Medical History:  Diagnosis Date  . Anginal pain ([place])    pt had with A Fib but has not occurred since beginning Xarelto  . Aortic stenosis    a. Echo 02/25/14:  Mod LVH, EF 55-60%, no RWMA (mean 16 mmHg) // b. Echo 1/17: moderate LVH, EF 55-60%, normal wall motion, moderate aortic stenosis (mean 32 mmHg, peak 52 mmHg), moderate AI, mild MR, severe LAE, normal RVSF, RAE, mild TR, mild PI, PASP 53 mmHg  . Arthritis   . Cancer [facility])    left breast mastectomy(cancer)-surgery only  . Chronic anticoagulation   . Chronic atrial fibrillation (HCC) CARDIOLOGIST -- DR  Tressia Miners TURNER   controlled  . Chronic diastolic CHF (congestive heart failure), NYHA class 1 ([place])   . Complication of anesthesia    extremely claustophobic(only occurs with narcotic medications as stated per pt)  . Diverticulosis   . Dysrhythmia   . Fibromyalgia   . GERD (gastroesophageal reflux disease)   . Hard of hearing    wears hearing aides   . Heart murmur    mild to mod AS, mild to mod. AR and MR, mild  TR per echo 02-25-2014  . History of breast cancer no recurrence   dx 2004   s/p  left breast mastectomy w/ node dissection/   no chemo or radiation  . History of nuclear stress test    Myoview 1/17: EF 54%, apical thinning, no ischemia, low risk  . History of TIA (transient ischemic attack)    per scan--  no residual  . Hyperlipidemia   . Hypertension   . Nocturia   . OSA on CPAP     severe/  AHI 34/hr  . Short of breath on exertion    01-16-15 not a problem now.  . Urge urinary incontinence   . Wears glasses     Past Surgical History:  Procedure Laterality Date  . ABDOMINOPLASTY  1987  . APPENDECTOMY  1965  . BREAST SURGERY    . CARPAL TUNNEL  RELEASE Right 07-08-2004  . COLONOSCOPY WITH ESOPHAGOGASTRODUODENOSCOPY (EGD)  2006  . EXPLANTATION BREAST IMPLANT AND DEBRIDEMENT  09-15-2005  . INTERSTIM IMPLANT PLACEMENT N/A 08/22/2014   Procedure: Barrie Lyme IMPLANT STAGE ONE/TWO;  Surgeon: Reece Packer, MD;  Location: [facility];  Service: Urology;  Laterality: N/A;  . MASTECTOMY    . MASTECTOMY, MODIFIED RADICAL W/RECONSTRUCTION  09-16-2003   left breast W/ SLN DISSECTION (pt states had 13 operations for implant infection   . REPLACE TISSUE EXPANDER LEFT BREAST AND TOTAL CAPSULECTOMY  08-05-2006  . REVISION BREAST RECONSTRUCTION Left 11-11-2004   12-03-2004  DRAINAGE SEREMA LEFT CHEST  . TOTAL HIP ARTHROPLASTY Left 07/17/2013   Procedure: LEFT TOTAL HIP ARTHROPLASTY ANTERIOR APPROACH;  Surgeon: Mauri Pole, MD;  Location: [facility];  Service:  Orthopedics;  Laterality: Left;  . TOTAL HIP ARTHROPLASTY Right 01/28/2015   Procedure: RIGHT TOTAL HIP ARTHROPLASTY ANTERIOR APPROACH;  Surgeon: Paralee Cancel, MD;  Location: [facility];  Service: Orthopedics;  Laterality: Right;  . TOTAL KNEE ARTHROPLASTY Left 11/11/2015   Procedure: LEFT TOTAL KNEE ARTHROPLASTY;  Surgeon: Paralee Cancel, MD;  Location: [facility];  Service: Orthopedics;  Laterality: Left;  . TOTAL SHOULDER ARTHROPLASTY  08/11/2012   Procedure: TOTAL SHOULDER ARTHROPLASTY;  Surgeon: Augustin Schooling, MD;  Location: [place];  Service: Orthopedics;  Laterality: Right;  RIGHT  TOTAL SHOULDER  ARTHROPLASTY   . TRANSTHORACIC ECHOCARDIOGRAM  02-25-2014   moderate LVH/  ef 55-60%/  mod. to sev. calcification AV with mild to moderate AV stenosis/  mild to mod. AR and MR/ mild PR/   moderate to severe LAE  . TUBAL LIGATION  1973    Social History: Patient lives with her husband.  Patient walks unassisted.   reports that she quit smoking about 50 years ago. Her smoking use included Cigarettes. She has a 2.00 pack-year smoking history. She has never used smokeless tobacco. She reports that she does not drink alcohol or use drugs.  Allergies  Allergen Reactions  . Beta Adrenergic Blockers Shortness Of Breath  . Betapace [Sotalol Hcl] Shortness Of Breath  . Other     Narcotic pain medications - anxiety attacks  . Oxycodone Shortness Of Breath    Tolerates Dilaudid, Fentanyl  . Crestor [Rosuvastatin Calcium] Other (See Comments)    Cough, leg cramps  . Adhesive [Tape] Rash    Adhesive bandaids   . Clonidine Derivatives Palpitations    bradycardia  . Latex Rash    Per patient  . Tramadol Anxiety    Family history: family history includes Heart disease in her father and mother.  Prior to Admission medications   Medication Sig Start Date End Date Taking? Authorizing Provider  acetaminophen (TYLENOL) 325 MG tablet Take 1-2 tablets (325-650 mg total) by mouth every 4 (four) hours as needed for  mild pain or moderate pain. 01/29/15  Yes Matthew Babish, PA-C  amoxicillin (AMOXIL) 500 MG capsule TAKE 4 CAPSULES BY MOUTH 1 HOUR BEFORE DENTAL APPT FOR PREMED 09/03/15  Yes Historical Provider, MD  atorvastatin (LIPITOR) 40 MG tablet Take 20 mg by mouth daily.    Yes Historical Provider, MD  Calcium Carbonate-Vitamin D (CALCIUM-VITAMIN D) 500-200 MG-UNIT tablet Take 1 tablet by mouth daily.   Yes Historical Provider, MD  clobetasol ointment (TEMOVATE) [id number] % Apply 1 application topically 2 (two) times daily as needed (for skin irritation).   Yes Historical Provider, MD  diltiazem (TIAZAC) 360 MG 24 hr capsule Take 360 mg by mouth every evening.  Yes Historical Provider, MD  ferrous sulfate 325 (65 FE) MG tablet Take 1 tablet (325 mg total) by mouth 3 (three) times daily after meals. Patient taking differently: Take 325 mg by mouth daily.  11/12/15  Yes Matthew Babish, PA-C  furosemide (LASIX) 20 MG tablet TAKE 1 TABLET (20 MG TOTAL) BY MOUTH DAILY. 08/28/14  Yes Sueanne Margarita, MD  glucosamine-chondroitin 500-400 MG tablet Take 1 tablet by mouth 2 (two) times daily.   Yes Historical Provider, MD  losartan (COZAAR) 50 MG tablet Take 50 mg by mouth daily.   Yes Historical Provider, MD  Melatonin 10 MG TABS Take 1 tablet by mouth at bedtime.    Yes Historical Provider, MD  nitroGLYCERIN (NITROSTAT) 0.4 MG SL tablet Place 0.4 mg under the tongue every 5 (five) minutes as needed. For chest pain   Yes Historical Provider, MD  omeprazole (PRILOSEC) 20 MG capsule Take 20 mg by mouth every morning.    Yes Historical Provider, MD  polycarbophil (FIBER-CAPS) 625 MG tablet Take 625 mg by mouth daily.   Yes Historical Provider, MD  predniSONE (DELTASONE) 5 MG tablet Take 5 mg by mouth daily with breakfast.   Yes Historical Provider, MD  rivaroxaban (XARELTO) 20 MG TABS tablet Take 20 mg by mouth daily with supper.   Yes Historical Provider, MD  traMADol (ULTRAM) 50 MG tablet Take 1-2 tablets (50-100 mg total)  by mouth every 6 (six) hours as needed for moderate pain or severe pain. 11/12/15  Yes Danae Orleans, PA-C  triamcinolone cream (KENALOG) 0.1 % Apply 1 application topically as needed (for skin irritation).   Yes Historical Provider, MD       Physical Exam: BP 134/62   Pulse (!) 50   Temp 97.3 F (36.3 C) (Oral)   Resp 17   SpO2 93%  General appearance: Well-developed, adult female, alert and in no acute distress.   Eyes: Anicteric, conjunctiva pink, lids and lashes normal.     ENT: No nasal deformity, discharge, or epistaxis.  OP moist without lesions.   Skin: Warm and dry.   Cardiac: RRR, nl [id number], systolic murmur appreciated.  Capillary refill is brisk.  JVP not visible.  No LE edema.  Radial and DP pulses 2+ and symmetric.  No carotid bruits. Respiratory: Normal respiratory rate and rhythm.  CTAB without rales or wheezes. GI: Abdomen soft without rigidity.  Mild epigastric TTP without guarding or rebound. No ascites, distension.   MSK: No deformities or effusions.   Pain not reproduced with palpation of precordium.  No pain with arm movement. Neuro: Sensorium intact and responding to questions, attention normal.  Speech is fluent.  Moves all extremities equally and with normal coordination.    Psych: Behavior appropriate.  Affect normal.  No evidence of aural or visual hallucinations or delusions.       Labs on Admission:  The metabolic panel shows normal electrolytes and renal function. The complete blood count shows normal blood counts. The initial troponin is negative.  Radiological Exams on Admission: Personally reviewed CXR shows no evidence of pneumonia: Dg Chest 2 View  Result Date: 11/18/2016 CLINICAL DATA:  Mid chest pain tonight. EXAM: CHEST  2 VIEW COMPARISON:  08/16/2012 FINDINGS: Stable cardiomegaly. There is atherosclerosis of the thoracic aorta. No pulmonary edema, focal airspace opacity or pleural fluid. No pneumothorax. Surgical clips in the left breast/  axilla. Right humeral prosthesis. No acute osseous abnormality. IMPRESSION: No acute abnormality.  Stable cardiomegaly. Aortic atherosclerosis. Electronically Signed   By:  Jeb Levering M.D.   On: 11/18/2016 03:23    EKG: Independently reviewed. Rate normal, atrial fibrillation, no ST changes.  Echo Jan 2017: EF 55% Moderate AS, Vmean 0.7 PAP 53       Assessment/Plan Principal Problem:   Chest pain Active Problems:   HTN (hypertension)   Atrial fibrillation, chronic (HCC)   OSA (obstructive sleep apnea)   Chronic diastolic CHF (congestive heart failure), NYHA class 1 ([place])  1. Chest pain: This is new.  The patient has HEART score of 4. Angina has typical and atypical features (not exertional, but dull and relieved with rest).  Other potential causes of chest pain (PE, dissection, pancreatitis, pneumonia/effusion, pericarditis) are doubted.  GERD or gastritis are also likely given her epigastric tenderness. -Serial troponins are ordered -Telemetry -Consult to cardiology, appreciate recommendations    2. Hypertension:  Normotensive at admission -Continue losartan, statin -Hold dilt  3. Afib:  CHADS2Vasc 6 on rivaroxaban. -Hold dilt -Continue Xarelto  4. Other medications:  -Continue PPI -Continue prednisone  5. HFpEF:  Congestive heart failure, diastolic.  Appears euvolemic. -Check BNP    DVT prophylaxis: Lovenox Diet: NPO after 4am for anticipated stress testing Code Status: FULL  Family Communication: Husband at bedside  Disposition Plan: Anticipate observation for arrhythmia on telemetry, serial troponins and subsequent risk stratification by Cardiology.  If testing negative, home after. Consults called: Cardiology Admission status: Telemetry, OBS   Medical decision making: Patient seen at 6:37 AM on 11/18/2016.  The patient was discussed with Dr. Leonides Schanz. What exists of the patient's chart was reviewed in depth.  Clinical condition: stable.       Edwin Dada [facility] Pager [phone]

## 2016-11-18 NOTE — Progress Notes (Signed)
Patient received from ED, husband at bedside. Tele monitoring complete, patient advised she is NPO. Oriented to room, call light within reach

## 2016-11-18 NOTE — Progress Notes (Signed)
[place] OF CARE NOTE Patient: [name] [id number]   PCP: Jonathon Bellows, MD DOB: [birth year]   DOA: 11/18/2016   DOS: 11/18/2016    Patient was admitted by my colleague Dr. Loleta Books earlier on 11/18/2016. I have reviewed the H&P as well as assessment and plan and agree with the same. Important changes in the plan are listed below.  Plan of care: Principal Problem:   Chest pain Active Problems:   HTN (hypertension)   Atrial fibrillation, chronic (HCC)   OSA (obstructive sleep apnea)   Chronic diastolic CHF (congestive heart failure), NYHA class 1 (HCC) cardiac cath for tomorrow.  Moderate to severe aortic stenosis by echo.    Author: Berle Mull, MD [facility] Pager: [phone] 11/18/2016 2:40 PM   If 7PM-7AM, please contact night-coverage at [email or web address], password [facility]

## 2016-11-18 NOTE — Progress Notes (Addendum)
ANTICOAGULATION CONSULT NOTE - Initial Consult  Pharmacy Consult for heparin  Indication: chest pain/ACS  Allergies  Allergen Reactions  . Beta Adrenergic Blockers Shortness Of Breath  . Betapace [Sotalol Hcl] Shortness Of Breath  . Other     Narcotic pain medications - anxiety attacks  . Oxycodone Shortness Of Breath    Tolerates Dilaudid, Fentanyl  . Crestor [Rosuvastatin Calcium] Other (See Comments)    Cough, leg cramps  . Adhesive [Tape] Rash    Adhesive bandaids   . Clonidine Derivatives Palpitations    bradycardia  . Latex Rash    Per patient  . Tramadol Anxiety    Patient Measurements: Weight: 173 lb 1 oz (78.5 kg) Ht: 5' 7''  Vital Signs: Temp: 97.4 F (36.3 C) (02/01 0644) Temp Source: Oral (02/01 0644) BP: 153/46 (02/01 0900) Pulse Rate: 53 (02/01 0900)  Labs:  Recent Labs  11/18/16 0445  HGB 13.4  HCT 40.9  PLT 214  CREATININE 0.87    CrCl cannot be calculated (Unknown ideal weight.).   Medical History: Past Medical History:  Diagnosis Date  . Anginal pain ([place])    pt had with A Fib but has not occurred since beginning Xarelto  . Aortic stenosis    a. Echo 02/25/14:  Mod LVH, EF 55-60%, no RWMA (mean 16 mmHg) // b. Echo 1/17: moderate LVH, EF 55-60%, normal wall motion, moderate aortic stenosis (mean 32 mmHg, peak 52 mmHg), moderate AI, mild MR, severe LAE, normal RVSF, RAE, mild TR, mild PI, PASP 53 mmHg  . Arthritis   . Cancer [facility])    left breast mastectomy(cancer)-surgery only  . Chronic anticoagulation   . Chronic atrial fibrillation (HCC) CARDIOLOGIST -- DR Tressia Miners TURNER   controlled  . Chronic diastolic CHF (congestive heart failure), NYHA class 1 ([place])   . Complication of anesthesia    extremely claustophobic(only occurs with narcotic medications as stated per pt)  . Diverticulosis   . Dysrhythmia   . Fibromyalgia   . GERD (gastroesophageal reflux disease)   . Hard of hearing    wears hearing aides   . Heart murmur    mild to  mod AS, mild to mod. AR and MR, mild  TR per echo 02-25-2014  . History of breast cancer no recurrence   dx 2004   s/p  left breast mastectomy w/ node dissection/   no chemo or radiation  . History of nuclear stress test    Myoview 1/17: EF 54%, apical thinning, no ischemia, low risk  . History of TIA (transient ischemic attack)    per scan--  no residual  . Hyperlipidemia   . Hypertension   . Nocturia   . OSA on CPAP     severe/  AHI 34/hr  . Short of breath on exertion    01-16-15 not a problem now.  . Urge urinary incontinence   . Wears glasses     Medications:  Prescriptions Prior to Admission  Medication Sig Dispense Refill Last Dose  . acetaminophen (TYLENOL) 325 MG tablet Take 1-2 tablets (325-650 mg total) by mouth every 4 (four) hours as needed for mild pain or moderate pain.   unk  . amoxicillin (AMOXIL) 500 MG capsule TAKE 4 CAPSULES BY MOUTH 1 HOUR BEFORE DENTAL APPT FOR PREMED  0 unk  . atorvastatin (LIPITOR) 40 MG tablet Take 20 mg by mouth daily.    11/17/2016 at Unknown time  . Calcium Carbonate-Vitamin D (CALCIUM-VITAMIN D) 500-200 MG-UNIT tablet Take 1 tablet by  mouth daily.   11/17/2016 at Unknown time  . clobetasol ointment (TEMOVATE) [id number] % Apply 1 application topically 2 (two) times daily as needed (for skin irritation).   unk  . diltiazem (TIAZAC) 360 MG 24 hr capsule Take 360 mg by mouth every evening.   11/17/2016 at Unknown time  . ferrous sulfate 325 (65 FE) MG tablet Take 1 tablet (325 mg total) by mouth 3 (three) times daily after meals. (Patient taking differently: Take 325 mg by mouth daily. )  3 11/17/2016 at Unknown time  . furosemide (LASIX) 20 MG tablet TAKE 1 TABLET (20 MG TOTAL) BY MOUTH DAILY. 90 tablet 1 11/17/2016 at Unknown time  . glucosamine-chondroitin 500-400 MG tablet Take 1 tablet by mouth 2 (two) times daily.   11/17/2016 at Unknown time  . losartan (COZAAR) 50 MG tablet Take 50 mg by mouth daily.   11/17/2016 at Unknown time  . Melatonin 10 MG  TABS Take 1 tablet by mouth at bedtime.    11/17/2016 at Unknown time  . nitroGLYCERIN (NITROSTAT) 0.4 MG SL tablet Place 0.4 mg under the tongue every 5 (five) minutes as needed. For chest pain   unk  . omeprazole (PRILOSEC) 20 MG capsule Take 20 mg by mouth every morning.    11/17/2016 at Unknown time  . polycarbophil (FIBER-CAPS) 625 MG tablet Take 625 mg by mouth daily.   11/17/2016 at Unknown time  . predniSONE (DELTASONE) 5 MG tablet Take 5 mg by mouth daily with breakfast.   11/17/2016 at Unknown time  . rivaroxaban (XARELTO) 20 MG TABS tablet Take 20 mg by mouth daily with supper.   11/17/2016 at Unknown time  . traMADol (ULTRAM) 50 MG tablet Take 1-2 tablets (50-100 mg total) by mouth every 6 (six) hours as needed for moderate pain or severe pain. 50 tablet 0 unk  . triamcinolone cream (KENALOG) 0.1 % Apply 1 application topically as needed (for skin irritation).   unk    Assessment: 81 yo female with CP to begin heparin for r/o ACS (also with aotic stenosis and for evaluation).  She is on Xarelto PTA with last dose on 1/31. Plans noted for cath on 2/2.   Goal of Therapy:  Heparin level 0.3-0.7 units/ml aPTT 66-102 seconds Monitor platelets by anticoagulation protocol: Yes   Plan:  -No heparin bolus -Begin heparin at 900 units/hr at 8pm -heparin level and aPTT in 8 hours  Hildred Laser, Pharm D 11/18/2016 11:25 AM

## 2016-11-18 NOTE — Progress Notes (Signed)
  Echocardiogram 2D Echocardiogram has been performed.  Tresa Res 11/18/2016, 12:27 PM

## 2016-11-18 NOTE — Care Management Obs Status (Signed)
[place] NOTIFICATION   Patient Details  Name: [name] MRN: [id number] Date of Birth: 1935/11/25   Medicare Observation Status Notification Given:  Yes    Dawayne Patricia, RN 11/18/2016, 3:12 PM

## 2016-11-19 ENCOUNTER — Encounter (HOSPITAL_COMMUNITY)
Admission: EM | Disposition: A | Discharge status: Home / Self Care | Payer: Self-pay | Source: Home / Self Care | Attending: Internal Medicine

## 2016-11-19 ENCOUNTER — Encounter (HOSPITAL_COMMUNITY): Payer: Self-pay | Admitting: Cardiovascular Disease

## 2016-11-19 DIAGNOSIS — I2510 Atherosclerotic heart disease of native coronary artery without angina pectoris: Secondary | ICD-10-CM

## 2016-11-19 DIAGNOSIS — I350 Nonrheumatic aortic (valve) stenosis: Secondary | ICD-10-CM

## 2016-11-19 DIAGNOSIS — I481 Persistent atrial fibrillation: Secondary | ICD-10-CM

## 2016-11-19 HISTORY — PX: CARDIAC CATHETERIZATION: SHX172

## 2016-11-19 LAB — POCT I-STAT 3, ART BLOOD GAS (G3+)
Acid-Base Excess: 2 mmol/L (ref 0.0–2.0)
Bicarbonate: 25.8 mmol/L (ref 20.0–28.0)
O2 SAT: 93 %
PH ART: 7.441 (ref 7.350–7.450)
TCO2: 27 mmol/L (ref 0–100)
pCO2 arterial: 37.9 mmHg (ref 32.0–48.0)
pO2, Arterial: 64 mmHg — ABNORMAL LOW (ref 83.0–108.0)

## 2016-11-19 LAB — CBC
HEMATOCRIT: 39.1 % (ref 36.0–46.0)
HEMOGLOBIN: 13 g/dL (ref 12.0–15.0)
MCH: 31.3 pg (ref 26.0–34.0)
MCHC: 33.2 g/dL (ref 30.0–36.0)
MCV: 94.2 fL (ref 78.0–100.0)
Platelets: 202 10*3/uL (ref 150–400)
RBC: 4.15 MIL/uL (ref 3.87–5.11)
RDW: 13.9 % (ref 11.5–15.5)
WBC: 8.4 10*3/uL (ref 4.0–10.5)

## 2016-11-19 LAB — POCT I-STAT 3, VENOUS BLOOD GAS (G3P V)
Acid-Base Excess: 3 mmol/L — ABNORMAL HIGH (ref 0.0–2.0)
Bicarbonate: 28.1 mmol/L — ABNORMAL HIGH (ref 20.0–28.0)
O2 Saturation: 65 %
PCO2 VEN: 45.2 mmHg (ref 44.0–60.0)
PH VEN: 7.401 (ref 7.250–7.430)
TCO2: 29 mmol/L (ref 0–100)
pO2, Ven: 34 mmHg (ref 32.0–45.0)

## 2016-11-19 LAB — HEPARIN LEVEL (UNFRACTIONATED): HEPARIN UNFRACTIONATED: 1.36 [IU]/mL — AB (ref 0.30–0.70)

## 2016-11-19 LAB — APTT: APTT: 67 s — AB (ref 24–36)

## 2016-11-19 LAB — POCT ACTIVATED CLOTTING TIME: Activated Clotting Time: 136 seconds

## 2016-11-19 SURGERY — RIGHT/LEFT HEART CATH AND CORONARY ANGIOGRAPHY

## 2016-11-19 MED ORDER — SODIUM CHLORIDE 0.9 % IV SOLN: 250.0000 mL | INTRAVENOUS | Status: DC | PRN

## 2016-11-19 MED ORDER — SODIUM CHLORIDE 0.9% FLUSH: 3.0000 mL | Freq: Two times a day (BID) | INTRAVENOUS | Status: DC

## 2016-11-19 MED ORDER — SODIUM CHLORIDE 0.9% FLUSH: 3.0000 mL | INTRAVENOUS | Status: DC | PRN

## 2016-11-19 MED ORDER — FENTANYL CITRATE (PF) 100 MCG/2ML IJ SOLN
INTRAMUSCULAR | Status: DC | PRN
  Administered 2016-11-19 (×2): 25 ug via INTRAVENOUS

## 2016-11-19 MED ORDER — LIDOCAINE HCL (PF) 1 % IJ SOLN
INTRAMUSCULAR | Status: DC | PRN
  Administered 2016-11-19: 18 mL via SUBCUTANEOUS

## 2016-11-19 MED ORDER — MIDAZOLAM HCL 2 MG/2ML IJ SOLN
INTRAMUSCULAR | Status: DC | PRN
Start: 2016-11-19 — End: 2016-11-19
  Administered 2016-11-19 (×2): 1 mg via INTRAVENOUS

## 2016-11-19 MED ORDER — HEPARIN (PORCINE) IN NACL 2-0.9 UNIT/ML-% IJ SOLN
INTRAMUSCULAR | Status: AC
  Filled 2016-11-19: qty 1000

## 2016-11-19 MED ORDER — ACETAMINOPHEN 325 MG PO TABS: 650.0000 mg | ORAL_TABLET | ORAL | Status: DC | PRN

## 2016-11-19 MED ORDER — IOPAMIDOL (ISOVUE-370) INJECTION 76%
INTRAVENOUS | Status: AC
Start: 2016-11-19 — End: 2016-11-19
  Filled 2016-11-19: qty 100

## 2016-11-19 MED ORDER — HEPARIN (PORCINE) IN NACL 100-0.45 UNIT/ML-% IJ SOLN
900.0000 [IU]/h | INTRAMUSCULAR | Status: DC
  Administered 2016-11-19: 900 [IU]/h via INTRAVENOUS

## 2016-11-19 MED ORDER — HEPARIN (PORCINE) IN NACL 2-0.9 UNIT/ML-% IJ SOLN
INTRAMUSCULAR | Status: DC | PRN
  Administered 2016-11-19: 1000 mL

## 2016-11-19 MED ORDER — IOPAMIDOL (ISOVUE-370) INJECTION 76%
INTRAVENOUS | Status: DC | PRN
  Administered 2016-11-19: 90 mL via INTRA_ARTERIAL

## 2016-11-19 MED ORDER — ONDANSETRON HCL 4 MG/2ML IJ SOLN: 4.0000 mg | Freq: Four times a day (QID) | INTRAMUSCULAR | Status: DC | PRN

## 2016-11-19 MED ORDER — DIAZEPAM 5 MG PO TABS: 5.0000 mg | ORAL_TABLET | Freq: Four times a day (QID) | ORAL | Status: DC | PRN

## 2016-11-19 MED ORDER — LIDOCAINE HCL (PF) 1 % IJ SOLN
INTRAMUSCULAR | Status: AC
  Filled 2016-11-19: qty 30

## 2016-11-19 MED ORDER — MIDAZOLAM HCL 2 MG/2ML IJ SOLN
INTRAMUSCULAR | Status: AC
  Filled 2016-11-19: qty 2

## 2016-11-19 MED ORDER — SODIUM CHLORIDE 0.9 % IV SOLN
INTRAVENOUS | Status: DC
  Administered 2016-11-19 (×2): via INTRAVENOUS

## 2016-11-19 MED ORDER — FENTANYL CITRATE (PF) 100 MCG/2ML IJ SOLN
INTRAMUSCULAR | Status: AC
  Filled 2016-11-19: qty 2

## 2016-11-19 SURGICAL SUPPLY — 14 items
CATH INFINITI 5 FR AR2 MOD (CATHETERS) ×1 IMPLANT
CATH INFINITI 5FR MULTPACK ANG (CATHETERS) ×1 IMPLANT
CATH SWAN GANZ 7F STRAIGHT (CATHETERS) ×1 IMPLANT
KIT HEART LEFT (KITS) ×2 IMPLANT
PACK CARDIAC CATHETERIZATION (CUSTOM PROCEDURE TRAY) ×2 IMPLANT
SHEATH PINNACLE 5F 10CM (SHEATH) ×1 IMPLANT
SHEATH PINNACLE 7F 10CM (SHEATH) ×1 IMPLANT
SYR 10CC CONTROL (SYRINGE) ×1 IMPLANT
SYR MEDRAD MARK V 150ML (SYRINGE) ×2 IMPLANT
TRANSDUCER W/STOPCOCK (MISCELLANEOUS) ×2 IMPLANT
TUBING CIL FLEX 10 FLL-RA (TUBING) ×2 IMPLANT
WIRE EMERALD 3MM-J .025X260CM (WIRE) ×1 IMPLANT
WIRE EMERALD 3MM-J .035X150CM (WIRE) ×1 IMPLANT
WIRE EMERALD ST .035X150CM (WIRE) ×1 IMPLANT

## 2016-11-19 NOTE — Progress Notes (Signed)
[facility] Progress Note  Patient: [name] [id number]   PCP: Jonathon Bellows, MD DOB: Mar 10, 1936   DOA: 11/18/2016   DOS: 11/19/2016   Date of Service: the patient was seen and examined on 11/19/2016   Subjective: Feeling better, no chest pain and abdominal pain. On shortness of breath.  Brief hospital course: Pt. with PMH of chronic A. fib, HTN, OSA, chronic diastolic CHF; admitted on 11/18/2016, with complaint of chest pain, was found to have severe aortic stenosis. Currently further plan is evaluation for TAVR.  Assessment and Plan: 1. Chest pain  Severe aortic stenosis. Patient's symptoms likely associated with aortic stenosis. Patient underwent right and left cardiac catheterization, no significance blockages Cardiology recommending TAVR, Awaiting evaluation by cardiac thoracic surgery.  2. Hypertension. Currently on losartan, diltiazem is currently on hold.  3. Non-Obstructive coronary artery disease. S/P cardiac catheterization. Currently on Xarelto. Likely resume tomorrow due to groin procedure. Continue statin.  4. A. fib. On Xarelto currently on hold due to requiring cardiac catheterization. Xarelto likely resume tomorrow due to groin procedure.  Bowel regimen: last BM 11/18/2016 Diet: cardiac diet DVT Prophylaxis: on therapeutic anticoagulation.  Advance goals of care discussion: full code  Family Communication: no family was present at bedside, at the time of interview.   Disposition:  Discharge to home. Expected discharge date: 11/20/2016, or to 11/23/2016 depending on the thoracic evaluation  Consultants: cardiology cardiothoracic surgery Procedures: Left and right cardiac cath  Antibiotics: Anti-infectives    None        Objective: Physical Exam: Vitals:   11/19/16 1030 11/19/16 1048 11/19/16 1154 11/19/16 1234  BP: (!) 144/84 (!) 154/88 (!) 152/71 (!) 144/76  Pulse:  77  (!) 103  Resp:      Temp:      TempSrc:      SpO2:  97%  98% 100%  Weight:        Intake/Output Summary (Last 24 hours) at 11/19/16 1529 Last data filed at 11/19/16 0910  Gross per 24 hour  Intake              240 ml  Output              200 ml  Net               40 ml   Filed Weights   11/18/16 0900  Weight: 78.5 kg (173 lb 1 oz)    General: Alert, Awake and Oriented to Time, Place and Person. Appear in mild distress, affect appropriate Eyes: PERRL, Conjunctiva normal ENT: Oral Mucosa clear moist Neck: no JVD, no Abnormal Mass Or lumps Cardiovascular: S1 and S2 Present, aortic systolic Murmur, Respiratory: Bilateral Air entry equal and Decreased, no use of accessory muscle, Clear to Auscultation, no Crackles, no wheezes Abdomen: Bowel Sound present, Soft and no tenderness Skin: no redness, no Rash, no induration Extremities: no Pedal edema, no calf tenderness Neurologic: Grossly no focal neuro deficit. Bilaterally Equal motor strength  Data Reviewed: CBC:  Recent Labs Lab 11/18/16 0445 11/19/16 0305  WBC 10.3 8.4  HGB 13.4 13.0  HCT 40.9 39.1  MCV 95.1 94.2  PLT 214 [id number]   Basic Metabolic Panel:  Recent Labs Lab 11/18/16 0445  NA 140  K 4.2  CL 107  CO2 26  GLUCOSE 100*  BUN 17  CREATININE 0.87  CALCIUM 9.5    Liver Function Tests: No results for input(s): AST, ALT, ALKPHOS, BILITOT, PROT, ALBUMIN in the last 168 hours. No  results for input(s): LIPASE, AMYLASE in the last 168 hours. No results for input(s): AMMONIA in the last 168 hours. Coagulation Profile:  Recent Labs Lab 11/18/16 1052  INR 1.83   Cardiac Enzymes:  Recent Labs Lab 11/18/16 1052  TROPONINI 0.06*   BNP (last 3 results) No results for input(s): PROBNP in the last 8760 hours.  CBG: No results for input(s): GLUCAP in the last 168 hours.  Studies: No results found.   Scheduled Meds: . atorvastatin  20 mg Oral Daily  . furosemide  20 mg Oral Daily  . losartan  50 mg Oral Daily  . Melatonin  9 mg Oral QHS  . pantoprazole  40  mg Oral Daily  . predniSONE  5 mg Oral Q breakfast  . sodium chloride flush  3 mL Intravenous Q12H   Continuous Infusions: . sodium chloride 75 mL/hr at 11/19/16 1528  . heparin     PRN Meds: sodium chloride, acetaminophen, diazepam, gi cocktail, nitroGLYCERIN, ondansetron (ZOFRAN) IV, sodium chloride flush  Time spent: 30 minutes  Author: Berle Mull, MD [facility] Pager: [phone] 11/19/2016 3:29 PM  If 7PM-7AM, please contact night-coverage at [email or web address], password [facility]

## 2016-11-19 NOTE — H&P (View-Only) (Signed)
Cardiology Consult    Patient ID: BRALEY DIMURO MRN: [id number], DOB/AGE: 11-27-1935   Admit date: 11/18/2016 Date of Consult: 11/18/2016  Primary Physician: Jonathon Bellows, MD Primary Cardiologist: Dr. Radford Pax Requesting Provider: Dr. Posey Pronto  Patient Profile    Ms Erber is a 81 yo female well-known to our service with a PMH significant for chronic Afib (on xarelto), HTN, chronic diastolic heart failure, aortic stenosis, HLD, obesity, GERD, and breast cancer. She presented to the ED 11/17/16 for sudden onset chest pain. Cardiology was consulted for ACS workup.  Past Medical History   Past Medical History:  Diagnosis Date  . Anginal pain ([place])    pt had with A Fib but has not occurred since beginning Xarelto  . Aortic stenosis    a. Echo 02/25/14:  Mod LVH, EF 55-60%, no RWMA (mean 16 mmHg) // b. Echo 1/17: moderate LVH, EF 55-60%, normal wall motion, moderate aortic stenosis (mean 32 mmHg, peak 52 mmHg), moderate AI, mild MR, severe LAE, normal RVSF, RAE, mild TR, mild PI, PASP 53 mmHg  . Arthritis   . Cancer [facility])    left breast mastectomy(cancer)-surgery only  . Chronic anticoagulation   . Chronic atrial fibrillation (HCC) CARDIOLOGIST -- DR Tressia Miners TURNER   controlled  . Chronic diastolic CHF (congestive heart failure), NYHA class 1 ([place])   . Complication of anesthesia    extremely claustophobic(only occurs with narcotic medications as stated per pt)  . Diverticulosis   . Dysrhythmia   . Fibromyalgia   . GERD (gastroesophageal reflux disease)   . Hard of hearing    wears hearing aides   . Heart murmur    mild to mod AS, mild to mod. AR and MR, mild  TR per echo 02-25-2014  . History of breast cancer no recurrence   dx 2004   s/p  left breast mastectomy w/ node dissection/   no chemo or radiation  . History of nuclear stress test    Myoview 1/17: EF 54%, apical thinning, no ischemia, low risk  . History of TIA (transient ischemic attack)    per scan--  no residual  .  Hyperlipidemia   . Hypertension   . Nocturia   . OSA on CPAP     severe/  AHI 34/hr  . Short of breath on exertion    01-16-15 not a problem now.  . Urge urinary incontinence   . Wears glasses     Past Surgical History:  Procedure Laterality Date  . ABDOMINOPLASTY  1987  . APPENDECTOMY  1965  . BREAST SURGERY    . CARPAL TUNNEL RELEASE Right 07-08-2004  . COLONOSCOPY WITH ESOPHAGOGASTRODUODENOSCOPY (EGD)  2006  . EXPLANTATION BREAST IMPLANT AND DEBRIDEMENT  09-15-2005  . INTERSTIM IMPLANT PLACEMENT N/A 08/22/2014   Procedure: Barrie Lyme IMPLANT STAGE ONE/TWO;  Surgeon: Reece Packer, MD;  Location: [facility];  Service: Urology;  Laterality: N/A;  . MASTECTOMY    . MASTECTOMY, MODIFIED RADICAL W/RECONSTRUCTION  09-16-2003   left breast W/ SLN DISSECTION (pt states had 13 operations for implant infection   . REPLACE TISSUE EXPANDER LEFT BREAST AND TOTAL CAPSULECTOMY  08-05-2006  . REVISION BREAST RECONSTRUCTION Left 11-11-2004   12-03-2004  DRAINAGE SEREMA LEFT CHEST  . TOTAL HIP ARTHROPLASTY Left 07/17/2013   Procedure: LEFT TOTAL HIP ARTHROPLASTY ANTERIOR APPROACH;  Surgeon: Mauri Pole, MD;  Location: [facility];  Service: Orthopedics;  Laterality: Left;  . TOTAL HIP ARTHROPLASTY Right 01/28/2015   Procedure: RIGHT TOTAL  HIP ARTHROPLASTY ANTERIOR APPROACH;  Surgeon: Paralee Cancel, MD;  Location: [facility];  Service: Orthopedics;  Laterality: Right;  . TOTAL KNEE ARTHROPLASTY Left 11/11/2015   Procedure: LEFT TOTAL KNEE ARTHROPLASTY;  Surgeon: Paralee Cancel, MD;  Location: [facility];  Service: Orthopedics;  Laterality: Left;  . TOTAL SHOULDER ARTHROPLASTY  08/11/2012   Procedure: TOTAL SHOULDER ARTHROPLASTY;  Surgeon: Augustin Schooling, MD;  Location: [facility];  Service: Orthopedics;  Laterality: Right;  RIGHT  TOTAL SHOULDER  ARTHROPLASTY   . TRANSTHORACIC ECHOCARDIOGRAM  02-25-2014   moderate LVH/  ef 55-60%/  mod. to sev. calcification AV with mild to moderate AV stenosis/   mild to mod. AR and MR/ mild PR/   moderate to severe LAE  . TUBAL LIGATION  1973     Allergies  Allergies  Allergen Reactions  . Beta Adrenergic Blockers Shortness Of Breath  . Betapace [Sotalol Hcl] Shortness Of Breath  . Other     Narcotic pain medications - anxiety attacks  . Oxycodone Shortness Of Breath    Tolerates Dilaudid, Fentanyl  . Crestor [Rosuvastatin Calcium] Other (See Comments)    Cough, leg cramps  . Adhesive [Tape] Rash    Adhesive bandaids   . Clonidine Derivatives Palpitations    bradycardia  . Latex Rash    Per patient  . Tramadol Anxiety    History of Present Illness    Ms Hopf states she was doing housework around noon yesterday when she had a sudden onset of substernal chest pain located in the enter of her chest. She states the pain was moderate and did not feel like any pain she has had previously. She states the pain was not relieved with rest. The pain was not worse with exertion or position. At approximately 10pm she took nitrol SL x 2 which relieved the chest pain for about an hour, but the chest pain returned so she went to the ER. Upon arrival, she was given morphine and nitro. POC troponin was negative and EKG showed rate controlled atrial fibrillation without significant ST changes.  On my interview, she is currently chest pain free. She states the pain was relieved with nitro and morphine given in the ER.  She denies SOB, palpitations, nausea, vomiting, diarrhea, constipation, cough, congestion, and edema.  She does report SOB when doing household activities.  She had an echo and a negative nuclear stress test in January 2017 before TKR surgery. She last saw [place] in clinic on 01/06/16 and was doing well; no medication changes at that time.   Inpatient Medications       Outpatient Medications    Prior to Admission medications   Medication Sig Start Date End Date Taking? Authorizing Provider  acetaminophen (TYLENOL) 325 MG tablet Take 1-2  tablets (325-650 mg total) by mouth every 4 (four) hours as needed for mild pain or moderate pain. 01/29/15  Yes Matthew Babish, PA-C  amoxicillin (AMOXIL) 500 MG capsule TAKE 4 CAPSULES BY MOUTH 1 HOUR BEFORE DENTAL APPT FOR PREMED 09/03/15  Yes Historical Provider, MD  atorvastatin (LIPITOR) 40 MG tablet Take 20 mg by mouth daily.    Yes Historical Provider, MD  Calcium Carbonate-Vitamin D (CALCIUM-VITAMIN D) 500-200 MG-UNIT tablet Take 1 tablet by mouth daily.   Yes Historical Provider, MD  clobetasol ointment (TEMOVATE) [id number] % Apply 1 application topically 2 (two) times daily as needed (for skin irritation).   Yes Historical Provider, MD  diltiazem (TIAZAC) 360 MG 24 hr capsule Take 360 mg by mouth  every evening.   Yes Historical Provider, MD  ferrous sulfate 325 (65 FE) MG tablet Take 1 tablet (325 mg total) by mouth 3 (three) times daily after meals. Patient taking differently: Take 325 mg by mouth daily.  11/12/15  Yes Matthew Babish, PA-C  furosemide (LASIX) 20 MG tablet TAKE 1 TABLET (20 MG TOTAL) BY MOUTH DAILY. 08/28/14  Yes Sueanne Margarita, MD  glucosamine-chondroitin 500-400 MG tablet Take 1 tablet by mouth 2 (two) times daily.   Yes Historical Provider, MD  losartan (COZAAR) 50 MG tablet Take 50 mg by mouth daily.   Yes Historical Provider, MD  Melatonin 10 MG TABS Take 1 tablet by mouth at bedtime.    Yes Historical Provider, MD  nitroGLYCERIN (NITROSTAT) 0.4 MG SL tablet Place 0.4 mg under the tongue every 5 (five) minutes as needed. For chest pain   Yes Historical Provider, MD  omeprazole (PRILOSEC) 20 MG capsule Take 20 mg by mouth every morning.    Yes Historical Provider, MD  polycarbophil (FIBER-CAPS) 625 MG tablet Take 625 mg by mouth daily.   Yes Historical Provider, MD  predniSONE (DELTASONE) 5 MG tablet Take 5 mg by mouth daily with breakfast.   Yes Historical Provider, MD  rivaroxaban (XARELTO) 20 MG TABS tablet Take 20 mg by mouth daily with supper.   Yes Historical Provider,  MD  traMADol (ULTRAM) 50 MG tablet Take 1-2 tablets (50-100 mg total) by mouth every 6 (six) hours as needed for moderate pain or severe pain. 11/12/15  Yes Danae Orleans, PA-C  triamcinolone cream (KENALOG) 0.1 % Apply 1 application topically as needed (for skin irritation).   Yes Historical Provider, MD     Family History    Family History  Problem Relation Age of Onset  . Heart disease Mother   . Heart disease Father     Social History    Social History   Social History  . Marital status: Married    Spouse name: N/A  . Number of children: N/A  . Years of education: N/A   Occupational History  . Not on file.   Social History Main Topics  . Smoking status: Former Smoker    Packs/day: 0.25    Years: 8.00    Types: Cigarettes    Quit date: 10/18/1966  . Smokeless tobacco: Never Used  . Alcohol use No  . Drug use: No  . Sexual activity: Yes   Other Topics Concern  . Not on file   Social History Narrative  . No narrative on file     Review of Systems    General:  No chills, fever, night sweats or weight changes.  Cardiovascular:  No current chest pain, dyspnea on exertion, edema, orthopnea, palpitations, paroxysmal nocturnal dyspnea. Dermatological: No rash, lesions/masses Respiratory: No cough, dyspnea Urologic: No hematuria, dysuria Abdominal:   No nausea, vomiting, diarrhea, bright red blood per rectum, melena, or hematemesis Neurologic:  No visual changes, wkns, changes in mental status. All other systems reviewed and are otherwise negative except as noted above.  Physical Exam    Blood pressure (!) 143/52, pulse (!) 106, temperature 97.4 F (36.3 C), temperature source Oral, resp. rate 18, SpO2 96 %.  General: Pleasant, NAD Psych: Normal affect. Neuro: Alert and oriented X 3. Moves all extremities spontaneously. HEENT: Normal  Neck: JVD to 9cm. Lungs:  Resp regular and unlabored, CTA. Heart: Irregular rhythm and regular rate, grade 3/6 holosystolic  murmur appreciated on the upper left and right sternal borders  Abdomen: Soft, non-tender, non-distended, BS + x 4.  Extremities: No clubbing, cyanosis or edema. DP/PT/Radials 2+ and equal bilaterally.  Labs    Troponin [facility] Test)  Recent Labs  11/18/16 0508  TROPIPOC 0.03   No results for input(s): CKTOTAL, CKMB, TROPONINI in the last 72 hours. Lab Results  Component Value Date   WBC 10.3 11/18/2016   HGB 13.4 11/18/2016   HCT 40.9 11/18/2016   MCV 95.1 11/18/2016   PLT 214 11/18/2016    Recent Labs Lab 11/18/16 0445  NA 140  K 4.2  CL 107  CO2 26  BUN 17  CREATININE 0.87  CALCIUM 9.5  GLUCOSE 100*   No results found for: CHOL, HDL, LDLCALC, TRIG Lab Results  Component Value Date   DDIMER (H) 03/15/2011    0.56        AT THE INHOUSE ESTABLISHED CUTOFF VALUE OF 0.48 ug/mL FEU, THIS ASSAY HAS BEEN DOCUMENTED IN THE LITERATURE TO HAVE A SENSITIVITY AND NEGATIVE PREDICTIVE VALUE OF AT LEAST 98 TO 99%.  THE TEST RESULT SHOULD BE CORRELATED WITH AN ASSESSMENT OF THE CLINICAL PROBABILITY OF DVT / VTE.     Radiology Studies    Dg Chest 2 View  Result Date: 11/18/2016 CLINICAL DATA:  Mid chest pain tonight. EXAM: CHEST  2 VIEW COMPARISON:  08/16/2012 FINDINGS: Stable cardiomegaly. There is atherosclerosis of the thoracic aorta. No pulmonary edema, focal airspace opacity or pleural fluid. No pneumothorax. Surgical clips in the left breast/ axilla. Right humeral prosthesis. No acute osseous abnormality. IMPRESSION: No acute abnormality.  Stable cardiomegaly. Aortic atherosclerosis. Electronically Signed   By: Jeb Levering M.D.   On: 11/18/2016 03:23    ECG & Cardiac Imaging    EKG 11/18/16: Atrial fibrillation with a competing junctional pacemaker  Echocardiogram 11/06/15:  Study Conclusions - Left ventricle: The cavity size was normal. There was moderate   concentric hypertrophy. Systolic function was normal. The   estimated ejection fraction was in  the range of 55% to 60%. Wall   motion was normal; there were no regional wall motion   abnormalities. - Aortic valve: Transvalvular velocity was increased. There was   moderate stenosis. There was moderate regurgitation. Valve area   (VTI): 0.83 cm^2. Valve area (Vmax): 0.79 cm^2. Valve area   (Vmean): 0.69 cm^2. - Mitral valve: There was mild regurgitation. - Left atrium: The atrium was severely dilated. - Right ventricle: The cavity size was moderately dilated. Wall   thickness was normal. Systolic function was normal. - Right atrium: The atrium was dilated. - Tricuspid valve: There was mild regurgitation. - Pulmonic valve: There was mild regurgitation. - Pulmonary arteries: Systolic pressure was severely increased. PA   peak pressure: 53 mm Hg (S). - Inferior vena cava: The vessel was normal in size. The respirophasic diameter changes were in the normal range (= 50%), consistent with normal central venous pressure.   Myoview 11/06/15:   Nuclear stress EF: 54%.  There was no ST segment deviation noted during stress.  The study is normal.  The left ventricular ejection fraction is mildly decreased (45-54%).   Normal stress nuclear study with a small, mild, fixed apical defect consistent with thinning; no ischemia; EF 54 with normal wall motion.   Assessment & Plan    1. Chest pain - She states she had chest pain with her normal daily activities that was not relieved with rest, but was temporarily relieved with SL nitro - troponin was initially negative at 0500 today,  EKG without significant ST changes - will repeat troponin - given her AS and chest pain, she will likely need an echo and heart catheterization   2. Aortic stenosis - SOB with exertion - repeat echo to evaluate AS - possible evaluation for valve replacement   3. Diastolic heart failure - recommend repeat echocardiogram to assess left and right function - no edema on exam, but JVD appreciated - continue  lasix - she does not appear to be hypervolemic or in an acute exacerbation - BNP was 352 this admission   4. Chronic Atrial fibrillation - This patients CHA2DS2-VASc Score and unadjusted Ischemic Stroke Rate (% per year) is equal to 7.2 % stroke rate/year from a score of 5 (age, sex, HTN, CHF) - she has been rate controlled on diltiazem and is anticoagulated with xarelto   5. HTN - pt states her pressure has been controlled with losartan  Signed, Minette Brine, PA-C 11/18/2016, 7:38 AM   The patient was seen, examined and discussed with Bhagat,Bhavinkumar PA-C and I agree with the above.   Ms Kozal is a 81 yo female followed by Dr Radford Pax, with PMH significant for chronic Afib (on xarelto), HTN, chronic diastolic heart failure, aortic stenosis, HLD, obesity, GERD, and breast cancer. She presented to the ED 11/17/16 for sudden onset chest pain. She admits to worsening DOE in the last few months. She denies any exertional presyncope or syncope. She remains very active. Sl NTG resolved her pain.  TTE in 10/2015 showed normal LVEF, mean transaortic gradient 32 mmHg, but AVA 0.8 cm2. Physical exam reveals no signs of fluid overload, loud holosystolic murmur at the LUSB with S2 still present.  I have reviewed CT chest from 2012 that shows calcifications in all 3 coronary arteries.   Plan: Distinguish if this is sec to obstructive CAD or aortic stenosis. Evaluate for possible AVR/TAVR.  Hold Xarelto, start iv Heparin. Repeat TTE today to reevaluate severity of aortic stenosis. Plan for a left and right cath tomorrow.  Ena Dawley, MD  11/18/2016

## 2016-11-19 NOTE — Interval H&P Note (Signed)
Cath Lab Visit (complete for each Cath Lab visit)  Clinical Evaluation Leading to the Procedure:   ACS: No.  Non-ACS:    Anginal Classification: CCS III  Anti-ischemic medical therapy: Minimal Therapy (1 class of medications)  Non-Invasive Test Results: Low-risk stress test findings: cardiac mortality <1%/year  Prior CABG: No previous CABG      History and Physical Interval Note:  11/19/2016 7:40 AM  [name]  has presented today for surgery, with the diagnosis of cp  The various methods of treatment have been discussed with the patient and family. After consideration of risks, benefits and other options for treatment, the patient has consented to  Procedure(s): Right/Left Heart Cath and Coronary Angiography (N/A) as a surgical intervention .  The patient's history has been reviewed, patient examined, no change in status, stable for surgery.  I have reviewed the patient's chart and labs.  Questions were answered to the patient's satisfaction.     Shelva Majestic

## 2016-11-19 NOTE — Consult Note (Addendum)
[address]       ,[place] [address]             [phone]          CARDIOTHORACIC SURGERY CONSULTATION REPORT  PCP is WEBB, CAROL D, MD Referring Provider is Dorothy Spark, MD Primary Cardiologist is TURNER, Eber Hong, MD  Reason for consultation:  Severe aortic stenosis, CAD, and atrial fibrillation  HPI:  Patient is a 81 year old obese female with history of aortic stenosis, long standing persistent atrial fibrillation on chronic anticoagulation, chronic diastolic congestive heart failure, obstructive sleep apnea on CPAP, hypertension, arthritis, and remote history of breast cancer who has been referred for surgical consultation. The patient has been followed for several years by Dr. Radford Pax. Previous echocardiograms have documented the presence of normal left ventricular systolic function with mild to moderate aortic stenosis. She has been treated for long-standing persistent atrial fibrillation using rate control and chronic anticoagulation. She has done fairly well and remained fairly active.  She was last seen in the office for routine follow-up on 01/06/2016 by Truitt Merle.    Over the past couple of months the patient has noticed some gradual progression of symptoms of exertional shortness of breath. She states that symptoms typically occur primarily only with more strenuous exertion, such as walking at a brisk pace. She denies any episodes of resting shortness of breath, PND, orthopnea, or lower extremity edema. She has not had dizzy spells, palpitations, nor syncope.  She was otherwise in her usual state of health until the evening of 11/17/2016 when she developed sudden onset of epigastric chest discomfort. She has never previously had any similar episodes. She took sublingual nitroglycerin and experienced some relief but the pain did not resolve completely and it recurred in the early morning hours of 11/18/2016. She ultimately presented to the emergency  department where symptoms were again relieved by administration of nitroglycerin and morphine. Baseline EKG revealed rate controlled atrial fibrillation without significant ST segment changes. Initial troponin was negative although follow-up troponin level was very slightly elevated to 0.06. The patient was admitted to the hospital and has remained pain-free ever since. Transthoracic echocardiogram performed 11/18/2016 revealed severe aortic stenosis, moderate aortic insufficiency, mild mitral regurgitation and normal left ventricular systolic function with moderate left ventricular hypertrophy.  Left and right heart catheterization performed 11/19/2016 confirmed the presence of aortic stenosis with peak to peak and mean transvalvular gradients reported 29 and 21.7 mmHg respectively, corresponding to aortic valve area calculated 0.8 cm. The patient was noted to have 70% focal ostial stenosis of the first diagonal branch of the left anterior descending coronary artery, 30-40% stenosis of the proximal right coronary artery, and otherwise very mild nonobstructive coronary artery disease. There was mild pulmonary hypertension. Cardiothoracic surgical consultation was requested.  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 She has lived in [place] for nearly 20 years. Her husband worked for many years for [employer].  The patient has been retired for many years but remained physically active. She enjoys playing golf and keeping herself busy with her husband and family. They have 5 adult children and numerous grandchildren. She reports no significant physical limitations other than that of chronic exertional shortness of breath and fatigue. These symptoms have progressed in recent months but she still gets short of breath with more strenuous exertion.    Past Medical History:  Diagnosis Date  . Anginal pain ([facility])   . Aortic  stenosis   . Arthritis   . Atrial fibrillation,  long-standing persistent ([facility])   . Cancer [facility])    left breast mastectomy(cancer)-surgery only  . Chest pain 11/18/2016  . Chronic anticoagulation   . Chronic atrial fibrillation ([facility]) CARDIOLOGIST -- DR Tressia Miners TURNER   controlled  . Chronic diastolic congestive heart failure ([place])   . Complication of anesthesia    extremely claustophobic(only occurs with narcotic medications as stated per pt)  . Diverticulosis   . Dysrhythmia   . Fibromyalgia   . GERD (gastroesophageal reflux disease)   . Hard of hearing    wears hearing aides   . Heart murmur    mild to mod AS, mild to mod. AR and MR, mild  TR per echo 02-25-2014  . History of breast cancer no recurrence   dx 2004   s/p  left breast mastectomy w/ node dissection/   no chemo or radiation  . History of nuclear stress test    Myoview 1/17: EF 54%, apical thinning, no ischemia, low risk  . History of TIA (transient ischemic attack)    per scan--  no residual  . Hyperlipidemia   . Hypertension   . Nocturia   . OSA on CPAP     severe/  AHI 34/hr  . Short of breath on exertion    01-16-15 not a problem now.  . Urge urinary incontinence   . Wears glasses     Past Surgical History:  Procedure Laterality Date  . ABDOMINOPLASTY  1987  . APPENDECTOMY  1965  . BREAST SURGERY    . CARDIAC CATHETERIZATION N/A 11/19/2016   Procedure: Right/Left Heart Cath and Coronary Angiography;  Surgeon: Troy Sine, MD;  Location: [facility];  Service: Cardiovascular;  Laterality: N/A;  . CARPAL TUNNEL RELEASE Right 07-08-2004  . COLONOSCOPY WITH ESOPHAGOGASTRODUODENOSCOPY (EGD)  2006  . EXPLANTATION BREAST IMPLANT AND DEBRIDEMENT  09-15-2005  . INTERSTIM IMPLANT PLACEMENT N/A 08/22/2014   Procedure: Barrie Lyme IMPLANT STAGE ONE/TWO;  Surgeon: Reece Packer, MD;  Location: [facility];  Service: Urology;  Laterality: N/A;  . MASTECTOMY    . MASTECTOMY, MODIFIED RADICAL W/RECONSTRUCTION  09-16-2003   left breast W/ SLN  DISSECTION (pt states had 13 operations for implant infection   . REPLACE TISSUE EXPANDER LEFT BREAST AND TOTAL CAPSULECTOMY  08-05-2006  . REVISION BREAST RECONSTRUCTION Left 11-11-2004   12-03-2004  DRAINAGE SEREMA LEFT CHEST  . TOTAL HIP ARTHROPLASTY Left 07/17/2013   Procedure: LEFT TOTAL HIP ARTHROPLASTY ANTERIOR APPROACH;  Surgeon: Mauri Pole, MD;  Location: [facility];  Service: Orthopedics;  Laterality: Left;  . TOTAL HIP ARTHROPLASTY Right 01/28/2015   Procedure: RIGHT TOTAL HIP ARTHROPLASTY ANTERIOR APPROACH;  Surgeon: Paralee Cancel, MD;  Location: [facility];  Service: Orthopedics;  Laterality: Right;  . TOTAL KNEE ARTHROPLASTY Left 11/11/2015   Procedure: LEFT TOTAL KNEE ARTHROPLASTY;  Surgeon: Paralee Cancel, MD;  Location: [facility];  Service: Orthopedics;  Laterality: Left;  . TOTAL SHOULDER ARTHROPLASTY  08/11/2012   Procedure: TOTAL SHOULDER ARTHROPLASTY;  Surgeon: Augustin Schooling, MD;  Location: [facility];  Service: Orthopedics;  Laterality: Right;  RIGHT  TOTAL SHOULDER  ARTHROPLASTY   . TRANSTHORACIC ECHOCARDIOGRAM  02-25-2014   moderate LVH/  ef 55-60%/  mod. to sev. calcification AV with mild to moderate AV stenosis/  mild to mod. AR and MR/ mild PR/   moderate to severe LAE  . TUBAL LIGATION  1973    Family History  Problem  Relation Age of Onset  . Heart disease Mother   . Heart disease Father     Social History   Social History  . Marital status: Married    Spouse name: N/A  . Number of children: N/A  . Years of education: N/A   Occupational History  . Not on file.   Social History Main Topics  . Smoking status: Former Smoker    Packs/day: 0.25    Years: 8.00    Types: Cigarettes    Quit date: 10/18/1966  . Smokeless tobacco: Never Used  . Alcohol use No  . Drug use: No  . Sexual activity: Yes   Other Topics Concern  . Not on file   Social History Narrative  . No narrative on file    Prior to Admission medications   Medication Sig Start Date End Date Taking?  Authorizing Provider  acetaminophen (TYLENOL) 325 MG tablet Take 1-2 tablets (325-650 mg total) by mouth every 4 (four) hours as needed for mild pain or moderate pain. 01/29/15  Yes Matthew Babish, PA-C  amoxicillin (AMOXIL) 500 MG capsule TAKE 4 CAPSULES BY MOUTH 1 HOUR BEFORE DENTAL APPT FOR PREMED 09/03/15  Yes Historical Provider, MD  atorvastatin (LIPITOR) 40 MG tablet Take 20 mg by mouth daily.    Yes Historical Provider, MD  Calcium Carbonate-Vitamin D (CALCIUM-VITAMIN D) 500-200 MG-UNIT tablet Take 1 tablet by mouth daily.   Yes Historical Provider, MD  clobetasol ointment (TEMOVATE) [id number] % Apply 1 application topically 2 (two) times daily as needed (for skin irritation).   Yes Historical Provider, MD  diltiazem (TIAZAC) 360 MG 24 hr capsule Take 360 mg by mouth every evening.   Yes Historical Provider, MD  ferrous sulfate 325 (65 FE) MG tablet Take 1 tablet (325 mg total) by mouth 3 (three) times daily after meals. Patient taking differently: Take 325 mg by mouth daily.  11/12/15  Yes Matthew Babish, PA-C  furosemide (LASIX) 20 MG tablet TAKE 1 TABLET (20 MG TOTAL) BY MOUTH DAILY. 08/28/14  Yes Sueanne Margarita, MD  glucosamine-chondroitin 500-400 MG tablet Take 1 tablet by mouth 2 (two) times daily.   Yes Historical Provider, MD  losartan (COZAAR) 50 MG tablet Take 50 mg by mouth daily.   Yes Historical Provider, MD  Melatonin 10 MG TABS Take 1 tablet by mouth at bedtime.    Yes Historical Provider, MD  nitroGLYCERIN (NITROSTAT) 0.4 MG SL tablet Place 0.4 mg under the tongue every 5 (five) minutes as needed. For chest pain   Yes Historical Provider, MD  omeprazole (PRILOSEC) 20 MG capsule Take 20 mg by mouth every morning.    Yes Historical Provider, MD  polycarbophil (FIBER-CAPS) 625 MG tablet Take 625 mg by mouth daily.   Yes Historical Provider, MD  predniSONE (DELTASONE) 5 MG tablet Take 5 mg by mouth daily with breakfast.   Yes Historical Provider, MD  rivaroxaban (XARELTO) 20 MG TABS  tablet Take 20 mg by mouth daily with supper.   Yes Historical Provider, MD  traMADol (ULTRAM) 50 MG tablet Take 1-2 tablets (50-100 mg total) by mouth every 6 (six) hours as needed for moderate pain or severe pain. 11/12/15  Yes Danae Orleans, PA-C  triamcinolone cream (KENALOG) 0.1 % Apply 1 application topically as needed (for skin irritation).   Yes Historical Provider, MD    Current Facility-Administered Medications  Medication Dose Route Frequency Provider Last Rate Last Dose  . 0.9 %  sodium chloride infusion  250 mL Intravenous PRN  Troy Sine, MD      . 0.9 %  sodium chloride infusion   Intravenous Continuous Troy Sine, MD 75 mL/hr at 11/19/16 1528    . acetaminophen (TYLENOL) tablet 650 mg  650 mg Oral Q4H PRN Troy Sine, MD      . atorvastatin (LIPITOR) tablet 20 mg  20 mg Oral Daily Edwin Dada, MD   20 mg at 11/19/16 1050  . diazepam (VALIUM) tablet 5 mg  5 mg Oral Q6H PRN Troy Sine, MD      . furosemide (LASIX) tablet 20 mg  20 mg Oral Daily Edwin Dada, MD   20 mg at 11/19/16 1050  . gi cocktail (Maalox,Lidocaine,Donnatal)  30 mL Oral QID PRN Edwin Dada, MD   30 mL at 11/18/16 1517  . heparin ADULT infusion 100 units/mL (25000 units/286mL sodium chloride 0.45%)  900 Units/hr Intravenous Continuous Lavina Hamman, MD      . losartan (COZAAR) tablet 50 mg  50 mg Oral Daily Edwin Dada, MD   50 mg at 11/19/16 1050  . Melatonin TABS 9 mg  9 mg Oral QHS Gardiner Barefoot, NP   9 mg at 11/18/16 2227  . nitroGLYCERIN (NITROSTAT) SL tablet 0.4 mg  0.4 mg Sublingual Q5 min PRN Edwin Dada, MD      . ondansetron (ZOFRAN) injection 4 mg  4 mg Intravenous Q6H PRN Edwin Dada, MD      . pantoprazole (PROTONIX) EC tablet 40 mg  40 mg Oral Daily Edwin Dada, MD   40 mg at 11/19/16 1050  . predniSONE (DELTASONE) tablet 5 mg  5 mg Oral Q breakfast Edwin Dada, MD   5 mg at 11/19/16 1050  . sodium  chloride flush (NS) 0.9 % injection 3 mL  3 mL Intravenous Q12H Troy Sine, MD      . sodium chloride flush (NS) 0.9 % injection 3 mL  3 mL Intravenous PRN Troy Sine, MD        Allergies  Allergen Reactions  . Beta Adrenergic Blockers Shortness Of Breath  . Betapace [Sotalol Hcl] Shortness Of Breath  . Other     Narcotic pain medications - anxiety attacks  . Oxycodone Shortness Of Breath    Tolerates Dilaudid, Fentanyl  . Crestor [Rosuvastatin Calcium] Other (See Comments)    Cough, leg cramps  . Adhesive [Tape] Rash    Adhesive bandaids   . Clonidine Derivatives Palpitations    bradycardia  . Latex Rash    Per patient  . Tramadol Anxiety      Review of Systems:   General:  normal appetite, decreased energy, no weight gain, no weight loss, no fever  Cardiac:  no chest pain with exertion, + single episode chest pain at rest, + SOB with exertion, no resting SOB, no PND, no orthopnea, no palpitations, + arrhythmia, + atrial fibrillation, no LE edema, no dizzy spells, no syncope  Respiratory:  + exertional shortness of breath, no home oxygen, no productive cough, no dry cough, no bronchitis, no wheezing, no hemoptysis, no asthma, no pain with inspiration or cough, + sleep apnea, + CPAP at night  GI:   no difficulty swallowing, no reflux, no frequent heartburn, no hiatal hernia, no abdominal pain, no constipation, no diarrhea, no hematochezia, no hematemesis, no melena  GU:   no dysuria,  no frequency, no urinary tract infection, no hematuria, no kidney stones, no kidney disease  Vascular:  no pain suggestive of claudication, no pain in feet, no leg cramps, no varicose veins, no DVT, no non-healing foot ulcer  Neuro:   no stroke, no TIA's, no seizures, no headaches, no temporary blindness one eye,  no slurred speech, no peripheral neuropathy, no chronic pain, no instability of gait, no memory/cognitive dysfunction  Musculoskeletal: no arthritis, no joint swelling, no myalgias,  no difficulty walking, normal mobility   Skin:   no rash, no itching, no skin infections, no pressure sores or ulcerations  Psych:   no anxiety, no depression, no nervousness, no unusual recent stress  Eyes:   no blurry vision, no floaters, no recent vision changes, + wears glasses or contacts  ENT:   no hearing loss, no loose or painful teeth, no dentures, last saw dentist within the past year  Hematologic:  no easy bruising, no abnormal bleeding, no clotting disorder, no frequent epistaxis  Endocrine:  no diabetes, does not check CBG's at home     Physical Exam:   BP (!) 144/76   Pulse (!) 103   Temp 98 F (36.7 C) (Oral)   Resp 20   Wt 173 lb 1 oz (78.5 kg)   SpO2 100%   BMI 27.11 kg/m   General:  Moderately obese,  well-appearing  HEENT:  Unremarkable   Neck:   no JVD, no bruits, no adenopathy   Chest:   clear to auscultation, symmetrical breath sounds, no wheezes, no rhonchi   CV:   Irregular rate and rhythm, grade IV/VI harsh systolic murmur best LLSB  Abdomen:  soft, non-tender, no masses   Extremities:  warm, well-perfused, pulses diminished, no lower extremity edema  Rectal/GU  Deferred  Neuro:   Grossly non-focal and symmetrical throughout  Skin:   Clean and dry, no rashes, no breakdown  Diagnostic Tests:  Transthoracic Echocardiography  Patient:    [name], [name] MR #:       [id number] Study Date: 11/18/2016 Gender:     F Age:        67 Height:     170.2 cm Weight:     78.5 kg BSA:        1.94 m^2 Pt. Status: Room:       2W13C   SONOGRAPHER  Tresa Res, RDCS  Laverda Page, [place]  REFERRING    Berle Mull M  ATTENDING    Ward, Delice Bison  PERFORMING   [facility], Inpatient  ADMITTING    Myrene Buddy P  cc:  ------------------------------------------------------------------- LV EF: 60% -   65%  ------------------------------------------------------------------- Indications:      Chest pain  786.51.  ------------------------------------------------------------------- History:   PMH:   Congestive heart failure.  Aortic valve disease. Transient ischemic attack.  Risk factors:  Former tobacco use. Hypertension.  ------------------------------------------------------------------- Study Conclusions  - Left ventricle: The cavity size was normal. Wall thickness was   increased in a pattern of moderate LVH. Systolic function was   normal. The estimated ejection fraction was in the range of 60%   to 65%. Wall motion was normal; there were no regional wall   motion abnormalities. Doppler parameters are consistent with high   ventricular filling pressure. - Aortic valve: Valve mobility was restricted. There was moderate   to severe stenosis. There was moderate regurgitation. Valve area   (VTI): 0.75 cm^2. Valve area (Vmax): 0.77 cm^2. Valve area   (Vmean): 0.69 cm^2. - Mitral valve: Calcified annulus. Mildly thickened leaflets .   There  was mild regurgitation. Valve area by continuity equation   (using LVOT flow): 2.42 cm^2. - Left atrium: The atrium was severely dilated. - Right ventricle: The cavity size was mildly dilated. - Right atrium: The atrium was severely dilated. - Pulmonary arteries: Systolic pressure was moderately increased.   PA peak pressure: 50 mm Hg (S).  Impressions:  - Normal LV systolic function; moderate LVH; elevated LV filling   pressure; biatrial enlargement; mild RVE; calcified aortic valve   with moderate to severe AS and moderate AI; mild MR and TR;   moderately elevated pulmonary pressure.  ------------------------------------------------------------------- Study data:  Comparison was made to the study of 11/06/2015.  Study status:  Routine.  Study completion:  There were no complications.         Transthoracic echocardiography.  M-mode, complete 2D, spectral Doppler, and color Doppler.  Birthdate:  Patient birthdate: [birth year].  Age:   Patient is 81 yr old.  Sex:  Gender: female.    BMI: 27.1 kg/m^2.  Blood pressure:     153/46  Patient status:  Inpatient.  Study date:  Study date: 11/18/2016. Study time: 10:07 AM.  Location:  Bedside.  -------------------------------------------------------------------  ------------------------------------------------------------------- Left ventricle:  The cavity size was normal. Wall thickness was increased in a pattern of moderate LVH. Systolic function was normal. The estimated ejection fraction was in the range of 60% to 65%. Wall motion was normal; there were no regional wall motion abnormalities. The study was not technically sufficient to allow evaluation of LV diastolic dysfunction due to atrial fibrillation. Doppler parameters are consistent with high ventricular filling pressure.  ------------------------------------------------------------------- Aortic valve:   Trileaflet; moderately calcified leaflets. Valve mobility was restricted.  Doppler:   There was moderate to severe stenosis.   There was moderate regurgitation.    VTI ratio of LVOT to aortic valve: 0.26. Valve area (VTI): 0.75 cm^2. Indexed valve area (VTI): 0.39 cm^2/m^2. Peak velocity ratio of LVOT to aortic valve: 0.27. Valve area (Vmax): 0.77 cm^2. Indexed valve area (Vmax): 0.4 cm^2/m^2. Mean velocity ratio of LVOT to aortic valve: 0.24. Valve area (Vmean): 0.69 cm^2. Indexed valve area (Vmean): 0.36 cm^2/m^2.    Mean gradient (S): 35 mm Hg. Peak gradient (S): 57 mm Hg.  ------------------------------------------------------------------- Aorta:  Aortic root: The aortic root was normal in size.  ------------------------------------------------------------------- Mitral valve:   Calcified annulus. Mildly thickened leaflets . Mobility was not restricted.  Doppler:  Transvalvular velocity was within the normal range. There was no evidence for stenosis. There was mild regurgitation.    Valve area by  pressure half-time: 3.61 cm^2. Indexed valve area by pressure half-time: 1.86 cm^2/m^2. Valve area by continuity equation (using LVOT flow): 2.42 cm^2. Indexed valve area by continuity equation (using LVOT flow): 1.25 cm^2/m^2.    Mean gradient (D): 2 mm Hg. Peak gradient (D): 7 mm Hg.  ------------------------------------------------------------------- Left atrium:  The atrium was severely dilated.  ------------------------------------------------------------------- Right ventricle:  The cavity size was mildly dilated. Systolic function was normal.  ------------------------------------------------------------------- Pulmonic valve:    Doppler:  Transvalvular velocity was within the normal range. There was no evidence for stenosis. There was mild regurgitation.  ------------------------------------------------------------------- Tricuspid valve:   Structurally normal valve.    Doppler: Transvalvular velocity was within the normal range. There was mild regurgitation.  ------------------------------------------------------------------- Pulmonary artery:   Systolic pressure was moderately increased.  ------------------------------------------------------------------- Right atrium:  The atrium was severely dilated.  ------------------------------------------------------------------- Pericardium:  There was no pericardial effusion.  ------------------------------------------------------------------- Measurements   Left ventricle  Value          Reference  LV ID, ED, PLAX chordal                  43.4  mm       43 - 52  LV ID, ES, PLAX chordal                  29    mm       23 - 38  LV fx shortening, PLAX chordal           33    %        >=29  LV PW thickness, ED                      14.3  mm       ----------  IVS/LV PW ratio, ED                      1.01           <=1.3  Stroke volume, 2D                        74    ml       ----------  Stroke  volume/bsa, 2D                    38    ml/m^2   ----------  LV e&', lateral                           10.8  cm/s     ----------  LV E/e&', lateral                         12.04          ----------  LV e&', medial                            6.26  cm/s     ----------  LV E/e&', medial                          20.77          ----------  LV e&', average                           8.53  cm/s     ----------  LV E/e&', average                         15.24          ----------    Ventricular septum                       Value          Reference  IVS thickness, ED                        14.4  mm       ----------    LVOT  Value          Reference  LVOT ID, S                               19    mm       ----------  LVOT area                                2.84  cm^2     ----------  LVOT peak velocity, S                    102   cm/s     ----------  LVOT mean velocity, S                    67.3  cm/s     ----------  LVOT VTI, S                              26    cm       ----------    Aortic valve                             Value          Reference  Aortic valve peak velocity, S            378   cm/s     ----------  Aortic valve mean velocity, S            277   cm/s     ----------  Aortic valve VTI, S                      99.1  cm       ----------  Aortic mean gradient, S                  35    mm Hg    ----------  Aortic peak gradient, S                  57    mm Hg    ----------  VTI ratio, LVOT/AV                       0.26           ----------  Aortic valve area, VTI                   0.75  cm^2     ----------  Aortic valve area/bsa, VTI               0.39  cm^2/m^2 ----------  Velocity ratio, peak, LVOT/AV            0.27           ----------  Aortic valve area, peak velocity         0.77  cm^2     ----------  Aortic valve area/bsa, peak              0.4   cm^2/m^2 ----------  velocity  Velocity ratio, mean, LVOT/AV            0.24           ----------  Aortic valve area, mean velocity         0.69  cm^2     ----------  Aortic valve area/bsa, mean              0.36  cm^2/m^2 ----------  velocity  Aortic regurg pressure half-time         677   ms       ----------    Aorta                                    Value          Reference  Aortic root ID, ED                       28    mm       ----------    Left atrium                              Value          Reference  LA ID, A-P, ES                           54    mm       ----------  LA ID/bsa, A-P                   (H)     2.78  cm/m^2   <=2.2  LA volume, S                             180   ml       ----------  LA volume/bsa, S                         92.7  ml/m^2   ----------  LA volume, ES, 1-p A4C                   203   ml       ----------  LA volume/bsa, ES, 1-p A4C               104.5 ml/m^2   ----------  LA volume, ES, 1-p A2C                   152   ml       ----------  LA volume/bsa, ES, 1-p A2C               78.2  ml/m^2   ----------    Mitral valve                             Value          Reference  Mitral E-wave peak velocity              130   cm/s     ----------  Mitral mean velocity, D                  61.2  cm/s     ----------  Mitral deceleration time                 165  ms       150 - 230  Mitral pressure half-time                57    ms       ----------  Mitral mean gradient, D                  2     mm Hg    ----------  Mitral peak gradient, D                  7     mm Hg    ----------  Mitral valve area, PHT, DP               3.61  cm^2     ----------  Mitral valve area/bsa, PHT, DP           1.86  cm^2/m^2 ----------  Mitral valve area, LVOT                  2.42  cm^2     ----------  continuity  Mitral valve area/bsa, LVOT              1.25  cm^2/m^2 ----------  continuity  Mitral annulus VTI, D                    30.5  cm       ----------    Pulmonary arteries                       Value          Reference  PA pressure, S, DP               (H)     50     mm Hg    <=30    Tricuspid valve                          Value          Reference  Tricuspid regurg peak velocity           323   cm/s     ----------  Tricuspid peak RV-RA gradient            42    mm Hg    ----------    Right atrium                             Value          Reference  RA ID, S-I, ES, A4C              (H)     68.8  mm       34 - 49  RA area, ES, A4C                 (H)     27.6  cm^2     8.3 - 19.5  RA volume, ES, A/L                       99.2  ml       ----------  RA volume/bsa, ES, A/L                   51.1  ml/m^2   ----------    Systemic veins  Value          Reference  Estimated CVP                            8     mm Hg    ----------    Right ventricle                          Value          Reference  RV pressure, S, DP               (H)     50    mm Hg    <=30  RV s&', lateral, S                        10.4  cm/s     ----------  Legend: (L)  and  (H)  mark values outside specified reference range.  ------------------------------------------------------------------- Prepared and Electronically Authenticated by  Kirk Ruths 2018-02-01T12:45:08    STS Risk Calculator  Procedure    AVR + CABG  Risk of Mortality   3.6% Morbidity or Mortality  21.5% Prolonged LOS   12.8% Short LOS    20.5% Permanent Stroke   2.0% Prolonged Vent Support  17.1% DSW Infection    0.4% Renal Failure    6.4% Reoperation    7.7%    Impression:  Patient has stage D severe symptomatic aortic stenosis. She describes a long history of gradual progression of symptoms of exertional shortness of breath and fatigue consistent with chronic diastolic congestive heart failure, New York Heart Association functional class II. Symptoms are likely exacerbated by the presence of hypertension, significant diastolic dysfunction, and long standing persistent atrial fibrillation.  However, symptoms seem to be progressing over the past several months.  The  patient was admitted to the hospital acutely with an episode of epigastric chest discomfort that could be consistent with angina pectoris, although symptoms are somewhat atypical. I have personally reviewed the patient's transthoracic echocardiogram and diagnostic cardiac catheterization. Echocardiogram confirms the presence of a trileaflet aortic valve with severe thickening, calcification, and restricted leaflet mobility. Peak velocity across the aortic valve measured as high as 4.0 m/s corresponding to mean transvalvular gradient estimated 35 mmHg.  The DVI was relatively low at 0.24 and the patient has moderate aortic insufficiency with mild mitral regurgitation.  Assessment of transvalvular gradient is further impacted by the presence of atrial fibrillation. Left ventricular systolic function remains normal although there is significant left ventricular hypertrophy and diastolic dysfunction. Diagnostic cardiac catheterization is notable for mild nonobstructive coronary artery disease with exception of an eccentric 70% stenosis of a medium sized diagonal branch.  There does not appear to be any significant flow limiting proximal disease in in any of the 3 major coronary arteries.  I agree the patient would benefit from elective aortic valve replacement. Risks associated with conventional surgery would probably be moderately elevated because of the patient's age and associated comorbid medical conditions. Her functional status remains reasonably good and she probably would tolerate conventional surgery. Concomitant coronary artery bypass grafting to the diagonal branch and Maze procedure might be beneficial.  Alternatively, transcatheter aortic valve replacement could be considered as a somewhat less risky and far less invasive approach.    Plan:  The patient was counseled at length regarding treatment alternatives for management of severe symptomatic aortic stenosis, coronary  artery disease and atrial  fibrillation. Alternative approaches such as conventional aortic valve replacement with or without coronary artery bypass grafting and/or maze procedure, transcatheter aortic valve replacement, and long term medical therapy were compared and contrasted at length.  The risks associated with conventional surgical aortic valve replacement and transcatheter aortic valve replacement were been discussed in detail, as were expectations for post-operative convalescence following either approach. Long-term prognosis with medical therapy was discussed. This discussion was placed in the context of the patient's own specific clinical presentation and past medical history.  The patient is very interested in proceeding with further diagnostic testing to consider transcatheter aortic valve replacement as an alternative to conventional surgery.  As a next step she will need to undergo cardiac gated CT angiogram of the heart and CT angiogram of the aorta and iliac vessels to further characterize the anatomical feasibility and risks associated with TAVR.  The patient wants to go home as soon as possible. These diagnostic tests can be arranged as an outpatient. All of her questions have been addressed.    I spent in excess of 120 minutes during the conduct of this hospital consultation and >50% of this time involved direct face-to-face encounter for counseling and/or coordination of the patient's care.    Valentina Gu. Roxy Manns, MD 11/19/2016 5:59 PM

## 2016-11-19 NOTE — Progress Notes (Signed)
[place] for heparin  Indication: chest pain/ACS  Allergies  Allergen Reactions  . Beta Adrenergic Blockers Shortness Of Breath  . Betapace [Sotalol Hcl] Shortness Of Breath  . Other     Narcotic pain medications - anxiety attacks  . Oxycodone Shortness Of Breath    Tolerates Dilaudid, Fentanyl  . Crestor [Rosuvastatin Calcium] Other (See Comments)    Cough, leg cramps  . Adhesive [Tape] Rash    Adhesive bandaids   . Clonidine Derivatives Palpitations    bradycardia  . Latex Rash    Per patient  . Tramadol Anxiety    Patient Measurements: Weight: 173 lb 1 oz (78.5 kg) Ht: 5' 7''  Vital Signs: Temp: 98.3 F (36.8 C) (02/01 2048) Temp Source: Oral (02/01 2048) BP: 165/76 (02/01 2048) Pulse Rate: 69 (02/01 2334)  Labs:  Recent Labs  11/18/16 0445 11/18/16 1052 11/19/16 0305  HGB 13.4  --  13.0  HCT 40.9  --  39.1  PLT 214  --  202  APTT  --   --  67*  LABPROT  --  21.4*  --   INR  --  1.83  --   CREATININE 0.87  --   --   TROPONINI  --  0.06*  --     CrCl cannot be calculated (Unknown ideal weight.).  Assessment: 81 y.o. female with chest pain, h/o Afib and Xarelto on hold, for heparin  Goal of Therapy:  Heparin level 0.3-0.7 units/ml aPTT 66-102 seconds Monitor platelets by anticoagulation protocol: Yes   Plan:  Continue Heparin at current rate  Follow up after cath today   Phillis Knack, PharmD, BCPS  11/19/2016 4:58 AM

## 2016-11-19 NOTE — Progress Notes (Signed)
Site area:  Rt fem Art and Vein Site Prior to Removal:  Level 0 Pressure Applied For: 25 min Manual:   Yes Patient Status During Pull:  A/O Post Pull Site:  Level 0 Post Pull Instructions Given:  Instructions given and pt understands Post Pull Pulses Present:  2+ Rt Dp Bedrest begins @ 09:35:00 Comments: Pt leaves cath lab holding area in stable condition. Rt groin is unremarkable and dressing is CDI.

## 2016-11-19 NOTE — Progress Notes (Signed)
[place] for heparin  Indication: chest pain/ACS  Allergies  Allergen Reactions  . Beta Adrenergic Blockers Shortness Of Breath  . Betapace [Sotalol Hcl] Shortness Of Breath  . Other     Narcotic pain medications - anxiety attacks  . Oxycodone Shortness Of Breath    Tolerates Dilaudid, Fentanyl  . Crestor [Rosuvastatin Calcium] Other (See Comments)    Cough, leg cramps  . Adhesive [Tape] Rash    Adhesive bandaids   . Clonidine Derivatives Palpitations    bradycardia  . Latex Rash    Per patient  . Tramadol Anxiety    Patient Measurements: Weight: 173 lb 1 oz (78.5 kg) Ht: 5' 7''  Vital Signs: Temp: 98 F (36.7 C) (02/02 0616) Temp Source: Oral (02/02 0616) BP: 151/83 (02/02 0920) Pulse Rate: 74 (02/02 0920)  Labs:  Recent Labs  11/18/16 0445 11/18/16 1052 11/19/16 0305  HGB 13.4  --  13.0  HCT 40.9  --  39.1  PLT 214  --  202  APTT  --   --  67*  LABPROT  --  21.4*  --   INR  --  1.83  --   HEPARINUNFRC  --   --  1.36*  CREATININE 0.87  --   --   TROPONINI  --  0.06*  --     CrCl cannot be calculated (Unknown ideal weight.).  Assessment: 81 y.o. female with chest pain, h/o Afib and Xarelto on hold.  Pharmacy consulted to resume heparin drip 10 hrs after sheath removed s/p cardiac cath today.  Sheath out 0935 am. CBC stable, first aPTT was 67 on 900 units/hr.   Goal of Therapy:  Heparin level 0.3-0.7 units/ml aPTT 66-102 seconds Monitor platelets by anticoagulation protocol: Yes   Plan:  Resume heparin 10 hr s/p sheath removal at 1930 tonight heparin at 900 units/hr  -heparin level and aPTT at 0500 am 2/3  Eudelia Bunch, Pharm.D. BP:[id number] 11/19/2016 10:38 AM

## 2016-11-20 DIAGNOSIS — G4733 Obstructive sleep apnea (adult) (pediatric): Secondary | ICD-10-CM

## 2016-11-20 DIAGNOSIS — I481 Persistent atrial fibrillation: Secondary | ICD-10-CM

## 2016-11-20 LAB — CBC
HEMATOCRIT: 39.5 % (ref 36.0–46.0)
Hemoglobin: 13.4 g/dL (ref 12.0–15.0)
MCH: 31.8 pg (ref 26.0–34.0)
MCHC: 33.9 g/dL (ref 30.0–36.0)
MCV: 93.6 fL (ref 78.0–100.0)
PLATELETS: 192 10*3/uL (ref 150–400)
RBC: 4.22 MIL/uL (ref 3.87–5.11)
RDW: 13.5 % (ref 11.5–15.5)
WBC: 7.6 10*3/uL (ref 4.0–10.5)

## 2016-11-20 LAB — BASIC METABOLIC PANEL
Anion gap: 11 (ref 5–15)
BUN: 17 mg/dL (ref 6–20)
CALCIUM: 9.2 mg/dL (ref 8.9–10.3)
CO2: 23 mmol/L (ref 22–32)
CREATININE: 0.89 mg/dL (ref 0.44–1.00)
Chloride: 107 mmol/L (ref 101–111)
GFR calc Af Amer: >60 mL/min (ref >60)
GLUCOSE: 82 mg/dL (ref 65–99)
Potassium: 3.7 mmol/L (ref 3.5–5.1)
Sodium: 141 mmol/L (ref 135–145)

## 2016-11-20 LAB — APTT: aPTT: 74 seconds — ABNORMAL HIGH (ref 24–36)

## 2016-11-20 MED ORDER — RIVAROXABAN 20 MG PO TABS
20.0000 mg | ORAL_TABLET | Freq: Every day | ORAL | Status: DC
  Administered 2016-11-20: 20 mg via ORAL

## 2016-11-20 MED ORDER — RIVAROXABAN 20 MG PO TABS
20.0000 mg | ORAL_TABLET | Freq: Every day | ORAL | Status: DC
  Filled 2016-11-20: qty 1

## 2016-11-20 NOTE — Progress Notes (Signed)
[place] for xarelto (transition from heparin gtt)  Indication: afib   Allergies  Allergen Reactions  . Beta Adrenergic Blockers Shortness Of Breath  . Betapace [Sotalol Hcl] Shortness Of Breath  . Other     Narcotic pain medications - anxiety attacks  . Oxycodone Shortness Of Breath    Tolerates Dilaudid, Fentanyl  . Crestor [Rosuvastatin Calcium] Other (See Comments)    Cough, leg cramps  . Adhesive [Tape] Rash    Adhesive bandaids   . Clonidine Derivatives Palpitations    bradycardia  . Latex Rash    Per patient  . Tramadol Anxiety    Patient Measurements: Weight: 173 lb 1 oz (78.5 kg) Ht: 5' 7''  Vital Signs: Temp: 97.9 F (36.6 C) (02/03 0531) Temp Source: Oral (02/03 0531) BP: 178/87 (02/03 0531) Pulse Rate: 90 (02/03 0531)  Labs:  Recent Labs  11/18/16 0445 11/18/16 1052 11/19/16 0305 11/20/16 0241  HGB 13.4  --  13.0 13.4  HCT 40.9  --  39.1 39.5  PLT 214  --  202 192  APTT  --   --  67* 74*  LABPROT  --  21.4*  --   --   INR  --  1.83  --   --   HEPARINUNFRC  --   --  1.36*  --   CREATININE 0.87  --   --  0.89  TROPONINI  --  0.06*  --   --     CrCl cannot be calculated (Unknown ideal weight.).  Assessment: 81 y.o. female with chest pain, h/o Afib and Xarelto, which was on hold.  Pharmacy consulted to resume Xarelto. Spoke with RN and will schedule Xarelto to resume with lunch today. Heparin gtt to stop at time Xarelto dose is given (~ 1200), RN aware. CBC stable, no s/s bleeding noted.   Goal of Therapy:  Monitor platelets by anticoagulation protocol: Yes   Plan:  D/C heparin gtt at 1200 on 2/3  Resume home Xarelto at 20mg  daily at 1200 on 2/3  F/u re-education Monitor for s/s bleeding   Argie Ramming, PharmD Pharmacy Resident  Pager [phone] 11/20/16 8:23 AM

## 2016-11-21 NOTE — Discharge Summary (Addendum)
[facility] Discharge Summary   Patient: [name] [id number]   PCP: Jonathon Bellows, MD DOB: 1936/09/20   Date of admission: 11/18/2016   Date of discharge: 11/20/2016   Discharge Diagnoses:  Principal Problem:   Severe aortic stenosis Active Problems:   HTN (hypertension)   Atrial fibrillation, long-standing persistent (HCC)   OSA (obstructive sleep apnea)   Chronic diastolic congestive heart failure (HCC)   Chest pain   Admitted From: home Disposition:  home  Recommendations for Outpatient Follow-up:  1. Follow-up with PCP in 1 week, cardiology and cardiac thoracic surgery in 2 weeks.   Follow-up Information    WEBB, CAROL D, MD. Schedule an appointment as soon as possible for a visit in 1 week(s).   Specialty:  Family Medicine Contact information: [place] [address] [phone]        Rexene Alberts, MD. Schedule an appointment as soon as possible for a visit in 2 week(s).   Specialty:  Cardiothoracic Surgery Contact information: [address] [place] [address] [phone]        Fransico Him, MD. Schedule an appointment as soon as possible for a visit in 2 week(s).   Specialty:  Cardiology Contact information: [id number] [place]  [address] [phone]          Diet recommendation: Cardiac diet  Activity: The patient is advised to gradually reintroduce usual activities.  Discharge Condition: good  Code Status: Full code  History of present illness: As per the H and P dictated on admission, " [name] is a 81 y.o. female with a past medical history significant for chronic Afib on Xarelto, mild to mod AS, HFpEF, and HTN who presents with chest pain.  The patient was in her usual health (doing a lot of extra work around the house) until yesterday around [facility] when she had insidious onset of central chest pain, moderate in intensity, pressure or dull in  character, not worse with exertion (walking up stairs) nor eating nor position.  This progressed through the evening, then around 10pm she took two nitro that relieved the pain, but it returned in about 1/2 hr, and so she came to the ER."  Hospital Course:   Summary of her active problems in the hospital is as following. 1. Chest pain  Severe aortic stenosis. Patient's symptoms likely associated with aortic stenosis. Patient underwent right and left cardiac catheterization, no significance blockages Cardiology recommending TAVR, after discussing a thoracic surgery patient chose to go for the procedure but requesting to be discharged and getting the preprocedure workup as an outpatient. Currently thoracic surgery as well as cardiology agrees with the plan. She will follow up with thoracic surgery is recommended.  2. Hypertension. Currently on home regimen.  3. Non-Obstructive coronary artery disease. S/P cardiac catheterization. Currently on Xarelto.  Continue statin.  4. A. fib. On Xarelto, was on hold due to requiring cardiac catheterization.  All other chronic medical condition were stable during the hospitalization.  Patient was ambulatory without any assistance. On the day of the discharge the patient's vitals were stable , and no other acute medical condition were reported by patient. the patient was felt safe to be discharge at home with family.  Procedures and Results:  Cardiac catheterization  Echocardiogram   Consultations:  Cardiology  Cardiac thoracic surgery  DISCHARGE MEDICATION: Discharge Medication List as of 11/20/2016 10:03 AM    CONTINUE these medications which  have NOT CHANGED   Details  acetaminophen (TYLENOL) 325 MG tablet Take 1-2 tablets (325-650 mg total) by mouth every 4 (four) hours as needed for mild pain or moderate pain., Starting Wed 01/29/2015, No Print    amoxicillin (AMOXIL) 500 MG capsule TAKE 4 CAPSULES BY MOUTH 1 HOUR BEFORE DENTAL  APPT FOR PREMED, Historical Med    atorvastatin (LIPITOR) 40 MG tablet Take 20 mg by mouth daily. , Historical Med    Calcium Carbonate-Vitamin D (CALCIUM-VITAMIN D) 500-200 MG-UNIT tablet Take 1 tablet by mouth daily., Historical Med    clobetasol ointment (TEMOVATE) [id number] % Apply 1 application topically 2 (two) times daily as needed (for skin irritation)., Historical Med    diltiazem (TIAZAC) 360 MG 24 hr capsule Take 360 mg by mouth every evening., Historical Med    ferrous sulfate 325 (65 FE) MG tablet Take 1 tablet (325 mg total) by mouth 3 (three) times daily after meals., Starting Wed 11/12/2015, No Print    furosemide (LASIX) 20 MG tablet TAKE 1 TABLET (20 MG TOTAL) BY MOUTH DAILY., Normal    glucosamine-chondroitin 500-400 MG tablet Take 1 tablet by mouth 2 (two) times daily., Historical Med    losartan (COZAAR) 50 MG tablet Take 50 mg by mouth daily., Historical Med    Melatonin 10 MG TABS Take 1 tablet by mouth at bedtime. , Historical Med    nitroGLYCERIN (NITROSTAT) 0.4 MG SL tablet Place 0.4 mg under the tongue every 5 (five) minutes as needed. For chest pain, Historical Med    omeprazole (PRILOSEC) 20 MG capsule Take 20 mg by mouth every morning. , Historical Med    polycarbophil (FIBER-CAPS) 625 MG tablet Take 625 mg by mouth daily., Historical Med    predniSONE (DELTASONE) 5 MG tablet Take 5 mg by mouth daily with breakfast., Historical Med    rivaroxaban (XARELTO) 20 MG TABS tablet Take 20 mg by mouth daily with supper., Historical Med    traMADol (ULTRAM) 50 MG tablet Take 1-2 tablets (50-100 mg total) by mouth every 6 (six) hours as needed for moderate pain or severe pain., Starting Wed 11/12/2015, Print    triamcinolone cream (KENALOG) 0.1 % Apply 1 application topically as needed (for skin irritation)., Historical Med       Allergies  Allergen Reactions  . Beta Adrenergic Blockers Shortness Of Breath  . Betapace [Sotalol Hcl] Shortness Of Breath  . Other      Narcotic pain medications - anxiety attacks  . Oxycodone Shortness Of Breath    Tolerates Dilaudid, Fentanyl  . Crestor [Rosuvastatin Calcium] Other (See Comments)    Cough, leg cramps  . Adhesive [Tape] Rash    Adhesive bandaids   . Clonidine Derivatives Palpitations    bradycardia  . Latex Rash    Per patient  . Tramadol Anxiety   Discharge Instructions    Diet - low sodium heart healthy    Complete by:  As directed    Discharge instructions    Complete by:  As directed    It is important that you read following instructions as well as go over your medication list with RN to help you understand your care after this hospitalization.  Discharge Instructions: Please follow-up with PCP in one week  Please request your primary care physician to go over all Hospital Tests and Procedure/Radiological results at the follow up,  Please get all Hospital records sent to your PCP by signing hospital release before you go home.   Do not take  more than prescribed Pain, Sleep and Anxiety Medications. You were cared for by a hospitalist during your hospital stay. If you have any questions about your discharge medications or the care you received while you were in the hospital after you are discharged, you can call the unit and ask to speak with the hospitalist on call if the hospitalist that took care of you is not available.  Once you are discharged, your primary care physician will handle any further medical issues. Please note that NO REFILLS for any discharge medications will be authorized once you are discharged, as it is imperative that you return to your primary care physician (or establish a relationship with a primary care physician if you do not have one) for your aftercare needs so that they can reassess your need for medications and monitor your lab values. You Must read complete instructions/literature along with all the possible adverse reactions/side effects for all the Medicines you  take and that have been prescribed to you. Take any new Medicines after you have completely understood and accept all the possible adverse reactions/side effects. Wear Seat belts while driving. If you have smoked or chewed Tobacco in the last 2 yrs please stop smoking and/or stop any Recreational drug use.   Increase activity slowly    Complete by:  As directed      Discharge Exam: Filed Weights   11/18/16 0900  Weight: 78.5 kg (173 lb 1 oz)   Vitals:   11/19/16 2020 11/20/16 0531  BP: (!) 167/86 (!) 178/87  Pulse: 89 90  Resp: 18 18  Temp: 98.2 F (36.8 C) 97.9 F (36.6 C)   General: Appear in no distress, no Rash; Oral Mucosa moist. Cardiovascular: S1 and S2 Present, no Murmur, no JVD Respiratory: Bilateral Air entry present and Clear to Auscultation, no Crackles, no wheezes Abdomen: Bowel Sound present, Soft and no tenderness Extremities: no Pedal edema, no calf tenderness Neurology: Grossly no focal neuro deficit.  The results of significant diagnostics from this hospitalization (including imaging, microbiology, ancillary and laboratory) are listed below for reference.    Significant Diagnostic Studies: Dg Chest 2 View  Result Date: 11/18/2016 CLINICAL DATA:  Mid chest pain tonight. EXAM: CHEST  2 VIEW COMPARISON:  08/16/2012 FINDINGS: Stable cardiomegaly. There is atherosclerosis of the thoracic aorta. No pulmonary edema, focal airspace opacity or pleural fluid. No pneumothorax. Surgical clips in the left breast/ axilla. Right humeral prosthesis. No acute osseous abnormality. IMPRESSION: No acute abnormality.  Stable cardiomegaly. Aortic atherosclerosis. Electronically Signed   By: Jeb Levering M.D.   On: 11/18/2016 03:23    Microbiology: No results found for this or any previous visit (from the past 240 hour(s)).   Labs: CBC:  Recent Labs Lab 11/18/16 0445 11/19/16 0305 11/20/16 0241  WBC 10.3 8.4 7.6  HGB 13.4 13.0 13.4  HCT 40.9 39.1 39.5  MCV 95.1 94.2  93.6  PLT 214 202 [id number]   Basic Metabolic Panel:  Recent Labs Lab 11/18/16 0445 11/20/16 0241  NA 140 141  K 4.2 3.7  CL 107 107  CO2 26 23  GLUCOSE 100* 82  BUN 17 17  CREATININE 0.87 0.89  CALCIUM 9.5 9.2   Cardiac Enzymes:  Recent Labs Lab 11/18/16 1052  TROPONINI 0.06*   BNP (last 3 results)  Recent Labs  11/18/16 0653  BNP 352.5*   CBG: No results for input(s): GLUCAP in the last 168 hours. Time spent: 30 minutes  Signed:  Berle Mull  [facility] 11/20/2016 ,  3:44 PM

## 2016-11-22 ENCOUNTER — Other Ambulatory Visit: Payer: Self-pay | Admitting: *Deleted

## 2016-11-22 DIAGNOSIS — Z09 Encounter for follow-up examination after completed treatment for conditions other than malignant neoplasm: Secondary | ICD-10-CM | POA: Diagnosis not present

## 2016-11-22 DIAGNOSIS — I350 Nonrheumatic aortic (valve) stenosis: Secondary | ICD-10-CM

## 2016-11-22 DIAGNOSIS — I2510 Atherosclerotic heart disease of native coronary artery without angina pectoris: Secondary | ICD-10-CM | POA: Diagnosis not present

## 2016-11-29 ENCOUNTER — Ambulatory Visit (HOSPITAL_COMMUNITY)
Admission: RE | Admit: 2016-11-29 | Discharge: 2016-11-29 | Disposition: A | Discharge status: Home / Self Care | Payer: Medicare Other | Source: Ambulatory Visit | Attending: Thoracic Surgery (Cardiothoracic Vascular Surgery) | Admitting: Thoracic Surgery (Cardiothoracic Vascular Surgery)

## 2016-11-29 ENCOUNTER — Encounter: Payer: Self-pay | Admitting: Physical Therapy

## 2016-11-29 ENCOUNTER — Ambulatory Visit (HOSPITAL_COMMUNITY): Payer: Medicare Other

## 2016-11-29 ENCOUNTER — Ambulatory Visit: Payer: Medicare Other | Attending: Thoracic Surgery (Cardiothoracic Vascular Surgery) | Admitting: Physical Therapy

## 2016-11-29 ENCOUNTER — Encounter (HOSPITAL_COMMUNITY): Payer: Self-pay

## 2016-11-29 ENCOUNTER — Ambulatory Visit (HOSPITAL_BASED_OUTPATIENT_CLINIC_OR_DEPARTMENT_OTHER)
Admission: RE | Admit: 2016-11-29 | Discharge: 2016-11-29 | Disposition: A | Discharge status: Home / Self Care | Payer: Medicare Other | Source: Ambulatory Visit | Attending: Thoracic Surgery (Cardiothoracic Vascular Surgery) | Admitting: Thoracic Surgery (Cardiothoracic Vascular Surgery)

## 2016-11-29 DIAGNOSIS — K8020 Calculus of gallbladder without cholecystitis without obstruction: Secondary | ICD-10-CM | POA: Diagnosis not present

## 2016-11-29 DIAGNOSIS — R293 Abnormal posture: Secondary | ICD-10-CM | POA: Diagnosis not present

## 2016-11-29 DIAGNOSIS — R2689 Other abnormalities of gait and mobility: Secondary | ICD-10-CM | POA: Diagnosis not present

## 2016-11-29 DIAGNOSIS — I350 Nonrheumatic aortic (valve) stenosis: Secondary | ICD-10-CM

## 2016-11-29 LAB — PULMONARY FUNCTION TEST
DL/VA % PRED: 56 %
DL/VA: 2.91 ml/min/mmHg/L
DLCO UNC: 12.68 ml/min/mmHg
DLCO unc % pred: 44 %
FEF 25-75 POST: 2.74 L/s
FEF 25-75 Pre: 2.25 L/sec
FEF2575-%Change-Post: 21 %
FEF2575-%Pred-Post: 167 %
FEF2575-%Pred-Pre: 137 %
FEV1-%CHANGE-POST: 4 %
FEV1-%PRED-PRE: 109 %
FEV1-%Pred-Post: 113 %
FEV1-POST: 2.58 L
FEV1-Pre: 2.48 L
FEV1FVC-%Change-Post: 7 %
FEV1FVC-%PRED-PRE: 105 %
FEV6-%CHANGE-POST: -1 %
FEV6-%PRED-POST: 106 %
FEV6-%Pred-Pre: 108 %
FEV6-POST: 3.08 L
FEV6-Pre: 3.11 L
FEV6FVC-%CHANGE-POST: 2 %
FEV6FVC-%PRED-POST: 105 %
FEV6FVC-%Pred-Pre: 103 %
FVC-%Change-Post: -3 %
FVC-%Pred-Post: 101 %
FVC-%Pred-Pre: 104 %
FVC-Post: 3.08 L
FVC-Pre: 3.18 L
PRE FEV1/FVC RATIO: 78 %
PRE FEV6/FVC RATIO: 98 %
Post FEV1/FVC ratio: 84 %
Post FEV6/FVC ratio: 100 %
RV % pred: 77 %
RV: 1.95 L
TLC % PRED: 92 %
TLC: 5.08 L

## 2016-11-29 LAB — VAS US CAROTID
LCCADDIAS: 7 cm/s
LCCADSYS: 62 cm/s
LCCAPDIAS: 9 cm/s
LCCAPSYS: 68 cm/s
LEFT ECA DIAS: -1 cm/s
LEFT VERTEBRAL DIAS: 9 cm/s
LICADSYS: -79 cm/s
Left ICA dist dias: -18 cm/s
Left ICA prox dias: -13 cm/s
Left ICA prox sys: -68 cm/s
RCCADSYS: 50 cm/s
RCCAPSYS: 59 cm/s
RIGHT ECA DIAS: -4 cm/s
RIGHT VERTEBRAL DIAS: -5 cm/s
Right CCA prox dias: 9 cm/s

## 2016-11-29 MED ORDER — METOPROLOL TARTRATE 5 MG/5ML IV SOLN
INTRAVENOUS | Status: AC
  Filled 2016-11-29: qty 10

## 2016-11-29 MED ORDER — METOPROLOL TARTRATE 5 MG/5ML IV SOLN
5.0000 mg | INTRAVENOUS | Status: DC | PRN
  Administered 2016-11-29: 5 mg via INTRAVENOUS

## 2016-11-29 MED ORDER — METOPROLOL TARTRATE 5 MG/5ML IV SOLN
INTRAVENOUS | Status: AC
  Administered 2016-11-29: 5 mg
  Filled 2016-11-29: qty 10

## 2016-11-29 MED ORDER — ALBUTEROL SULFATE (2.5 MG/3ML) 0.083% IN NEBU
2.5000 mg | INHALATION_SOLUTION | Freq: Once | RESPIRATORY_TRACT | Status: AC
  Administered 2016-11-29: 2.5 mg via RESPIRATORY_TRACT

## 2016-11-29 MED ORDER — IOPAMIDOL (ISOVUE-370) INJECTION 76%
INTRAVENOUS | Status: AC
  Administered 2016-11-29: 50 mL
  Filled 2016-11-29: qty 50

## 2016-11-29 MED ORDER — IOPAMIDOL (ISOVUE-370) INJECTION 76%
INTRAVENOUS | Status: AC
  Administered 2016-11-29: 100 mL
  Filled 2016-11-29: qty 100

## 2016-11-29 NOTE — Progress Notes (Signed)
VASCULAR LAB PRELIMINARY  PRELIMINARY  PRELIMINARY  PRELIMINARY  Carotid duplex completed.    Preliminary report:  1-39% ICA plaquing. Vertebral artery flow is antegrade.   Kaedance Magos, RVT 11/29/2016, 9:19 AM

## 2016-11-29 NOTE — Progress Notes (Signed)
Had a total of 10mg  of Metoprolol. Dr Meda Coffee and pharmacist Joelene Millin felt this would be OK despite allergy of SOB. VS remained stable and denies and no evidence of SOB. Having a snack in the nurses station.

## 2016-11-29 NOTE — Therapy (Signed)
[place], [place], [address] Phone: [phone]   Fax:  [phone]  Physical Therapy Evaluation  Patient Details  Name: [name] MRN: [id number] Date of Birth: 27-Apr-1936 Referring Provider: Dr. Darylene Price  Encounter Date: 11/29/2016      PT End of Session - 11/29/16 1430    Visit Number 1   PT Start Time 1350   PT Stop Time 1430   PT Time Calculation (min) 40 min      Past Medical History:  Diagnosis Date  . Anginal pain ([place])   . Aortic stenosis   . Arthritis   . Atrial fibrillation, long-standing persistent ([place])   . Cancer [facility])    left breast mastectomy(cancer)-surgery only  . Chest pain 11/18/2016  . Chronic anticoagulation   . Chronic atrial fibrillation (HCC) CARDIOLOGIST -- DR Tressia Miners TURNER   controlled  . Chronic diastolic congestive heart failure ([place])   . Complication of anesthesia    extremely claustophobic(only occurs with narcotic medications as stated per pt)  . Diverticulosis   . Dysrhythmia   . Fibromyalgia   . GERD (gastroesophageal reflux disease)   . Hard of hearing    wears hearing aides   . Heart murmur    mild to mod AS, mild to mod. AR and MR, mild  TR per echo 02-25-2014  . History of breast cancer no recurrence   dx 2004   s/p  left breast mastectomy w/ node dissection/   no chemo or radiation  . History of nuclear stress test    Myoview 1/17: EF 54%, apical thinning, no ischemia, low risk  . History of TIA (transient ischemic attack)    per scan--  no residual  . Hyperlipidemia   . Hypertension   . Nocturia   . OSA on CPAP     severe/  AHI 34/hr  . Short of breath on exertion    01-16-15 not a problem now.  . Urge urinary incontinence   . Wears glasses     Past Surgical History:  Procedure Laterality Date  . ABDOMINOPLASTY  1987  . APPENDECTOMY  1965  . BREAST SURGERY    . CARDIAC CATHETERIZATION N/A 11/19/2016   Procedure: Right/Left  Heart Cath and Coronary Angiography;  Surgeon: Troy Sine, MD;  Location: [facility];  Service: Cardiovascular;  Laterality: N/A;  . CARPAL TUNNEL RELEASE Right 07-08-2004  . COLONOSCOPY WITH ESOPHAGOGASTRODUODENOSCOPY (EGD)  2006  . EXPLANTATION BREAST IMPLANT AND DEBRIDEMENT  09-15-2005  . INTERSTIM IMPLANT PLACEMENT N/A 08/22/2014   Procedure: Barrie Lyme IMPLANT STAGE ONE/TWO;  Surgeon: Reece Packer, MD;  Location: [facility];  Service: Urology;  Laterality: N/A;  . MASTECTOMY    . MASTECTOMY, MODIFIED RADICAL W/RECONSTRUCTION  09-16-2003   left breast W/ SLN DISSECTION (pt states had 13 operations for implant infection   . REPLACE TISSUE EXPANDER LEFT BREAST AND TOTAL CAPSULECTOMY  08-05-2006  . REVISION BREAST RECONSTRUCTION Left 11-11-2004   12-03-2004  DRAINAGE SEREMA LEFT CHEST  . TOTAL HIP ARTHROPLASTY Left 07/17/2013   Procedure: LEFT TOTAL HIP ARTHROPLASTY ANTERIOR APPROACH;  Surgeon: Mauri Pole, MD;  [facility];  Service: Orthopedics;  Laterality: Left;  . TOTAL HIP ARTHROPLASTY Right 01/28/2015   Procedure: RIGHT TOTAL HIP ARTHROPLASTY ANTERIOR APPROACH;  Surgeon: Paralee Cancel, MD;  [facility];  Service: Orthopedics;  Laterality: Right;  . TOTAL KNEE ARTHROPLASTY Left 11/11/2015   Procedure: LEFT TOTAL KNEE ARTHROPLASTY;  Surgeon: Paralee Cancel, MD;  [facility];  Service: Orthopedics;  Laterality: Left;  . TOTAL SHOULDER ARTHROPLASTY  08/11/2012   Procedure: TOTAL SHOULDER ARTHROPLASTY;  Surgeon: Augustin Schooling, MD;  Location: [facility];  Service: Orthopedics;  Laterality: Right;  RIGHT  TOTAL SHOULDER  ARTHROPLASTY   . TRANSTHORACIC ECHOCARDIOGRAM  02-25-2014   moderate LVH/  ef 55-60%/  mod. to sev. calcification AV with mild to moderate AV stenosis/  mild to mod. AR and MR/ mild PR/   moderate to severe LAE  . TUBAL LIGATION  1973    There were no vitals filed for this visit.       Subjective Assessment - 11/29/16 1353     Subjective over last couple of months pt has noticed gradual progression of shortness of breath with more strenous activities such as stairs, walking fast and a lot of housework all of which are alleviated with rest   Patient Stated Goals be able to do housework without rests   Pain Score 0-No pain            [facility] PT Assessment - 11/29/16 0001      Assessment   Medical Diagnosis severe aortic stenosis   Referring Provider Dr. Darylene Price   Onset Date/Surgical Date 08/29/16  approximate     Precautions   Precautions None   Precaution Comments No BP LUE     Restrictions   Weight Bearing Restrictions No     Balance Screen   Has the patient fallen in the past 6 months No   Has the patient had a decrease in activity level because of a fear of falling?  No   Is the patient reluctant to leave their home because of a fear of falling?  No     Home Ecologist residence   Living Arrangements Spouse/significant other   [place] to enter   Entrance Stairs-Number of Steps 3   Entrance Stairs-Rails Right;Left;Can reach both   [place] Two level     Prior Function   Level of [place] with community mobility without device     Posture/Postural Control   Posture/Postural Control Postural limitations   Postural Limitations Forward head;Rounded Shoulders  scoliotic curve   Posture Comments R shoulder elevated     ROM / Strength   AROM / PROM / Strength AROM;Strength     AROM   Overall AROM Comments Grossly WNL throughout     Strength   Overall Strength Comments grossly 5/5   Strength Assessment Site Hand   Right/Left hand Right;Left   Right Hand Grip (lbs) 38  R hand dominant   Left Hand Grip (lbs) 25     Ambulation/Gait   Gait Comments No significant gait deviations. Pt did need to slow her speed in the 6 minute walk test due to progressive exertional shortness of breath and her BP increased significantly with  this test.           [facility] Pre-Surgical Assessment - 11/29/16 0001    5 Meter Walk Test- trial 1 4 sec   5 Meter Walk Test- trial 2 4 sec.    5 Meter Walk Test- trial 3 4 sec.   5 meter walk test average 4 sec   4 Stage Balance Test tolerated for:  6 sec.   4 Stage Balance Test Position 4   Sit To Stand Test- trial 1 11 sec.   ADL/IADL Independent with: Bathing;Dressing;Finances;Meal prep  ADL/IADL Needs Assistance with: Valla Leaver work   ADL/IADL Fraility Index Vulnerable   6 Minute Walk- Baseline yes   BP (mmHg) 144/74   HR (bpm) 75   02 Sat (%RA) 99 %   Modified Borg Scale for Dyspnea 0- Nothing at all   Perceived Rate of Exertion (Borg) 6-   6 Minute Walk Post Test yes   BP (mmHg) (P)  173/80  153/74 after 5 minutes   HR (bpm) 96   02 Sat (%RA) 99 %   Modified Borg Scale for Dyspnea 3- Moderate shortness of breath or breathing difficulty   Perceived Rate of Exertion (Borg) 11- Fairly light   Aerobic Endurance Distance Walked 1400                                     Plan - 12-13-16 1442    Clinical Impression Statement see below   PT Frequency One time visit   Consulted and Agree with Plan of Care Patient     Clinical Impression Statement: Pt is a 81 yo female presenting to OP PT for evaluation prior to possible TAVR surgery due to severe aortic stenosis. Pt reports onset of progressive exertional shortness of breath approximately 3 months ago. Symptoms are limiting her ability to perform housework without rest as well as slowing her gait down and ability to climb stairs. Pt presents with good ROM and strength, good balance and is not at high fall risk 4 stage balance test, good walking speed and fair aerobic endurance per 6 minute walk test. Pt ambulated a total of 1400 feet in 6 minute walk. BP increased significantly with 6 minute walk test. Based on the Short Physical Performance Battery, patient has a frailty rating of 12/12 with </= 5/12  considered frail.   Patient demontrated the following deficits and impairments:     Visit Diagnosis: Other abnormalities of gait and mobility  Abnormal posture      G-Codes - Dec 13, 2016 1443    Functional Assessment Tool Used 6 minute walk 1400'   Functional Limitation Mobility: Walking and moving around   Mobility: Walking and Moving Around Current Status [phone]) At least 1 percent but less than 20 percent impaired, limited or restricted   Mobility: Walking and Moving Around Goal Status [phone]) At least 1 percent but less than 20 percent impaired, limited or restricted   Mobility: Walking and Moving Around Discharge Status [phone]) At least 1 percent but less than 20 percent impaired, limited or restricted       Problem List Patient Active Problem List   Diagnosis Date Noted  . Chest pain 11/18/2016  . S/P left TKA 11/11/2015  . Overweight (BMI 25.0-29.9) 01/29/2015  . S/P right THA, AA 01/28/2015  . Chronic diastolic congestive heart failure ([place])   . OSA (obstructive sleep apnea) 02/25/2014  . Atrial fibrillation, long-standing persistent ([place])   . Expected blood loss anemia 07/18/2013  . Obese 07/18/2013  . Hypercholesteremia   . Infected postoperative breast seroma   . Breast implant removal status   . S/P carpal tunnel release   . Cancer ([place])   . Shortness of breath   . GERD (gastroesophageal reflux disease)   . Complication of anesthesia   . Hypercholesterolemia   . Menopause   . Breast cancer ([place])   . Lower back pain   . TIA (transient ischemic attack)   . Heart murmur   .  Severe aortic stenosis   . Fibromyalgia   . Arthritis   . Osteoarthrosis, unspecified whether generalized or localized, shoulder region 08/11/2012  . hx: breast cancer, left, invasive lobular, multifocal, receptor + her 2 - 11/15/2011  . HTN (hypertension) 11/13/2011  . Retroperitoneal hematoma 03/19/2011    NICOLETTA,DANA, PT 11/29/2016, 2:44 PM  [facility] [address] [place], [place], [address] Phone: [phone]   Fax:  [phone]  Name: ADRIANAH VIERTEL MRN: [id number] Date of Birth: 06/07/1936

## 2016-11-29 NOTE — Progress Notes (Signed)
Pt D/C home- directions given to next apptm on [address]. No questions/concerns. VS stable,

## 2016-12-01 ENCOUNTER — Institutional Professional Consult (permissible substitution) (INDEPENDENT_AMBULATORY_CARE_PROVIDER_SITE_OTHER): Payer: Medicare Other | Admitting: Surgery

## 2016-12-01 ENCOUNTER — Encounter: Payer: Self-pay | Admitting: Surgery

## 2016-12-01 ENCOUNTER — Other Ambulatory Visit: Payer: Self-pay | Admitting: *Deleted

## 2016-12-01 VITALS — BP 126/69 | HR 64 | Resp 16 | Ht 67.0 in | Wt 173.0 lb

## 2016-12-01 DIAGNOSIS — I350 Nonrheumatic aortic (valve) stenosis: Secondary | ICD-10-CM

## 2016-12-02 ENCOUNTER — Ambulatory Visit (INDEPENDENT_AMBULATORY_CARE_PROVIDER_SITE_OTHER): Payer: Medicare Other | Admitting: Cardiology

## 2016-12-02 VITALS — BP 146/60 | HR 70 | Ht 67.0 in | Wt 185.4 lb

## 2016-12-02 DIAGNOSIS — I350 Nonrheumatic aortic (valve) stenosis: Secondary | ICD-10-CM | POA: Diagnosis not present

## 2016-12-02 DIAGNOSIS — E7800 Pure hypercholesterolemia, unspecified: Secondary | ICD-10-CM | POA: Diagnosis not present

## 2016-12-02 DIAGNOSIS — I10 Essential (primary) hypertension: Secondary | ICD-10-CM

## 2016-12-02 DIAGNOSIS — I5032 Chronic diastolic (congestive) heart failure: Secondary | ICD-10-CM

## 2016-12-02 DIAGNOSIS — I482 Chronic atrial fibrillation: Secondary | ICD-10-CM | POA: Diagnosis not present

## 2016-12-02 DIAGNOSIS — I272 Pulmonary hypertension, unspecified: Secondary | ICD-10-CM

## 2016-12-02 DIAGNOSIS — I4821 Permanent atrial fibrillation: Secondary | ICD-10-CM

## 2016-12-02 NOTE — Patient Instructions (Signed)
Medication Instructions:  Your physician recommends that you continue on your current medications as directed. Please refer to the Current Medication list given to you today.   Labwork: None  Testing/Procedures: None  Follow-Up: Your physician recommends that you schedule a follow-up appointment AS NEEDED with Dr. Turner.  Any Other Special Instructions Will Be Listed Below (If Applicable).     If you need a refill on your cardiac medications before your next appointment, please call your pharmacy.   

## 2016-12-02 NOTE — Progress Notes (Signed)
Cardiology Office Note    Date:  12/15/2016   ID:  DANIEL DESJARDIN, DOB 02/15/1936, MRN [id number]  PCP:  Jonathon Bellows, MD  Cardiologist:  Fransico Him, MD   Chief Complaint  Patient presents with  . Coronary Artery Disease  . Hypertension  . Aortic Stenosis  . Atrial Fibrillation  . Congestive Heart Failure    History of Present Illness:  [name] is a 81 y.o. female with a PMH significant for chronic Afib (on xarelto), HTN, chronic diastolic heart failure, aortic stenosis, HLD, obesity, GERD, and breast cancer. She presented to the ED 11/17/16 for sudden onset chest pain. Cardiology was consulted for ACS workup.  She also complained of worsening DOE over the past year.  She had a minimal bump in his Trop.  Left and right heart catheterization performed 11/19/2016 confirmed the presence of aortic stenosis with peak to peak and mean transvalvular gradients reported 29 and 21.7 mmHg respectively, corresponding to aortic valve area calculated 0.8 cm. The patient was noted to have 70% focal ostial stenosis of the first diagonal branch of the left anterior descending coronary artery, 30-40% stenosis of the proximal right coronary artery, and otherwise very mild nonobstructive coronary artery disease. There was mild pulmonary hypertension. Cardiothoracic surgical consultation was requested.  She is currently undergoing workup for TAVR by Dr. Ricard Dillon.  She is doing well today. She denies any chest pain.  Her SOB is unchanged.     Past Medical History:  Diagnosis Date  . Anginal pain ([place])   . Aortic stenosis    severe AS with moderate AR by echo with normal LVF  . Arthritis   . Cancer [facility])    left breast mastectomy(cancer)-surgery only  . Chronic diastolic congestive heart failure ([place])   . Complication of anesthesia    extremely claustophobic(only occurs with narcotic medications as stated per pt)  . Diverticulosis   . Fibromyalgia   . GERD (gastroesophageal reflux  disease)   . Hard of hearing    wears hearing aides   . History of breast cancer no recurrence   dx 2004   s/p  left breast mastectomy w/ node dissection/   no chemo or radiation  . History of TIA (transient ischemic attack)    per scan--  no residual  . Hyperlipidemia   . Hypertension   . Nocturia   . OSA on CPAP     severe/  AHI 34/hr  . Permanent atrial fibrillation (HCC)    on Xarelto for CHADS2VASC score of 5  . Pulmonary HTN    PASP 60mmHg by echo 11/2016  . Stroke ([place])    tia  . Urge urinary incontinence   . Wears glasses     Past Surgical History:  Procedure Laterality Date  . ABDOMINOPLASTY  1987  . APPENDECTOMY  1965  . BREAST SURGERY Left 2004   mastectomy   . CARDIAC CATHETERIZATION N/A 11/19/2016   Procedure: Right/Left Heart Cath and Coronary Angiography;  Surgeon: Troy Sine, MD;  Location: [facility];  Service: Cardiovascular;  Laterality: N/A;  . CARPAL TUNNEL RELEASE Right 07-08-2004  . COLONOSCOPY WITH ESOPHAGOGASTRODUODENOSCOPY (EGD)  2006  . EXPLANTATION BREAST IMPLANT AND DEBRIDEMENT Left 09/15/2005  . INTERSTIM IMPLANT PLACEMENT N/A 08/22/2014   Procedure: Barrie Lyme IMPLANT STAGE ONE/TWO;  Surgeon: Reece Packer, MD;  Location: [facility];  Service: Urology;  Laterality: N/A;  . JOINT REPLACEMENT Left 2017   knee replacement  . MASTECTOMY    .  MASTECTOMY, MODIFIED RADICAL W/RECONSTRUCTION  09-16-2003   left breast W/ SLN DISSECTION (pt states had 13 operations for implant infection   . REPLACE TISSUE EXPANDER LEFT BREAST AND TOTAL CAPSULECTOMY  08-05-2006  . REVISION BREAST RECONSTRUCTION Left 11-11-2004   12-03-2004  DRAINAGE SEREMA LEFT CHEST  . TEE WITHOUT CARDIOVERSION N/A 12/14/2016   Procedure: TRANSESOPHAGEAL ECHOCARDIOGRAM (TEE);  Surgeon: Burnell Blanks, MD;  Location: [facility];  Service: Open Heart Surgery;  Laterality: N/A;  . TOTAL HIP ARTHROPLASTY Left 07/17/2013   Procedure: LEFT TOTAL HIP ARTHROPLASTY  ANTERIOR APPROACH;  Surgeon: Mauri Pole, MD;  [facility];  Service: Orthopedics;  Laterality: Left;  . TOTAL HIP ARTHROPLASTY Right 01/28/2015   Procedure: RIGHT TOTAL HIP ARTHROPLASTY ANTERIOR APPROACH;  Surgeon: Paralee Cancel, MD;  [facility];  Service: Orthopedics;  Laterality: Right;  . TOTAL KNEE ARTHROPLASTY Left 11/11/2015   Procedure: LEFT TOTAL KNEE ARTHROPLASTY;  Surgeon: Paralee Cancel, MD;  [facility];  Service: Orthopedics;  Laterality: Left;  . TOTAL SHOULDER ARTHROPLASTY  08/11/2012   Procedure: TOTAL SHOULDER ARTHROPLASTY;  Surgeon: Augustin Schooling, MD;  Location: [facility];  Service: Orthopedics;  Laterality: Right;  RIGHT  TOTAL SHOULDER  ARTHROPLASTY   . TRANSCATHETER AORTIC VALVE REPLACEMENT, TRANSFEMORAL N/A 12/14/2016   Procedure: TRANSCATHETER AORTIC VALVE REPLACEMENT, TRANSFEMORAL;  Surgeon: Burnell Blanks, MD;  Location: [facility];  Service: Open Heart Surgery;  Laterality: N/A;  . TRANSTHORACIC ECHOCARDIOGRAM  02-25-2014   moderate LVH/  ef 55-60%/  mod. to sev. calcification AV with mild to moderate AV stenosis/  mild to mod. AR and MR/ mild PR/   moderate to severe LAE  . TUBAL LIGATION  1973    Current Medications: Current Meds  Medication Sig  . acetaminophen (TYLENOL) 325 MG tablet Take 1-2 tablets (325-650 mg total) by mouth every 4 (four) hours as needed for mild pain or moderate pain. (Patient taking differently: Take 650 mg by mouth every 4 (four) hours as needed for mild pain or moderate pain. )  . amoxicillin (AMOXIL) 500 MG capsule TAKE 4 CAPSULES BY MOUTH 1 HOUR BEFORE DENTAL APPT FOR PREMED  . Calcium Carbonate-Vitamin D (CALCIUM-VITAMIN D) 500-200 MG-UNIT tablet Take 1 tablet by mouth daily.  . clobetasol ointment (TEMOVATE) [id number]-[id number] % Apply 1 application topically 2 (two) times daily as needed (for skin irritation).  [place] Kitchen diltiazem (TIAZAC) 360 MG 24 hr capsule Take 360 mg by mouth every evening.  . furosemide (LASIX) 20 MG tablet TAKE 1 TABLET  (20 MG TOTAL) BY MOUTH DAILY.  [place] Kitchen glucosamine-chondroitin 500-400 MG tablet Take 1 tablet by mouth 2 (two) times daily.  Mar[place] Kitchen losartan (COZAAR) 50 MG tablet Take 50 mg by mouth daily.  . Melatonin 10 MG TABS Take 1 tablet by mouth at bedtime.   . nitroGLYCERIN (NITROSTAT) 0.4 MG SL tablet Place 0.4 mg under the tongue every 5 (five) minutes as needed for chest pain. For chest pain   . omeprazole (PRILOSEC) 20 MG capsule Take 20 mg by mouth daily.   . polycarbophil (FIBER-CAPS) 625 MG tablet Take 625 mg by mouth daily.  . predniSONE (DELTASONE) 5 MG tablet Take 5 mg by mouth daily with breakfast.  . rivaroxaban (XARELTO) 20 MG TABS tablet Take 20 mg by mouth daily with supper.  . triamcinolone cream (KENALOG) 0.1 % Apply 1 application topically as needed (for skin irritation).  . [DISCONTINUED] atorvastatin (LIPITOR) 40 MG tablet Take 20 mg by mouth daily.     Allergies:  Beta adrenergic blockers; Betapace [sotalol hcl]; Other; Oxycodone; Clonidine derivatives; Crestor [rosuvastatin calcium]; Adhesive [tape]; Latex; and Tramadol   Social History   Social History  . Marital status: Married    Spouse name: N/A  . Number of children: N/A  . Years of education: N/A   Social History Main Topics  . Smoking status: Former Smoker    Packs/day: 0.25    Years: 8.00    Types: Cigarettes    Quit date: 10/18/1966  . Smokeless tobacco: Never Used  . Alcohol use No  . Drug use: No  . Sexual activity: Yes   Other Topics Concern  . None   Social History Narrative  . None     Family History:  The patient's family history includes Heart disease in her father and mother.   ROS:   Please see the history of present illness.    ROS All other systems reviewed and are negative.  No flowsheet data found.     PHYSICAL EXAM:   VS:  BP (!) 146/60   Pulse 70   Ht 5\' 7"  (1.702 m)   Wt 185 lb 6.4 oz (84.1 kg)   BMI 29.04 kg/m    GEN: Well nourished, well developed, in no acute distress  HEENT:  normal  Neck: no JVD, carotid bruits, or masses Cardiac: RRR; no  rubs, or gallops,no edema.  Intact distal pulses bilaterally. 2/6 SM at RUSB to LLSB late peaking Respiratory:  clear to auscultation bilaterally, normal work of breathing GI: soft, nontender, nondistended, + BS MS: no deformity or atrophy  Skin: warm and dry, no rash Neuro:  Alert and Oriented x 3, Strength and sensation are intact Psych: euthymic mood, full affect  Wt Readings from Last 3 Encounters:  12/15/16 180 lb 8.9 oz (81.9 kg)  12/10/16 183 lb 1.6 oz (83.1 kg)  12/02/16 185 lb 6.4 oz (84.1 kg)      Studies/Labs Reviewed:   EKG:  EKG is not ordered today.    Recent Labs: 11/18/2016: B Natriuretic Peptide 352.5 12/10/2016: ALT 18 12/15/2016: BUN 13; Creatinine, Ser 0.88; Hemoglobin 12.9; Magnesium 1.8; Platelets 171; Potassium 4.1; Sodium 140   Lipid Panel No results found for: CHOL, TRIG, HDL, CHOLHDL, VLDL, LDLCALC, LDLDIRECT  Additional studies/ records that were reviewed today include:  none    ASSESSMENT:    1. Chronic diastolic congestive heart failure ([place])   2. Permanent atrial fibrillation ([place])   3. Severe aortic stenosis   4. Essential hypertension   5. Hypercholesteremia   6. Pulmonary HTN      PLAN:  In order of problems listed above:  1. Chronic diastolic CHF - she appears euvolemic on exam. She is asymptomatic.  Her weight is up but the last reading in office was a year ago.  Her discharge weight from 11/2016 was 173lbs and today in office is 185lbs.  LVEDP at time of cath was 26mmHg and mean PCW was 11mmHg but the catheter was only intermittently in wedge position.  She is asymptomatic and at this time I am not going to change her diuretics.     2. Permanent atrial fibrillation rate controlled.  She will continue on Cardizem for rate control and Xarelto.    3. Moderate to severe AS currently being worked up for TAVR.  4. HTN - BP controlled on current meds.  She will continue on  ARB/CCB.  5. Hyperlipidemia - LDL goal < 70. She will continue on statin.  Check FLP and ALT.  6. Pulmonary HTN - moderate by echo at 69mmHg and by right heart cath 58mmHg  likely secondary to group 2 pulmonary venous hypertension from severe AS/AI.  Hopefully this will improve post TAVR.  Will repeat echo post TAVR. She will continue on diuretic.   7. ASCAD - 1 vessel borderline obstructive disease of D1 and 35% ostial RCA.  She will continue on statin.  No ASA due to DOAC.    Medication Adjustments/Labs and Tests Ordered: Current medicines are reviewed at length with the patient today.  Concerns regarding medicines are outlined above.  Medication changes, Labs and Tests ordered today are listed in the Patient Instructions below.  Patient Instructions  Medication Instructions:  Your physician recommends that you continue on your current medications as directed. Please refer to the Current Medication list given to you today.   Labwork: None  Testing/Procedures: None  Follow-Up: Your physician recommends that you schedule a follow-up appointment AS NEEDED with Dr. Radford Pax.  Any Other Special Instructions Will Be Listed Below (If Applicable).     If you need a refill on your cardiac medications before your next appointment, please call your pharmacy.      Signed, Fransico Him, MD  12/15/2016 12:30 PM    [facility] [place], [place], [place]  [address] Phone: [phone]; Fax: [phone]

## 2016-12-06 ENCOUNTER — Encounter: Payer: Self-pay | Admitting: Surgery

## 2016-12-06 NOTE — Progress Notes (Signed)
Patient ID: [name], female   DOB: [birth year], 81 y.o.   MRN: [id number]   [facility] CONSULTATION REPORT  Referring Provider is Dorothy Spark, MD PCP is Jonathon Bellows, MD  Chief Complaint  Patient presents with  . Aortic Stenosis    2nd TAVR eval, review all studies     HPI:  The patient is a 80 year old woman with a history of aortic stenosis, persistent atrial fibrillation on Xarelto, hypertension, OSA on CPAP, chronic diastolic heart failure and degenerative arthritis s/p bilateral hip replacements, left total knee replacement and right total shoulder replacement. She has been followed with serial echocardiograms by Dr. Radford Pax and an echo in 10/2015 showed moderate AS with a mean gradient of 32 mm Hg. Over the past several months she has developed progressive exertional shortness of breath. This does not occur with normal activities in her house but walking fast will bring it on and she has to stop to rest. She had not had any chest pain or pressure and no dizziness or syncope. Then on 11/17/2016 when she developed sudden onset of chest discomfort. . She took a sublingual nitroglycerin with some relief but the pain did not resolve completely and it recurred in the early morning hours of 11/18/2016. She went to the ER where symptoms were again relieved by administration of nitroglycerin and morphine. Baseline EKG showed rate controlled atrial fibrillation without significant ST segment changes. Initial troponin was negative and follow-up troponin  was slightly elevated to 0.06. The patient was admitted to the hospital and has remained pain-free ever since. Transthoracic echocardiogram on 11/18/2016 showed severe aortic stenosis with a mean gradient of 35 mm Hg, a peak of 57 mm Hg and a DVI of 0.27. There was moderate aortic insufficiency, mild mitral regurgitation and normal left ventricular systolic  function with moderate left ventricular hypertrophy.  Left and right heart catheterization was performed 11/19/2016 showing severe aortic stenosis with peak to peak and mean transvalvular gradients of 29 and 21.7 mmHg respectively, corresponding to an aortic valve area calculated 0.8 cm. The patient was noted to have 70% focal ostial stenosis of the first diagonal branch, 30-40% stenosis of the proximal right coronary artery, and otherwise very mild nonobstructive coronary artery disease. There was mild pulmonary hypertension.   She is here with her husband today. They are from [place] and he worked as a Hydrologist for [employer]. 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 She has not been able to play lately due to exertional fatigue and shortness of breath.  Past Medical History:  Diagnosis Date  . Anginal pain ([place])   . Aortic stenosis   . Arthritis   . Atrial fibrillation, long-standing persistent ([place])   . Cancer [facility])    left breast mastectomy(cancer)-surgery only  . Chest pain 11/18/2016  . Chronic anticoagulation   . Chronic atrial fibrillation ([facility]) CARDIOLOGIST -- DR Tressia Miners TURNER   controlled  . Chronic diastolic congestive heart failure ([place])   . Complication of anesthesia    extremely claustophobic(only occurs with narcotic medications as stated per pt)  . Diverticulosis   . Dysrhythmia   . Fibromyalgia   . GERD (gastroesophageal reflux disease)   . Hard of hearing    wears hearing aides   . Heart murmur    mild to mod AS, mild to mod. AR and MR, mild  TR per echo 02-25-2014  .  History of breast cancer no recurrence   dx 2004   s/p  left breast mastectomy w/ node dissection/   no chemo or radiation  . History of nuclear stress test    Myoview 1/17: EF 54%, apical thinning, no ischemia, low risk  . History of TIA (transient ischemic attack)    per scan--  no residual  . Hyperlipidemia   . Hypertension   . Nocturia   . OSA on CPAP     severe/  AHI 34/hr    . Short of breath on exertion    01-16-15 not a problem now.  . Urge urinary incontinence   . Wears glasses     Past Surgical History:  Procedure Laterality Date  . ABDOMINOPLASTY  1987  . APPENDECTOMY  1965  . BREAST SURGERY    . CARDIAC CATHETERIZATION N/A 11/19/2016   Procedure: Right/Left Heart Cath and Coronary Angiography;  Surgeon: Troy Sine, MD;  Location: [facility];  Service: Cardiovascular;  Laterality: N/A;  . CARPAL TUNNEL RELEASE Right 07-08-2004  . COLONOSCOPY WITH ESOPHAGOGASTRODUODENOSCOPY (EGD)  2006  . EXPLANTATION BREAST IMPLANT AND DEBRIDEMENT  09-15-2005  . INTERSTIM IMPLANT PLACEMENT N/A 08/22/2014   Procedure: Barrie Lyme IMPLANT STAGE ONE/TWO;  Surgeon: Reece Packer, MD;  Location: [facility];  Service: Urology;  Laterality: N/A;  . MASTECTOMY    . MASTECTOMY, MODIFIED RADICAL W/RECONSTRUCTION  09-16-2003   left breast W/ SLN DISSECTION (pt states had 13 operations for implant infection   . REPLACE TISSUE EXPANDER LEFT BREAST AND TOTAL CAPSULECTOMY  08-05-2006  . REVISION BREAST RECONSTRUCTION Left 11-11-2004   12-03-2004  DRAINAGE SEREMA LEFT CHEST  . TOTAL HIP ARTHROPLASTY Left 07/17/2013   Procedure: LEFT TOTAL HIP ARTHROPLASTY ANTERIOR APPROACH;  Surgeon: Mauri Pole, MD;  Location: [facility];  Service: Orthopedics;  Laterality: Left;  . TOTAL HIP ARTHROPLASTY Right 01/28/2015   Procedure: RIGHT TOTAL HIP ARTHROPLASTY ANTERIOR APPROACH;  Surgeon: Paralee Cancel, MD;  Location: [facility];  Service: Orthopedics;  Laterality: Right;  . TOTAL KNEE ARTHROPLASTY Left 11/11/2015   Procedure: LEFT TOTAL KNEE ARTHROPLASTY;  Surgeon: Paralee Cancel, MD;  Location: [facility];  Service: Orthopedics;  Laterality: Left;  . TOTAL SHOULDER ARTHROPLASTY  08/11/2012   Procedure: TOTAL SHOULDER ARTHROPLASTY;  Surgeon: Augustin Schooling, MD;  Location: [facility];  Service: Orthopedics;  Laterality: Right;  RIGHT  TOTAL SHOULDER  ARTHROPLASTY   . TRANSTHORACIC  ECHOCARDIOGRAM  02-25-2014   moderate LVH/  ef 55-60%/  mod. to sev. calcification AV with mild to moderate AV stenosis/  mild to mod. AR and MR/ mild PR/   moderate to severe LAE  . TUBAL LIGATION  1973    Family History  Problem Relation Age of Onset  . Heart disease Mother   . Heart disease Father     Social History   Social History  . Marital status: Married    Spouse name: N/A  . Number of children: N/A  . Years of education: N/A   Occupational History  . Not on file.   Social History Main Topics  . Smoking status: Former Smoker    Packs/day: 0.25    Years: 8.00    Types: Cigarettes    Quit date: 10/18/1966  . Smokeless tobacco: Never Used  . Alcohol use No  . Drug use: No  . Sexual activity: Yes   Other Topics Concern  . Not on file   Social History Narrative  . No narrative on  file    Current Outpatient Prescriptions  Medication Sig Dispense Refill  . acetaminophen (TYLENOL) 325 MG tablet Take 1-2 tablets (325-650 mg total) by mouth every 4 (four) hours as needed for mild pain or moderate pain.    Marland Kitchen amoxicillin (AMOXIL) 500 MG capsule TAKE 4 CAPSULES BY MOUTH 1 HOUR BEFORE DENTAL APPT FOR PREMED  0  . atorvastatin (LIPITOR) 40 MG tablet Take 20 mg by mouth daily.     . Calcium Carbonate-Vitamin D (CALCIUM-VITAMIN D) 500-200 MG-UNIT tablet Take 1 tablet by mouth daily.    . clobetasol ointment (TEMOVATE) [id number] % Apply 1 application topically 2 (two) times daily as needed (for skin irritation).    Marland Kitchen diltiazem (TIAZAC) 360 MG 24 hr capsule Take 360 mg by mouth every evening.    . furosemide (LASIX) 20 MG tablet TAKE 1 TABLET (20 MG TOTAL) BY MOUTH DAILY. 90 tablet 1  . glucosamine-chondroitin 500-400 MG tablet Take 1 tablet by mouth 2 (two) times daily.    Marland Kitchen losartan (COZAAR) 50 MG tablet Take 50 mg by mouth daily.    . Melatonin 10 MG TABS Take 1 tablet by mouth at bedtime.     . nitroGLYCERIN (NITROSTAT) 0.4 MG SL tablet Place 0.4 mg under the tongue every 5  (five) minutes as needed. For chest pain    . omeprazole (PRILOSEC) 20 MG capsule Take 20 mg by mouth every morning.     . polycarbophil (FIBER-CAPS) 625 MG tablet Take 625 mg by mouth daily.    . predniSONE (DELTASONE) 5 MG tablet Take 5 mg by mouth daily with breakfast.    . rivaroxaban (XARELTO) 20 MG TABS tablet Take 20 mg by mouth daily with supper.    . triamcinolone cream (KENALOG) 0.1 % Apply 1 application topically as needed (for skin irritation).     No current facility-administered medications for this visit.     Allergies  Allergen Reactions  . Beta Adrenergic Blockers Shortness Of Breath  . Betapace [Sotalol Hcl] Shortness Of Breath  . Other     Narcotic pain medications - anxiety attacks  . Oxycodone Shortness Of Breath    Tolerates Dilaudid, Fentanyl  . Crestor [Rosuvastatin Calcium] Other (See Comments)    Cough, leg cramps  . Adhesive [Tape] Rash    Adhesive bandaids   . Clonidine Derivatives Palpitations    bradycardia  . Latex Rash    Per patient  . Tramadol Anxiety      Review of Systems:         General:                      normal appetite, decreased energy, no weight gain, no weight loss, no fever             Cardiac:                       no chest pain with exertion, + single episode chest pain at rest, + SOB with exertion, no resting SOB, no PND, no orthopnea, no palpitations, + arrhythmia, + atrial fibrillation, no LE edema, no dizzy spells, no syncope             Respiratory:                 + exertional shortness of breath, no home oxygen, no productive cough, no dry cough, no bronchitis, no wheezing, no hemoptysis, no asthma, no pain with inspiration or cough, +  sleep apnea, + CPAP at night             GI:                               no difficulty swallowing, no reflux, no frequent heartburn, no hiatal hernia, no abdominal pain, no constipation, no diarrhea, no hematochezia, no hematemesis, no melena             GU:                              no  dysuria,  no frequency, no urinary tract infection, no hematuria, no kidney stones, no kidney disease             Vascular:                     no pain suggestive of claudication, no pain in feet, no leg cramps, no varicose veins, no DVT, no non-healing foot ulcer             Neuro:                         no stroke, no TIA's, no seizures, no headaches, no temporary blindness one eye,  no slurred speech, no peripheral neuropathy, no chronic pain, no instability of gait, no memory/cognitive dysfunction             Musculoskeletal:         no arthritis, no joint swelling, no myalgias, no difficulty walking, normal mobility              Skin:                            no rash, no itching, no skin infections, no pressure sores or ulcerations             Psych:                         no anxiety, no depression, no nervousness, no unusual recent stress             Eyes:                           no blurry vision, no floaters, no recent vision changes, + wears glasses or contacts             ENT:                            no hearing loss, no loose or painful teeth, no dentures, last saw dentist within the past year             Hematologic:               no easy bruising, no abnormal bleeding, no clotting disorder, no frequent epistaxis             Endocrine:                   no diabetes, does not check CBG's at home  Physical Exam:   BP 126/69 (BP Location: Right Arm, Patient Position: Sitting, Cuff Size: Large)   Pulse 64   Resp 16   Ht 5\' 7"  (1.702 m)   Wt 173 lb (78.5 kg)   SpO2 98% Comment: RA  BMI 27.10 kg/m   General:  Elderly but  well-appearing  HEENT:  Unremarkable , NCAT , PERLA, EOMI, oropharynx clear. Teeth in good condition  Neck:   no JVD, no bruits, no adenopathy or thyromegaly  Chest:   clear to auscultation, symmetrical breath sounds, no wheezes, no rhonchi   CV:   RRR, grade III/VI crescendo/decrescendo murmur heard best at RSB,  no diastolic  murmur  Abdomen:  soft, non-tender, no masses or organomegaly  Extremities:  warm, well-perfused, pulses diminished in feet, no LE edema  Rectal/GU  Deferred  Neuro:   Grossly non-focal and symmetrical throughout  Skin:   Clean and dry, no rashes, no breakdown   Diagnostic Tests:            *[place]*                   *[facility]*                         [address], [place] [address]                            [phone]  ------------------------------------------------------------------- Transthoracic Echocardiography  Patient:    Ellierose, Hailu MR #:       [id number] Study Date: 11/18/2016 Gender:     F Age:        70 Height:     170.2 cm Weight:     78.5 kg BSA:        1.94 m^2 Pt. Status: Room:       2W13C   SONOGRAPHER  Tresa Res, RDCS  Laverda Page, [place]  REFERRING    Berle Mull M  ATTENDING    Ward, Delice Bison  PERFORMING   [facility], Inpatient  ADMITTING    Myrene Buddy P  cc:  ------------------------------------------------------------------- LV EF: 60% -   65%  ------------------------------------------------------------------- Indications:      Chest pain 786.51.  ------------------------------------------------------------------- History:   PMH:   Congestive heart failure.  Aortic valve disease. Transient ischemic attack.  Risk factors:  Former tobacco use. Hypertension.  ------------------------------------------------------------------- Study Conclusions  - Left ventricle: The cavity size was normal. Wall thickness was   increased in a pattern of moderate LVH. Systolic function was   normal. The estimated ejection fraction was in the range of 60%   to 65%. Wall motion was normal; there were no regional wall   motion abnormalities. Doppler parameters are consistent with high   ventricular filling pressure. - Aortic valve: Valve mobility was restricted. There  was moderate   to severe stenosis. There was moderate regurgitation. Valve area   (VTI): 0.75 cm^2. Valve area (Vmax): 0.77 cm^2. Valve area   (Vmean): 0.69 cm^2. - Mitral valve: Calcified annulus. Mildly thickened leaflets .   There was mild regurgitation. Valve area by continuity equation   (using LVOT flow): 2.42 cm^2. - Left atrium: The atrium was severely dilated. - Right ventricle: The cavity size was mildly  dilated. - Right atrium: The atrium was severely dilated. - Pulmonary arteries: Systolic pressure was moderately increased.   PA peak pressure: 50 mm Hg (S).  Impressions:  - Normal LV systolic function; moderate LVH; elevated LV filling   pressure; biatrial enlargement; mild RVE; calcified aortic valve   with moderate to severe AS and moderate AI; mild MR and TR;   moderately elevated pulmonary pressure.  ------------------------------------------------------------------- Study data:  Comparison was made to the study of 11/06/2015.  Study status:  Routine.  Study completion:  There were no complications.         Transthoracic echocardiography.  M-mode, complete 2D, spectral Doppler, and color Doppler.  Birthdate:  Patient birthdate: 1936-04-07.  Age:  Patient is 81 yr old.  Sex:  Gender: female.    BMI: 27.1 kg/m^2.  Blood pressure:     153/46  Patient status:  Inpatient.  Study date:  Study date: 11/18/2016. Study time: 10:07 AM.  Location:  Bedside.  -------------------------------------------------------------------  ------------------------------------------------------------------- Left ventricle:  The cavity size was normal. Wall thickness was increased in a pattern of moderate LVH. Systolic function was normal. The estimated ejection fraction was in the range of 60% to 65%. Wall motion was normal; there were no regional wall motion abnormalities. The study was not technically sufficient to allow evaluation of LV diastolic dysfunction due to atrial  fibrillation. Doppler parameters are consistent with high ventricular filling pressure.  ------------------------------------------------------------------- Aortic valve:   Trileaflet; moderately calcified leaflets. Valve mobility was restricted.  Doppler:   There was moderate to severe stenosis.   There was moderate regurgitation.    VTI ratio of LVOT to aortic valve: 0.26. Valve area (VTI): 0.75 cm^2. Indexed valve area (VTI): 0.39 cm^2/m^2. Peak velocity ratio of LVOT to aortic valve: 0.27. Valve area (Vmax): 0.77 cm^2. Indexed valve area (Vmax): 0.4 cm^2/m^2. Mean velocity ratio of LVOT to aortic valve: 0.24. Valve area (Vmean): 0.69 cm^2. Indexed valve area (Vmean): 0.36 cm^2/m^2.    Mean gradient (S): 35 mm Hg. Peak gradient (S): 57 mm Hg.  ------------------------------------------------------------------- Aorta:  Aortic root: The aortic root was normal in size.  ------------------------------------------------------------------- Mitral valve:   Calcified annulus. Mildly thickened leaflets . Mobility was not restricted.  Doppler:  Transvalvular velocity was within the normal range. There was no evidence for stenosis. There was mild regurgitation.    Valve area by pressure half-time: 3.61 cm^2. Indexed valve area by pressure half-time: 1.86 cm^2/m^2. Valve area by continuity equation (using LVOT flow): 2.42 cm^2. Indexed valve area by continuity equation (using LVOT flow): 1.25 cm^2/m^2.    Mean gradient (D): 2 mm Hg. Peak gradient (D): 7 mm Hg.  ------------------------------------------------------------------- Left atrium:  The atrium was severely dilated.  ------------------------------------------------------------------- Right ventricle:  The cavity size was mildly dilated. Systolic function was normal.  ------------------------------------------------------------------- Pulmonic valve:    Doppler:  Transvalvular velocity was within the normal range. There was  no evidence for stenosis. There was mild regurgitation.  ------------------------------------------------------------------- Tricuspid valve:   Structurally normal valve.    Doppler: Transvalvular velocity was within the normal range. There was mild regurgitation.  ------------------------------------------------------------------- Pulmonary artery:   Systolic pressure was moderately increased.  ------------------------------------------------------------------- Right atrium:  The atrium was severely dilated.  ------------------------------------------------------------------- Pericardium:  There was no pericardial effusion.  ------------------------------------------------------------------- Measurements   Left ventricle                           Value  Reference  LV ID, ED, PLAX chordal                  43.4  mm       43 - 52  LV ID, ES, PLAX chordal                  29    mm       23 - 38  LV fx shortening, PLAX chordal           33    %        >=29  LV PW thickness, ED                      14.3  mm       ----------  IVS/LV PW ratio, ED                      1.01           <=1.3  Stroke volume, 2D                        74    ml       ----------  Stroke volume/bsa, 2D                    38    ml/m^2   ----------  LV e&', lateral                           10.8  cm/s     ----------  LV E/e&', lateral                         12.04          ----------  LV e&', medial                            6.26  cm/s     ----------  LV E/e&', medial                          20.77          ----------  LV e&', average                           8.53  cm/s     ----------  LV E/e&', average                         15.24          ----------    Ventricular septum                       Value          Reference  IVS thickness, ED                        14.4  mm       ----------    LVOT                                     Value  Reference  LVOT ID, S                                19    mm       ----------  LVOT area                                2.84  cm^2     ----------  LVOT peak velocity, S                    102   cm/s     ----------  LVOT mean velocity, S                    67.3  cm/s     ----------  LVOT VTI, S                              26    cm       ----------    Aortic valve                             Value          Reference  Aortic valve peak velocity, S            378   cm/s     ----------  Aortic valve mean velocity, S            277   cm/s     ----------  Aortic valve VTI, S                      99.1  cm       ----------  Aortic mean gradient, S                  35    mm Hg    ----------  Aortic peak gradient, S                  57    mm Hg    ----------  VTI ratio, LVOT/AV                       0.26           ----------  Aortic valve area, VTI                   0.75  cm^2     ----------  Aortic valve area/bsa, VTI               0.39  cm^2/m^2 ----------  Velocity ratio, peak, LVOT/AV            0.27           ----------  Aortic valve area, peak velocity         0.77  cm^2     ----------  Aortic valve area/bsa, peak              0.4   cm^2/m^2 ----------  velocity  Velocity ratio, mean, LVOT/AV            0.24           ----------  Aortic valve area, mean velocity  0.69  cm^2     ----------  Aortic valve area/bsa, mean              0.36  cm^2/m^2 ----------  velocity  Aortic regurg pressure half-time         677   ms       ----------    Aorta                                    Value          Reference  Aortic root ID, ED                       28    mm       ----------    Left atrium                              Value          Reference  LA ID, A-P, ES                           54    mm       ----------  LA ID/bsa, A-P                   (H)     2.78  cm/m^2   <=2.2  LA volume, S                             180   ml       ----------  LA volume/bsa, S                         92.7  ml/m^2   ----------  LA volume, ES, 1-p A4C                    203   ml       ----------  LA volume/bsa, ES, 1-p A4C               104.5 ml/m^2   ----------  LA volume, ES, 1-p A2C                   152   ml       ----------  LA volume/bsa, ES, 1-p A2C               78.2  ml/m^2   ----------    Mitral valve                             Value          Reference  Mitral E-wave peak velocity              130   cm/s     ----------  Mitral mean velocity, D                  61.2  cm/s     ----------  Mitral deceleration time                 165   ms       150 - 230  Mitral pressure half-time                57    ms       ----------  Mitral mean gradient, D                  2     mm Hg    ----------  Mitral peak gradient, D                  7     mm Hg    ----------  Mitral valve area, PHT, DP               3.61  cm^2     ----------  Mitral valve area/bsa, PHT, DP           1.86  cm^2/m^2 ----------  Mitral valve area, LVOT                  2.42  cm^2     ----------  continuity  Mitral valve area/bsa, LVOT              1.25  cm^2/m^2 ----------  continuity  Mitral annulus VTI, D                    30.5  cm       ----------    Pulmonary arteries                       Value          Reference  PA pressure, S, DP               (H)     50    mm Hg    <=30    Tricuspid valve                          Value          Reference  Tricuspid regurg peak velocity           323   cm/s     ----------  Tricuspid peak RV-RA gradient            42    mm Hg    ----------    Right atrium                             Value          Reference  RA ID, S-I, ES, A4C              (H)     68.8  mm       34 - 49  RA area, ES, A4C                 (H)     27.6  cm^2     8.3 - 19.5  RA volume, ES, A/L                       99.2  ml       ----------  RA volume/bsa, ES, A/L                   51.1  ml/m^2   ----------    Systemic veins  Value          Reference  Estimated CVP                            8     mm Hg    ----------    Right ventricle                           Value          Reference  RV pressure, S, DP               (H)     50    mm Hg    <=30  RV s&', lateral, S                        10.4  cm/s     ----------  Legend: (L)  and  (H)  mark values outside specified reference range.  ------------------------------------------------------------------- Prepared and Electronically Authenticated by  Kirk Ruths 2018-02-01T12:45:08  [name]  Cardiac catheterization  Order# [id number]  Reading physician: Troy Sine, MD Ordering physician: Troy Sine, MD Study date: 11/19/16  Physicians   Panel Physicians Referring Physician Case Authorizing Physician  Troy Sine, MD (Primary)    Procedures   Right/Left Heart Cath and Coronary Angiography  Conclusion     Ost RCA lesion, 35 %stenosed.  Ost 1st Diag lesion, 70 %stenosed.  The left ventricular systolic function is normal.  LV end diastolic pressure is normal.  There is severe aortic valve stenosis.   Normal LV function with at least 1+ MR.  Mild right heart pressure elevation with mild pulmonary hypertension.  Moderately severe aortic valve stenosis with a peak gradient of 29 and mean gradient of 21.7.  Aortic valve area calculated to 0.8 cm2.   Mild coronary obstructive disease with 70% focal eccentric ostial narrowing of the first diagonal branch of the LAD and superior takeoff dominant RCA with mild 30-40% very proximal stenosis eccentrically.  Normal ramus intermediate and left circumflex coronary arteries.  Mild calcification of the ascending aorta.  Supravalvular aortography was not performed to assess for aortic regurgitation.  RECOMMENDATION: Consider surgical evaluation for TAVR     Indications   Nonrheumatic aortic valve stenosis [I35.0 (ICD-10-CM)]  Shortness of breath [R06.02 (ICD-10-CM)]  Chest pain, unspecified type [R07.9 (ICD-10-CM)]  Procedural Details/Technique   Technical Details Mrs. Tramiyah Quaye is  a 81 year old female who is originally from [place]. She has a history of permanent atrial fibrillation on anticoagulation, chronic diastolic heart failure, hypertension, obesity, and has been found to have moderately severe aortic stenosis. She has noticed increasing shortness of breath. An echo Doppler study showed normal LV systolic function; moderate LVH; elevated LV filling pressure; biatrial enlargement; mild RVE; calcified aortic valve with moderate to severe AS and moderate AI; mild MR and TR; and moderately elevated pulmonary pressure. She is referred for right and left heart cardiac catheterization.  The patient was brought to the second floor [facility] cath lab in the fasting state. The right groin was prepped and draped in sterile fashion and a 5 [place] arterial sheath and 7 French venous sheath were inserted without difficulty. A Swan-Ganz catheter was advanced into the venous sheath and pressures were obtained in the right atrium, right ventricle, pulmonary artery, and pulmonary capillary wedge position. Cardiac outputs were obtained by the thermodilution and  assumed Fick methods. Oxygen saturation was obtained in the pulmonary artery and aorta. A pigtail catheter was inserted and simultaneous AO/PA pressures were recorded. The pigtail catheter was advanced into the left ventricle and simultaneous left ventricular and PCW pressures were recorded. Left ventriculography was performed in the RAO projection. A left ventricle to aorta pullback was performed. The pigtail catheter was then removed and diagnostic catheterization to delineate the coronary anatomy was performed utilizing 5 French Judkins 4 left and right and AR2 diagnostic catheters. All catheters were removed and the patient. Hemostasis was obtained by direct manual pressure. The patient tolerated the procedure well and returned to his room in satisfactory condition.    Estimated blood loss <50 mL.  During this procedure the  patient was administered the following to achieve and maintain moderate conscious sedation: Versed 2 mg, Fentanyl 50 mcg, while the patient's heart rate, blood pressure, and oxygen saturation were continuously monitored. The period of conscious sedation was 59 minutes, of which I was present face-to-face 100% of this time.    Coronary Findings   Dominance: Right  Left Main  Vessel was injected. Vessel is normal in caliber. Vessel is angiographically normal.  Left Anterior Descending  The LAD was a moderate size vessel that gave rise to a proximal diagonal vessel. There was 70% eccentric ostial narrowing in this diagonal vessel. Remainder of the LAD was angiographically normal and did not reach the LV apex.  First Diagonal Branch  Ost 1st Diag lesion, 70% stenosed. The lesion is eccentric.  Ramus Intermedius  Vessel was injected. Vessel is normal in caliber. Vessel is angiographically normal.  Left Circumflex  Vessel was injected. Vessel is normal in caliber. Vessel is angiographically normal.  Right Coronary Artery  The RCA was a dominant vessel that had a superior takeoff. There was mild calcification proximally with 30-40% eccentric narrowing; the RCA. A gave rise to a large PDA and PLA vessels which extended to the apex.  Ost RCA lesion, 35% stenosed.  Right Heart   Right Heart Pressures RA: Mean 7 RV: 42/5 PA: 42/23; mean 31 PW: Mean 23 and sees the patient was in AF and the catheter seemed to intermittently bounce in and out of the wedge position.  AO: 170/72 PA: 46/17  LV: 206/12 PW: 27 ( again, the catheter seemed to intermittently go in and out of the wedge position, and this may conrtributing to elevation)  LV: 208/13 AO: 179/87  Oxygen saturation in the aorta is 93% in the pulmonary artery is 65%.  Cardiac by the thermodilution method is 4.0 and by the Fick method is 5.1 L/m. Cardiac index is 2.1 (T) and 2.7 L/m/m (F), respectively    Wall Motion                Left Heart   Left Ventricle The left ventricular size is normal. The left ventricular systolic function is normal. LV end diastolic pressure is normal. No regional wall motion abnormalities.    Aortic Valve There is severe aortic valve stenosis. The aortic valve is calcified. There is abnormal aortic valve motion. Aortic valve peak to peak gradient was 29 mm with a mean gradient of 21.7 mm. Calculated aortic valve area was 0.8 cm. This is consistent with moderately severe aortic stenosis.    Aorta Aortic Root: Aortic root appeared upper normal in size. There was calcification in the wall of the ascending aorta distal to the aortic arch.    Coronary Diagrams   Diagnostic Diagram  Implants     No implant documentation for this case.  PACS Images   Show images for Cardiac catheterization   Link to Procedure Log   Procedure Log    Hemo Data   Flowsheet Row Most Recent Value  Fick Cardiac Output 5.13 L/min  Fick Cardiac Output Index 2.69 (L/min)/BSA  Thermal Cardiac Output 4.03 L/min  Thermal Cardiac Output Index 2.11 (L/min)/BSA  Aortic Mean Gradient 21.7 mmHg  Aortic Peak Gradient 29 mmHg  Aortic Valve Area 0.80  Aortic Value Area Index 0.42 cm2/BSA  RA A Wave 7 mmHg  RA V Wave 9 mmHg  RA Mean 7 mmHg  RV Systolic Pressure 42 mmHg  RV Diastolic Pressure 1 mmHg  RV EDP 5 mmHg  PA Systolic Pressure 44 mmHg  PA Diastolic Pressure 20 mmHg  PA Mean 31 mmHg  PW A Wave 27 mmHg  PW V Wave 34 mmHg  PW Mean 29 mmHg  AO Systolic Pressure 0000000 mmHg  AO Diastolic Pressure 91 mmHg  AO Mean 0000000 mmHg  LV Systolic Pressure 99991111 mmHg  LV Diastolic Pressure 9 mmHg  LV EDP 12 mmHg  Arterial Occlusion Pressure Extended Systolic Pressure 0000000 mmHg  Arterial Occlusion Pressure Extended Diastolic Pressure 87 mmHg  Arterial Occlusion Pressure Extended Mean Pressure 126 mmHg  Left Ventricular Apex Extended Systolic Pressure 123XX123 mmHg  Left Ventricular Apex Extended Diastolic Pressure 8  mmHg  Left Ventricular Apex Extended EDP Pressure 13 mmHg  TPVR Index 14.69 HRUI  TSVR Index 51.66 HRUI  PVR SVR Ratio 0.08  TPVR/TSVR Ratio 0.28   ADDENDUM REPORT: 11/30/2016 12:39  CLINICAL DATA:  81 year old with severe aortic stenosis.  EXAM: Cardiac TAVR CT  TECHNIQUE: The patient was scanned on a Philips 256 scanner. A 120 kV retrospective scan was triggered in the descending thoracic aorta at 111 HU's. Gantry rotation speed was 270 msecs and collimation was .9 mm. 5 mg of iv Metoprolol and no nitro were given. The 3D data set was reconstructed in 5% intervals of the R-R cycle. Systolic and diastolic phases were analyzed on a dedicated work station using MPR, MIP and VRT modes. The patient received 80 cc of contrast.  FINDINGS: Aortic Valve: Severely calcified and thickened aortic valve with severely restricted leaflet opening. Minimal calcifications in the LVOT.  Aorta:  Normal size, moderate diffuse calcifications.  Sinotubular Junction:  30 x 28 mm  Ascending Thoracic Aorta:  34 x 33 mm  Aortic Arch:  30 x 28 mm  Descending Thoracic Aorta:  25 x 24 mm  Sinus of Valsalva Measurements:  Non-coronary:  28 mm  Right -coronary:  29 mm  Left -coronary:  32 mm  Coronary Artery Height above Annulus:  Left Main:  12 mm  Right Coronary:  15 mm  Virtual Basal Annulus Measurements:  Maximum/Minimum Diameter:  25 x 22 mm  Perimeter:  88 mm  Area:  443 mm2  Coronary Arteries:  Normal origin.  Optimum Fluoroscopic Angle for Delivery:  LAO 9 CAU 8  Severely dilated left atrium with a filling defect at the most superior and anterior portion of the left atrial appendage.  Dilated pulmonary artery measuring 33 x 31 mm suggestive of pulmonary hypertension.  IMPRESSION: 1. Severely calcified and thickened aortic valve with severely restricted leaflet opening and minimal calcifications in the LVOT. Annular measurements  suitable  2. There is sufficient annulus to coronary distance.  3. Optimum Fluoroscopic Angle for Delivery:  LAO 10 CAU 8  4. Severely dilated left atrium  with a filling defect at the most superior and anterior portion of the left atrial appendage. This most probably represents an insufficient contrast filling during the acquisition, however a correlation with an intraoperative TEE is recommended.  5. Dilated pulmonary artery measuring 33 x 31 mm suggestive of pulmonary hypertension.  Ena Dawley   Electronically Signed   By: Ena Dawley   On: 11/30/2016 12:39   Addended by Dorothy Spark, MD on 11/30/2016 12:41 PM    Study Result   EXAM: OVER-READ INTERPRETATION  CT CHEST  The following report is an over-read performed by radiologist Dr. Rebekah [facility] on 11/29/2016. This over-read does not include interpretation of cardiac or coronary anatomy or pathology. The coronary calcium score/coronary CTA interpretation by the cardiologist is attached.  COMPARISON:  Chest CT 03/07/2011.  FINDINGS: Full discussion of relevant extracardiac findings will be rendered on dictation for contemporaneously obtained CTA of the chest, abdomen and pelvis 11/29/2016.  IMPRESSION: Please see separate dictation for contemporaneously obtained CTA of the chest, abdomen and pelvis dated 11/29/2016 for full description of extracardiac findings.  Electronically Signed: By: Vinnie Langton M.D. On: 11/29/2016 12:52        CT Angio Abd/Pel w/ and/or w/o (Accession [id number]) (Order [id number])  Imaging  Date: 11/29/2016 Department: [facility] CT IMAGING Released By: Selmer Dominion Authorizing: Rexene Alberts, MD  Exam Information   Status Exam Begun  Exam Ended   Final [99] 11/29/2016 12:18 PM 11/29/2016 12:44 PM  PACS Images   Show images for CT Angio Abd/Pel w/ and/or w/o  Study Result   CLINICAL DATA:   81 year old female with history of severe aortic stenosis. Preprocedural study prior to potential transcatheter aortic valve replacement (TAVR).  EXAM: CT ANGIOGRAPHY CHEST, ABDOMEN AND PELVIS  TECHNIQUE: Multidetector CT imaging through the chest, abdomen and pelvis was performed using the standard protocol during bolus administration of intravenous contrast. Multiplanar reconstructed images and MIPs were obtained and reviewed to evaluate the vascular anatomy.  CONTRAST:  80 mL of Isovue 370.  COMPARISON:  CT the abdomen and pelvis 03/20/2011. Chest CT 03/07/2011.  FINDINGS: CTA CHEST FINDINGS  Cardiovascular: Heart size is enlarged with left atrial dilatation. Notably, within the tip of the left atrial appendage there is a filling defect, concerning for left atrial appendage thrombus. There is no significant pericardial fluid, thickening or pericardial calcification. There is aortic atherosclerosis, as well as atherosclerosis of the great vessels of the mediastinum and the coronary arteries, including calcified atherosclerotic plaque in the left main, left anterior descending and right coronary arteries. Severe thickening calcification of the aortic valve. Aberrant right subclavian artery (normal anatomical variant).  Mediastinum/Lymph Nodes: No pathologically enlarged mediastinal or hilar lymph nodes. Esophagus is unremarkable in appearance. No axillary lymphadenopathy.  Lungs/Pleura: No suspicious appearing pulmonary nodules or masses. No acute consolidative airspace disease. No pleural effusions.  Musculoskeletal/Soft Tissues: Status post left modified radical mastectomy with left-sided breast implant. Surgical clips in the left axilla from prior lymph node dissection. Status post right shoulder arthroplasty. There are no aggressive appearing lytic or blastic lesions noted in the visualized portions of the skeleton.  CTA ABDOMEN AND PELVIS  FINDINGS  Hepatobiliary: The liver has a shrunken appearance and nodular contour, suggestive of underlying cirrhosis. No discrete cystic or solid hepatic lesions. No intra or extrahepatic biliary ductal dilatation. Several partially calcified gallstones are noted lying dependently in the gallbladder. No current findings to suggest an acute cholecystitis at this time.  Pancreas: No pancreatic mass. No pancreatic ductal dilatation. No pancreatic or peripancreatic fluid or inflammatory changes.  Spleen: Unremarkable.  Adrenals/Urinary Tract: 1.6 cm low-attenuation lesion in the interpolar region of the right kidney is compatible with a simple cyst. Several other subcentimeter low-attenuation lesions in both kidneys are too small to definitively characterize, but are favored to represent tiny cysts. No hydroureteronephrosis. Urinary bladder is partially obscured by extensive beam hardening artifact from the patient's bilateral hip arthroplasties, but the visualized portions of the urinary bladder are unremarkable.  Stomach/Bowel: The appearance of the stomach is normal. There is no pathologic dilatation of small bowel or colon. Several colonic diverticulae are noted, without surrounding inflammatory changes to suggest an acute diverticulitis at this time. The appendix is not confidently identified and may be surgically absent. Regardless, there are no inflammatory changes noted adjacent to the cecum to suggest the presence of an acute appendicitis at this time.  Vascular/Lymphatic: Aortic atherosclerosis, with vascular findings and measurements pertinent to potential TAVR procedure, as detailed below. No aneurysm or dissection identified in the abdominal or pelvic vasculature. Celiac axis, superior mesenteric artery and inferior mesenteric artery and their major branches appear patent without definite hemodynamically significant stenosis. Single renal arteries bilaterally both  appear widely patent. No lymphadenopathy noted in the abdomen or pelvis.  Reproductive: Uterus and ovaries are unremarkable in appearance.  Other: No significant volume of ascites.  No pneumoperitoneum.  Musculoskeletal: Bilateral total hip arthroplasties. There are no aggressive appearing lytic or blastic lesions noted in the visualized portions of the skeleton.  VASCULAR MEASUREMENTS PERTINENT TO TAVR:  AORTA:  Minimal Aortic Diameter -  14 x 15 mm  Severity of Aortic Calcification -  moderate to severe  RIGHT PELVIS:  Right Common Iliac Artery -  Minimal Diameter - 11.3 x 5.3 mm  Tortuosity - mild  Calcification - severe  Right External Iliac Artery -  Minimal Diameter - 8.4 x 8.6 mm  Tortuosity - moderate  Calcification - minimal  Right Common Femoral Artery -  Minimal Diameter - 7.2 x 5.8 mm  Tortuosity - mild  Calcification - moderate  LEFT PELVIS:  Left Common Iliac Artery -  Minimal Diameter - 8.1 x 6.4 mm  Tortuosity - mild  Calcification - severe  Left External Iliac Artery -  Minimal Diameter - 8.6 x 6.7 mm  Tortuosity - moderate to severe  Calcification - minimal  Left Common Femoral Artery -  Minimal Diameter - 6.7 x 5.2 mm  Tortuosity - mild  Calcification - moderate  Review of the MIP images confirms the above findings.  IMPRESSION: 1. Vascular findings and measurements pertinent to potential TAVR procedure, as detailed above. This patient does appear to have suitable pelvic arterial access bilaterally, preferable on the right side due to greater vessel diameter and lower degree of tortuosity. 2. Severe thickening calcification of the aortic valve, compatible with the reported clinical history of severe aortic stenosis. 3. Apparent filling defect in the tip of the left atrial appendage, concerning for potential thrombus. Attention at time of transesophageal echocardiogram is recommended  to exclude the possibility of left atrial appendage thrombus. 4. Morphologic changes in the liver suggestive of underlying cirrhosis. 5. Cholelithiasis without evidence of acute cholecystitis at this time. 6. Colonic diverticulosis without evidence of acute diverticulitis at this time. 7. Aberrant right subclavian artery (normal anatomical variant) incidentally noted. 8. Additional incidental findings, as above.   Electronically Signed   By: Vinnie Langton M.D.   On: 11/29/2016 13:36  STS Risk Calculator  Procedure                                          AVR + CABG  Risk of Mortality                                3.6% Morbidity or Mortality                       21.5% Prolonged LOS                                   12.8% Short LOS                                           20.5% Permanent Stroke                             2.0% Prolonged Vent Support                     17.1% DSW Infection                                     0.4% Renal Failure                                       6.4% Reoperation                                        7.7%  Impression:  This 81 year old woman has stage D, severe, symptomatic aortic stenosis with NYHA class II CHF symptoms of exertional shortness of breath and fatigue with moderate exertion. He symptoms have been progressing over the past 6 months and are now limiting her activity. She was recently admitted with an episode of chest pain that was somewhat atypical and she only had a 70% stenosis in a small diagonal on cath. I have personally reviewed her echo, cath and CT films. Echo shows a trileaflet aortic valve with severe calcification, thickening and restricted leaflet mobility. The mean gradient is 35 mm Hg with a peak velocity ratio of 0.27. There is moderate AI and she has atrial fibrillation which make assessment of the gradient more difficult. It certainly looks like a severely stenotic valve. Her cath shows no significant  coronary disease with only a 70% diagonal stenosis.  I agree that AVR is indicated in this patient for relief of her symptoms and to improve her quality of life and long term prognosis. Her risk for open surgical AVR would be moderately elevated due to her age and multiple co-morbid factors including chronic persistent atrial fib, multiple joint replacements, OSA. I think TAVR would be a reasonable alternative for her with a quicker recovery and less overall risk. Her gated cardiac CT shows anatomy favorable for a  26 mm Sapien 3 valve. Her abdominal and pelvic CT shows suitable arterial anatomy for a transfemoral approach.   The patient and her husband were counseled at length regarding treatment alternatives for management of severe symptomatic aortic stenosis. The risks and benefits of surgical intervention has been discussed in detail. Long-term prognosis with medical therapy was discussed. Alternative approaches such as conventional surgical aortic valve replacement, transcatheter aortic valve replacement, and palliative medical therapy were compared and contrasted at length. This discussion was placed in the context of the patient's own specific clinical presentation and past medical history. All of their questions been addressed. The patient is eager to proceed with TAVR as soon as possible.   Following the decision to proceed with transcatheter aortic valve replacement, a discussion was held regarding what types of management strategies would be attempted intraoperatively in the event of life-threatening complications, including whether or not the patient would be considered a candidate for the use of cardiopulmonary bypass and/or conversion to open sternotomy for attempted surgical intervention. The patient is aware of the fact that transient use of cardiopulmonary bypass may be necessary, but the patient specifically states that she would not wish to undergo redo median sternotomy under any  circumstances, even if she were to develop potentially lethal complications related to transcatheter valves appointment.  The patient has been advised of a variety of complications that might develop including but not limited to risks of death, stroke, paravalvular leak, aortic dissection or other major vascular complications, aortic annulus rupture, device embolization, cardiac rupture or perforation, mitral regurgitation, acute myocardial infarction, arrhythmia, heart block or bradycardia requiring permanent pacemaker placement, congestive heart failure, respiratory failure, renal failure, pneumonia, infection, other late complications related to structural valve deterioration or migration, or other complications that might ultimately cause a temporary or permanent loss of functional independence or other long term morbidity. The patient provides full informed consent for the procedure as described and all questions were answered.     Plan:  Transfemoral TAVR on 12/14/2016    Gaye Pollack, MD 12/01/2016

## 2016-12-08 ENCOUNTER — Other Ambulatory Visit: Payer: Self-pay | Admitting: *Deleted

## 2016-12-08 DIAGNOSIS — Z7901 Long term (current) use of anticoagulants: Secondary | ICD-10-CM

## 2016-12-08 MED ORDER — ENOXAPARIN SODIUM 80 MG/0.8ML ~~LOC~~ SOLN: 80.0000 mg | SUBCUTANEOUS | 0 refills | Status: DC

## 2016-12-10 ENCOUNTER — Encounter (HOSPITAL_COMMUNITY)
Admission: RE | Admit: 2016-12-10 | Discharge: 2016-12-10 | Disposition: A | Discharge status: Home / Self Care | Payer: Medicare Other | Source: Ambulatory Visit | Attending: Cardiovascular Disease | Admitting: Cardiovascular Disease

## 2016-12-10 ENCOUNTER — Encounter (HOSPITAL_COMMUNITY): Payer: Self-pay

## 2016-12-10 ENCOUNTER — Ambulatory Visit (HOSPITAL_COMMUNITY)
Admission: RE | Admit: 2016-12-10 | Discharge: 2016-12-10 | Disposition: A | Discharge status: Home / Self Care | Payer: Medicare Other | Source: Ambulatory Visit | Attending: Cardiovascular Disease | Admitting: Cardiovascular Disease

## 2016-12-10 DIAGNOSIS — I350 Nonrheumatic aortic (valve) stenosis: Secondary | ICD-10-CM | POA: Diagnosis not present

## 2016-12-10 DIAGNOSIS — Z96611 Presence of right artificial shoulder joint: Secondary | ICD-10-CM | POA: Insufficient documentation

## 2016-12-10 DIAGNOSIS — I700 Atherosclerosis of aorta: Secondary | ICD-10-CM | POA: Insufficient documentation

## 2016-12-10 HISTORY — DX: Cerebral infarction, unspecified: I63.9

## 2016-12-10 LAB — CBC
HCT: 42.2 % (ref 36.0–46.0)
HEMOGLOBIN: 13.9 g/dL (ref 12.0–15.0)
MCH: 30.8 pg (ref 26.0–34.0)
MCHC: 32.9 g/dL (ref 30.0–36.0)
MCV: 93.6 fL (ref 78.0–100.0)
PLATELETS: 232 10*3/uL (ref 150–400)
RBC: 4.51 MIL/uL (ref 3.87–5.11)
RDW: 13.2 % (ref 11.5–15.5)
WBC: 10.2 10*3/uL (ref 4.0–10.5)

## 2016-12-10 LAB — COMPREHENSIVE METABOLIC PANEL
ALK PHOS: 76 U/L (ref 38–126)
ALT: 18 U/L (ref 14–54)
ANION GAP: 10 (ref 5–15)
AST: 23 U/L (ref 15–41)
Albumin: 4.1 g/dL (ref 3.5–5.0)
BUN: 19 mg/dL (ref 6–20)
CALCIUM: 9.8 mg/dL (ref 8.9–10.3)
CO2: 23 mmol/L (ref 22–32)
CREATININE: 1.01 mg/dL — AB (ref 0.44–1.00)
Chloride: 108 mmol/L (ref 101–111)
GFR, EST NON AFRICAN AMERICAN: 52 mL/min — AB (ref >60)
Glucose, Bld: 101 mg/dL — ABNORMAL HIGH (ref 65–99)
Potassium: 3.6 mmol/L (ref 3.5–5.1)
SODIUM: 141 mmol/L (ref 135–145)
TOTAL PROTEIN: 7.3 g/dL (ref 6.5–8.1)
Total Bilirubin: 1.1 mg/dL (ref 0.3–1.2)

## 2016-12-10 LAB — URINALYSIS, ROUTINE W REFLEX MICROSCOPIC
BILIRUBIN URINE: NEGATIVE
GLUCOSE, UA: NEGATIVE mg/dL
HGB URINE DIPSTICK: NEGATIVE
KETONES UR: NEGATIVE mg/dL
Leukocytes, UA: NEGATIVE
Nitrite: NEGATIVE
PROTEIN: NEGATIVE mg/dL
Specific Gravity, Urine: 1.012 (ref 1.005–1.030)
pH: 5 (ref 5.0–8.0)

## 2016-12-10 LAB — PROTIME-INR
INR: 1.81
PROTHROMBIN TIME: 21.2 s — AB (ref 11.4–15.2)

## 2016-12-10 LAB — BLOOD GAS, ARTERIAL
Acid-Base Excess: 0.2 mmol/L (ref 0.0–2.0)
BICARBONATE: 23.6 mmol/L (ref 20.0–28.0)
Drawn by: 449841
O2 SAT: 97.4 %
PATIENT TEMPERATURE: 98.6
PCO2 ART: 34 mmHg (ref 32.0–48.0)
PO2 ART: 97.7 mmHg (ref 83.0–108.0)
pH, Arterial: 7.457 — ABNORMAL HIGH (ref 7.350–7.450)

## 2016-12-10 LAB — TYPE AND SCREEN
ABO/RH(D): B POS
ANTIBODY SCREEN: NEGATIVE

## 2016-12-10 LAB — SURGICAL PCR SCREEN
MRSA, PCR: NEGATIVE
STAPHYLOCOCCUS AUREUS: POSITIVE — AB

## 2016-12-10 LAB — APTT: APTT: 37 s — AB (ref 24–36)

## 2016-12-10 NOTE — Pre-Procedure Instructions (Addendum)
[name]  12/10/2016      [id number] - [place], [place] [address] AT [address]. [place] [address] Phone: [phone] Fax: [phone]  [facility], [place] - [address] AT [address] [place] [address] Phone: [phone] Fax: [phone]    Your procedure is scheduled on 12/14/16  Report to [facility] at 530 A.M.  Call this number if you have problems the morning of surgery:  [phone]   Remember:  Do not eat food or drink liquids after midnight.  Take these medicines the morning of surgery with A SIP OF WATER    Omeprazole, prednisone,tylenol if needed, nitrostat if needed  STOP all herbel meds, nsaids (aleve,naproxen,advil,ibuprofen)  Starting now including all vitamins/supplements(cal-vit D,glucosamine-chondrotin, melatonion)  Xarelto,aspirin per dr   Lazaro Arms not wear jewelry, make-up or nail polish.  Do not wear lotions, powders, or perfumes, or deoderant.  Do not shave 48 hours prior to surgery.  Men may shave face and neck.  Do not bring valuables to the hospital.  [facility] is not responsible for any belongings or valuables.  Contacts, dentures or bridgework may not be worn into surgery.  Leave your suitcase in the car.  After surgery it may be brought to your room.  For patients admitted to the hospital, discharge time will be determined by your treatment team.  Patients discharged the day of surgery will not be allowed to drive home.   Special instructions  Special Instructions: [place] - Preparing for Surgery  Before surgery, you can play an important role.  Because skin is not sterile, your skin needs to be as free of germs as possible.  You can reduce the number of germs on you skin by washing with CHG (chlorahexidine gluconate) soap before surgery.  CHG is an antiseptic cleaner which kills  germs and bonds with the skin to continue killing germs even after washing.  Please DO NOT use if you have an allergy to CHG or antibacterial soaps.  If your skin becomes reddened/irritated stop using the CHG and inform your nurse when you arrive at Short Stay.  Do not shave (including legs and underarms) for at least 48 hours prior to the first CHG shower.  You may shave your face.  Please follow these instructions carefully:   1.  Shower with CHG Soap the night before surgery and the morning of Surgery.  2.  If you choose to wash your hair, wash your hair first as usual with your normal shampoo.  3.  After you shampoo, rinse your hair and body thoroughly to remove the Shampoo.  4.  Use CHG as you would any other liquid soap.  You can apply chg directly  to the skin and wash gently with scrungie or a clean washcloth.  5.  Apply the CHG Soap to your body ONLY FROM THE NECK DOWN.  Do not use on open wounds or open sores.  Avoid contact with your eyes ears, mouth and genitals (private parts).  Wash genitals (private parts)       with your normal soap.  6.  Wash thoroughly, paying special attention to the area where your surgery will be performed.  7.  Thoroughly rinse your body with warm water from the neck down.  8.  DO NOT shower/wash with your normal soap after using and rinsing off  the CHG Soap.  9.  Pat yourself dry with a clean towel.            10.  Wear clean pajamas.            11.  Place clean sheets on your bed the night of your first shower and do not sleep with pets.  Day of Surgery  Do not apply any lotions/deodorants the morning of surgery.  Please wear clean clothes to the hospital/surgery center.  Please read over the  fact sheets that you were given.

## 2016-12-11 LAB — HEMOGLOBIN A1C
HEMOGLOBIN A1C: 5.6 % (ref 4.8–5.6)
Mean Plasma Glucose: 114 mg/dL

## 2016-12-13 MED ORDER — POTASSIUM CHLORIDE 2 MEQ/ML IV SOLN
80.0000 meq | INTRAVENOUS | Status: DC
  Filled 2016-12-13: qty 40

## 2016-12-13 MED ORDER — NOREPINEPHRINE BITARTRATE 1 MG/ML IV SOLN
0.0000 ug/min | INTRAVENOUS | Status: DC
  Filled 2016-12-13: qty 4

## 2016-12-13 MED ORDER — SODIUM CHLORIDE 0.9 % IV SOLN
INTRAVENOUS | Status: DC
  Filled 2016-12-13: qty 30

## 2016-12-13 MED ORDER — DEXMEDETOMIDINE HCL IN NACL 400 MCG/100ML IV SOLN
0.1000 ug/kg/h | INTRAVENOUS | Status: DC
Start: 2016-12-14 — End: 2016-12-14
  Filled 2016-12-13: qty 100

## 2016-12-13 MED ORDER — MAGNESIUM SULFATE 50 % IJ SOLN
40.0000 meq | INTRAMUSCULAR | Status: DC
  Filled 2016-12-13: qty 10

## 2016-12-13 MED ORDER — EPINEPHRINE PF 1 MG/ML IJ SOLN
0.0000 ug/min | INTRAVENOUS | Status: DC
  Filled 2016-12-13: qty 4

## 2016-12-13 MED ORDER — SODIUM CHLORIDE 0.9 % IV SOLN
INTRAVENOUS | Status: DC
Start: 2016-12-14 — End: 2016-12-14

## 2016-12-13 MED ORDER — VANCOMYCIN HCL 10 G IV SOLR
1250.0000 mg | INTRAVENOUS | Status: AC
  Administered 2016-12-14: 1250 mg via INTRAVENOUS
  Filled 2016-12-13: qty 1250

## 2016-12-13 MED ORDER — CHLORHEXIDINE GLUCONATE 0.12 % MT SOLN: 15.0000 mL | Freq: Once | OROMUCOSAL | Status: DC

## 2016-12-13 MED ORDER — NITROGLYCERIN IN D5W 200-5 MCG/ML-% IV SOLN
2.0000 ug/min | INTRAVENOUS | Status: DC
  Filled 2016-12-13: qty 250

## 2016-12-13 MED ORDER — DOPAMINE-DEXTROSE 3.2-5 MG/ML-% IV SOLN
0.0000 ug/kg/min | INTRAVENOUS | Status: DC
  Filled 2016-12-13: qty 250

## 2016-12-13 MED ORDER — CEFUROXIME SODIUM 1.5 G IJ SOLR
1.5000 g | INTRAMUSCULAR | Status: AC
  Administered 2016-12-14: 1.5 g via INTRAVENOUS
  Filled 2016-12-13: qty 1.5

## 2016-12-13 MED ORDER — SODIUM CHLORIDE 0.9 % IV SOLN
INTRAVENOUS | Status: DC
  Filled 2016-12-13: qty 2.5

## 2016-12-13 MED ORDER — SODIUM CHLORIDE 0.9 % IV SOLN
30.0000 ug/min | INTRAVENOUS | Status: DC
  Filled 2016-12-13: qty 2

## 2016-12-13 NOTE — Anesthesia Preprocedure Evaluation (Addendum)
Anesthesia Evaluation  Patient identified by MRN, date of birth, ID band Patient awake    Reviewed: Allergy & Precautions, NPO status   History of Anesthesia Complications Negative for: history of anesthetic complications  Airway Mallampati: II  TM Distance: >3 FB Neck ROM: Full    Dental  (+) Dental Advisory Given, Caps   Pulmonary sleep apnea and Continuous Positive Airway Pressure Ventilation , former smoker,    Pulmonary exam normal        Cardiovascular hypertension, Pt. on medications (-) angina+ CAD (small D1 70%, 30% RCA)  + dysrhythmias Atrial Fibrillation  Rhythm:Irregular Rate:Bradycardia + Systolic murmurs 0000000 ECHO: EF 60-65%, severe AS with mod AI, AVA 0.75 mmHg, mild MR   Neuro/Psych    GI/Hepatic Neg liver ROS, GERD  Medicated and Controlled,  Endo/Other  negative endocrine ROS  Renal/GU negative Renal ROS     Musculoskeletal  (+) Arthritis , steroids,  Fibromyalgia -  Abdominal   Peds  Hematology Xarelto   Anesthesia Other Findings   Reproductive/Obstetrics                            Anesthesia Physical Anesthesia Plan  ASA: III  Anesthesia Plan: General   Post-op Pain Management:    Induction: Intravenous  Airway Management Planned: Oral ETT  Additional Equipment: Arterial line, CVP, 3D TEE and Ultrasound Guidance Line Placement  Intra-op Plan:   Post-operative Plan: Extubation in OR  Informed Consent: I have reviewed the patients History and Physical, chart, labs and discussed the procedure including the risks, benefits and alternatives for the proposed anesthesia with the patient or authorized representative who has indicated his/her understanding and acceptance.   Dental advisory given  Plan Discussed with: CRNA and Surgeon  Anesthesia Plan Comments: (Plan routine monitors, A line, CVP, GETA with TEE )        Anesthesia Quick Evaluation

## 2016-12-13 NOTE — H&P (Signed)
[address]       [place] [address]             [phone]      Cardiothoracic Surgery Admission History and Physical   Referring Provider is Dorothy Spark, MD PCP is Jonathon Bellows, MD      Chief Complaint  Patient presents with  . Aortic Stenosis        HPI:  The patient is a 81 year old woman with a history of aortic stenosis, persistent atrial fibrillation on Xarelto, hypertension, OSA on CPAP, chronic diastolic heart failure and degenerative arthritis s/p bilateral hip replacements, left total knee replacement and right total shoulder replacement. She has been followed with serial echocardiograms by Dr. Radford Pax and an echo in 10/2015 showed moderate AS with a mean gradient of 32 mm Hg. Over the past several months she has developed progressive exertional shortness of breath. This does not occur with normal activities in her house but walking fast will bring it on and she has to stop to rest. She had not had any chest pain or pressure and no dizziness or syncope. Then on 11/17/2016 when she developed sudden onset of chest discomfort. . She took a sublingual nitroglycerin with some relief but the pain did not resolve completely and it recurred in the early morning hours of 11/18/2016. She went to the ER where symptoms were again relieved by administration of nitroglycerin and morphine. Baseline EKG showed rate controlled atrial fibrillation without significant ST segment changes. Initial troponin was negative and follow-up troponin  was slightly elevated to 0.06. The patient wasadmitted to the hospital and has remained pain-free ever since. Transthoracic echocardiogram on 11/18/2016 showed severe aortic stenosis with a mean gradient of 35 mm Hg, a peak of 57 mm Hg and a DVI of 0.27. There was moderate aortic insufficiency, mild mitral regurgitation and normal left ventricular systolic function with moderate left ventricular hypertrophy. Left and right  heart catheterization was performed 11/19/2016 showing severe aortic stenosis with peak to peak and mean transvalvular gradients of 29 and 21.7 mmHg respectively, corresponding to an aortic valve area calculated 0.8 cm. The patient was noted to have 70% focal ostial stenosis of the first diagonal branch, 30-40% stenosis of the proximal right coronary artery, and otherwise very mild nonobstructive coronary artery disease. There was mild pulmonary hypertension.   She is here with her husband today. They are from [place] and he worked as a Hydrologist for [employer]. 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 She has not been able to play lately due to exertional fatigue and shortness of breath.      Past Medical History:  Diagnosis Date  . Anginal pain ([place])   . Aortic stenosis   . Arthritis   . Atrial fibrillation, long-standing persistent ([place])   . Cancer [facility])    left breast mastectomy(cancer)-surgery only  . Chest pain 11/18/2016  . Chronic anticoagulation   . Chronic atrial fibrillation ([facility]) CARDIOLOGIST -- DR Tressia Miners TURNER   controlled  . Chronic diastolic congestive heart failure ([place])   . Complication of anesthesia    extremely claustophobic(only occurs with narcotic medications as stated per pt)  . Diverticulosis   . Dysrhythmia   . Fibromyalgia   . GERD (gastroesophageal reflux disease)   . Hard of hearing    wears hearing aides   . Heart murmur    mild to mod AS, mild to mod.  AR and MR, mild  TR per echo 02-25-2014  . History of breast cancer no recurrence   dx 2004   s/p  left breast mastectomy w/ node dissection/   no chemo or radiation  . History of nuclear stress test    Myoview 1/17: EF 54%, apical thinning, no ischemia, low risk  . History of TIA (transient ischemic attack)    per scan--  no residual  . Hyperlipidemia   . Hypertension   . Nocturia   . OSA on CPAP     severe/  AHI 34/hr  . Short of breath on exertion      01-16-15 not a problem now.  . Urge urinary incontinence   . Wears glasses          Past Surgical History:  Procedure Laterality Date  . ABDOMINOPLASTY  1987  . APPENDECTOMY  1965  . BREAST SURGERY    . CARDIAC CATHETERIZATION N/A 11/19/2016   Procedure: Right/Left Heart Cath and Coronary Angiography;  Surgeon: Troy Sine, MD;  Location: [facility];  Service: Cardiovascular;  Laterality: N/A;  . CARPAL TUNNEL RELEASE Right 07-08-2004  . COLONOSCOPY WITH ESOPHAGOGASTRODUODENOSCOPY (EGD)  2006  . EXPLANTATION BREAST IMPLANT AND DEBRIDEMENT  09-15-2005  . INTERSTIM IMPLANT PLACEMENT N/A 08/22/2014   Procedure: Barrie Lyme IMPLANT STAGE ONE/TWO;  Surgeon: Reece Packer, MD;  Location: [facility];  Service: Urology;  Laterality: N/A;  . MASTECTOMY    . MASTECTOMY, MODIFIED RADICAL W/RECONSTRUCTION  09-16-2003   left breast W/ SLN DISSECTION (pt states had 13 operations for implant infection   . REPLACE TISSUE EXPANDER LEFT BREAST AND TOTAL CAPSULECTOMY  08-05-2006  . REVISION BREAST RECONSTRUCTION Left 11-11-2004   12-03-2004  DRAINAGE SEREMA LEFT CHEST  . TOTAL HIP ARTHROPLASTY Left 07/17/2013   Procedure: LEFT TOTAL HIP ARTHROPLASTY ANTERIOR APPROACH;  Surgeon: Mauri Pole, MD;  Location: [facility];  Service: Orthopedics;  Laterality: Left;  . TOTAL HIP ARTHROPLASTY Right 01/28/2015   Procedure: RIGHT TOTAL HIP ARTHROPLASTY ANTERIOR APPROACH;  Surgeon: Paralee Cancel, MD;  Location: [facility];  Service: Orthopedics;  Laterality: Right;  . TOTAL KNEE ARTHROPLASTY Left 11/11/2015   Procedure: LEFT TOTAL KNEE ARTHROPLASTY;  Surgeon: Paralee Cancel, MD;  Location: [facility];  Service: Orthopedics;  Laterality: Left;  . TOTAL SHOULDER ARTHROPLASTY  08/11/2012   Procedure: TOTAL SHOULDER ARTHROPLASTY;  Surgeon: Augustin Schooling, MD;  Location: [facility];  Service: Orthopedics;  Laterality: Right;  RIGHT  TOTAL SHOULDER  ARTHROPLASTY   . TRANSTHORACIC  ECHOCARDIOGRAM  02-25-2014   moderate LVH/  ef 55-60%/  mod. to sev. calcification AV with mild to moderate AV stenosis/  mild to mod. AR and MR/ mild PR/   moderate to severe LAE  . TUBAL LIGATION  1973         Family History  Problem Relation Age of Onset  . Heart disease Mother   . Heart disease Father     Social History        Social History  . Marital status: Married    Spouse name: N/A  . Number of children: N/A  . Years of education: N/A      Occupational History  . Not on file.        Social History Main Topics  . Smoking status: Former Smoker    Packs/day: 0.25    Years: 8.00    Types: Cigarettes    Quit date: 10/18/1966  . Smokeless tobacco: Never Used  .  Alcohol use No  . Drug use: No  . Sexual activity: Yes       Other Topics Concern  . Not on file      Social History Narrative  . No narrative on file          Current Outpatient Prescriptions  Medication Sig Dispense Refill  . acetaminophen (TYLENOL) 325 MG tablet Take 1-2 tablets (325-650 mg total) by mouth every 4 (four) hours as needed for mild pain or moderate pain.    Marland Kitchen amoxicillin (AMOXIL) 500 MG capsule TAKE 4 CAPSULES BY MOUTH 1 HOUR BEFORE DENTAL APPT FOR PREMED  0  . atorvastatin (LIPITOR) 40 MG tablet Take 20 mg by mouth daily.     . Calcium Carbonate-Vitamin D (CALCIUM-VITAMIN D) 500-200 MG-UNIT tablet Take 1 tablet by mouth daily.    . clobetasol ointment (TEMOVATE) [id number] % Apply 1 application topically 2 (two) times daily as needed (for skin irritation).    Marland Kitchen diltiazem (TIAZAC) 360 MG 24 hr capsule Take 360 mg by mouth every evening.    . furosemide (LASIX) 20 MG tablet TAKE 1 TABLET (20 MG TOTAL) BY MOUTH DAILY. 90 tablet 1  . glucosamine-chondroitin 500-400 MG tablet Take 1 tablet by mouth 2 (two) times daily.    Marland Kitchen losartan (COZAAR) 50 MG tablet Take 50 mg by mouth daily.    . Melatonin 10 MG TABS Take 1 tablet by mouth at bedtime.     .  nitroGLYCERIN (NITROSTAT) 0.4 MG SL tablet Place 0.4 mg under the tongue every 5 (five) minutes as needed. For chest pain    . omeprazole (PRILOSEC) 20 MG capsule Take 20 mg by mouth every morning.     . polycarbophil (FIBER-CAPS) 625 MG tablet Take 625 mg by mouth daily.    . predniSONE (DELTASONE) 5 MG tablet Take 5 mg by mouth daily with breakfast.    . rivaroxaban (XARELTO) 20 MG TABS tablet Take 20 mg by mouth daily with supper.    . triamcinolone cream (KENALOG) 0.1 % Apply 1 application topically as needed (for skin irritation).     No current facility-administered medications for this visit.          Allergies  Allergen Reactions  . Beta Adrenergic Blockers Shortness Of Breath  . Betapace [Sotalol Hcl] Shortness Of Breath  . Other     Narcotic pain medications - anxiety attacks  . Oxycodone Shortness Of Breath    Tolerates Dilaudid, Fentanyl  . Crestor [Rosuvastatin Calcium] Other (See Comments)    Cough, leg cramps  . Adhesive [Tape] Rash    Adhesive bandaids   . Clonidine Derivatives Palpitations    bradycardia  . Latex Rash    Per patient  . Tramadol Anxiety      Review of Systems:  General:normalappetite, decreasedenergy, noweight gain, noweight loss, nofever Cardiac:nochest pain with exertion, + single episodechest pain at rest, + SOB with exertion, noresting SOB, noPND, noorthopnea, nopalpitations, +arrhythmia, +atrial fibrillation, noLE edema, nodizzy spells, nosyncope Respiratory:+ exertionalshortness of breath, nohome oxygen, noproductive cough, nodry cough, nobronchitis, nowheezing, nohemoptysis, noasthma, nopain with inspiration or cough, +sleep apnea, +CPAP at night [id number] swallowing, noreflux, nofrequent heartburn, nohiatal hernia, noabdominal pain,  noconstipation, nodiarrhea, nohematochezia, nohematemesis, nomelena [id number], nofrequency, nourinary tract infection, nohematuria, nokidney stones, nokidney disease Vascular:nopain suggestive of claudication, nopain in feet, noleg cramps, novaricose veins, noDVT, nonon-healing foot ulcer Neuro:nostroke, noTIA's, noseizures, noheadaches, notemporary blindness one eye, noslurred speech, noperipheral neuropathy, nochronic pain, noinstability of gait, nomemory/cognitive dysfunction Musculoskeletal:noarthritis, nojoint swelling, nomyalgias,  nodifficulty walking, normalmobility  Skin:norash, noitching, noskin infections, nopressure sores or ulcerations Psych:noanxiety, nodepression, nonervousness, nounusual recent stress Eyes:noblurry vision, nofloaters, norecent vision changes, +wears glasses or contacts [id number] loss, noloose or painful teeth, nodentures, last saw dentist within the past year Hematologic:noeasy bruising, noabnormal bleeding, noclotting disorder, nofrequent epistaxis Endocrine:nodiabetes, does not check CBG's at home                                          Physical Exam:              BP 126/69 (BP Location: Right Arm, Patient Position: Sitting, Cuff Size: Large)   Pulse 64   Resp 16   Ht 5\' 7"  (1.702 m)   Wt 173 lb (78.5 kg)   SpO2 98% Comment: RA  BMI 27.10 kg/m              General:                      Elderly but  well-appearing             HEENT:                       Unremarkable , NCAT , PERLA, EOMI, oropharynx clear. Teeth in good condition             Neck:                            no JVD, no bruits, no adenopathy or thyromegaly             Chest:                          clear to auscultation, symmetrical breath sounds, no wheezes, no rhonchi              CV:                              RRR, grade III/VI crescendo/decrescendo murmur heard best at RSB,  no diastolic murmur             Abdomen:                    soft, non-tender, no masses or organomegaly             Extremities:                 warm, well-perfused, pulses diminished in feet, no LE edema             Rectal/GU                   Deferred             Neuro:                         Grossly non-focal and symmetrical throughout             Skin:                            Clean and dry, no rashes, no breakdown   Diagnostic Tests:  *[facility]* [facility]  [address], [place] [address] [phone]  ------------------------------------------------------------------- Transthoracic Echocardiography  Patient: Valeria, Grice MR #: [id number] Study Date: 11/18/2016 Gender: F Age: 59 Height: 170.2 cm Weight: 78.5 kg BSA: 1.94 m^2 Pt. Status: Room: 2W13C  SONOGRAPHER Tresa Res, RDCS Laverda Page, [place] REFERRING [facility] [place] M ATTENDING Ward, Delice Bison PERFORMING [facility], Inpatient ADMITTING Myrene Buddy P  cc:  ------------------------------------------------------------------- LV EF: 60% - 65%  ------------------------------------------------------------------- Indications: Chest pain 786.51.  ------------------------------------------------------------------- History: PMH: Congestive heart failure. Aortic valve disease. Transient ischemic attack. Risk factors: Former tobacco  use. Hypertension.  ------------------------------------------------------------------- Study Conclusions  - Left ventricle: The cavity size was normal. Wall thickness was increased in a pattern of moderate LVH. Systolic function was normal. The estimated ejection fraction was in the range of 60% to 65%. Wall motion was normal; there were no regional wall motion abnormalities. Doppler parameters are consistent with high ventricular filling pressure. - Aortic valve: Valve mobility was restricted. There was moderate to severe stenosis. There was moderate regurgitation. Valve area (VTI): 0.75 cm^2. Valve area (Vmax): 0.77 cm^2. Valve area (Vmean): 0.69 cm^2. - Mitral valve: Calcified annulus. Mildly thickened leaflets . There was mild regurgitation. Valve area by continuity equation (using LVOT flow): 2.42 cm^2. - Left atrium: The atrium was severely dilated. - Right ventricle: The cavity size was mildly dilated. - Right atrium: The atrium was severely dilated. - Pulmonary arteries: Systolic pressure was moderately increased. PA peak pressure: 50 mm Hg (S).  Impressions:  - Normal LV systolic function; moderate LVH; elevated LV filling pressure; biatrial enlargement; mild RVE; calcified aortic valve with moderate to severe AS and moderate AI; mild MR and TR; moderately elevated pulmonary pressure.  ------------------------------------------------------------------- Study data: Comparison was made to the study of 11/06/2015. Study status: Routine. Study completion: There were no complications. Transthoracic echocardiography. M-mode, complete 2D, spectral Doppler, and color Doppler. Birthdate: Patient birthdate: 12/08/1935. Age: Patient is 81 yr old. Sex: Gender: female. BMI: 27.1 kg/m^2. Blood pressure: 153/46 Patient status: Inpatient. Study date: Study date: 11/18/2016. Study time: 10:07 AM. Location:  Bedside.  -------------------------------------------------------------------  ------------------------------------------------------------------- Left ventricle: The cavity size was normal. Wall thickness was increased in a pattern of moderate LVH. Systolic function was normal. The estimated ejection fraction was in the range of 60% to 65%. Wall motion was normal; there were no regional wall motion abnormalities. The study was not technically sufficient to allow evaluation of LV diastolic dysfunction due to atrial fibrillation. Doppler parameters are consistent with high ventricular filling pressure.  ------------------------------------------------------------------- Aortic valve: Trileaflet; moderately calcified leaflets. Valve mobility was restricted. Doppler: There was moderate to severe stenosis. There was moderate regurgitation. VTI ratio of LVOT to aortic valve: 0.26. Valve area (VTI): 0.75 cm^2. Indexed valve area (VTI): 0.39 cm^2/m^2. Peak velocity ratio of LVOT to aortic valve: 0.27. Valve area (Vmax): 0.77 cm^2. Indexed valve area (Vmax): 0.4 cm^2/m^2. Mean velocity ratio of LVOT to aortic valve: 0.24. Valve area (Vmean): 0.69 cm^2. Indexed valve area (Vmean): 0.36 cm^2/m^2. Mean gradient (S): 35 mm Hg. Peak gradient (S): 57 mm Hg.  ------------------------------------------------------------------- Aorta: Aortic root: The aortic root was normal in size.  ------------------------------------------------------------------- Mitral valve: Calcified annulus. Mildly thickened leaflets . Mobility was not restricted. Doppler: Transvalvular velocity was within the normal range. There was no evidence for stenosis. There was mild regurgitation. Valve area by pressure half-time: 3.61 cm^2. Indexed valve area by pressure half-time: 1.86 cm^2/m^2. Valve area by continuity equation (using LVOT flow): 2.42 cm^2. Indexed valve area by continuity  equation  (using LVOT flow): 1.25 cm^2/m^2. Mean gradient (D): 2 mm Hg. Peak gradient (D): 7 mm Hg.  ------------------------------------------------------------------- Left atrium: The atrium was severely dilated.  ------------------------------------------------------------------- Right ventricle: The cavity size was mildly dilated. Systolic function was normal.  ------------------------------------------------------------------- Pulmonic valve: Doppler: Transvalvular velocity was within the normal range. There was no evidence for stenosis. There was mild regurgitation.  ------------------------------------------------------------------- Tricuspid valve: Structurally normal valve. Doppler: Transvalvular velocity was within the normal range. There was mild regurgitation.  ------------------------------------------------------------------- Pulmonary artery: Systolic pressure was moderately increased.  ------------------------------------------------------------------- Right atrium: The atrium was severely dilated.  ------------------------------------------------------------------- Pericardium: There was no pericardial effusion.  ------------------------------------------------------------------- Measurements  Left ventricle Value Reference LV ID, ED, PLAX chordal 43.4 mm 43 - 52 LV ID, ES, PLAX chordal 29 mm 23 - 38 LV fx shortening, PLAX chordal 33 % >=29 LV PW thickness, ED 14.3 mm ---------- IVS/LV PW ratio, ED 1.01 <=1.3 Stroke volume, 2D 74 ml ---------- Stroke volume/bsa, 2D 38 ml/m^2 ---------- LV e&', lateral 10.8 cm/s ---------- LV E/e&', lateral 12.04  ---------- LV e&', medial 6.26 cm/s ---------- LV E/e&', medial 20.77 ---------- LV e&', average 8.53 cm/s ---------- LV E/e&', average 15.24 ----------  Ventricular septum Value Reference IVS thickness, ED 14.4 mm ----------  LVOT Value Reference LVOT ID, S 19 mm ---------- LVOT area 2.84 cm^2 ---------- LVOT peak velocity, S 102 cm/s ---------- LVOT mean velocity, S 67.3 cm/s ---------- LVOT VTI, S 26 cm ----------  Aortic valve Value Reference Aortic valve peak velocity, S 378 cm/s ---------- Aortic valve mean velocity, S 277 cm/s ---------- Aortic valve VTI, S 99.1 cm ---------- Aortic mean gradient, S 35 mm Hg ---------- Aortic peak gradient, S 57 mm Hg ---------- VTI ratio, LVOT/AV 0.26 ---------- Aortic valve area, VTI 0.75 cm^2 ---------- Aortic valve area/bsa, VTI 0.39 cm^2/m^2 ---------- Velocity ratio, peak, LVOT/AV 0.27 ---------- Aortic valve area, peak velocity 0.77 cm^2 ---------- Aortic valve area/bsa, peak 0.4 cm^2/m^2 ---------- velocity Velocity ratio, mean, LVOT/AV 0.24 ---------- Aortic valve area, mean velocity 0.69 cm^2 ---------- Aortic valve area/bsa, mean 0.36 cm^2/m^2 ---------- velocity Aortic regurg pressure half-time  677 ms ----------  Aorta Value Reference Aortic root ID, ED 28 mm ----------  Left atrium Value Reference LA ID, A-P, ES 54 mm ---------- LA ID/bsa, A-P (H) 2.78 cm/m^2 <=2.2 LA volume, S 180 ml ---------- LA volume/bsa, S 92.7 ml/m^2 ---------- LA volume, ES, 1-p A4C 203 ml ---------- LA volume/bsa, ES, 1-p A4C 104.5 ml/m^2 ---------- LA volume, ES, 1-p A2C 152 ml ---------- LA volume/bsa, ES, 1-p A2C 78.2 ml/m^2 ----------  Mitral valve Value Reference Mitral E-wave peak velocity 130 cm/s ---------- Mitral mean velocity, D 61.2 cm/s ---------- Mitral deceleration time 165 ms 150 - 230 Mitral pressure half-time 57 ms ---------- Mitral mean gradient, D 2 mm Hg ---------- Mitral peak gradient, D 7 mm Hg ---------- Mitral valve area, PHT, DP 3.61 cm^2 ---------- Mitral valve area/bsa, PHT, DP 1.86 cm^2/m^2 ---------- Mitral valve area, LVOT 2.42 cm^2 ---------- continuity Mitral valve area/bsa, LVOT 1.25 cm^2/m^2 ---------- continuity Mitral annulus VTI, D 30.5 cm ----------  Pulmonary arteries Value Reference PA pressure, S, DP (H) 50 mm Hg <=30  Tricuspid valve Value Reference Tricuspid regurg peak velocity 323 cm/s ---------- Tricuspid peak RV-RA  gradient 42 mm Hg ----------  Right atrium Value Reference RA ID, S-I, ES, A4C (H) 68.8 mm 34 - 49 RA area, ES, A4C (H) 27.6 cm^2 8.3 - 19.5 RA volume, ES, A/L 99.2 ml ---------- RA volume/bsa, ES, A/L 51.1  ml/m^2 ----------  Systemic veins Value Reference Estimated CVP 8 mm Hg ----------  Right ventricle Value Reference RV pressure, S, DP (H) 50 mm Hg <=30 RV s&', lateral, S 10.4 cm/s ----------  Legend: (L) and (H) mark values outside specified reference range.  ------------------------------------------------------------------- Prepared and Electronically Authenticated by  Kirk Ruths 2018-02-01T12:45:08  [name]  Cardiac catheterization  Order# [id number]  Reading physician: Troy Sine, MD Ordering physician: Troy Sine, MD Study date: 11/19/16  Physicians   Panel Physicians Referring Physician Case Authorizing Physician  Troy Sine, MD (Primary)    Procedures   Right/Left Heart Cath and Coronary Angiography  Conclusion     Ost RCA lesion, 35 %stenosed.  Ost 1st Diag lesion, 70 %stenosed.  The left ventricular systolic function is normal.  LV end diastolic pressure is normal.  There is severe aortic valve stenosis.  Normal LV function with at least 1+ MR.  Mild right heart pressure elevation with mild pulmonary hypertension.  Moderately severe aortic valve stenosis with a peak gradient of 29 and mean gradient of 21.7. Aortic valve area calculated to 0.8 cm2.   Mild coronary obstructive disease with 70% focal eccentric ostial narrowing of the first diagonal branch of the LAD and superior takeoff dominant  RCA with mild 30-40% very proximal stenosis eccentrically. Normal ramus intermediate and left circumflex coronary arteries.  Mild calcification of the ascending aorta. Supravalvular aortography was not performed to assess for aortic regurgitation.  RECOMMENDATION: Consider surgical evaluation for TAVR     Indications   Nonrheumatic aortic valve stenosis [I35.0 (ICD-10-CM)]  Shortness of breath [R06.02 (ICD-10-CM)]  Chest pain, unspecified type [R07.9 (ICD-10-CM)]  Procedural Details/Technique   Technical Details Mrs. Danesha Holl is a 81 year old female who is originally from [place]. She has a history of permanent atrial fibrillation on anticoagulation, chronic diastolic heart failure, hypertension, obesity, and has been found to have moderately severe aortic stenosis. She has noticed increasing shortness of breath. An echo Doppler study showed normal LV systolic function; moderate LVH; elevated LV filling pressure; biatrial enlargement; mild RVE; calcified aortic valve with moderate to severe AS and moderate AI; mild MR and TR; and moderately elevated pulmonary pressure. She is referred for right and left heart cardiac catheterization.  The patient was brought to the second floor [facility] cath lab in the fasting state. The right groin was prepped and draped in sterile fashion and a 5 [place] arterial sheath and 7 French venous sheath were inserted without difficulty. A Swan-Ganz catheter was advanced into the venous sheath and pressures were obtained in the right atrium, right ventricle, pulmonary artery, and pulmonary capillary wedge position. Cardiac outputs were obtained by the thermodilution and assumed Fick methods. Oxygen saturation was obtained in the pulmonary artery and aorta. A pigtail catheter was inserted and simultaneous AO/PA pressures were recorded. The pigtail catheter was advanced into the left ventricle and simultaneous left ventricular and PCW pressures  were recorded. Left ventriculography was performed in the RAO projection. A left ventricle to aorta pullback was performed. The pigtail catheter was then removed and diagnostic catheterization to delineate the coronary anatomy was performed utilizing 5 French Judkins 4 left and right and AR2 diagnostic catheters. All catheters were removed and the patient. Hemostasis was obtained by direct manual pressure. The patient tolerated the procedure well and returned to his room in satisfactory condition.    Estimated blood loss <50 mL.  During this procedure the patient was administered the following to achieve and maintain  moderate conscious sedation: Versed 2 mg, Fentanyl 50 mcg, while the patient's heart rate, blood pressure, and oxygen saturation were continuously monitored. The period of conscious sedation was 59 minutes, of which I was present face-to-face 100% of this time.    Coronary Findings   Dominance: Right  Left Main  Vessel was injected. Vessel is normal in caliber. Vessel is angiographically normal.  Left Anterior Descending  The LAD was a moderate size vessel that gave rise to a proximal diagonal vessel. There was 70% eccentric ostial narrowing in this diagonal vessel. Remainder of the LAD was angiographically normal and did not reach the LV apex.  First Diagonal Branch  Ost 1st Diag lesion, 70% stenosed. The lesion is eccentric.  Ramus Intermedius  Vessel was injected. Vessel is normal in caliber. Vessel is angiographically normal.  Left Circumflex  Vessel was injected. Vessel is normal in caliber. Vessel is angiographically normal.  Right Coronary Artery  The RCA was a dominant vessel that had a superior takeoff. There was mild calcification proximally with 30-40% eccentric narrowing; the RCA. A gave rise to a large PDA and PLA vessels which extended to the apex.  Ost RCA lesion, 35% stenosed.  Right Heart   Right Heart Pressures RA: Mean 7 RV: 42/5 PA: 42/23; mean 31 PW:  Mean 23 and sees the patient was in AF and the catheter seemed to intermittently bounce in and out of the wedge position.  AO: 170/72 PA: 46/17  LV: 206/12 PW: 27 ( again, the catheter seemed to intermittently go in and out of the wedge position, and this may conrtributing to elevation)  LV: 208/13 AO: 179/87  Oxygen saturation in the aorta is 93% in the pulmonary artery is 65%.  Cardiac by the thermodilution method is 4.0 and by the Fick method is 5.1 L/m. Cardiac index is 2.1 (T) and 2.7 L/m/m (F), respectively    Wall Motion              Left Heart   Left Ventricle The left ventricular size is normal. The left ventricular systolic function is normal. LV end diastolic pressure is normal. No regional wall motion abnormalities.    Aortic Valve There is severe aortic valve stenosis. The aortic valve is calcified. There is abnormal aortic valve motion. Aortic valve peak to peak gradient was 29 mm with a mean gradient of 21.7 mm. Calculated aortic valve area was 0.8 cm. This is consistent with moderately severe aortic stenosis.    Aorta Aortic Root: Aortic root appeared upper normal in size. There was calcification in the wall of the ascending aorta distal to the aortic arch.    Coronary Diagrams   Diagnostic Diagram     Implants        No implant documentation for this case.  PACS Images   Show images for Cardiac catheterization   Link to Procedure Log   Procedure Log    Hemo Data   Flowsheet Row Most Recent Value  Fick Cardiac Output 5.13 L/min  Fick Cardiac Output Index 2.69 (L/min)/BSA  Thermal Cardiac Output 4.03 L/min  Thermal Cardiac Output Index 2.11 (L/min)/BSA  Aortic Mean Gradient 21.7 mmHg  Aortic Peak Gradient 29 mmHg  Aortic Valve Area 0.80  Aortic Value Area Index 0.42 cm2/BSA  RA A Wave 7 mmHg  RA V Wave 9 mmHg  RA Mean 7 mmHg  RV Systolic Pressure 42 mmHg  RV Diastolic Pressure 1 mmHg  RV EDP 5 mmHg  PA Systolic Pressure 44  mmHg  PA Diastolic Pressure 20 mmHg  PA Mean 31 mmHg  PW A Wave 27 mmHg  PW V Wave 34 mmHg  PW Mean 29 mmHg  AO Systolic Pressure 0000000 mmHg  AO Diastolic Pressure 91 mmHg  AO Mean 0000000 mmHg  LV Systolic Pressure 99991111 mmHg  LV Diastolic Pressure 9 mmHg  LV EDP 12 mmHg  Arterial Occlusion Pressure Extended Systolic Pressure 0000000 mmHg  Arterial Occlusion Pressure Extended Diastolic Pressure 87 mmHg  Arterial Occlusion Pressure Extended Mean Pressure 126 mmHg  Left Ventricular Apex Extended Systolic Pressure 123XX123 mmHg  Left Ventricular Apex Extended Diastolic Pressure 8 mmHg  Left Ventricular Apex Extended EDP Pressure 13 mmHg  TPVR Index 14.69 HRUI  TSVR Index 51.66 HRUI  PVR SVR Ratio 0.08  TPVR/TSVR Ratio 0.28   ADDENDUM REPORT: 11/30/2016 12:39  CLINICAL DATA: 81 year old with severe aortic stenosis.  EXAM: Cardiac TAVR CT  TECHNIQUE: The patient was scanned on a Philips 256 scanner. A 120 kV retrospective scan was triggered in the descending thoracic aorta at 111 HU's. Gantry rotation speed was 270 msecs and collimation was .9 mm. 5 mg of iv Metoprolol and no nitro were given. The 3D data set was reconstructed in 5% intervals of the R-R cycle. Systolic and diastolic phases were analyzed on a dedicated work station using MPR, MIP and VRT modes. The patient received 80 cc of contrast.  FINDINGS: Aortic Valve: Severely calcified and thickened aortic valve with severely restricted leaflet opening. Minimal calcifications in the LVOT.  Aorta: Normal size, moderate diffuse calcifications.  Sinotubular Junction: 30 x 28 mm  Ascending Thoracic Aorta: 34 x 33 mm  Aortic Arch: 30 x 28 mm  Descending Thoracic Aorta: 25 x 24 mm  Sinus of Valsalva Measurements:  Non-coronary: 28 mm  Right -coronary: 29 mm  Left -coronary: 32 mm  Coronary Artery Height above Annulus:  Left Main: 12 mm  Right Coronary: 15 mm  Virtual Basal Annulus  Measurements:  Maximum/Minimum Diameter: 25 x 22 mm  Perimeter: 88 mm  Area: 443 mm2  Coronary Arteries: Normal origin.  Optimum Fluoroscopic Angle for Delivery: LAO 9 CAU 8  Severely dilated left atrium with a filling defect at the most superior and anterior portion of the left atrial appendage.  Dilated pulmonary artery measuring 33 x 31 mm suggestive of pulmonary hypertension.  IMPRESSION: 1. Severely calcified and thickened aortic valve with severely restricted leaflet opening and minimal calcifications in the LVOT. Annular measurements suitable  2. There is sufficient annulus to coronary distance.  3. Optimum Fluoroscopic Angle for Delivery: LAO 10 CAU 8  4. Severely dilated left atrium with a filling defect at the most superior and anterior portion of the left atrial appendage. This most probably represents an insufficient contrast filling during the acquisition, however a correlation with an intraoperative TEE is recommended.  5. Dilated pulmonary artery measuring 33 x 31 mm suggestive of pulmonary hypertension.  Ena Dawley   Electronically Signed By: Ena Dawley On: 11/30/2016 12:39   Addended by Dorothy Spark, MD on 11/30/2016 12:41 PM    Study Result   EXAM: OVER-READ INTERPRETATION CT CHEST  The following report is an over-read performed by radiologist Dr. Rebekah Chesterfield [facility] on 11/29/2016. This over-read does not include interpretation of cardiac or coronary anatomy or pathology. The coronary calcium score/coronary CTA interpretation by the cardiologist is attached.  COMPARISON: Chest CT 03/07/2011.  FINDINGS: Full discussion of relevant extracardiac findings will be rendered on dictation for contemporaneously obtained  CTA of the chest, abdomen and pelvis 11/29/2016.  IMPRESSION: Please see separate dictation for contemporaneously obtained CTA of the chest, abdomen and  pelvis dated 11/29/2016 for full description of extracardiac findings.  Electronically Signed: By: Vinnie Langton M.D. On: 11/29/2016 12:52        CT Angio Abd/Pel w/ and/or w/o (Accession [id number]) (Order [id number])  Imaging  Date: 11/29/2016 Department: [facility] CT IMAGING Released By: Selmer Dominion Authorizing: Rexene Alberts, MD  Exam Information   Status Exam Begun  Exam Ended   Final [99] 11/29/2016 12:18 PM 11/29/2016 12:44 PM  PACS Images   Show images for CT Angio Abd/Pel w/ and/or w/o  Study Result   CLINICAL DATA: 81 year old female with history of severe aortic stenosis. Preprocedural study prior to potential transcatheter aortic valve replacement (TAVR).  EXAM: CT ANGIOGRAPHY CHEST, ABDOMEN AND PELVIS  TECHNIQUE: Multidetector CT imaging through the chest, abdomen and pelvis was performed using the standard protocol during bolus administration of intravenous contrast. Multiplanar reconstructed images and MIPs were obtained and reviewed to evaluate the vascular anatomy.  CONTRAST: 80 mL of Isovue 370.  COMPARISON: CT the abdomen and pelvis 03/20/2011. Chest CT 03/07/2011.  FINDINGS: CTA CHEST FINDINGS  Cardiovascular: Heart size is enlarged with left atrial dilatation. Notably, within the tip of the left atrial appendage there is a filling defect, concerning for left atrial appendage thrombus. There is no significant pericardial fluid, thickening or pericardial calcification. There is aortic atherosclerosis, as well as atherosclerosis of the great vessels of the mediastinum and the coronary arteries, including calcified atherosclerotic plaque in the left main, left anterior descending and right coronary arteries. Severe thickening calcification of the aortic valve. Aberrant right subclavian artery (normal anatomical variant).  Mediastinum/Lymph Nodes: No pathologically enlarged mediastinal or hilar lymph  nodes. Esophagus is unremarkable in appearance. No axillary lymphadenopathy.  Lungs/Pleura: No suspicious appearing pulmonary nodules or masses. No acute consolidative airspace disease. No pleural effusions.  Musculoskeletal/Soft Tissues: Status post left modified radical mastectomy with left-sided breast implant. Surgical clips in the left axilla from prior lymph node dissection. Status post right shoulder arthroplasty. There are no aggressive appearing lytic or blastic lesions noted in the visualized portions of the skeleton.  CTA ABDOMEN AND PELVIS FINDINGS  Hepatobiliary: The liver has a shrunken appearance and nodular contour, suggestive of underlying cirrhosis. No discrete cystic or solid hepatic lesions. No intra or extrahepatic biliary ductal dilatation. Several partially calcified gallstones are noted lying dependently in the gallbladder. No current findings to suggest an acute cholecystitis at this time.  Pancreas: No pancreatic mass. No pancreatic ductal dilatation. No pancreatic or peripancreatic fluid or inflammatory changes.  Spleen: Unremarkable.  Adrenals/Urinary Tract: 1.6 cm low-attenuation lesion in the interpolar region of the right kidney is compatible with a simple cyst. Several other subcentimeter low-attenuation lesions in both kidneys are too small to definitively characterize, but are favored to represent tiny cysts. No hydroureteronephrosis. Urinary bladder is partially obscured by extensive beam hardening artifact from the patient's bilateral hip arthroplasties, but the visualized portions of the urinary bladder are unremarkable.  Stomach/Bowel: The appearance of the stomach is normal. There is no pathologic dilatation of small bowel or colon. Several colonic diverticulae are noted, without surrounding inflammatory changes to suggest an acute diverticulitis at this time. The appendix is not confidently identified and may be surgically absent.  Regardless, there are no inflammatory changes noted adjacent to the cecum to suggest the presence of an acute appendicitis at this time.  Vascular/Lymphatic:  Aortic atherosclerosis, with vascular findings and measurements pertinent to potential TAVR procedure, as detailed below. No aneurysm or dissection identified in the abdominal or pelvic vasculature. Celiac axis, superior mesenteric artery and inferior mesenteric artery and their major branches appear patent without definite hemodynamically significant stenosis. Single renal arteries bilaterally both appear widely patent. No lymphadenopathy noted in the abdomen or pelvis.  Reproductive: Uterus and ovaries are unremarkable in appearance.  Other: No significant volume of ascites. No pneumoperitoneum.  Musculoskeletal: Bilateral total hip arthroplasties. There are no aggressive appearing lytic or blastic lesions noted in the visualized portions of the skeleton.  VASCULAR MEASUREMENTS PERTINENT TO TAVR:  AORTA:  Minimal Aortic Diameter - 14 x 15 mm  Severity of Aortic Calcification - moderate to severe  RIGHT PELVIS:  Right Common Iliac Artery -  Minimal Diameter - 11.3 x 5.3 mm  Tortuosity - mild  Calcification - severe  Right External Iliac Artery -  Minimal Diameter - 8.4 x 8.6 mm  Tortuosity - moderate  Calcification - minimal  Right Common Femoral Artery -  Minimal Diameter - 7.2 x 5.8 mm  Tortuosity - mild  Calcification - moderate  LEFT PELVIS:  Left Common Iliac Artery -  Minimal Diameter - 8.1 x 6.4 mm  Tortuosity - mild  Calcification - severe  Left External Iliac Artery -  Minimal Diameter - 8.6 x 6.7 mm  Tortuosity - moderate to severe  Calcification - minimal  Left Common Femoral Artery -  Minimal Diameter - 6.7 x 5.2 mm  Tortuosity - mild  Calcification - moderate  Review of the MIP images confirms the above  findings.  IMPRESSION: 1. Vascular findings and measurements pertinent to potential TAVR procedure, as detailed above. This patient does appear to have suitable pelvic arterial access bilaterally, preferable on the right side due to greater vessel diameter and lower degree of tortuosity. 2. Severe thickening calcification of the aortic valve, compatible with the reported clinical history of severe aortic stenosis. 3. Apparent filling defect in the tip of the left atrial appendage, concerning for potential thrombus. Attention at time of transesophageal echocardiogram is recommended to exclude the possibility of left atrial appendage thrombus. 4. Morphologic changes in the liver suggestive of underlying cirrhosis. 5. Cholelithiasis without evidence of acute cholecystitis at this time. 6. Colonic diverticulosis without evidence of acute diverticulitis at this time. 7. Aberrant right subclavian artery (normal anatomical variant) incidentally noted. 8. Additional incidental findings, as above.   Electronically Signed By: Vinnie Langton M.D. On: 11/29/2016 13:36    STS Risk Calculator  ProcedureAVR + CABG  Risk of Mortality3.6% Morbidity or Mortality21.5% Prolonged LOS12.8% Short LOS20.5% Permanent Stroke2.0% Prolonged Vent Support17.1% DSW Infection0.4% Renal Failure6.4% Reoperation7.7%  Impression:  This 81 year old woman has stage D, severe, symptomatic aortic stenosis with NYHA class II CHF symptoms of exertional shortness of breath and fatigue with moderate exertion. He symptoms have been progressing over the past 6 months  and are now limiting her activity. She was recently admitted with an episode of chest pain that was somewhat atypical and she only had a 70% stenosis in a small diagonal on cath. I have personally reviewed her echo, cath and CT films. Echo shows a trileaflet aortic valve with severe calcification, thickening and restricted leaflet mobility. The mean gradient is 35 mm Hg with a peak velocity ratio of 0.27. There is moderate AI and she has atrial fibrillation which make assessment of the gradient more difficult. It certainly looks like a severely stenotic valve. Her  cath shows no significant coronary disease with only a 70% diagonal stenosis.  I agree that AVR is indicated in this patient for relief of her symptoms and to improve her quality of life and long term prognosis. Her risk for open surgical AVR would be moderately elevated due to her age and multiple co-morbid factors including chronic persistent atrial fib, multiple joint replacements, OSA. I think TAVR would be a reasonable alternative for her with a quicker recovery and less overall risk. Her gated cardiac CT shows anatomy favorable for a 26 mm Sapien 3 valve. Her abdominal and pelvic CT shows suitable arterial anatomy for a transfemoral approach.   The patient and her husband were counseled at length regarding treatment alternatives for management of severe symptomatic aortic stenosis. The risks and benefits of surgical intervention has been discussed in detail. Long-term prognosis with medical therapy was discussed. Alternative approaches such as conventional surgical aortic valve replacement, transcatheter aortic valve replacement, and palliative medical therapy were compared and contrasted at length. This discussion was placed in the context of the patient's own specific clinical presentation and past medical history. All of their questions been addressed. The patient is eager to proceed with TAVR as soon as possible.   Following the decision to  proceed with transcatheter aortic valve replacement, a discussion was held regarding what types of management strategies would be attempted intraoperatively in the event of life-threatening complications, including whether or not the patient would be considered a candidate for the use of cardiopulmonary bypass and/or conversion to open sternotomy for attempted surgical intervention. The patient is aware of the fact that transient use of cardiopulmonary bypass may be necessary, but the patient specifically states that she would not wish to undergo redo median sternotomy under any circumstances, even if she were to develop potentially lethal complications related to transcatheter valves appointment.  The patient has been advised of a variety of complications that might develop including but not limited to risks of death, stroke, paravalvular leak, aortic dissection or other major vascular complications, aortic annulus rupture, device embolization, cardiac rupture or perforation, mitral regurgitation, acute myocardial infarction, arrhythmia, heart block or bradycardia requiring permanent pacemaker placement, congestive heart failure, respiratory failure, renal failure, pneumonia, infection, other late complications related to structural valve deterioration or migration, or other complications that might ultimately cause a temporary or permanent loss of functional independence or other long term morbidity. The patient provides full informed consent for the procedure as described and all questions were answered.     Plan:  Transfemoral TAVR on 12/14/2016    Gaye Pollack, MD

## 2016-12-13 NOTE — Progress Notes (Signed)
Anesthesia chart review: Patient is a 81 year old female scheduled for TAVR, transfemoral approach on 12/14/16 by Dr. Angelena Form.  History includes former smoker, hypertension, severe AS, atrial fibrillation, chronic diastolic CHF, CAD ([id number] DIAG-small vessel), pulmonary hypertension (mild by 2/20178 RHC), retroperitoneal bleed (while on Lovenox bridge) 03/2011, hypercholesterolemia, arthritis ("rheumatoid" in hands; on prednisone), GERD, OSA (on CPAP), exertional dyspnea, TIA, fibromyalgia, breast cancer s/p left mastectomy '04, claustrophobia, left TKA, right total shoulder, bilateral THA, appendectomy, hard of hearing.  PCP is Dr. Maurice Small. Cardiologist is Dr. Fransico Him.  Meds include Lipitor, diltiazem, Lovenox (last dose 12/13/16 AM, confirmed with [facility] RN Ryan and with patient), Lasix, losartan, melatonin, Nitro, Prilosec, prednisone, Xarelto (last dose 12/11/16).  BP (!) 147/55   Pulse 68   Temp 36.4 C   Resp 18   Ht 5\' 7"  (1.702 m)   Wt 183 lb 1.6 oz (83.1 kg)   SpO2 100%   BMI 28.68 kg/m   EKG 11/18/16: Afib at 73 bpm with a competing junctional pacemaker, minimal voltage criteria for LVH, possible anterior infarct (age undetermined).  RHC/LHC 11/19/16: Conclusions:  Ost RCA lesion, 35 %stenosed.  Ost 1st Diag lesion, 70 %stenosed.  The left ventricular systolic function is normal.  LV end diastolic pressure is normal.  There is severe aortic valve stenosis. - Normal LV function with at least 1+ MR. - Mild right heart pressure elevation with mild pulmonary hypertension. - Moderately severe aortic valve stenosis with a peak gradient of 29 and mean gradient of 21.7.  Aortic valve area calculated to 0.8 cm2.  - Mild coronary obstructive disease with 70% focal eccentric ostial narrowing of the first diagonal branch of the LAD and superior takeoff dominant RCA with mild 30-40% very proximal stenosis eccentrically. Normal ramus intermediate and left circumflex coronary arteries. -  Mild calcification of the ascending aorta.  Supravalvular aortography was not performed to assess for aortic regurgitation. RECOMMENDATION: Consider surgical evaluation for TAV  Echo 11/18/16: Study Conclusions - Left ventricle: The cavity size was normal. Wall thickness was   increased in a pattern of moderate LVH. Systolic function was   normal. The estimated ejection fraction was in the range of 60%   to 65%. Wall motion was normal; there were no regional wall   motion abnormalities. Doppler parameters are consistent with high   ventricular filling pressure. - Aortic valve: Valve mobility was restricted. There was moderate   to severe stenosis. There was moderate regurgitation. Valve area   (VTI): 0.75 cm^2. Valve area (Vmax): 0.77 cm^2. Valve area   (Vmean): 0.69 cm^2. - Mitral valve: Calcified annulus. Mildly thickened leaflets .   There was mild regurgitation. Valve area by continuity equation   (using LVOT flow): 2.42 cm^2. - Left atrium: The atrium was severely dilated. - Right ventricle: The cavity size was mildly dilated. - Right atrium: The atrium was severely dilated. - Pulmonary arteries: Systolic pressure was moderately increased.   PA peak pressure: 50 mm Hg (S).  Coronary CT 11/29/16: IMPRESSION: 1. Severely calcified and thickened aortic valve with severely restricted leaflet opening and minimal calcifications in the LVOT. Annular measurements suitable 2. There is sufficient annulus to coronary distance. 3. Optimum Fluoroscopic Angle for Delivery:  LAO 10 CAU 8 4. Severely dilated left atrium with a filling defect at the most superior and anterior portion of the left atrial appendage. This most probably represents an insufficient contrast filling during the acquisition, however a correlation with an intraoperative TEE is recommended. 5. Dilated pulmonary artery measuring  33 x 31 mm suggestive of pulmonary hypertension.  Carotid U/S 11/29/16: Summary: Bilateral:  intimal wall thickening CCA. Mild mixed plaque origin ICA. 1-39% ICA plaquing. Vertebral artery flow is antegrade.  Preoperative CXR, CTA chest/abd/pelvis (severe thickened AV, filling defect in the tip of LAA, morphologic changes in the liver suggestive of cirrhosis, aberrant right [place] artery [normal variant]), PFTs, labs noted. Repeat PT/PTT on the day of surgery.   If labs acceptable and otherwise no significant changes then I anticipate that she can proceed as planned.  George Hugh [facility] Short Stay Center/Anesthesiology Phone [phone] 12/13/2016 12:14 PM

## 2016-12-14 ENCOUNTER — Inpatient Hospital Stay (HOSPITAL_COMMUNITY)
Admission: RE | Admit: 2016-12-14 | Discharge: 2016-12-16 | DRG: 267 | Disposition: A | Discharge status: Home / Self Care | Payer: Medicare Other | Source: Ambulatory Visit | Attending: Cardiovascular Disease | Admitting: Cardiovascular Disease

## 2016-12-14 ENCOUNTER — Encounter (HOSPITAL_COMMUNITY)
Admission: RE | Disposition: A | Discharge status: Home / Self Care | Payer: Self-pay | Source: Ambulatory Visit | Attending: Cardiovascular Disease

## 2016-12-14 ENCOUNTER — Inpatient Hospital Stay (HOSPITAL_COMMUNITY): Payer: Medicare Other | Admitting: Certified Registered Nurse Anesthetist

## 2016-12-14 ENCOUNTER — Inpatient Hospital Stay (HOSPITAL_COMMUNITY): Payer: Medicare Other | Admitting: Vascular Surgery

## 2016-12-14 ENCOUNTER — Inpatient Hospital Stay (HOSPITAL_COMMUNITY): Payer: Medicare Other

## 2016-12-14 ENCOUNTER — Encounter (HOSPITAL_COMMUNITY): Payer: Self-pay | Admitting: *Deleted

## 2016-12-14 DIAGNOSIS — Z7901 Long term (current) use of anticoagulants: Secondary | ICD-10-CM

## 2016-12-14 DIAGNOSIS — Z8673 Personal history of transient ischemic attack (TIA), and cerebral infarction without residual deficits: Secondary | ICD-10-CM

## 2016-12-14 DIAGNOSIS — M797 Fibromyalgia: Secondary | ICD-10-CM | POA: Diagnosis present

## 2016-12-14 DIAGNOSIS — Z96652 Presence of left artificial knee joint: Secondary | ICD-10-CM | POA: Diagnosis present

## 2016-12-14 DIAGNOSIS — I110 Hypertensive heart disease with heart failure: Secondary | ICD-10-CM | POA: Diagnosis present

## 2016-12-14 DIAGNOSIS — Z96643 Presence of artificial hip joint, bilateral: Secondary | ICD-10-CM | POA: Diagnosis present

## 2016-12-14 DIAGNOSIS — Z7952 Long term (current) use of systemic steroids: Secondary | ICD-10-CM

## 2016-12-14 DIAGNOSIS — I272 Pulmonary hypertension, unspecified: Secondary | ICD-10-CM | POA: Diagnosis not present

## 2016-12-14 DIAGNOSIS — I481 Persistent atrial fibrillation: Secondary | ICD-10-CM | POA: Diagnosis not present

## 2016-12-14 DIAGNOSIS — R0602 Shortness of breath: Secondary | ICD-10-CM | POA: Diagnosis not present

## 2016-12-14 DIAGNOSIS — Z87891 Personal history of nicotine dependence: Secondary | ICD-10-CM

## 2016-12-14 DIAGNOSIS — I10 Essential (primary) hypertension: Secondary | ICD-10-CM | POA: Diagnosis present

## 2016-12-14 DIAGNOSIS — I350 Nonrheumatic aortic (valve) stenosis: Secondary | ICD-10-CM | POA: Diagnosis not present

## 2016-12-14 DIAGNOSIS — Z853 Personal history of malignant neoplasm of breast: Secondary | ICD-10-CM

## 2016-12-14 DIAGNOSIS — I361 Nonrheumatic tricuspid (valve) insufficiency: Secondary | ICD-10-CM | POA: Diagnosis not present

## 2016-12-14 DIAGNOSIS — I5032 Chronic diastolic (congestive) heart failure: Secondary | ICD-10-CM | POA: Diagnosis present

## 2016-12-14 DIAGNOSIS — E785 Hyperlipidemia, unspecified: Secondary | ICD-10-CM | POA: Diagnosis present

## 2016-12-14 DIAGNOSIS — Z954 Presence of other heart-valve replacement: Secondary | ICD-10-CM | POA: Diagnosis not present

## 2016-12-14 DIAGNOSIS — I517 Cardiomegaly: Secondary | ICD-10-CM | POA: Diagnosis not present

## 2016-12-14 DIAGNOSIS — Z952 Presence of prosthetic heart valve: Secondary | ICD-10-CM

## 2016-12-14 DIAGNOSIS — Z96611 Presence of right artificial shoulder joint: Secondary | ICD-10-CM | POA: Diagnosis present

## 2016-12-14 DIAGNOSIS — Z79899 Other long term (current) drug therapy: Secondary | ICD-10-CM | POA: Diagnosis not present

## 2016-12-14 DIAGNOSIS — R001 Bradycardia, unspecified: Secondary | ICD-10-CM | POA: Diagnosis not present

## 2016-12-14 DIAGNOSIS — H9190 Unspecified hearing loss, unspecified ear: Secondary | ICD-10-CM | POA: Diagnosis present

## 2016-12-14 DIAGNOSIS — I2510 Atherosclerotic heart disease of native coronary artery without angina pectoris: Secondary | ICD-10-CM | POA: Diagnosis not present

## 2016-12-14 DIAGNOSIS — I25810 Atherosclerosis of coronary artery bypass graft(s) without angina pectoris: Secondary | ICD-10-CM | POA: Diagnosis not present

## 2016-12-14 DIAGNOSIS — K219 Gastro-esophageal reflux disease without esophagitis: Secondary | ICD-10-CM | POA: Diagnosis present

## 2016-12-14 DIAGNOSIS — G4733 Obstructive sleep apnea (adult) (pediatric): Secondary | ICD-10-CM | POA: Diagnosis present

## 2016-12-14 DIAGNOSIS — I4821 Permanent atrial fibrillation: Secondary | ICD-10-CM | POA: Diagnosis present

## 2016-12-14 DIAGNOSIS — Z953 Presence of xenogenic heart valve: Secondary | ICD-10-CM

## 2016-12-14 DIAGNOSIS — Z006 Encounter for examination for normal comparison and control in clinical research program: Secondary | ICD-10-CM | POA: Diagnosis not present

## 2016-12-14 DIAGNOSIS — J9811 Atelectasis: Secondary | ICD-10-CM | POA: Diagnosis not present

## 2016-12-14 HISTORY — PX: TEE WITHOUT CARDIOVERSION: SHX5443

## 2016-12-14 HISTORY — PX: TRANSCATHETER AORTIC VALVE REPLACEMENT, TRANSFEMORAL: SHX6400

## 2016-12-14 HISTORY — DX: Atherosclerotic heart disease of native coronary artery without angina pectoris: I25.10

## 2016-12-14 LAB — POCT I-STAT 3, ART BLOOD GAS (G3+)
Acid-Base Excess: 1 mmol/L (ref 0.0–2.0)
Bicarbonate: 25.8 mmol/L (ref 20.0–28.0)
O2 SAT: 99 %
PCO2 ART: 39.2 mmHg (ref 32.0–48.0)
PH ART: 7.424 (ref 7.350–7.450)
PO2 ART: 134 mmHg — AB (ref 83.0–108.0)
Patient temperature: 97.5
TCO2: 27 mmol/L (ref 0–100)

## 2016-12-14 LAB — POCT I-STAT, CHEM 8
BUN: 14 mg/dL (ref 6–20)
BUN: 14 mg/dL (ref 6–20)
BUN: 14 mg/dL (ref 6–20)
CALCIUM ION: 1.16 mmol/L (ref 1.15–1.40)
CHLORIDE: 106 mmol/L (ref 101–111)
CHLORIDE: 107 mmol/L (ref 101–111)
CREATININE: 0.8 mg/dL (ref 0.44–1.00)
Calcium, Ion: 1.19 mmol/L (ref 1.15–1.40)
Calcium, Ion: 1.18 mmol/L (ref 1.15–1.40)
Chloride: 106 mmol/L (ref 101–111)
Creatinine, Ser: 0.8 mg/dL (ref 0.44–1.00)
Creatinine, Ser: 0.8 mg/dL (ref 0.44–1.00)
GLUCOSE: 99 mg/dL (ref 65–99)
GLUCOSE: 107 mg/dL — AB (ref 65–99)
Glucose, Bld: 101 mg/dL — ABNORMAL HIGH (ref 65–99)
HCT: 34 % — ABNORMAL LOW (ref 36.0–46.0)
HCT: 29 % — ABNORMAL LOW (ref 36.0–46.0)
HEMATOCRIT: 31 % — AB (ref 36.0–46.0)
HEMOGLOBIN: 11.6 g/dL — AB (ref 12.0–15.0)
Hemoglobin: 9.9 g/dL — ABNORMAL LOW (ref 12.0–15.0)
Hemoglobin: 10.5 g/dL — ABNORMAL LOW (ref 12.0–15.0)
POTASSIUM: 3.6 mmol/L (ref 3.5–5.1)
POTASSIUM: 3.6 mmol/L (ref 3.5–5.1)
Potassium: 3.4 mmol/L — ABNORMAL LOW (ref 3.5–5.1)
SODIUM: 142 mmol/L (ref 135–145)
Sodium: 142 mmol/L (ref 135–145)
Sodium: 143 mmol/L (ref 135–145)
TCO2: 24 mmol/L (ref 0–100)
TCO2: 24 mmol/L (ref 0–100)
TCO2: 26 mmol/L (ref 0–100)

## 2016-12-14 LAB — CBC
HCT: 34.2 % — ABNORMAL LOW (ref 36.0–46.0)
Hemoglobin: 11.4 g/dL — ABNORMAL LOW (ref 12.0–15.0)
MCH: 31.1 pg (ref 26.0–34.0)
MCHC: 33.3 g/dL (ref 30.0–36.0)
MCV: 93.2 fL (ref 78.0–100.0)
PLATELETS: 142 10*3/uL — AB (ref 150–400)
RBC: 3.67 MIL/uL — ABNORMAL LOW (ref 3.87–5.11)
RDW: 13.2 % (ref 11.5–15.5)
WBC: 7.2 10*3/uL (ref 4.0–10.5)

## 2016-12-14 LAB — PROTIME-INR
INR: 1.12
INR: 1.32
Prothrombin Time: 14.5 seconds (ref 11.4–15.2)
Prothrombin Time: 16.5 seconds — ABNORMAL HIGH (ref 11.4–15.2)

## 2016-12-14 LAB — ECHO TEE: P 1/2 time: 785 ms

## 2016-12-14 LAB — APTT
APTT: 28 s (ref 24–36)
aPTT: 32 seconds (ref 24–36)

## 2016-12-14 LAB — POCT I-STAT 4, (NA,K, GLUC, HGB,HCT)
GLUCOSE: 111 mg/dL — AB (ref 65–99)
HEMATOCRIT: 32 % — AB (ref 36.0–46.0)
HEMOGLOBIN: 10.9 g/dL — AB (ref 12.0–15.0)
POTASSIUM: 3.8 mmol/L (ref 3.5–5.1)
SODIUM: 143 mmol/L (ref 135–145)

## 2016-12-14 SURGERY — IMPLANTATION, AORTIC VALVE, TRANSCATHETER, FEMORAL APPROACH
Anesthesia: General

## 2016-12-14 MED ORDER — GLUCOSAMINE-CHONDROITIN 500-400 MG PO TABS: 1.0000 | ORAL_TABLET | Freq: Two times a day (BID) | ORAL | Status: DC

## 2016-12-14 MED ORDER — ACETAMINOPHEN 650 MG RE SUPP: 650.0000 mg | Freq: Once | RECTAL | Status: DC

## 2016-12-14 MED ORDER — SUGAMMADEX SODIUM 200 MG/2ML IV SOLN
INTRAVENOUS | Status: DC | PRN
  Administered 2016-12-14: 200 mg via INTRAVENOUS

## 2016-12-14 MED ORDER — ONDANSETRON HCL 4 MG/2ML IJ SOLN
INTRAMUSCULAR | Status: AC
  Filled 2016-12-14: qty 2

## 2016-12-14 MED ORDER — PROPOFOL 10 MG/ML IV BOLUS
INTRAVENOUS | Status: DC | PRN
  Administered 2016-12-14: 70 mg via INTRAVENOUS

## 2016-12-14 MED ORDER — ASPIRIN 81 MG PO CHEW
324.0000 mg | CHEWABLE_TABLET | Freq: Every day | ORAL | Status: DC
  Filled 2016-12-14: qty 4

## 2016-12-14 MED ORDER — LOSARTAN POTASSIUM 50 MG PO TABS
50.0000 mg | ORAL_TABLET | Freq: Every day | ORAL | Status: DC
  Administered 2016-12-15 – 2016-12-16 (×2): 50 mg via ORAL
  Filled 2016-12-14 (×2): qty 1

## 2016-12-14 MED ORDER — ACETAMINOPHEN 500 MG PO TABS
1000.0000 mg | ORAL_TABLET | Freq: Four times a day (QID) | ORAL | Status: DC
  Filled 2016-12-14: qty 2

## 2016-12-14 MED ORDER — MIDAZOLAM HCL 2 MG/2ML IJ SOLN
INTRAMUSCULAR | Status: AC
  Filled 2016-12-14: qty 2

## 2016-12-14 MED ORDER — ASPIRIN EC 325 MG PO TBEC
325.0000 mg | DELAYED_RELEASE_TABLET | Freq: Every day | ORAL | Status: DC
  Administered 2016-12-15: 325 mg via ORAL
  Filled 2016-12-14: qty 1

## 2016-12-14 MED ORDER — PREDNISONE 5 MG PO TABS
5.0000 mg | ORAL_TABLET | Freq: Every day | ORAL | Status: DC
  Administered 2016-12-15 – 2016-12-16 (×2): 5 mg via ORAL
  Filled 2016-12-14 (×2): qty 1

## 2016-12-14 MED ORDER — HEPARIN SODIUM (PORCINE) 1000 UNIT/ML IJ SOLN
INTRAMUSCULAR | Status: AC
  Filled 2016-12-14: qty 1

## 2016-12-14 MED ORDER — PHENYLEPHRINE 40 MCG/ML (10ML) SYRINGE FOR IV PUSH (FOR BLOOD PRESSURE SUPPORT)
PREFILLED_SYRINGE | INTRAVENOUS | Status: AC
  Filled 2016-12-14: qty 10

## 2016-12-14 MED ORDER — ATORVASTATIN CALCIUM 20 MG PO TABS
20.0000 mg | ORAL_TABLET | Freq: Every day | ORAL | Status: DC
  Administered 2016-12-15: 20 mg via ORAL
  Filled 2016-12-14: qty 1

## 2016-12-14 MED ORDER — MIDAZOLAM HCL 2 MG/2ML IJ SOLN: 2.0000 mg | INTRAMUSCULAR | Status: DC | PRN

## 2016-12-14 MED ORDER — PROTAMINE SULFATE 10 MG/ML IV SOLN
INTRAVENOUS | Status: DC | PRN
  Administered 2016-12-14: 100 mg via INTRAVENOUS

## 2016-12-14 MED ORDER — SODIUM CHLORIDE 0.9 % IV SOLN
0.0000 ug/min | INTRAVENOUS | Status: DC
  Filled 2016-12-14: qty 2

## 2016-12-14 MED ORDER — DEXMEDETOMIDINE HCL IN NACL 200 MCG/50ML IV SOLN: 0.1000 ug/kg/h | INTRAVENOUS | Status: DC

## 2016-12-14 MED ORDER — SODIUM CHLORIDE 0.9 % IV SOLN
INTRAVENOUS | Status: DC | PRN
  Administered 2016-12-14: 1500 mL

## 2016-12-14 MED ORDER — LIDOCAINE HCL (CARDIAC) 20 MG/ML IV SOLN
INTRAVENOUS | Status: DC | PRN
  Administered 2016-12-14: 20 mg via INTRAVENOUS

## 2016-12-14 MED ORDER — CHLORHEXIDINE GLUCONATE 0.12 % MT SOLN
15.0000 mL | OROMUCOSAL | Status: AC
  Administered 2016-12-14: 15 mL via OROMUCOSAL

## 2016-12-14 MED ORDER — DEXMEDETOMIDINE HCL IN NACL 400 MCG/100ML IV SOLN
INTRAVENOUS | Status: DC | PRN
  Administered 2016-12-14: .5 ug/kg/h via INTRAVENOUS

## 2016-12-14 MED ORDER — PROTAMINE SULFATE 10 MG/ML IV SOLN
INTRAVENOUS | Status: AC
  Filled 2016-12-14: qty 10

## 2016-12-14 MED ORDER — CALCIUM CARBONATE-VITAMIN D 500-200 MG-UNIT PO TABS
1.0000 | ORAL_TABLET | Freq: Every day | ORAL | Status: DC
  Administered 2016-12-15 – 2016-12-16 (×2): 1 via ORAL
  Filled 2016-12-14 (×2): qty 1

## 2016-12-14 MED ORDER — PROPOFOL 10 MG/ML IV BOLUS
INTRAVENOUS | Status: AC
  Filled 2016-12-14: qty 20

## 2016-12-14 MED ORDER — DEXTROSE 5 % IV SOLN
1.5000 g | Freq: Two times a day (BID) | INTRAVENOUS | Status: AC
  Administered 2016-12-14 – 2016-12-16 (×4): 1.5 g via INTRAVENOUS
  Filled 2016-12-14 (×5): qty 1.5

## 2016-12-14 MED ORDER — MUPIROCIN 2 % EX OINT
1.0000 "application " | TOPICAL_OINTMENT | Freq: Two times a day (BID) | CUTANEOUS | Status: DC
  Administered 2016-12-14 – 2016-12-16 (×5): 1 via NASAL
  Filled 2016-12-14: qty 22

## 2016-12-14 MED ORDER — DILTIAZEM HCL ER COATED BEADS 360 MG PO CP24
360.0000 mg | ORAL_CAPSULE | Freq: Every evening | ORAL | Status: DC
  Filled 2016-12-14: qty 1

## 2016-12-14 MED ORDER — ACETAMINOPHEN 160 MG/5ML PO SOLN: 650.0000 mg | Freq: Once | ORAL | Status: DC

## 2016-12-14 MED ORDER — ACETAMINOPHEN 160 MG/5ML PO SOLN: 1000.0000 mg | Freq: Four times a day (QID) | ORAL | Status: DC

## 2016-12-14 MED ORDER — ROCURONIUM BROMIDE 100 MG/10ML IV SOLN
INTRAVENOUS | Status: DC | PRN
  Administered 2016-12-14: 40 mg via INTRAVENOUS

## 2016-12-14 MED ORDER — PANTOPRAZOLE SODIUM 40 MG PO TBEC: 40.0000 mg | DELAYED_RELEASE_TABLET | Freq: Every day | ORAL | Status: DC

## 2016-12-14 MED ORDER — IODIXANOL 320 MG/ML IV SOLN
INTRAVENOUS | Status: DC | PRN
  Administered 2016-12-14: 84 mL via INTRA_ARTERIAL

## 2016-12-14 MED ORDER — MIDAZOLAM HCL 2 MG/2ML IJ SOLN
INTRAMUSCULAR | Status: DC | PRN
  Administered 2016-12-14: 0.5 mg via INTRAVENOUS

## 2016-12-14 MED ORDER — ONDANSETRON HCL 4 MG/2ML IJ SOLN
INTRAMUSCULAR | Status: DC | PRN
  Administered 2016-12-14: 4 mg via INTRAVENOUS

## 2016-12-14 MED ORDER — FENTANYL CITRATE (PF) 100 MCG/2ML IJ SOLN
INTRAMUSCULAR | Status: DC | PRN
  Administered 2016-12-14 (×2): 50 ug via INTRAVENOUS

## 2016-12-14 MED ORDER — FAMOTIDINE IN NACL 20-0.9 MG/50ML-% IV SOLN
20.0000 mg | Freq: Two times a day (BID) | INTRAVENOUS | Status: DC
  Administered 2016-12-14: 20 mg via INTRAVENOUS
  Filled 2016-12-14: qty 50

## 2016-12-14 MED ORDER — LIDOCAINE 2% (20 MG/ML) 5 ML SYRINGE
INTRAMUSCULAR | Status: AC
  Filled 2016-12-14: qty 5

## 2016-12-14 MED ORDER — FENTANYL CITRATE (PF) 250 MCG/5ML IJ SOLN
INTRAMUSCULAR | Status: AC
  Filled 2016-12-14: qty 5

## 2016-12-14 MED ORDER — HEPARIN SODIUM (PORCINE) 1000 UNIT/ML IJ SOLN
INTRAMUSCULAR | Status: DC | PRN
  Administered 2016-12-14: 10000 [IU] via INTRAVENOUS

## 2016-12-14 MED ORDER — VANCOMYCIN HCL IN DEXTROSE 1-5 GM/200ML-% IV SOLN
1000.0000 mg | Freq: Once | INTRAVENOUS | Status: AC
  Administered 2016-12-14: 1000 mg via INTRAVENOUS
  Filled 2016-12-14: qty 200

## 2016-12-14 MED ORDER — LACTATED RINGERS IV SOLN: 500.0000 mL | Freq: Once | INTRAVENOUS | Status: DC | PRN

## 2016-12-14 MED ORDER — REMIFENTANIL HCL 1 MG IV SOLR
INTRAVENOUS | Status: DC | PRN
  Administered 2016-12-14: .5 ug/kg/min via INTRAVENOUS

## 2016-12-14 MED ORDER — ONDANSETRON HCL 4 MG/2ML IJ SOLN
4.0000 mg | Freq: Four times a day (QID) | INTRAMUSCULAR | Status: DC | PRN
Start: 2016-12-14 — End: 2016-12-16

## 2016-12-14 MED ORDER — MELATONIN 3 MG PO TABS
9.0000 mg | ORAL_TABLET | Freq: Every day | ORAL | Status: DC
  Administered 2016-12-14 – 2016-12-15 (×2): 9 mg via ORAL
  Filled 2016-12-14 (×2): qty 3

## 2016-12-14 MED ORDER — CHLORHEXIDINE GLUCONATE 4 % EX LIQD: 60.0000 mL | Freq: Once | CUTANEOUS | Status: DC

## 2016-12-14 MED ORDER — ALBUMIN HUMAN 5 % IV SOLN
INTRAVENOUS | Status: DC | PRN
  Administered 2016-12-14: 09:00:00 via INTRAVENOUS

## 2016-12-14 MED ORDER — ALBUMIN HUMAN 5 % IV SOLN: 250.0000 mL | INTRAVENOUS | Status: DC | PRN

## 2016-12-14 MED ORDER — 0.9 % SODIUM CHLORIDE (POUR BTL) OPTIME
TOPICAL | Status: DC | PRN
  Administered 2016-12-14: 5000 mL

## 2016-12-14 MED ORDER — LACTATED RINGERS IV SOLN
INTRAVENOUS | Status: DC | PRN
  Administered 2016-12-14: 07:00:00 via INTRAVENOUS

## 2016-12-14 MED ORDER — NOREPINEPHRINE BITARTRATE 1 MG/ML IV SOLN
INTRAVENOUS | Status: DC | PRN
  Administered 2016-12-14: 2 ug/min via INTRAVENOUS

## 2016-12-14 MED ORDER — NITROGLYCERIN IN D5W 200-5 MCG/ML-% IV SOLN
0.0000 ug/min | INTRAVENOUS | Status: DC
  Administered 2016-12-14: 5 ug/min via INTRAVENOUS

## 2016-12-14 MED ORDER — PANTOPRAZOLE SODIUM 40 MG PO TBEC
40.0000 mg | DELAYED_RELEASE_TABLET | Freq: Every day | ORAL | Status: DC
  Administered 2016-12-15 – 2016-12-16 (×2): 40 mg via ORAL
  Filled 2016-12-14 (×2): qty 1

## 2016-12-14 MED ORDER — CHLORHEXIDINE GLUCONATE 4 % EX LIQD
60.0000 mL | Freq: Once | CUTANEOUS | Status: DC
Start: 2016-12-14 — End: 2016-12-14

## 2016-12-14 MED ORDER — CHLORHEXIDINE GLUCONATE CLOTH 2 % EX PADS
6.0000 | MEDICATED_PAD | Freq: Every day | CUTANEOUS | Status: DC
  Administered 2016-12-15: 6 via TOPICAL

## 2016-12-14 MED ORDER — SODIUM CHLORIDE 0.9 % IV SOLN
1.0000 mL/kg/h | INTRAVENOUS | Status: AC
  Administered 2016-12-14: 1 mL/kg/h via INTRAVENOUS

## 2016-12-14 MED ORDER — CHLORHEXIDINE GLUCONATE 4 % EX LIQD: 30.0000 mL | CUTANEOUS | Status: DC

## 2016-12-14 MED ORDER — NITROGLYCERIN 0.4 MG SL SUBL: 0.4000 mg | SUBLINGUAL_TABLET | SUBLINGUAL | Status: DC | PRN

## 2016-12-14 SURGICAL SUPPLY — 97 items
ADAPTER UNIV SWAN GANZ BIP (ADAPTER) ×1 IMPLANT
ADAPTER UNV SWAN GANZ BIP (ADAPTER) ×1
ADH SKN CLS APL DERMABOND .7 (GAUZE/BANDAGES/DRESSINGS) ×1
ADPR CATH UNV NS SG CATH (ADAPTER) ×1
BAG BANDED W/RUBBER/TAPE 36X54 (MISCELLANEOUS) ×2 IMPLANT
BAG DECANTER FOR FLEXI CONT (MISCELLANEOUS) ×1 IMPLANT
BAG EQP BAND 135X91 W/RBR TAPE (MISCELLANEOUS) ×1
BAG SNAP BAND KOVER 36X36 (MISCELLANEOUS) ×4 IMPLANT
BLADE CLIPPER SURG (BLADE) IMPLANT
BLADE OSCILLATING /SAGITTAL (BLADE) IMPLANT
BLADE STERNUM SYSTEM 6 (BLADE) ×2 IMPLANT
CABLE PACING FASLOC BIEGE (MISCELLANEOUS) ×2 IMPLANT
CABLE PACING FASLOC BLUE (MISCELLANEOUS) ×2 IMPLANT
CANNULA FEM VENOUS REMOTE 22FR (CANNULA) IMPLANT
CANNULA OPTISITE PERFUSION 16F (CANNULA) IMPLANT
CANNULA OPTISITE PERFUSION 18F (CANNULA) IMPLANT
CATH DIAG EXPO 6F VENT PIG 145 (CATHETERS) ×4 IMPLANT
CATH EXPO 5FR AL1 (CATHETERS) ×2 IMPLANT
CATH S G BIP PACING (SET/KITS/TRAYS/PACK) ×4 IMPLANT
CLIP TI MEDIUM 24 (CLIP) ×2 IMPLANT
CLIP TI WIDE RED SMALL 24 (CLIP) ×2 IMPLANT
CONT SPEC 4OZ CLIKSEAL STRL BL (MISCELLANEOUS) ×4 IMPLANT
COVER BACK TABLE 24X17X13 BIG (DRAPES) ×2 IMPLANT
COVER BACK TABLE 60X90IN (DRAPES) ×2 IMPLANT
COVER BACK TABLE 80X110 HD (DRAPES) ×2 IMPLANT
COVER DOME SNAP 22 D (MISCELLANEOUS) ×2 IMPLANT
COVER MAYO STAND STRL (DRAPES) ×2 IMPLANT
CRADLE DONUT ADULT HEAD (MISCELLANEOUS) ×2 IMPLANT
DERMABOND ADVANCED (GAUZE/BANDAGES/DRESSINGS) ×1
DERMABOND ADVANCED .7 DNX12 (GAUZE/BANDAGES/DRESSINGS) ×1 IMPLANT
DEVICE CLOSURE PERCLS PRGLD 6F (VASCULAR PRODUCTS) IMPLANT
DRAPE INCISE IOBAN 66X45 STRL (DRAPES) IMPLANT
DRAPE SLUSH MACHINE 52X66 (DRAPES) ×2 IMPLANT
DRSG TEGADERM 4X4.75 (GAUZE/BANDAGES/DRESSINGS) ×3 IMPLANT
ELECT REM PT RETURN 9FT ADLT (ELECTROSURGICAL) ×4
ELECTRODE REM PT RTRN 9FT ADLT (ELECTROSURGICAL) ×2 IMPLANT
FELT TEFLON 6X6 (MISCELLANEOUS) ×2 IMPLANT
FEMORAL VENOUS CANN RAP (CANNULA) IMPLANT
GAUZE SPONGE 4X4 12PLY STRL (GAUZE/BANDAGES/DRESSINGS) ×2 IMPLANT
GLOVE BIO SURGEON STRL SZ8 (GLOVE) ×4 IMPLANT
GLOVE EUDERMIC 7 POWDERFREE (GLOVE) ×4 IMPLANT
GLOVE ORTHO TXT STRL SZ7.5 (GLOVE) ×4 IMPLANT
GOWN STRL REUS W/ TWL LRG LVL3 (GOWN DISPOSABLE) ×3 IMPLANT
GOWN STRL REUS W/ TWL XL LVL3 (GOWN DISPOSABLE) ×6 IMPLANT
GOWN STRL REUS W/TWL LRG LVL3 (GOWN DISPOSABLE) ×6
GOWN STRL REUS W/TWL XL LVL3 (GOWN DISPOSABLE) ×12
GUIDEWIRE SAF TJ AMPL .035X180 (WIRE) ×2 IMPLANT
GUIDEWIRE SAFE TJ AMPLATZ EXST (WIRE) ×2 IMPLANT
GUIDEWIRE STRAIGHT .035 260CM (WIRE) ×2 IMPLANT
INSERT FOGARTY 61MM (MISCELLANEOUS) ×2 IMPLANT
INSERT FOGARTY SM (MISCELLANEOUS) IMPLANT
INSERT FOGARTY XLG (MISCELLANEOUS) IMPLANT
KIT BASIN OR (CUSTOM PROCEDURE TRAY) ×2 IMPLANT
KIT DILATOR VASC 18G NDL (KITS) IMPLANT
KIT HEART LEFT (KITS) ×2 IMPLANT
KIT ROOM TURNOVER OR (KITS) ×2 IMPLANT
KIT SUCTION CATH 14FR (SUCTIONS) ×4 IMPLANT
NDL PERC 18GX7CM (NEEDLE) ×1 IMPLANT
NEEDLE PERC 18GX7CM (NEEDLE) ×2 IMPLANT
NS IRRIG 1000ML POUR BTL (IV SOLUTION) ×6 IMPLANT
PACK AORTA (CUSTOM PROCEDURE TRAY) ×2 IMPLANT
PAD ARMBOARD 7.5X6 YLW CONV (MISCELLANEOUS) ×4 IMPLANT
PAD ELECT DEFIB RADIOL ZOLL (MISCELLANEOUS) ×2 IMPLANT
PERCLOSE PROGLIDE 6F (VASCULAR PRODUCTS) ×4
SET MICROPUNCTURE 5F STIFF (MISCELLANEOUS) ×2 IMPLANT
SHEATH AVANTI 11CM 8FR (MISCELLANEOUS) ×2 IMPLANT
SHEATH PINNACLE 6F 10CM (SHEATH) ×4 IMPLANT
SLEEVE REPOSITIONING LENGTH 30 (MISCELLANEOUS) ×2 IMPLANT
SPONGE GAUZE 4X4 12PLY STER LF (GAUZE/BANDAGES/DRESSINGS) ×2 IMPLANT
SPONGE LAP 4X18 X RAY DECT (DISPOSABLE) ×2 IMPLANT
STOPCOCK MORSE 400PSI 3WAY (MISCELLANEOUS) ×12 IMPLANT
SUT ETHIBOND X763 2 0 SH 1 (SUTURE) IMPLANT
SUT GORETEX CV 4 TH 22 36 (SUTURE) IMPLANT
SUT GORETEX CV4 TH-18 (SUTURE) IMPLANT
SUT GORETEX TH-18 36 INCH (SUTURE) IMPLANT
SUT PROLENE 3 0 SH1 36 (SUTURE) IMPLANT
SUT PROLENE 4 0 RB 1 (SUTURE)
SUT PROLENE 4-0 RB1 .5 CRCL 36 (SUTURE) IMPLANT
SUT PROLENE 5 0 C 1 36 (SUTURE) IMPLANT
SUT PROLENE 6 0 C 1 30 (SUTURE) IMPLANT
SUT SILK  1 MH (SUTURE) ×1
SUT SILK 1 MH (SUTURE) ×1 IMPLANT
SUT SILK 2 0 SH CR/8 (SUTURE) IMPLANT
SUT VIC AB 2-0 CT1 27 (SUTURE)
SUT VIC AB 2-0 CT1 TAPERPNT 27 (SUTURE) IMPLANT
SUT VIC AB 2-0 CTX 36 (SUTURE) IMPLANT
SUT VIC AB 3-0 SH 8-18 (SUTURE) IMPLANT
SYR 10ML LL (SYRINGE) ×6 IMPLANT
SYR 30ML LL (SYRINGE) ×4 IMPLANT
SYR 50ML LL SCALE MARK (SYRINGE) ×2 IMPLANT
TOWEL OR 17X26 10 PK STRL BLUE (TOWEL DISPOSABLE) ×4 IMPLANT
TRANSDUCER W/STOPCOCK (MISCELLANEOUS) ×4 IMPLANT
TRAY FOLEY IC TEMP SENS 16FR (CATHETERS) ×2 IMPLANT
TUBING HIGH PRESSURE 120CM (CONNECTOR) ×2 IMPLANT
VALVE HEART TRANSCATH SZ3 26MM (Prosthesis & Implant Heart) ×1 IMPLANT
WIRE AMPLATZ SS-J .035X180CM (WIRE) ×2 IMPLANT
WIRE BENTSON .035X145CM (WIRE) ×2 IMPLANT

## 2016-12-14 NOTE — Progress Notes (Signed)
PHARMACIST - PHYSICIAN ORDER COMMUNICATION  CONCERNING: P&T Medication Policy on Herbal Medications  DESCRIPTION:  This patient's order for:  Glucosamine-Chondroitin  has been noted.  This product(s) is classified as an "herbal" or natural product. Due to a lack of definitive safety studies or FDA approval, nonstandard manufacturing practices, plus the potential risk of unknown drug-drug interactions while on inpatient medications, the Pharmacy and Therapeutics Committee does not permit the use of "herbal" or natural products of this type within [facility].   ACTION TAKEN: The pharmacy department is unable to verify this order at this time. Please reevaluate patient's clinical condition at discharge and address if the herbal or natural product(s) should be resumed at that time.   Kelvin Cellar, RPh pager [phone] 12/14/2016 10:32 AM

## 2016-12-14 NOTE — Transfer of Care (Signed)
Immediate Anesthesia Transfer of Care Note  Patient: [name]  Procedure(s) Performed: Procedure(s): TRANSCATHETER AORTIC VALVE REPLACEMENT, TRANSFEMORAL (N/A) TRANSESOPHAGEAL ECHOCARDIOGRAM (TEE) (N/A)  Patient Location: PACU  Anesthesia Type:General  Level of Consciousness: awake, alert , oriented and patient cooperative  Airway & Oxygen Therapy: Patient Spontanous Breathing and Patient connected to nasal cannula oxygen  Post-op Assessment: Report given to RN, Post -op Vital signs reviewed and stable and Patient moving all extremities  Post vital signs: Reviewed and stable  Last Vitals:  Vitals:   12/14/16 0552 12/14/16 0909  BP: (!) 127/53   Pulse: (!) 50 (!) 39  Resp: 18   Temp: 36.6 C     Last Pain:  Vitals:   12/14/16 0552  TempSrc: Oral         Complications: No apparent anesthesia complications

## 2016-12-14 NOTE — Anesthesia Procedure Notes (Signed)
Central Venous Catheter Insertion Performed by: Annye Asa, anesthesiologist Start/End2/27/2018 6:51 AM, 12/14/2016 7:04 AM Patient location: Pre-op. Preanesthetic checklist: patient identified, IV checked, risks and benefits discussed, surgical consent, monitors and equipment checked, pre-op evaluation, timeout performed and anesthesia consent Position: supine Lidocaine 1% used for infiltration and patient sedated Hand hygiene performed , maximum sterile barriers used  and Seldinger technique used Catheter size: 8 Fr Total catheter length 16. Double lumen Procedure performed using ultrasound guided technique. Ultrasound Notes:anatomy identified, needle tip was noted to be adjacent to the nerve/plexus identified, no ultrasound evidence of intravascular and/or intraneural injection and image(s) printed for medical record Attempts: 1 Following insertion, line sutured, dressing applied and Biopatch. Post procedure assessment: blood return through all ports, free fluid flow and no air  Patient tolerated the procedure well with no immediate complications. Additional procedure comments: CVP: Timeout, sterile prep, drape, FBP R neck.  Trendelenburg position.  1% lido local, finder and trocar RIJ 1st pass with US guidance.  2 lumen placed over J wire. Biopatch and sterile dressing on.  Patient tolerated well.  VSS.  Jenita Seashore, MD.

## 2016-12-14 NOTE — Anesthesia Procedure Notes (Signed)
Procedure Name: Intubation Date/Time: 12/14/2016 7:53 AM Performed by: Rebekah Chesterfield L Pre-anesthesia Checklist: Patient identified, Emergency Drugs available, Suction available and Patient being monitored Patient Re-evaluated:Patient Re-evaluated prior to inductionOxygen Delivery Method: Circle System Utilized Preoxygenation: Pre-oxygenation with 100% oxygen Intubation Type: IV induction Ventilation: Mask ventilation without difficulty and Oral airway inserted - appropriate to patient size Laryngoscope Size: Mac and 3 Grade View: Grade I Tube type: Oral Tube size: 7.5 mm Number of attempts: 1 Airway Equipment and Method: Stylet Placement Confirmation: ETT inserted through vocal cords under direct vision,  positive ETCO2 and breath sounds checked- equal and bilateral Secured at: 20 cm Tube secured with: Tape Dental Injury: Teeth and Oropharynx as per pre-operative assessment

## 2016-12-14 NOTE — Progress Notes (Signed)
  Echocardiogram Echocardiogram Transesophageal has been performed.  [name] 12/14/2016, 9:37 AM

## 2016-12-14 NOTE — CV Procedure (Signed)
[facility]  TAVR OPERATIVE NOTE   Date of Procedure:  12/14/2016  Preoperative Diagnosis: Severe Aortic Stenosis   Postoperative Diagnosis: Same   Procedure:    Transcatheter Aortic Valve Replacement - Transfemoral Approach  Edwards Sapien 3 THV (size 26 mm, model # [id number], serial # [id number])   Co-Surgeons:  Gaye Pollack, MD and Lauree Chandler, MD  Anesthesiologist:  Glennon Mac  Echocardiographer:  Meda Coffee  Pre-operative Echo Findings:  Severe aortic stenosis  Normal left ventricular systolic function  Post-operative Echo Findings:  No paravalvular leak  Normal left ventricular systolic function    BRIEF CLINICAL NOTE AND INDICATIONS FOR SURGERY  The patient is a 81 year old woman with a history of aortic stenosis, persistent atrial fibrillation on Xarelto, hypertension, OSA on CPAP, chronic diastolic heart failure and degenerative arthritis s/p bilateral hip replacements, left total knee replacement and right total shoulder replacement. She has been followed with serial echocardiograms by Dr. Radford Pax and an echo in 10/2015 showed moderate AS with a mean gradient of 32 mm Hg. Over the past several months she has developed progressive exertional shortness of breath. This does not occur with normal activities in her house but walking fast will bring it on and she has to stop to rest. She had not had any chest pain or pressure and no dizziness or syncope. Then on 11/17/2016 when she developed sudden onset of chest discomfort. . She took a sublingual nitroglycerin with some relief but the pain did not resolve completely and it recurred in the early morning hours of 11/18/2016. She went to the ER where symptoms were again relieved by administration of nitroglycerin and morphine. Baseline EKG showed rate controlled atrial fibrillation without significant ST segment changes. Initial troponin was negative and follow-up troponin  was slightly elevated to 0.06. The  patient wasadmitted to the hospital and has remained pain-free ever since. Transthoracic echocardiogram on 11/18/2016 showed severe aortic stenosis with a mean gradient of 35 mm Hg, a peak of 57 mm Hg and a DVI of 0.27. There was moderate aortic insufficiency, mild mitral regurgitation and normal left ventricular systolic function with moderate left ventricular hypertrophy. Left and right heart catheterization was performed 11/19/2016 showing severe aortic stenosis with peak to peak and mean transvalvular gradients of 29 and 21.7 mmHg respectively, corresponding to an aortic valve area calculated 0.8 cm. The patient was noted to have 70% focal ostial stenosis of the first diagonal branch, 30-40% stenosis of the proximal right coronary artery, and otherwise very mild nonobstructive coronary artery disease. There was mild pulmonary hypertension. CT scans show appropriate anatomy for right femoral access for TAVR.   During the course of the patient's preoperative work up they have been evaluated comprehensively by a multidisciplinary team of specialists coordinated through the [facility] in the [facility].  They have been demonstrated to suffer from symptomatic severe aortic stenosis as noted above. The patient has been counseled extensively as to the relative risks and benefits of all options for the treatment of severe aortic stenosis including long term medical therapy, conventional surgery for aortic valve replacement, and transcatheter aortic valve replacement.  The patient has been independently evaluated by two cardiac surgeons including Dr Roxy Manns and Dr. Cyndia Bent, and they are felt to be at high risk for conventional surgical aortic valve replacement. Both surgeons indicated the patient would be a poor candidate for conventional surgery. Based upon review of all of the patient's preoperative diagnostic tests they are  felt to be candidate for transcatheter  aortic valve replacement using the transfemoral approach as an alternative to high risk conventional surgery.    Following the decision to proceed with transcatheter aortic valve replacement, a discussion has been held regarding what types of management strategies would be attempted intraoperatively in the event of life-threatening complications, including whether or not the patient would be considered a candidate for the use of cardiopulmonary bypass and/or conversion to open sternotomy for attempted surgical intervention.  The patient has been advised of a variety of complications that might develop peculiar to this approach including but not limited to risks of death, stroke, paravalvular leak, aortic dissection or other major vascular complications, aortic annulus rupture, device embolization, cardiac rupture or perforation, acute myocardial infarction, arrhythmia, heart block or bradycardia requiring permanent pacemaker placement, congestive heart failure, respiratory failure, renal failure, pneumonia, infection, other late complications related to structural valve deterioration or migration, or other complications that might ultimately cause a temporary or permanent loss of functional independence or other long term morbidity.  The patient provides full informed consent for the procedure as described and all questions were answered preoperatively.  DETAILS OF THE OPERATIVE PROCEDURE  PREPARATION:   The patient is brought to the operating room on the above mentioned date and central monitoring was established by the anesthesia team including placement of Swan-Ganz catheter and radial arterial line. The patient is placed in the supine position on the operating table.  Intravenous antibiotics are administered. General endotracheal anesthesia is induced uneventfully. A Foley catheter is placed.  Baseline transesophageal echocardiogram was performed. No LAA thrombus noted. The patient's chest, abdomen,  both groins, and both lower extremities are prepared and draped in a sterile manner. A time out procedure is performed.  PERIPHERAL ACCESS:   Using the modified Seldinger technique, femoral arterial and venous access were obtained with placement of 6 Fr sheaths on the left side.  A pigtail diagnostic catheter was passed through the femoral arterial sheath under fluoroscopic guidance into the aortic root.  A temporary transvenous pacemaker catheter was passed through the femoral venous sheath under fluoroscopic guidance into the right ventricle.  The pacemaker was tested to ensure stable lead placement and pacemaker capture. Aortic root angiography was performed in order to determine the optimal angiographic angle for valve deployment.  TRANSFEMORAL ACCESS:  A micropuncture kit was used to access the right femoral artery. J-wire used to place the 8 [place] sheath. Double ProGlide devices for pre-closure. The patient was heparinized systemically and ACT verified > 250 seconds.    A 14 Fr transfemoral E-sheath was introduced into the right femoral artery after progressively dilating over an Amplatz superstiff wire. An AL-1 catheter was used to direct a straight-tip exchange length wire across the native aortic valve into the left ventricle. This was exchanged out for a pigtail catheter and position was confirmed in the LV apex. Simultaneous LV and Ao pressures were recorded.  The pigtail catheter was then exchanged for an Amplatz Extra-stiff wire in the LV apex.   TRANSCATHETER HEART VALVE DEPLOYMENT:  An Edwards Sapien 3 THV (size 26 mm) was prepared and crimped per manufacturer's guidelines, and the proper orientation of the valve is confirmed on the [employer] delivery system. The valve was advanced through the introducer sheath using normal technique until in an appropriate position in the abdominal aorta beyond the sheath tip. The balloon was then retracted and using the fine-tuning wheel was  centered on the valve. The valve was then advanced across  the aortic arch using appropriate flexion of the catheter. The valve was carefully positioned across the aortic valve annulus. The Commander catheter was retracted using normal technique. Once final position of the valve has been confirmed by angiographic assessment, the valve is deployed while temporarily holding ventilation and during rapid ventricular pacing to maintain systolic blood pressure < 50 mmHg and pulse pressure < 10 mmHg. The balloon inflation is held for >3 seconds after reaching full deployment volume. Once the balloon has fully deflated the balloon is retracted into the ascending aorta and valve function is assessed using TEE. There is felt to be no paravalvular leak and no central aortic insufficiency.  The patient's hemodynamic recovery following valve deployment is good.  The deployment balloon and guidewire are both removed. Echo demostrated acceptable post-procedural gradients, stable mitral valve function, and no AI. Aortography confirmed no greater than mild AI.   PROCEDURE COMPLETION:  The sheath was then removed and the ProGlide sutures were completed. Protamine was administered once femoral arterial repair was complete. The pigtail catheter and arterial femoral sheath was removed with manual pressure used for hemostasis.   The patient tolerated the procedure well and is transported to the surgical intensive care in stable condition. There were no immediate intraoperative complications. All sponge instrument and needle counts are verified correct at completion of the operation.   No blood products were administered during the operation.  The patient received a total of 84 mL of intravenous contrast during the procedure.  Lauree Chandler MD 12/14/2016 9:58 AM

## 2016-12-14 NOTE — Op Note (Signed)
[facility]   MULTIDISCIPLINARY HEART VALVE TEAM   TAVR OPERATIVE NOTE   Date of Procedure:  12/14/2016  Preoperative Diagnosis: Severe Aortic Stenosis   Postoperative Diagnosis: Same   Procedure:    Transcatheter Aortic Valve Replacement - Percutaneous Right Transfemoral Approach  Edwards Sapien 3 THV (size 26 mm, model # [id number], serial # [id number])   Co-Surgeons: Gaye Pollack, MD and Lauree Chandler, MD    Anesthesiologist:  Annye Asa, MD  Echocardiographer:  Ena Dawley, MD  Pre-operative Echo Findings:   severe aortic stenosis   moderate aortic insufficiency   Normal left ventricular systolic function  Post-operative Echo Findings:  No paravalvular leak  normal left ventricular systolic function   BRIEF CLINICAL NOTE AND INDICATIONS FOR SURGERY  The patient is a 81 year old woman with a history of aortic stenosis, persistent atrial fibrillation on Xarelto, hypertension, OSA on CPAP, chronic diastolic heart failure and degenerative arthritis s/p bilateral hip replacements, left total knee replacement and right total shoulder replacement. She has been followed with serial echocardiograms by Dr. Radford Pax and an echo in 10/2015 showed moderate AS with a mean gradient of 32 mm Hg. Over the past several months she has developed progressive exertional shortness of breath. This does not occur with normal activities in her house but walking fast will bring it on and she has to stop to rest. She had not had any chest pain or pressure and no dizziness or syncope. Then on 11/17/2016 when she developed sudden onset of chest discomfort. . She took a sublingual nitroglycerin withsome relief but the pain did not resolve completely and it recurred in the early morning hours of 11/18/2016. She went to the ERwhere symptoms were again relieved by administration of nitroglycerin and morphine. Baseline EKG showedrate controlled atrial fibrillation without  significant ST segment changes. Initial troponin was negative andfollow-up troponin was slightly elevated to 0.06. The patient wasadmitted to the hospital and has remained pain-free ever since. Transthoracic echocardiogram on02/10/2016 showedsevere aortic stenosis with a mean gradient of 35 mm Hg, a peak of 57 mm Hg and a DVI of 0.27. There was moderate aortic insufficiency, mild mitral regurgitation and normal left ventricular systolic function with moderate left ventricular hypertrophy. Left and right heart catheterization was performed 11/19/2016 showing severeaortic stenosis with peak to peak and mean transvalvular gradients of29 and 21.7 mmHg respectively, corresponding to anaortic valve area calculated 0.8 cm. The patient was noted to have 70% focal ostial stenosis of the first diagonal branch, 30-40% stenosis of the proximal right coronary artery, and otherwise very mild nonobstructive coronary artery disease. There was mild pulmonary hypertension.   She has stage D, severe, symptomatic aortic stenosis with NYHA class II CHF symptoms of exertional shortness of breath and fatigue with moderate exertion. He symptoms have been progressing over the past 6 months and are now limiting her activity. She was recently admitted with an episode of chest pain that was somewhat atypical and she only had a 70% stenosis in a small diagonal on cath. I have personally reviewed her echo, cath and CT films. Echo shows a trileaflet aortic valve with severe calcification, thickening and restricted leaflet mobility. The mean gradient is 35 mm Hg with a peak velocity ratio of 0.27. There is moderate AI and she has atrial fibrillation which make assessment of the gradient more difficult. It certainly looks like a severely stenotic valve. Her cath shows no significant coronary disease with only a 70% diagonal stenosis. I agree that AVR is  indicated in this patient for relief of her symptoms and to improve her quality of  life and long term prognosis. Her risk for open surgical AVR would be moderately elevated due to her age and multiple co-morbid factors including chronic persistent atrial fib, multiple joint replacements, OSA. I think TAVR would be a reasonable alternative for her with a quicker recovery and less overall risk. Her gated cardiac CT shows anatomy favorable for a 26 mm Sapien 3 valve. Her abdominal and pelvic CT shows suitable arterial anatomy for a transfemoral approach.   The patient and her husband werecounseled at length regarding treatment alternatives for management of severe symptomatic aortic stenosis. The risks and benefits of surgical intervention has been discussed in detail. Long-term prognosis with medical therapy was discussed. Alternative approaches such as conventional surgical aortic valve replacement, transcatheter aortic valve replacement, and palliative medical therapy were compared and contrasted at length. This discussion was placed in the context of the patient's own specific clinical presentation and past medical history. All of their questions been addressed. The patient is eager to proceed with Aleda Grana soon as possible.   Following the decision to proceed with transcatheter aortic valve replacement, a discussion was held regarding what types of management strategies would be attempted intraoperatively in the event of life-threatening complications, including whether or not the patient would be considered a candidate for the use of cardiopulmonary bypass and/or conversion to open sternotomy for attempted surgical intervention. The patient is aware of the fact that transient use of cardiopulmonary bypass may be necessary, but the patient specifically states that she would not wish to undergo redo median sternotomy under any circumstances, even if she were to develop potentially lethal complications related to transcatheter valves appointment.  The patient has been advised of a variety  of complications that might develop including but not limited to risks of death, stroke, paravalvular leak, aortic dissection or other major vascular complications, aortic annulus rupture, device embolization, cardiac rupture or perforation, mitral regurgitation, acute myocardial infarction, arrhythmia, heart block or bradycardia requiring permanent pacemaker placement, congestive heart failure, respiratory failure, renal failure, pneumonia, infection, other late complications related to structural valve deterioration or migration, or other complications that might ultimately cause a temporary or permanent loss of functional independence or other long term morbidity. The patient provides full informed consent for the procedure as described and all questions were answered.     DETAILS OF THE OPERATIVE PROCEDURE  PREPARATION:    The patient is brought to the operating room on the above mentioned date and central monitoring was established by the anesthesia team including placement of Swan-Ganz catheter and radial arterial line. The patient is placed in the supine position on the operating table.  Intravenous antibiotics are administered.  General endotracheal anesthesia is induced uneventfully.  A Foley catheter is placed.  Baseline transesophageal echocardiogram was performed. The patient's chest, abdomen, both groins, and both lower extremities are prepared and draped in a sterile manner. A time out procedure is performed.   Peripheral and transfemoral access were performed by Dr. Angelena Form and will be included in his note.   BALLOON AORTIC VALVULOPLASTY:   Not performed   TRANSCATHETER HEART VALVE DEPLOYMENT:   An Edwards Sapien 3 transcatheter heart valve (size 26 mm, model #[id number], serial [id number]) was prepared and crimped per manufacturer's guidelines, and the proper orientation of the valve is confirmed on the [employer] delivery system. The valve was advanced through the  introducer sheath using normal technique until in an appropriate position  in the abdominal aorta beyond the sheath tip. The balloon was then retracted and using the fine-tuning wheel was centered on the valve. The valve was then advanced across the aortic arch using appropriate flexion of the catheter. The valve was carefully positioned across the aortic valve annulus. The Commander catheter was retracted using normal technique. Once final position of the valve has been confirmed by angiographic assessment, the valve is deployed while temporarily holding ventilation and during rapid ventricular pacing to maintain systolic blood pressure < 50 mmHg and pulse pressure < 10 mmHg. The balloon inflation is held for >3 seconds after reaching full deployment volume. Once the balloon has fully deflated the balloon is retracted into the ascending aorta and valve function is assessed using echocardiography. There is felt to be no paravalvular leak and no central aortic insufficiency.  The patient's hemodynamic recovery following valve deployment is good.  The deployment balloon and guidewire are both removed. Echo demostrated acceptable post-procedural gradients, stable mitral valve function, and no aortic insufficiency.   PROCEDURE COMPLETION:   The sheath was removed and femoral artery closure performed by Dr Angelena Form. Please see his separate report for details.  Protamine was administered once femoral arterial repair was complete. The temporary pacemaker catheter was left in place due to marked bradycardia with chronic atrial fibrillation. The pigtail catheter and femoral sheaths were removed with manual pressure used for hemostasis.   The patient tolerated the procedure well and is transported to the surgical intensive care in stable condition. There were no immediate intraoperative complications. All sponge instrument and needle counts are verified correct at completion of the operation.   No blood products were  administered during the operation.  The patient received a total of 84 mL of intravenous contrast during the procedure.   Gaye Pollack, MD 12/14/2016 11:16 AM

## 2016-12-14 NOTE — Interval H&P Note (Signed)
History and Physical Interval Note:  12/14/2016 5:44 AM  [name]  has presented today for surgery, with the diagnosis of SEVERE AS  The various methods of treatment have been discussed with the patient and family. After consideration of risks, benefits and other options for treatment, the patient has consented to  Procedure(s): TRANSCATHETER AORTIC VALVE REPLACEMENT, TRANSFEMORAL (N/A) TRANSESOPHAGEAL ECHOCARDIOGRAM (TEE) (N/A) as a surgical intervention .  The patient's history has been reviewed, patient examined, no change in status, stable for surgery.  I have reviewed the patient's chart and labs.  Questions were answered to the patient's satisfaction.     Gaye Pollack

## 2016-12-14 NOTE — Progress Notes (Signed)
Per PA Katie hold 1800 Cardizem, okay to discontinue temp pacer with venous sheath, will contact 4N or cath lab to remove, will continue to monitor.   [name] 6:41 PM

## 2016-12-14 NOTE — Progress Notes (Signed)
CT surgery p.m. Rounds  Resting comfortably Left groin sheath just removed No evidence of hematoma Hemodynamics stable

## 2016-12-14 NOTE — Care Management Note (Addendum)
Case Management Note  Patient Details  Name: [name] MRN: [id number] Date of Birth: 1936-05-10  Subjective/Objective:    From home, spouse , Simona Huh, she has pcp, Dr. Maurice Small, she has medication coverage and transportation at dc, pta indep with cane and walker and bsc at home from previous surgery.  S/p TAVR,  On 4 liters,  Does not have home oxygen. NCM will cont to follow for dc needs.                 Action/Plan:   Expected Discharge Date:                  Expected Discharge Plan:     In-House Referral:     Discharge planning Services  CM Consult  Post Acute Care Choice:    Choice offered to:     DME Arranged:    DME Agency:     HH Arranged:    HH Agency:     Status of Service:  In process, will continue to follow  If discussed at Long Length of Stay Meetings, dates discussed:    Additional Comments:  Zenon Mayo, RN 12/14/2016, 5:33 PM

## 2016-12-15 ENCOUNTER — Inpatient Hospital Stay (HOSPITAL_COMMUNITY): Payer: Medicare Other

## 2016-12-15 ENCOUNTER — Encounter (HOSPITAL_COMMUNITY): Payer: Self-pay | Admitting: Cardiovascular Disease

## 2016-12-15 DIAGNOSIS — I361 Nonrheumatic tricuspid (valve) insufficiency: Secondary | ICD-10-CM

## 2016-12-15 DIAGNOSIS — I350 Nonrheumatic aortic (valve) stenosis: Secondary | ICD-10-CM

## 2016-12-15 DIAGNOSIS — Z954 Presence of other heart-valve replacement: Secondary | ICD-10-CM

## 2016-12-15 DIAGNOSIS — I481 Persistent atrial fibrillation: Secondary | ICD-10-CM

## 2016-12-15 DIAGNOSIS — I272 Pulmonary hypertension, unspecified: Secondary | ICD-10-CM | POA: Insufficient documentation

## 2016-12-15 LAB — BASIC METABOLIC PANEL
Anion gap: 9 (ref 5–15)
BUN: 13 mg/dL (ref 6–20)
CHLORIDE: 103 mmol/L (ref 101–111)
CO2: 28 mmol/L (ref 22–32)
Calcium: 9.7 mg/dL (ref 8.9–10.3)
Creatinine, Ser: 0.88 mg/dL (ref 0.44–1.00)
GFR calc non Af Amer: >60 mL/min (ref >60)
Glucose, Bld: 96 mg/dL (ref 65–99)
POTASSIUM: 4.1 mmol/L (ref 3.5–5.1)
SODIUM: 140 mmol/L (ref 135–145)

## 2016-12-15 LAB — CBC
HCT: 38.4 % (ref 36.0–46.0)
HEMOGLOBIN: 12.9 g/dL (ref 12.0–15.0)
MCH: 31.5 pg (ref 26.0–34.0)
MCHC: 33.6 g/dL (ref 30.0–36.0)
MCV: 93.7 fL (ref 78.0–100.0)
Platelets: 171 10*3/uL (ref 150–400)
RBC: 4.1 MIL/uL (ref 3.87–5.11)
RDW: 13.1 % (ref 11.5–15.5)
WBC: 9.1 10*3/uL (ref 4.0–10.5)

## 2016-12-15 LAB — GLUCOSE, CAPILLARY: GLUCOSE-CAPILLARY: 85 mg/dL (ref 65–99)

## 2016-12-15 LAB — MAGNESIUM: MAGNESIUM: 1.8 mg/dL (ref 1.7–2.4)

## 2016-12-15 MED ORDER — DILTIAZEM HCL ER COATED BEADS 180 MG PO CP24
360.0000 mg | ORAL_CAPSULE | Freq: Every day | ORAL | Status: DC
  Administered 2016-12-15 – 2016-12-16 (×2): 360 mg via ORAL
  Filled 2016-12-15: qty 2
  Filled 2016-12-15: qty 1

## 2016-12-15 MED FILL — Potassium Chloride Inj 2 mEq/ML: INTRAVENOUS | Qty: 40 | Status: AC

## 2016-12-15 MED FILL — Insulin Regular (Human) Inj 100 Unit/ML: INTRAMUSCULAR | Qty: 250 | Status: AC

## 2016-12-15 MED FILL — Phenylephrine HCl Inj 10 MG/ML: INTRAMUSCULAR | Qty: 1 | Status: AC

## 2016-12-15 MED FILL — Heparin Sodium (Porcine) Inj 1000 Unit/ML: INTRAMUSCULAR | Qty: 30 | Status: AC

## 2016-12-15 MED FILL — Sodium Chloride IV Soln 0.9%: INTRAVENOUS | Qty: 250 | Status: AC

## 2016-12-15 MED FILL — Magnesium Sulfate Inj 50%: INTRAMUSCULAR | Qty: 10 | Status: AC

## 2016-12-15 NOTE — Progress Notes (Signed)
1 Day Post-Op Procedure(s) (LRB): TRANSCATHETER AORTIC VALVE REPLACEMENT, TRANSFEMORAL (N/A) TRANSESOPHAGEAL ECHOCARDIOGRAM (TEE) (N/A) Subjective:  No complaints  Objective: Vital signs in last 24 hours: Temp:  [96.2 F (35.7 C)-99.3 F (37.4 C)] 99.3 F (37.4 C) (02/28 1100) Pulse Rate:  [38-123] 103 (02/28 0935) Cardiac Rhythm: Atrial fibrillation (02/28 0800) Resp:  [12-23] 18 (02/28 0935) BP: (114-180)/(67-119) 129/72 (02/28 0935) SpO2:  [94 %-100 %] 99 % (02/28 0935) Arterial Line BP: (186-212)/(70-85) 207/79 (02/27 1330) Weight:  [180 lb 8.9 oz (81.9 kg)] 180 lb 8.9 oz (81.9 kg) (02/28 0500)  Hemodynamic parameters for last 24 hours:    Intake/Output from previous day: 02/27 0701 - 02/28 0700 In: 1694.1 [P.O.:180; I.V.:964.1; IV Piggyback:550] Out: [id number]; Blood:25] Intake/Output this shift: Total I/O In: 252 [P.O.:240; I.V.:12] Out: 30 [Urine:30]  General appearance: alert and cooperative Neurologic: intact Heart: regular rate and rhythm, S1, S2 normal, no murmur, click, rub or gallop Lungs: clear to auscultation bilaterally Extremities: extremities normal, atraumatic, no cyanosis or edema Wound: groin sites look good.  Lab Results:  Recent Labs  12/14/16 1030 12/14/16 1034 12/15/16 0400  WBC 7.2  --  9.1  HGB 11.4* 10.9* 12.9  HCT 34.2* 32.0* 38.4  PLT 142*  --  171   BMET:  Recent Labs  12/14/16 0936 12/14/16 1034 12/15/16 0400  NA 143 143 140  K 3.6 3.8 4.1  CL 107  --  103  CO2  --   --  28  GLUCOSE 107* 111* 96  BUN 14  --  13  CREATININE 0.80  --  0.88  CALCIUM  --   --  9.7    PT/INR:  Recent Labs  12/14/16 1030  LABPROT 16.5*  INR 1.32   ABG    Component Value Date/Time   PHART 7.424 12/14/2016 1030   HCO3 25.8 12/14/2016 1030   TCO2 27 12/14/2016 1030   O2SAT 99.0 12/14/2016 1030   CBG (last 3)   Recent Labs  12/15/16 1137  GLUCAP 85   CLINICAL DATA:  Post transcatheter aortic valve  replacement.  EXAM: PORTABLE CHEST 1 VIEW  COMPARISON:  12/14/2016  FINDINGS: Cardiomegaly again noted. Again noted transcatheter aortic valve replacement. No infiltrate or pulmonary edema. Right IJ central line with tip in distal SVC. No pneumothorax. Mild left basilar atelectasis.  IMPRESSION: Again noted status post transcatheter aortic valve replacement. Cardiomegaly. No infiltrate or pulmonary edema. Mild left basilar atelectasis.   Electronically Signed   By: Lahoma Crocker M.D.   On: 12/15/2016 07:58  Assessment/Plan: S/P Procedure(s) (LRB): TRANSCATHETER AORTIC VALVE REPLACEMENT, TRANSFEMORAL (N/A) TRANSESOPHAGEAL ECHOCARDIOGRAM (TEE) (N/A)  She is doing well POD 1 after TAVR. 2D echo today.  Rate-controlled persistent atrial fib. Some bradycardia initially postop but HR 90's this am. Resume Cardizem. Start Xarelto back tomorrow.  Transfer to 2W.   LOS: 1 day    Gaye Pollack 12/15/2016

## 2016-12-15 NOTE — Progress Notes (Signed)
CARDIAC REHAB PHASE I   PRE:  Rate/Rhythm: 70 afib  BP:  Supine:   Sitting: 143/55  Standing:    SaO2: 98%RA  MODE:  Ambulation: 550 ft   POST:  Rate/Rhythm: 78 afib  BP:  Supine:   Sitting: 142/52  Standing:    SaO2: 96%RA 1430-1450 Pt walked to bathroom independently with fast steady gait. Pt walked 550 ft with minimal asst with steady gait. Tolerated well. No complaints. To bed after walk.   Graylon Good, RN BSN  12/15/2016 2:47 PM

## 2016-12-15 NOTE — Progress Notes (Signed)
Report given to [place], [place]  Sherrie Mustache 12:50 PM

## 2016-12-15 NOTE — Progress Notes (Signed)
     SUBJECTIVE: No chest pain or dyspnea. No events overnight. HR stable this am.   Tele: atrial fib, rate 90s  BP 130/77   Pulse 84   Temp 98.5 F (36.9 C) (Oral)   Resp 18   Ht 5\' 7"  (1.702 m)   Wt 180 lb 8.9 oz (81.9 kg)   SpO2 95%   BMI 28.28 kg/m   Intake/Output Summary (Last 24 hours) at 12/15/16 0703 Last data filed at 12/15/16 0600  Gross per 24 hour  Intake           1682.1 ml  Output             5670 ml  Net          -3987.9 ml    PHYSICAL EXAM General: Well developed, well nourished, in no acute distress. Alert and oriented x 3.  Psych:  Good affect, responds appropriately Neck: No JVD. No masses noted.  Lungs: Clear bilaterally with no wheezes or rhonci noted.  Heart: Irreg irreg with no murmurs noted. Abdomen: Bowel sounds are present. Soft, non-tender.  Extremities: No lower extremity edema. Bilateral groin sites stable.   LABS: Basic Metabolic Panel:  Recent Labs  12/14/16 0936 12/14/16 1034 12/15/16 0400  NA 143 143 140  K 3.6 3.8 4.1  CL 107  --  103  CO2  --   --  28  GLUCOSE 107* 111* 96  BUN 14  --  13  CREATININE 0.80  --  0.88  CALCIUM  --   --  9.7  MG  --   --  1.8   CBC:  Recent Labs  12/14/16 1030 12/14/16 1034 12/15/16 0400  WBC 7.2  --  9.1  HGB 11.4* 10.9* 12.9  HCT 34.2* 32.0* 38.4  MCV 93.2  --  93.7  PLT 142*  --  171   Current Meds: . acetaminophen  1,000 mg Oral Q6H   Or  . acetaminophen (TYLENOL) oral liquid 160 mg/5 mL  1,000 mg Per Tube Q6H  . acetaminophen (TYLENOL) oral liquid 160 mg/5 mL  650 mg Per Tube Once   Or  . acetaminophen  650 mg Rectal Once  . aspirin EC  325 mg Oral Daily   Or  . aspirin  324 mg Per Tube Daily  . atorvastatin  20 mg Oral q1800  . calcium-vitamin D  1 tablet Oral Q breakfast  . cefUROXime (ZINACEF)  IV  1.5 g Intravenous Q12H  . Chlorhexidine Gluconate Cloth  6 each Topical Daily  . diltiazem  360 mg Oral QPM  . famotidine (PEPCID) IV  20 mg Intravenous Q12H  .  losartan  50 mg Oral Daily  . Melatonin  9 mg Oral QHS  . mupirocin ointment  1 application Nasal BID  . pantoprazole  40 mg Oral Daily  . predniSONE  5 mg Oral Q breakfast     ASSESSMENT AND PLAN:  1. Severe aortic stenosis: She is now POD #1 s/p TAVR. She is doing well this am. Will continue ASA today. Will restart Xarelto tomorrow if groin sites are stable. Will plan echo later today to assess valve. Remove central line and foley catheter. Transfer to telemetry unit.   2. Atrial fibrillation, persistent: Rate controlled this am but rates in 90s with Cardizem held overnight. Will restart home dosage of Cardizem now.    [name]  2/28/20187:03 AM

## 2016-12-16 ENCOUNTER — Telehealth: Payer: Self-pay | Admitting: Cardiovascular Disease

## 2016-12-16 ENCOUNTER — Encounter (HOSPITAL_COMMUNITY): Payer: Self-pay | Admitting: Cardiology

## 2016-12-16 ENCOUNTER — Other Ambulatory Visit: Payer: Self-pay | Admitting: Cardiology

## 2016-12-16 DIAGNOSIS — Z953 Presence of xenogenic heart valve: Secondary | ICD-10-CM

## 2016-12-16 DIAGNOSIS — I2510 Atherosclerotic heart disease of native coronary artery without angina pectoris: Secondary | ICD-10-CM

## 2016-12-16 DIAGNOSIS — I350 Nonrheumatic aortic (valve) stenosis: Secondary | ICD-10-CM

## 2016-12-16 HISTORY — DX: Atherosclerotic heart disease of native coronary artery without angina pectoris: I25.10

## 2016-12-16 LAB — BASIC METABOLIC PANEL
Anion gap: 8 (ref 5–15)
BUN: 27 mg/dL — ABNORMAL HIGH (ref 6–20)
CO2: 26 mmol/L (ref 22–32)
CREATININE: 1.12 mg/dL — AB (ref 0.44–1.00)
Calcium: 9.5 mg/dL (ref 8.9–10.3)
Chloride: 107 mmol/L (ref 101–111)
GFR calc non Af Amer: 46 mL/min — ABNORMAL LOW (ref >60)
GFR, EST AFRICAN AMERICAN: 53 mL/min — AB (ref >60)
Glucose, Bld: 90 mg/dL (ref 65–99)
POTASSIUM: 3.7 mmol/L (ref 3.5–5.1)
SODIUM: 141 mmol/L (ref 135–145)

## 2016-12-16 LAB — CBC
HCT: 39.3 % (ref 36.0–46.0)
Hemoglobin: 13.1 g/dL (ref 12.0–15.0)
MCH: 31.1 pg (ref 26.0–34.0)
MCHC: 33.3 g/dL (ref 30.0–36.0)
MCV: 93.3 fL (ref 78.0–100.0)
Platelets: 150 10*3/uL (ref 150–400)
RBC: 4.21 MIL/uL (ref 3.87–5.11)
RDW: 13.3 % (ref 11.5–15.5)
WBC: 7.8 10*3/uL (ref 4.0–10.5)

## 2016-12-16 MED ORDER — RIVAROXABAN 20 MG PO TABS
20.0000 mg | ORAL_TABLET | Freq: Every day | ORAL | Status: DC
  Administered 2016-12-16: 20 mg via ORAL
  Filled 2016-12-16: qty 1

## 2016-12-16 MED ORDER — ACETAMINOPHEN 325 MG PO TABS: 650.0000 mg | ORAL_TABLET | ORAL | Status: DC | PRN

## 2016-12-16 NOTE — Progress Notes (Signed)
SUBJECTIVE: No chest pain or dyspnea. No leg pain  Tele: atrial fib  BP (!) 141/67 (BP Location: Right Arm)   Pulse (!) 57   Temp 98.4 F (36.9 C) (Oral)   Resp 18   Ht 5\' 7"  (1.702 m)   Wt 179 lb 6.4 oz (81.4 kg)   SpO2 93%   BMI 28.10 kg/m   Intake/Output Summary (Last 24 hours) at 12/16/16 0544 Last data filed at 12/15/16 1635  Gross per 24 hour  Intake              576 ml  Output              135 ml  Net              441 ml    PHYSICAL EXAM General: Well developed, well nourished, in no acute distress. Alert and oriented x 3.  Psych:  Good affect, responds appropriately Neck: No JVD. No masses noted.  Lungs: Clear bilaterally with no wheezes or rhonci noted.  Heart: Irreg irreg with no murmurs noted. Abdomen: Bowel sounds are present. Soft, non-tender.  Extremities: No lower extremity edema.   LABS: Basic Metabolic Panel:  Recent Labs  12/15/16 0400 12/16/16 0252  NA 140 141  K 4.1 3.7  CL 103 107  CO2 28 26  GLUCOSE 96 90  BUN 13 27*  CREATININE 0.88 1.12*  CALCIUM 9.7 9.5  MG 1.8  --    CBC:  Recent Labs  12/15/16 0400 12/16/16 0252  WBC 9.1 7.8  HGB 12.9 13.1  HCT 38.4 39.3  MCV 93.7 93.3  PLT 171 150    Current Meds: . aspirin EC  325 mg Oral Daily  . atorvastatin  20 mg Oral q1800  . calcium-vitamin D  1 tablet Oral Q breakfast  . diltiazem  360 mg Oral Daily  . losartan  50 mg Oral Daily  . Melatonin  9 mg Oral QHS  . mupirocin ointment  1 application Nasal BID  . pantoprazole  40 mg Oral Daily  . predniSONE  5 mg Oral Q breakfast   Echo 12/15/16 Left ventricle: The cavity size was normal. Wall thickness was   increased in a pattern of moderate LVH. Systolic function was   normal. The estimated ejection fraction was in the range of 60%   to 65%. Wall motion was normal; there were no regional wall   motion abnormalities. The study is not technically sufficient to   allow evaluation of LV diastolic function. - Aortic  valve: s/p TAVR valve. No paravalvular leak. Mean gradient   (S): 13 mm Hg. Peak gradient (S): 26 mm Hg. - Mitral valve: Mildly thickened leaflets . There was trivial   regurgitation. - Left atrium: Severely dilated. - Right atrium: Severely dilated. - Tricuspid valve: There was mild regurgitation. - Pulmonary arteries: PA peak pressure: 39 mm Hg (S). - Systemic veins: Not visualized.  Impressions:  - Compared to a recent echo, there is now a TAVR valve in place   with improved gradients and no paravalvular leak. LVEF is stable   at 60-65%.  ASSESSMENT AND PLAN: 81 yo female with history of atrial fib, severe AS admitted for TAVR.   1. Severe aortic stenosis: She is now POD #2 s/p TAVR. She is doing well this am. Echo yesterday with normal LV systolic function, normally functioning bioprosthetic aortic valve. Will stop ASA today. Will restart Xarelto today.    2. Atrial fibrillation,  persistent: Rate controlled this am on home dose of Cardizem. Restart Xarelto today.   Discharge home today. She will need f/u in our office with office APP in 1 week and f/u with me in one month with echo the day of her visit with me.        Lauree Chandler  3/1/20185:44 AM

## 2016-12-16 NOTE — Care Management Note (Signed)
Case Management Note  Patient Details  Name: [name] MRN: [id number] Date of Birth: [birth year]  Subjective/Objective:                 Independent patient from home with spouse. Anticipate DC today, no CM needs identified.   Action/Plan:   Expected Discharge Date:                  Expected Discharge Plan:  Home/Self Care  In-House Referral:     Discharge planning Services  CM Consult  Post Acute Care Choice:    Choice offered to:     DME Arranged:    DME Agency:     HH Arranged:    HH Agency:     Status of Service:  Completed, signed off  If discussed at [employer] of Stay Meetings, dates discussed:    Additional Comments:  Carles Collet, RN 12/16/2016, 9:55 AM

## 2016-12-16 NOTE — Progress Notes (Signed)
Patient verbalized understanding of discharge instructions and was able to teach back the information to Rn. Patient's iv and telemetry monitor removed. Patient has already called her spouse, who will be picking her up from the hospital. Patient will be taken downstairs by wheelchair once patient's husband arrives.

## 2016-12-16 NOTE — Telephone Encounter (Signed)
New message      TCM appt on 12-23-16 with Estella Husk per Cecilie Kicks.

## 2016-12-16 NOTE — Discharge Summary (Signed)
Discharge Summary    Patient ID: [name],  MRN: [id number], DOB/AGE: [id number] 79 y.o.  Admit date: 12/14/2016 Discharge date: 12/16/2016  Primary Care Provider: Maurice Small D Primary Cardiologist: Dr. Radford Pax TAVR MD Dr. Angelena Form  Discharge Diagnoses    Principal Problem:   Severe aortic stenosis Active Problems:   S/p TAVR (transcatheter aortic valve replacement), bioprosthetic 12/14/16   HTN (hypertension)   Permanent atrial fibrillation (HCC)   Chronic diastolic congestive heart failure ([place])   CAD in native artery, non obstructive cath 11/2016.    Allergies Allergies  Allergen Reactions  . Beta Adrenergic Blockers Shortness Of Breath  . Betapace [Sotalol Hcl] Shortness Of Breath  . Other     Narcotic pain medications - anxiety attacks  . Oxycodone Shortness Of Breath    Tolerates Dilaudid, Fentanyl  . Clonidine Derivatives Palpitations    bradycardia  . Crestor [Rosuvastatin Calcium] Other (See Comments) and Cough    MYALGIAS  . Adhesive [Tape] Rash     bandaids   . Latex Rash    Per patient-bandaids- no problems with gloves  . Tramadol Anxiety    Diagnostic Studies/Procedures    12/14/16 Procedure:        Transcatheter Aortic Valve Replacement - Transfemoral Approach             Edwards Sapien 3 THV (size 26 mm, model # [id number], serial # [id number])              Co-Surgeons:                        Gaye Pollack, MD and Lauree Chandler, MD  Anesthesiologist:                  Glennon Mac  Echocardiographer:              Meda Coffee  Pre-operative Echo Findings: ? Severe aortic stenosis ? Normal left ventricular systolic function  Post-operative Echo Findings: ? No paravalvular leak ? Normal left ventricular systolic function ?  _____________  Echo 12/15/16 Left ventricle: The cavity size was normal. Wall thickness was increased in a pattern of moderate LVH. Systolic function was normal. The estimated ejection fraction was in  the range of 60% to 65%. Wall motion was normal; there were no regional wall motion abnormalities. The study is not technically sufficient to allow evaluation of LV diastolic function. - Aortic valve: s/p TAVR valve. No paravalvular leak. Mean gradient (S): 13 mm Hg. Peak gradient (S): 26 mm Hg. - Mitral valve: Mildly thickened leaflets . There was trivial regurgitation. - Left atrium: Severely dilated. - Right atrium: Severely dilated. - Tricuspid valve: There was mild regurgitation. - Pulmonary arteries: PA peak pressure: 39 mm Hg (S). - Systemic veins: Not visualized.  Impressions:  - Compared to a recent echo, there is now a TAVR valve in place with improved gradients and no paravalvular leak. LVEF is stable at 60-65%.  History of Present Illness     81 yo female well-known to our service with a PMH significant for chronic Afib (on xarelto), HTN, chronic diastolic heart failure, aortic stenosis, HLD, obesity, GERD, and breast cancer. She presented to the ED 11/17/16 for sudden onset chest pain. Cardiology was consulted for ACS workup.  She was found to have severe aortic stenosis- stage D, with NYHA class II CHF symptoms.  CAD consisting of 70% focal excentric ostial narrowing of 1st diag branch of LAD and superior takeoff  dominant RCA,  with mild 30-40% very proximal stenosis eccentrically.  Normal ramus intermediate and left circumflex coronary arteries. She has chronic a fib. On Xarelto with CHA2DS2VASC score of 5.   Her symptoms were also exertional SOB and fatigue.   Pt completed work up with TAVR and presented to [facility] for TAVR on 12/14/16.    Hospital Course     Consultants: Dr. Cyndia Bent.  Pt underwent procedure without complications and post op echo findings with no paravalvular leak and normal LV systolic function.  T. Pacer removed evening of 12/14/16 without issues.  POD #1 her central line and foley cath removed, hemodynamically stable and transferred to  telemetry unit.   Pt was ambulating without problems.  POD#2 pt found stable by Dr. Angelena Form without chest pain, her HR in a fib is stable.  She is to begin xarelto today with meal.  Her echo with normal LV systolic function and normally functioning bioprosthetic aortic valve.  ASA stopped, and xarelto begun today.  Her chronic a fib is rate controlled on her home cardizem.  _____________  Discharge Vitals Blood pressure (!) 141/67, pulse (!) 57, temperature 98.4 F (36.9 C), temperature source Oral, resp. rate 18, height 5\' 7"  (1.702 m), weight 179 lb 6.4 oz (81.4 kg), SpO2 93 %.  Filed Weights   12/14/16 0618 12/15/16 0500 12/16/16 0357  Weight: 181 lb (82.1 kg) 180 lb 8.9 oz (81.9 kg) 179 lb 6.4 oz (81.4 kg)    Labs & Radiologic Studies    CBC  Recent Labs  12/15/16 0400 12/16/16 0252  WBC 9.1 7.8  HGB 12.9 13.1  HCT 38.4 39.3  MCV 93.7 93.3  PLT 171 [id number]   Basic Metabolic Panel  Recent Labs  12/15/16 0400 12/16/16 0252  NA 140 141  K 4.1 3.7  CL 103 107  CO2 28 26  GLUCOSE 96 90  BUN 13 27*  CREATININE 0.88 1.12*  CALCIUM 9.7 9.5  MG 1.8  --    Liver Function Tests No results for input(s): AST, ALT, ALKPHOS, BILITOT, PROT, ALBUMIN in the last 72 hours. No results for input(s): LIPASE, AMYLASE in the last 72 hours. Cardiac Enzymes No results for input(s): CKTOTAL, CKMB, CKMBINDEX, TROPONINI in the last 72 hours. BNP Invalid input(s): POCBNP D-Dimer No results for input(s): DDIMER in the last 72 hours. Hemoglobin A1C No results for input(s): HGBA1C in the last 72 hours. Fasting Lipid Panel No results for input(s): CHOL, HDL, LDLCALC, TRIG, CHOLHDL, LDLDIRECT in the last 72 hours. Thyroid Function Tests No results for input(s): TSH, T4TOTAL, T3FREE, THYROIDAB in the last 72 hours.  Invalid input(s): FREET3 _____________  Dg Chest 2 View  Result Date: 12/10/2016 CLINICAL DATA:  Preoperative evaluation. Aortic stenosis. Hypertension. EXAM: CHEST  2 VIEW  COMPARISON:  Chest radiograph November 18, 2016 and chest CT November 29, 2016 FINDINGS: There is no edema or consolidation. Heart is mildly enlarged with pulmonary vascularity within normal limits. There is atherosclerotic calcification in the aorta. No adenopathy. Patient is status post total shoulder replacement on the right. There are surgical clips in the lower left chest laterally. IMPRESSION: No edema or consolidation. Stable cardiac prominence. Aortic atherosclerosis. Electronically Signed   By: Lowella Grip III M.D.   On: 12/10/2016 14:05   Dg Chest 2 View  Result Date: 11/18/2016 CLINICAL DATA:  Mid chest pain tonight. EXAM: CHEST  2 VIEW COMPARISON:  08/16/2012 FINDINGS: Stable cardiomegaly. There is atherosclerosis of the thoracic aorta. No pulmonary edema, focal airspace opacity  or pleural fluid. No pneumothorax. Surgical clips in the left breast/ axilla. Right humeral prosthesis. No acute osseous abnormality. IMPRESSION: No acute abnormality.  Stable cardiomegaly. Aortic atherosclerosis. Electronically Signed   By: Jeb Levering M.D.   On: 11/18/2016 03:23   Ct Coronary Morph W/cta Cor W/score W/ca W/cm &/or Wo/cm  Addendum Date: 11/30/2016   ADDENDUM REPORT: 11/30/2016 12:39 CLINICAL DATA:  81 year old with severe aortic stenosis. EXAM: Cardiac TAVR CT TECHNIQUE: The patient was scanned on a Philips 256 scanner. A 120 kV retrospective scan was triggered in the descending thoracic aorta at 111 HU's. Gantry rotation speed was 270 msecs and collimation was .9 mm. 5 mg of iv Metoprolol and no nitro were given. The 3D data set was reconstructed in 5% intervals of the R-R cycle. Systolic and diastolic phases were analyzed on a dedicated work station using MPR, MIP and VRT modes. The patient received 80 cc of contrast. FINDINGS: Aortic Valve: Severely calcified and thickened aortic valve with severely restricted leaflet opening. Minimal calcifications in the LVOT. Aorta:  Normal size, moderate  diffuse calcifications. Sinotubular Junction:  30 x 28 mm Ascending Thoracic Aorta:  34 x 33 mm Aortic Arch:  30 x 28 mm Descending Thoracic Aorta:  25 x 24 mm Sinus of Valsalva Measurements: Non-coronary:  28 mm Right -coronary:  29 mm Left -coronary:  32 mm Coronary Artery Height above Annulus: Left Main:  12 mm Right Coronary:  15 mm Virtual Basal Annulus Measurements: Maximum/Minimum Diameter:  25 x 22 mm Perimeter:  88 mm Area:  443 mm2 Coronary Arteries:  Normal origin. Optimum Fluoroscopic Angle for Delivery:  LAO 9 CAU 8 Severely dilated left atrium with a filling defect at the most superior and anterior portion of the left atrial appendage. Dilated pulmonary artery measuring 33 x 31 mm suggestive of pulmonary hypertension. IMPRESSION: 1. Severely calcified and thickened aortic valve with severely restricted leaflet opening and minimal calcifications in the LVOT. Annular measurements suitable 2. There is sufficient annulus to coronary distance. 3. Optimum Fluoroscopic Angle for Delivery:  LAO 10 CAU 8 4. Severely dilated left atrium with a filling defect at the most superior and anterior portion of the left atrial appendage. This most probably represents an insufficient contrast filling during the acquisition, however a correlation with an intraoperative TEE is recommended. 5. Dilated pulmonary artery measuring 33 x 31 mm suggestive of pulmonary hypertension. Ena Dawley Electronically Signed   By: Ena Dawley   On: 11/30/2016 12:39   Result Date: 11/30/2016 EXAM: OVER-READ INTERPRETATION  CT CHEST The following report is an over-read performed by radiologist Dr. Rebekah Chesterfield [facility] on 11/29/2016. This over-read does not include interpretation of cardiac or coronary anatomy or pathology. The coronary calcium score/coronary CTA interpretation by the cardiologist is attached. COMPARISON:  Chest CT 03/07/2011. FINDINGS: Full discussion of relevant extracardiac findings will be  rendered on dictation for contemporaneously obtained CTA of the chest, abdomen and pelvis 11/29/2016. IMPRESSION: Please see separate dictation for contemporaneously obtained CTA of the chest, abdomen and pelvis dated 11/29/2016 for full description of extracardiac findings. Electronically Signed: By: Vinnie Langton M.D. On: 11/29/2016 12:52   Dg Chest Port 1 View  Result Date: 12/15/2016 CLINICAL DATA:  Post transcatheter aortic valve replacement. EXAM: PORTABLE CHEST 1 VIEW COMPARISON:  12/14/2016 FINDINGS: Cardiomegaly again noted. Again noted transcatheter aortic valve replacement. No infiltrate or pulmonary edema. Right IJ central line with tip in distal SVC. No pneumothorax. Mild left basilar atelectasis. IMPRESSION: Again noted status  post transcatheter aortic valve replacement. Cardiomegaly. No infiltrate or pulmonary edema. Mild left basilar atelectasis. Electronically Signed   By: Lahoma Crocker M.D.   On: 12/15/2016 07:58   Dg Chest Port 1 View  Result Date: 12/14/2016 CLINICAL DATA:  Post aortic valve repair EXAM: PORTABLE CHEST 1 VIEW COMPARISON:  12/10/2016 FINDINGS: Changes of aortic valve repair. Right central line tip is at the cavoatrial junction. No pneumothorax. Cardiomegaly. No confluent opacities or effusions. IMPRESSION: Post aortic valve repair.  Mild cardiomegaly.  No acute findings. Electronically Signed   By: Rolm Baptise M.D.   On: 12/14/2016 11:04   Ct Angio Chest Aorta W/cm &/or Wo/cm  Result Date: 11/29/2016 CLINICAL DATA:  81 year old female with history of severe aortic stenosis. Preprocedural study prior to potential transcatheter aortic valve replacement (TAVR). EXAM: CT ANGIOGRAPHY CHEST, ABDOMEN AND PELVIS TECHNIQUE: Multidetector CT imaging through the chest, abdomen and pelvis was performed using the standard protocol during bolus administration of intravenous contrast. Multiplanar reconstructed images and MIPs were obtained and reviewed to evaluate the vascular  anatomy. CONTRAST:  80 mL of Isovue 370. COMPARISON:  CT the abdomen and pelvis 03/20/2011. Chest CT 03/07/2011. FINDINGS: CTA CHEST FINDINGS Cardiovascular: Heart size is enlarged with left atrial dilatation. Notably, within the tip of the left atrial appendage there is a filling defect, concerning for left atrial appendage thrombus. There is no significant pericardial fluid, thickening or pericardial calcification. There is aortic atherosclerosis, as well as atherosclerosis of the great vessels of the mediastinum and the coronary arteries, including calcified atherosclerotic plaque in the left main, left anterior descending and right coronary arteries. Severe thickening calcification of the aortic valve. Aberrant right subclavian artery (normal anatomical variant). Mediastinum/Lymph Nodes: No pathologically enlarged mediastinal or hilar lymph nodes. Esophagus is unremarkable in appearance. No axillary lymphadenopathy. Lungs/Pleura: No suspicious appearing pulmonary nodules or masses. No acute consolidative airspace disease. No pleural effusions. Musculoskeletal/Soft Tissues: Status post left modified radical mastectomy with left-sided breast implant. Surgical clips in the left axilla from prior lymph node dissection. Status post right shoulder arthroplasty. There are no aggressive appearing lytic or blastic lesions noted in the visualized portions of the skeleton. CTA ABDOMEN AND PELVIS FINDINGS Hepatobiliary: The liver has a shrunken appearance and nodular contour, suggestive of underlying cirrhosis. No discrete cystic or solid hepatic lesions. No intra or extrahepatic biliary ductal dilatation. Several partially calcified gallstones are noted lying dependently in the gallbladder. No current findings to suggest an acute cholecystitis at this time. Pancreas: No pancreatic mass. No pancreatic ductal dilatation. No pancreatic or peripancreatic fluid or inflammatory changes. Spleen: Unremarkable. Adrenals/Urinary  Tract: 1.6 cm low-attenuation lesion in the interpolar region of the right kidney is compatible with a simple cyst. Several other subcentimeter low-attenuation lesions in both kidneys are too small to definitively characterize, but are favored to represent tiny cysts. No hydroureteronephrosis. Urinary bladder is partially obscured by extensive beam hardening artifact from the patient's bilateral hip arthroplasties, but the visualized portions of the urinary bladder are unremarkable. Stomach/Bowel: The appearance of the stomach is normal. There is no pathologic dilatation of small bowel or colon. Several colonic diverticulae are noted, without surrounding inflammatory changes to suggest an acute diverticulitis at this time. The appendix is not confidently identified and may be surgically absent. Regardless, there are no inflammatory changes noted adjacent to the cecum to suggest the presence of an acute appendicitis at this time. Vascular/Lymphatic: Aortic atherosclerosis, with vascular findings and measurements pertinent to potential TAVR procedure, as detailed below. No aneurysm or  dissection identified in the abdominal or pelvic vasculature. Celiac axis, superior mesenteric artery and inferior mesenteric artery and their major branches appear patent without definite hemodynamically significant stenosis. Single renal arteries bilaterally both appear widely patent. No lymphadenopathy noted in the abdomen or pelvis. Reproductive: Uterus and ovaries are unremarkable in appearance. Other: No significant volume of ascites.  No pneumoperitoneum. Musculoskeletal: Bilateral total hip arthroplasties. There are no aggressive appearing lytic or blastic lesions noted in the visualized portions of the skeleton. VASCULAR MEASUREMENTS PERTINENT TO TAVR: AORTA: Minimal Aortic Diameter -  14 x 15 mm Severity of Aortic Calcification -  moderate to severe RIGHT PELVIS: Right Common Iliac Artery - Minimal Diameter - 11.3 x 5.3 mm  Tortuosity - mild Calcification - severe Right External Iliac Artery - Minimal Diameter - 8.4 x 8.6 mm Tortuosity - moderate Calcification - minimal Right Common Femoral Artery - Minimal Diameter - 7.2 x 5.8 mm Tortuosity - mild Calcification - moderate LEFT PELVIS: Left Common Iliac Artery - Minimal Diameter - 8.1 x 6.4 mm Tortuosity - mild Calcification - severe Left External Iliac Artery - Minimal Diameter - 8.6 x 6.7 mm Tortuosity - moderate to severe Calcification - minimal Left Common Femoral Artery - Minimal Diameter - 6.7 x 5.2 mm Tortuosity - mild Calcification - moderate Review of the MIP images confirms the above findings. IMPRESSION: 1. Vascular findings and measurements pertinent to potential TAVR procedure, as detailed above. This patient does appear to have suitable pelvic arterial access bilaterally, preferable on the right side due to greater vessel diameter and lower degree of tortuosity. 2. Severe thickening calcification of the aortic valve, compatible with the reported clinical history of severe aortic stenosis. 3. Apparent filling defect in the tip of the left atrial appendage, concerning for potential thrombus. Attention at time of transesophageal echocardiogram is recommended to exclude the possibility of left atrial appendage thrombus. 4. Morphologic changes in the liver suggestive of underlying cirrhosis. 5. Cholelithiasis without evidence of acute cholecystitis at this time. 6. Colonic diverticulosis without evidence of acute diverticulitis at this time. 7. Aberrant right subclavian artery (normal anatomical variant) incidentally noted. 8. Additional incidental findings, as above. Electronically Signed   By: Vinnie Langton M.D.   On: 11/29/2016 13:36   Ct Angio Abd/pel W/ And/or W/o  Result Date: 11/29/2016 CLINICAL DATA:  81 year old female with history of severe aortic stenosis. Preprocedural study prior to potential transcatheter aortic valve replacement (TAVR). EXAM: CT  ANGIOGRAPHY CHEST, ABDOMEN AND PELVIS TECHNIQUE: Multidetector CT imaging through the chest, abdomen and pelvis was performed using the standard protocol during bolus administration of intravenous contrast. Multiplanar reconstructed images and MIPs were obtained and reviewed to evaluate the vascular anatomy. CONTRAST:  80 mL of Isovue 370. COMPARISON:  CT the abdomen and pelvis 03/20/2011. Chest CT 03/07/2011. FINDINGS: CTA CHEST FINDINGS Cardiovascular: Heart size is enlarged with left atrial dilatation. Notably, within the tip of the left atrial appendage there is a filling defect, concerning for left atrial appendage thrombus. There is no significant pericardial fluid, thickening or pericardial calcification. There is aortic atherosclerosis, as well as atherosclerosis of the great vessels of the mediastinum and the coronary arteries, including calcified atherosclerotic plaque in the left main, left anterior descending and right coronary arteries. Severe thickening calcification of the aortic valve. Aberrant right subclavian artery (normal anatomical variant). Mediastinum/Lymph Nodes: No pathologically enlarged mediastinal or hilar lymph nodes. Esophagus is unremarkable in appearance. No axillary lymphadenopathy. Lungs/Pleura: No suspicious appearing pulmonary nodules or masses. No acute consolidative airspace disease.  No pleural effusions. Musculoskeletal/Soft Tissues: Status post left modified radical mastectomy with left-sided breast implant. Surgical clips in the left axilla from prior lymph node dissection. Status post right shoulder arthroplasty. There are no aggressive appearing lytic or blastic lesions noted in the visualized portions of the skeleton. CTA ABDOMEN AND PELVIS FINDINGS Hepatobiliary: The liver has a shrunken appearance and nodular contour, suggestive of underlying cirrhosis. No discrete cystic or solid hepatic lesions. No intra or extrahepatic biliary ductal dilatation. Several partially  calcified gallstones are noted lying dependently in the gallbladder. No current findings to suggest an acute cholecystitis at this time. Pancreas: No pancreatic mass. No pancreatic ductal dilatation. No pancreatic or peripancreatic fluid or inflammatory changes. Spleen: Unremarkable. Adrenals/Urinary Tract: 1.6 cm low-attenuation lesion in the interpolar region of the right kidney is compatible with a simple cyst. Several other subcentimeter low-attenuation lesions in both kidneys are too small to definitively characterize, but are favored to represent tiny cysts. No hydroureteronephrosis. Urinary bladder is partially obscured by extensive beam hardening artifact from the patient's bilateral hip arthroplasties, but the visualized portions of the urinary bladder are unremarkable. Stomach/Bowel: The appearance of the stomach is normal. There is no pathologic dilatation of small bowel or colon. Several colonic diverticulae are noted, without surrounding inflammatory changes to suggest an acute diverticulitis at this time. The appendix is not confidently identified and may be surgically absent. Regardless, there are no inflammatory changes noted adjacent to the cecum to suggest the presence of an acute appendicitis at this time. Vascular/Lymphatic: Aortic atherosclerosis, with vascular findings and measurements pertinent to potential TAVR procedure, as detailed below. No aneurysm or dissection identified in the abdominal or pelvic vasculature. Celiac axis, superior mesenteric artery and inferior mesenteric artery and their major branches appear patent without definite hemodynamically significant stenosis. Single renal arteries bilaterally both appear widely patent. No lymphadenopathy noted in the abdomen or pelvis. Reproductive: Uterus and ovaries are unremarkable in appearance. Other: No significant volume of ascites.  No pneumoperitoneum. Musculoskeletal: Bilateral total hip arthroplasties. There are no aggressive  appearing lytic or blastic lesions noted in the visualized portions of the skeleton. VASCULAR MEASUREMENTS PERTINENT TO TAVR: AORTA: Minimal Aortic Diameter -  14 x 15 mm Severity of Aortic Calcification -  moderate to severe RIGHT PELVIS: Right Common Iliac Artery - Minimal Diameter - 11.3 x 5.3 mm Tortuosity - mild Calcification - severe Right External Iliac Artery - Minimal Diameter - 8.4 x 8.6 mm Tortuosity - moderate Calcification - minimal Right Common Femoral Artery - Minimal Diameter - 7.2 x 5.8 mm Tortuosity - mild Calcification - moderate LEFT PELVIS: Left Common Iliac Artery - Minimal Diameter - 8.1 x 6.4 mm Tortuosity - mild Calcification - severe Left External Iliac Artery - Minimal Diameter - 8.6 x 6.7 mm Tortuosity - moderate to severe Calcification - minimal Left Common Femoral Artery - Minimal Diameter - 6.7 x 5.2 mm Tortuosity - mild Calcification - moderate Review of the MIP images confirms the above findings. IMPRESSION: 1. Vascular findings and measurements pertinent to potential TAVR procedure, as detailed above. This patient does appear to have suitable pelvic arterial access bilaterally, preferable on the right side due to greater vessel diameter and lower degree of tortuosity. 2. Severe thickening calcification of the aortic valve, compatible with the reported clinical history of severe aortic stenosis. 3. Apparent filling defect in the tip of the left atrial appendage, concerning for potential thrombus. Attention at time of transesophageal echocardiogram is recommended to exclude the possibility of left atrial appendage thrombus.  4. Morphologic changes in the liver suggestive of underlying cirrhosis. 5. Cholelithiasis without evidence of acute cholecystitis at this time. 6. Colonic diverticulosis without evidence of acute diverticulitis at this time. 7. Aberrant right subclavian artery (normal anatomical variant) incidentally noted. 8. Additional incidental findings, as above.  Electronically Signed   By: Vinnie Langton M.D.   On: 11/29/2016 13:36   Disposition   Pt is being discharged home today in good condition.  Follow-up Plans & Appointments   ACTIVITY AND EXERCISE . Daily activity and exercise are an important part of your recovery. People recover at different rates depending on their general health and type of valve procedure. . Most people require six to 10 weeks to feel recovered. . No lifting, pushing, pulling more than 10 pounds (examples to avoid: groceries, vacuuming, gardening, golfing):  - For one week with a procedure through the groin.  - For six weeks for procedures through the chest wall.  - For three months for procedures through the breast-bone. NOTE: You will typically see one of our providers 7-10 days after your procedure to discuss [place] the above activities.    DRIVING . Do not drive for four weeks after the date of your procedure. . If you have been told by your doctor in the past that you may not drive, you must talk with him/her before you begin driving again. . When you resume driving, you must have someone with you.   DRESSING . Groin site: you may leave the clear dressing over the site for up to one week or until it falls off.   HYGIENE . If you had a femoral (leg) procedure, you may take a shower when you return home. After the shower, pat the site dry. Do NOT use powder, oils or lotions in your groin area until the site has completely healed. . If you had a chest procedure, you may shower when you return home unless specifically instructed not to by your discharging practitioner.  - DO NOT scrub incision; pat dry with a towel  - DO NOT apply any lotions, oils, powders to the incision  - No tub baths / swimming for at least six weeks. . If you notice any fevers, chills, increased pain, swelling, bleeding or pus, please contact your doctor.  ADDITIONAL INFORMATION . If you are going to have an upcoming dental  procedure, please contact our office as you may require antibiotics ahead of time to prevent infection on your heart valve.    Follow-up Information    Ermalinda Barrios, PA-C Follow up on 12/23/2016.   Specialty:  Cardiology Why:  at 12:45 pm Contact information: [address] [place] [address] [phone]        Lauree Chandler, MD Follow up.   Specialty:  Cardiology Why:  you will see Dr. Angelena Form back in 1 month with a repeat echo the same day.  His nurse Fraser Din will call you with date and time.   Contact information: [address]  [place] [address] [phone]            Discharge Medications   Current Discharge Medication List    CONTINUE these medications which have CHANGED   Details  acetaminophen (TYLENOL) 325 MG tablet Take 2 tablets (650 mg total) by mouth every 4 (four) hours as needed for mild pain or moderate pain.      CONTINUE these medications which have NOT CHANGED   Details  atorvastatin (LIPITOR) 20  MG tablet Take 20 mg by mouth daily.    Calcium Carbonate-Vitamin D (CALCIUM-VITAMIN D) 500-200 MG-UNIT tablet Take 1 tablet by mouth daily.    diltiazem (TIAZAC) 360 MG 24 hr capsule Take 360 mg by mouth every evening.    furosemide (LASIX) 20 MG tablet TAKE 1 TABLET (20 MG TOTAL) BY MOUTH DAILY. Qty: 90 tablet, Refills: 1    glucosamine-chondroitin 500-400 MG tablet Take 1 tablet by mouth 2 (two) times daily.    losartan (COZAAR) 50 MG tablet Take 50 mg by mouth daily.    Melatonin 10 MG TABS Take 1 tablet by mouth at bedtime.     nitroGLYCERIN (NITROSTAT) 0.4 MG SL tablet Place 0.4 mg under the tongue every 5 (five) minutes as needed for chest pain. For chest pain     omeprazole (PRILOSEC) 20 MG capsule Take 20 mg by mouth daily.     polycarbophil (FIBER-CAPS) 625 MG tablet Take 625 mg by mouth daily.    predniSONE (DELTASONE) 5 MG tablet Take 5 mg by mouth daily with breakfast.    rivaroxaban  (XARELTO) 20 MG TABS tablet Take 20 mg by mouth daily with supper.    amoxicillin (AMOXIL) 500 MG capsule TAKE 4 CAPSULES BY MOUTH 1 HOUR BEFORE DENTAL APPT FOR PREMED Refills: 0    clobetasol ointment (TEMOVATE) [id number] % Apply 1 application topically 2 (two) times daily as needed (for skin irritation).    triamcinolone cream (KENALOG) 0.1 % Apply 1 application topically as needed (for skin irritation).      STOP taking these medications     enoxaparin (LOVENOX) 80 MG/0.8ML injection          Outstanding Labs/Studies   CBC and BMET on 1 week follow up.    Duration of Discharge Encounter   Greater than 30 minutes including physician time.  Signed, Cecilie Kicks NP 12/16/2016, 9:33 AM

## 2016-12-16 NOTE — Progress Notes (Signed)
CARDIAC REHAB PHASE I   PRE:  Rate/Rhythm: 78 afib  BP:  Supine:   Sitting: 144/69  Standing:    SaO2: 97%RA  MODE:  Ambulation: 700 ft   POST:  Rate/Rhythm: 96 afib  BP:  Supine:   Sitting: 161/90  Standing:    SaO2: 98%RA 1110-1142 Pt walked 700 ft on RA with steady gait and tolerated well. Education completed with pt who voiced understanding. Encouraged watching sodium and gave heart healthy diet. Encouraged walking for ex and IS. Discussed CRP 2 and will refer to [place].   Graylon Good, RN BSN  12/16/2016 11:39 AM

## 2016-12-16 NOTE — Discharge Instructions (Signed)
ACTIVITY AND EXERCISE °• Daily activity and exercise are an important part of your recovery. People recover at different rates depending on their general health and type of valve procedure. °• Most people require six to 10 weeks to feel recovered. °• No lifting, pushing, pulling more than 10 pounds (examples to avoid: groceries, vacuuming, gardening, golfing): °            - For one week with a procedure through the groin. °            - For six weeks for procedures through the chest wall. °            - For three months for procedures through the breast-bone. °NOTE: You will typically see one of our providers 7-10 days after your procedure to discuss WHEN TO RESUME the above activities.  °  °  °DRIVING °• Do not drive for four weeks after the date of your procedure. °• If you have been told by your doctor in the past that you may not drive, you must talk with him/her before you begin driving again. °• When you resume driving, you must have someone with you. °  °  °DRESSING °• Groin site: you may leave the clear dressing over the site for up to one week or until it falls off. °  °  °HYGIENE °• If you had a femoral (leg) procedure, you may take a shower when you return home. After the shower, pat the site dry. Do NOT use powder, oils or lotions in your groin area until the site has completely healed. °• If you had a chest procedure, you may shower when you return home unless specifically instructed not to by your discharging practitioner. °            - DO NOT scrub incision; pat dry with a towel °            - DO NOT apply any lotions, oils, powders to the incision °            - No tub baths / swimming for at least six weeks. °• If you notice any fevers, chills, increased pain, swelling, bleeding or pus, please contact your doctor. °  °ADDITIONAL INFORMATION °• If you are going to have an upcoming dental procedure, please contact our office as you may require antibiotics ahead of time to prevent infection on your  heart valve.  °  °

## 2016-12-17 NOTE — Telephone Encounter (Signed)
Patient contacted regarding discharge from [facility] on 12/16/16.  Patient understands to follow up with provider --Ermalinda Barrios, PA on 12/23/16 at 12:15 at [address].  Patient understands discharge instructions? Yes  Patient understands medications and regiment? Yes  Patient understands to bring all medications to this visit? yes   Pt also aware of appointment for echo and with Dr. Angelena Form on 01/14/17

## 2016-12-17 NOTE — Anesthesia Postprocedure Evaluation (Addendum)
Anesthesia Post Note  Patient: JAZMANE LORETTE  Procedure(s) Performed: Procedure(s) (LRB): TRANSCATHETER AORTIC VALVE REPLACEMENT, TRANSFEMORAL (N/A) TRANSESOPHAGEAL ECHOCARDIOGRAM (TEE) (N/A)  Patient location during evaluation: Nursing Unit Anesthesia Type: General Level of consciousness: awake and alert, patient cooperative and oriented Pain management: pain level controlled Vital Signs Assessment: post-procedure vital signs reviewed and stable Respiratory status: spontaneous breathing, nonlabored ventilation and respiratory function stable Cardiovascular status: blood pressure returned to baseline and stable Postop Assessment: no signs of nausea or vomiting Anesthetic complications: no Comments: Delayed entry: pt seen post op day 1        Last Vitals:  Vitals:   12/15/16 1956 12/16/16 0357  BP: 113/65 (!) 141/67  Pulse: 70 (!) 57  Resp: 18 18  Temp: 37 C 36.9 C    Last Pain:  Vitals:   12/16/16 0357  TempSrc: Oral  PainSc:                  Shawnae Leiva,E. Maurizio Geno

## 2016-12-21 LAB — ECHOCARDIOGRAM COMPLETE
Height: 67 in
WEIGHTICAEL: 2888.91 [oz_av]

## 2016-12-23 ENCOUNTER — Encounter: Payer: Self-pay | Admitting: Physician Assistant

## 2016-12-23 ENCOUNTER — Ambulatory Visit (INDEPENDENT_AMBULATORY_CARE_PROVIDER_SITE_OTHER): Payer: Medicare Other | Admitting: Physician Assistant

## 2016-12-23 VITALS — BP 142/70 | HR 63 | Ht 67.0 in | Wt 180.1 lb

## 2016-12-23 DIAGNOSIS — I10 Essential (primary) hypertension: Secondary | ICD-10-CM

## 2016-12-23 DIAGNOSIS — Z952 Presence of prosthetic heart valve: Secondary | ICD-10-CM

## 2016-12-23 DIAGNOSIS — I5032 Chronic diastolic (congestive) heart failure: Secondary | ICD-10-CM | POA: Diagnosis not present

## 2016-12-23 DIAGNOSIS — I482 Chronic atrial fibrillation: Secondary | ICD-10-CM

## 2016-12-23 DIAGNOSIS — I4821 Permanent atrial fibrillation: Secondary | ICD-10-CM

## 2016-12-23 DIAGNOSIS — I2510 Atherosclerotic heart disease of native coronary artery without angina pectoris: Secondary | ICD-10-CM | POA: Diagnosis not present

## 2016-12-23 NOTE — Patient Instructions (Signed)
Medication Instructions:  Your physician recommends that you continue on your current medications as directed. Please refer to the Current Medication list given to you today.   Labwork: TODAY:  CBC & BMET  Testing/Procedures: None ordered  Follow-Up: Your physician recommends that you schedule a follow-up appointment in: [place]   Any Other Special Instructions Will Be Listed Below (If Applicable).     If you need a refill on your cardiac medications before your next appointment, please call your pharmacy.

## 2016-12-23 NOTE — Progress Notes (Signed)
Cardiology Office Note    Date:  12/23/2016   ID:  Jacqueline MOCARSKI, DOB [birth year], MRN [id number]  PCP:  Jonathon Bellows, MD  Cardiologist: Dr. Radford Pax TAVR: Dr. Angelena Form  Chief Complaint  Patient presents with  . 1 week TOC    History of Present Illness:  [name] is a 81 y.o. female  with a PMH significant for chronic Afib (on xarelto), HTN, chronic diastolic heart failure, aortic stenosis, HLD, obesity, GERD, and breast cancer. She presented to the ED 11/17/16 for sudden onset chest pain and found to have severe AS. CADconsisting of 70% focal excentric ostial narrowing of 1st diag branch of LAD and superior takeoff dominant RCA,  with mild 30-40% very proximal stenosis eccentrically.  Normal ramus intermediate and left circumflex coronary arteries. She has chronic a fib. On Xarelto with CHA2DS2VASC score of 5.  She underwent TAVR 12/14/16. She feels so much better. She is walking 20 minutes a day and wants to do more. She denies any chest pain, palpitations, dyspnea, dyspnea on exertion, dizziness or presyncope. She was to start driving.      Past Medical History:  Diagnosis Date  . Anginal pain ([place])   . Aortic stenosis    severe AS with moderate AR by echo with normal LVF  . Arthritis   . CAD in native artery, non obstructive cath 11/2016.  12/16/2016  . [facility])    left breast mastectomy(cancer)-surgery only  . Chronic diastolic congestive heart failure ([place])   . Complication of anesthesia    extremely claustophobic(only occurs with narcotic medications as stated per pt)  . Diverticulosis   . Fibromyalgia   . GERD (gastroesophageal reflux disease)   . Hard of hearing    wears hearing aides   . History of breast cancer no recurrence   dx 2004   s/p  left breast mastectomy w/ node dissection/   no chemo or radiation  . History of TIA (transient ischemic attack)    per scan--  no residual  . Hyperlipidemia   . Hypertension   . Nocturia   . OSA on CPAP     severe/  AHI 34/hr  . Permanent atrial fibrillation (HCC)    on Xarelto for CHADS2VASC score of 5  . Pulmonary HTN    PASP 30mmHg by echo 11/2016  . Stroke ([place])    tia  . Urge urinary incontinence   . Wears glasses     Past Surgical History:  Procedure Laterality Date  . ABDOMINOPLASTY  1987  . APPENDECTOMY  1965  . BREAST SURGERY Left 2004   mastectomy   . CARDIAC CATHETERIZATION N/A 11/19/2016   Procedure: Right/Left Heart Cath and Coronary Angiography;  Surgeon: Troy Sine, MD;  Location: [facility];  Service: Cardiovascular;  Laterality: N/A;  . CARPAL TUNNEL RELEASE Right 07-08-2004  . COLONOSCOPY WITH ESOPHAGOGASTRODUODENOSCOPY (EGD)  2006  . EXPLANTATION BREAST IMPLANT AND DEBRIDEMENT Left 09/15/2005  . INTERSTIM IMPLANT PLACEMENT N/A 08/22/2014   Procedure: Barrie [place] IMPLANT STAGE ONE/TWO;  Surgeon: Reece Packer, MD;  Location: [facility];  Service: Urology;  Laterality: N/A;  . JOINT REPLACEMENT Left 2017   knee replacement  . MASTECTOMY    . MASTECTOMY, MODIFIED RADICAL W/RECONSTRUCTION  09-16-2003   left breast W/ SLN DISSECTION (pt states had 13 operations for implant infection   . REPLACE TISSUE EXPANDER LEFT BREAST AND TOTAL CAPSULECTOMY  08-05-2006  . REVISION BREAST RECONSTRUCTION Left 11-11-2004  12-03-2004  DRAINAGE SEREMA LEFT CHEST  . TEE WITHOUT CARDIOVERSION N/A 12/14/2016   Procedure: TRANSESOPHAGEAL ECHOCARDIOGRAM (TEE);  Surgeon: Burnell Blanks, MD;  Location: [place] [facility];  Service: Open Heart Surgery;  Laterality: N/A;  . TOTAL HIP ARTHROPLASTY Left 07/17/2013   Procedure: LEFT TOTAL HIP ARTHROPLASTY ANTERIOR APPROACH;  Surgeon: Mauri Pole, MD;  Location: [facility];  Service: Orthopedics;  Laterality: Left;  . TOTAL HIP ARTHROPLASTY Right 01/28/2015   Procedure: RIGHT TOTAL HIP ARTHROPLASTY ANTERIOR APPROACH;  Surgeon: Paralee Cancel, MD;  Location: [facility];  Service: Orthopedics;  Laterality: Right;  . TOTAL KNEE  ARTHROPLASTY Left 11/11/2015   Procedure: LEFT TOTAL KNEE ARTHROPLASTY;  Surgeon: Paralee Cancel, MD;  Location: [facility];  Service: Orthopedics;  Laterality: Left;  . TOTAL SHOULDER ARTHROPLASTY  08/11/2012   Procedure: TOTAL SHOULDER ARTHROPLASTY;  Surgeon: Augustin Schooling, MD;  Location: [facility];  Service: Orthopedics;  Laterality: Right;  RIGHT  TOTAL SHOULDER  ARTHROPLASTY   . TRANSCATHETER AORTIC VALVE REPLACEMENT, TRANSFEMORAL N/A 12/14/2016   Procedure: TRANSCATHETER AORTIC VALVE REPLACEMENT, TRANSFEMORAL;  Surgeon: Burnell Blanks, MD;  Location: [facility];  Service: Open Heart Surgery;  Laterality: N/A;  . TRANSTHORACIC ECHOCARDIOGRAM  02-25-2014   moderate LVH/  ef 55-60%/  mod. to sev. calcification AV with mild to moderate AV stenosis/  mild to mod. AR and MR/ mild PR/   moderate to severe LAE  . TUBAL LIGATION  1973    Current Medications: Outpatient Medications Prior to Visit  Medication Sig Dispense Refill  . acetaminophen (TYLENOL) 325 MG tablet Take 2 tablets (650 mg total) by mouth every 4 (four) hours as needed for mild pain or moderate pain.    Marland Kitchen amoxicillin (AMOXIL) 500 MG capsule TAKE 4 CAPSULES BY MOUTH 1 HOUR BEFORE DENTAL APPT FOR PREMED  0  . atorvastatin (LIPITOR) 20 MG tablet Take 20 mg by mouth daily.    . Calcium Carbonate-Vitamin D (CALCIUM-VITAMIN D) 500-200 MG-UNIT tablet Take 1 tablet by mouth daily.    . clobetasol ointment (TEMOVATE) 0.98 % Apply 1 application topically 2 (two) times daily as needed (for skin irritation).    Marland Kitchen diltiazem (TIAZAC) 360 MG 24 hr capsule Take 360 mg by mouth every evening.    . furosemide (LASIX) 20 MG tablet TAKE 1 TABLET (20 MG TOTAL) BY MOUTH DAILY. 90 tablet 1  . glucosamine-chondroitin 500-400 MG tablet Take 1 tablet by mouth 2 (two) times daily.    Marland Kitchen losartan (COZAAR) 50 MG tablet Take 50 mg by mouth daily.    . Melatonin 10 MG TABS Take 1 tablet by mouth at bedtime.     . nitroGLYCERIN (NITROSTAT) 0.4 MG SL tablet Place 0.4  mg under the tongue every 5 (five) minutes as needed for chest pain. For chest pain     . omeprazole (PRILOSEC) 20 MG capsule Take 20 mg by mouth daily.     . polycarbophil (FIBER-CAPS) 625 MG tablet Take 625 mg by mouth daily.    . predniSONE (DELTASONE) 5 MG tablet Take 5 mg by mouth daily with breakfast.    . rivaroxaban (XARELTO) 20 MG TABS tablet Take 20 mg by mouth daily with supper.    . triamcinolone cream (KENALOG) 0.1 % Apply 1 application topically as needed (for skin irritation).     No facility-administered medications prior to visit.      Allergies:   Beta adrenergic blockers; Betapace [sotalol hcl]; Other; Oxycodone; Clonidine derivatives; Crestor [rosuvastatin calcium]; Adhesive [tape]; Latex; and  Tramadol   Social History   Social History  . Marital status: Married    Spouse name: N/A  . Number of children: N/A  . Years of education: N/A   Social History Main Topics  . Smoking status: Former Smoker    Packs/day: 0.25    Years: 8.00    Types: Cigarettes    Quit date: 10/18/1966  . Smokeless tobacco: Never Used  . Alcohol use No  . Drug use: No  . Sexual activity: Yes   Other Topics Concern  . None   Social History Narrative  . None     Family History:  The patient's family history includes Heart disease in her father and mother.   ROS:   Please see the history of present illness.    Review of Systems  Constitution: Negative.  HENT: Negative.   Eyes: Negative.   Cardiovascular: Negative.   Respiratory: Negative.   Hematologic/Lymphatic: Negative.   Musculoskeletal: Negative.  Negative for joint pain.  Gastrointestinal: Negative.   Genitourinary: Negative.   Neurological: Negative.    All other systems reviewed and are negative.   PHYSICAL EXAM:   VS:  BP (!) 142/70   Pulse 63   Ht 5\' 7"  (1.702 m)   Wt 180 lb 1.9 oz (81.7 kg)   SpO2 98%   BMI 28.21 kg/m   Physical Exam  GEN: Well nourished, well developed, in no acute distress  Neck: no  JVD, carotid bruits, or masses Cardiac:RRR; And over 6 systolic murmur at the left sternal border, no rubs, or gallops  Respiratory:  clear to auscultation bilaterally, normal work of breathing GI: soft, nontender, nondistended, + BS Ext: Right groin with small hematoma resolving no hemorrhage at cath site was lower extremities without cyanosis, clubbing, or edema, Good distal pulses bilaterally Psych: euthymic mood, full affect  Wt Readings from Last 3 Encounters:  12/23/16 180 lb 1.9 oz (81.7 kg)  12/16/16 179 lb 6.4 oz (81.4 kg)  12/10/16 183 lb 1.6 oz (83.1 kg)      Studies/Labs Reviewed:   EKG:  EKG is  ordered today.  The ekg ordered today demonstrates Atrial fibrillation at 56 bpm poor R wave progression anteriorly  Recent Labs: 11/18/2016: B Natriuretic Peptide 352.5 12/10/2016: ALT 18 12/15/2016: Magnesium 1.8 12/16/2016: BUN 27; Creatinine, Ser 1.12; Hemoglobin 13.1; Platelets 150; Potassium 3.7; Sodium 141   Lipid Panel No results found for: CHOL, TRIG, HDL, CHOLHDL, VLDL, LDLCALC, LDLDIRECT  Additional studies/ records that were reviewed today include:  2-D echo 11/18/16 Study Conclusions   - Left ventricle: The cavity size was normal. Wall thickness was   increased in a pattern of moderate LVH. Systolic function was   normal. The estimated ejection fraction was in the range of 60%   to 65%. Wall motion was normal; there were no regional wall   motion abnormalities. Doppler parameters are consistent with high   ventricular filling pressure. - Aortic valve: Valve mobility was restricted. There was moderate   to severe stenosis. There was moderate regurgitation. Valve area   (VTI): 0.75 cm^2. Valve area (Vmax): 0.77 cm^2. Valve area   (Vmean): 0.69 cm^2. - Mitral valve: Calcified annulus. Mildly thickened leaflets .   There was mild regurgitation. Valve area by continuity equation   (using LVOT flow): 2.42 cm^2. - Left atrium: The atrium was severely dilated. - Right  ventricle: The cavity size was mildly dilated. - Right atrium: The atrium was severely dilated. - Pulmonary arteries: Systolic pressure was moderately  increased.   PA peak pressure: 50 mm Hg (S).   Impressions:   - Normal LV systolic function; moderate LVH; elevated LV filling   pressure; biatrial enlargement; mild RVE; calcified aortic valve   with moderate to severe AS and moderate AI; mild MR and TR;   moderately elevated pulmonary pressure.   2-D echo 12/15/16 Study Conclusions   - Left ventricle: The cavity size was normal. Wall thickness was   increased in a pattern of moderate LVH. Systolic function was   normal. The estimated ejection fraction was in the range of 60%   to 65%. Wall motion was normal; there were no regional wall   motion abnormalities. The study is not technically sufficient to   allow evaluation of LV diastolic function. - Aortic valve: s/p TAVR valve. No paravalvular leak. Mean gradient   (S): 13 mm Hg. Peak gradient (S): 26 mm Hg. Valve area (VTI):   1.59 cm^2. Valve area (Vmax): 1.46 cm^2. Valve area (Vmean): 1.47   cm^2. - Mitral valve: Mildly thickened leaflets . There was trivial   regurgitation. - Left atrium: Severely dilated. - Right atrium: Severely dilated. - Tricuspid valve: There was mild regurgitation. - Pulmonary arteries: PA peak pressure: 39 mm Hg (S). - Systemic veins: Not visualized.   Impressions:   - Compared to a recent echo, there is now a TAVR valve in place   with improved gradients, AVA around 1.4-1.5 cm2 (based on LVOT   diameter of 1.9 cm) and no paravalvular leak. LVEF is stable at   60-65%.   Cardiac catheterization 2/2/18Conclusion     Ost RCA lesion, 35 %stenosed.  Ost 1st Diag lesion, 70 %stenosed.  The left ventricular systolic function is normal.  LV end diastolic pressure is normal.  There is severe aortic valve stenosis.   Normal LV function with at least 1+ MR.   Mild right heart pressure elevation  with mild pulmonary hypertension.   Moderately severe aortic valve stenosis with a peak gradient of 29 and mean gradient of 21.7.  Aortic valve area calculated to 0.8 cm2.    Mild coronary obstructive disease with 70% focal eccentric ostial narrowing of the first diagonal branch of the LAD and superior takeoff dominant RCA with mild 30-40% very proximal stenosis eccentrically.  Normal ramus intermediate and left circumflex coronary arteries.   Mild calcification of the ascending aorta.  Supravalvular aortography was not performed to assess for aortic regurgitation.   RECOMMENDATION: Consider surgical evaluation for TAVR            ASSESSMENT:    1. S/P TAVR (transcatheter aortic valve replacement)   2. Chronic diastolic congestive heart failure (HCC)   3. Permanent atrial fibrillation ([place])   4. Essential hypertension   5. CAD in native artery, non obstructive cath 11/2016.       PLAN:  In order of problems listed above:  Status post TAVR for severe AS. Patient doing well without any symptoms. Walking 20-30 minutes daily. Check bmet and cbc today. Echo and follow up with Dr. Angelena Form already scheduled.  Chronic diastolic CHF compensated Permanent atrial fibrillation with controlled rate on Xarelto  Essential hypertension controlled  CAD nonobstructive on cardiac catheterization     Medication Adjustments/Labs and Tests Ordered: Current medicines are reviewed at length with the patient today.  Concerns regarding medicines are outlined above.  Medication changes, Labs and Tests ordered today are listed in the Patient Instructions below. Patient Instructions  Medication Instructions:  Your physician recommends that  you continue on your current medications as directed. Please refer to the Current Medication list given to you today.   Labwork: TODAY:  CBC & BMET  Testing/Procedures: None ordered  Follow-Up: Your physician recommends that you schedule a follow-up  appointment in: [place]   Any Other Special Instructions Will Be Listed Below (If Applicable).     If you need a refill on your cardiac medications before your next appointment, please call your pharmacy.      Sumner Boast, PA-C  12/23/2016 12:47 PM    [facility] [place], [place], [place]  [address] Phone: [phone]; Fax: [phone]

## 2016-12-24 LAB — CBC
HEMATOCRIT: 41.7 % (ref 34.0–46.6)
HEMOGLOBIN: 14.4 g/dL (ref 11.1–15.9)
MCH: 31.6 pg (ref 26.6–33.0)
MCHC: 34.5 g/dL (ref 31.5–35.7)
MCV: 92 fL (ref 79–97)
Platelets: 229 10*3/uL (ref 150–379)
RBC: 4.55 x10E6/uL (ref 3.77–5.28)
RDW: 14.3 % (ref 12.3–15.4)
WBC: 10.3 10*3/uL (ref 3.4–10.8)

## 2016-12-24 LAB — BASIC METABOLIC PANEL
BUN/Creatinine Ratio: 22 (ref 12–28)
BUN: 21 mg/dL (ref 8–27)
CALCIUM: 10.1 mg/dL (ref 8.7–10.3)
CO2: 24 mmol/L (ref 18–29)
CREATININE: 0.95 mg/dL (ref 0.57–1.00)
Chloride: 100 mmol/L (ref 96–106)
GFR calc Af Amer: 66 mL/min/{1.73_m2} (ref >59)
GFR, EST NON AFRICAN AMERICAN: 58 mL/min/{1.73_m2} — AB (ref >59)
Glucose: 101 mg/dL — ABNORMAL HIGH (ref 65–99)
Potassium: 4.3 mmol/L (ref 3.5–5.2)
Sodium: 144 mmol/L (ref 134–144)

## 2017-01-04 ENCOUNTER — Encounter: Payer: Self-pay | Admitting: Cardiovascular Disease

## 2017-01-04 ENCOUNTER — Telehealth (HOSPITAL_COMMUNITY): Payer: Self-pay | Admitting: Family Medicine

## 2017-01-04 NOTE — Telephone Encounter (Signed)
Verified Medicare & AARP through [employer]  Reference [phone] & [phone] ... KJ

## 2017-01-10 DIAGNOSIS — L989 Disorder of the skin and subcutaneous tissue, unspecified: Secondary | ICD-10-CM | POA: Diagnosis not present

## 2017-01-10 DIAGNOSIS — R300 Dysuria: Secondary | ICD-10-CM | POA: Diagnosis not present

## 2017-01-10 DIAGNOSIS — Z952 Presence of prosthetic heart valve: Secondary | ICD-10-CM | POA: Diagnosis not present

## 2017-01-11 ENCOUNTER — Encounter (HOSPITAL_COMMUNITY)
Admission: RE | Admit: 2017-01-11 | Discharge: 2017-01-11 | Disposition: A | Discharge status: Home / Self Care | Payer: Medicare Other | Source: Ambulatory Visit | Attending: Cardiovascular Disease | Admitting: Cardiovascular Disease

## 2017-01-11 ENCOUNTER — Encounter (HOSPITAL_COMMUNITY): Payer: Self-pay

## 2017-01-11 VITALS — BP 124/57 | HR 70 | Ht 67.0 in | Wt 182.8 lb

## 2017-01-11 DIAGNOSIS — Z952 Presence of prosthetic heart valve: Secondary | ICD-10-CM | POA: Diagnosis present

## 2017-01-11 DIAGNOSIS — Z953 Presence of xenogenic heart valve: Secondary | ICD-10-CM | POA: Diagnosis not present

## 2017-01-11 NOTE — Progress Notes (Signed)
Cardiac Individual Treatment Plan  Patient Details  Name: [name] MRN: [id number] Date of Birth: [birth year] Referring Provider:     CARDIAC REHAB PHASE II ORIENTATION from 01/11/2017 in [place]  Referring Provider  Darlina Guys MD and Fransico Him MD      Initial Encounter Date:    CARDIAC REHAB PHASE II ORIENTATION from 01/11/2017 in [place]  Date  01/11/17  Referring Provider  Darlina Guys MD and Fransico Him MD      Visit Diagnosis: S/P TAVR (transcatheter aortic valve replacement)  Patient's Home Medications on Admission:  Current Outpatient Prescriptions:  .  acetaminophen (TYLENOL) 325 MG tablet, Take 2 tablets (650 mg total) by mouth every 4 (four) hours as needed for mild pain or moderate pain. (Patient taking differently: Take 650 mg by mouth 2 (two) times daily. ), Disp: , Rfl:  .  amoxicillin (AMOXIL) 500 MG capsule, TAKE 4 CAPSULES BY MOUTH 1 HOUR BEFORE DENTAL APPT FOR PREMED, Disp: , Rfl: 0 .  atorvastatin (LIPITOR) 20 MG tablet, Take 20 mg by mouth daily., Disp: , Rfl:  .  Calcium Carbonate-Vitamin D (CALCIUM-VITAMIN D) 500-200 MG-UNIT tablet, Take 1 tablet by mouth daily., Disp: , Rfl:  .  diltiazem (TIAZAC) 360 MG 24 hr capsule, Take 360 mg by mouth every evening., Disp: , Rfl:  .  furosemide (LASIX) 20 MG tablet, TAKE 1 TABLET (20 MG TOTAL) BY MOUTH DAILY. (Patient taking differently: TAKE 1 TABLET (20 MG TOTAL) BY MOUTH DAILY AS NEEDED), Disp: 90 tablet, Rfl: 1 .  glucosamine-chondroitin 500-400 MG tablet, Take 1 tablet by mouth 2 (two) times daily., Disp: , Rfl:  .  losartan (COZAAR) 50 MG tablet, Take 50 mg by mouth daily., Disp: , Rfl:  .  Melatonin 10 MG TABS, Take 1 tablet by mouth at bedtime. , Disp: , Rfl:  .  nitroGLYCERIN (NITROSTAT) 0.4 MG SL tablet, Place 0.4 mg under the tongue every 5 (five) minutes as needed for chest pain. For chest pain , Disp: , Rfl:  .   omeprazole (PRILOSEC) 20 MG capsule, Take 20 mg by mouth daily. , Disp: , Rfl:  .  polycarbophil (FIBER-CAPS) 625 MG tablet, Take 625 mg by mouth daily., Disp: , Rfl:  .  predniSONE (DELTASONE) 5 MG tablet, Take 5 mg by mouth daily with breakfast., Disp: , Rfl:  .  rivaroxaban (XARELTO) 20 MG TABS tablet, Take 20 mg by mouth daily with supper., Disp: , Rfl:   Past Medical History: Past Medical History:  Diagnosis Date  . Anginal pain ([place])   . Aortic stenosis    severe AS with moderate AR by echo with normal LVF  . Arthritis   . CAD in native artery, non obstructive cath 11/2016.  12/16/2016  . Cancer [facility])    left breast mastectomy(cancer)-surgery only  . Chronic diastolic congestive heart failure ([place])   . Complication of anesthesia    extremely claustophobic(only occurs with narcotic medications as stated per pt)  . Diverticulosis   . Fibromyalgia   . GERD (gastroesophageal reflux disease)   . Hard of hearing    wears hearing aides   . History of breast cancer no recurrence   dx 2004   s/p  left breast mastectomy w/ node dissection/   no chemo or radiation  . History of TIA (transient ischemic attack)    per scan--  no residual  . Hyperlipidemia   . Hypertension   .  Nocturia   . OSA on CPAP     severe/  AHI 34/hr  . Permanent atrial fibrillation ([facility])    on Xarelto for CHADS2VASC score of 5  . Pulmonary HTN    PASP 2mmHg by echo 11/2016  . Stroke ([place])    tia  . Urge urinary incontinence   . Wears glasses     Tobacco Use: History  Smoking Status  . Former Smoker  . Packs/day: 0.25  . Years: 8.00  . Types: Cigarettes  . Quit date: 10/18/1966  Smokeless Tobacco  . Never Used    Labs: Recent Review Flowsheet Data    Labs for ITP Cardiac and Pulmonary Rehab Latest Ref Rng & Units 12/10/2016 12/14/2016 12/14/2016 12/14/2016 12/14/2016   Hemoglobin A1c 4.8 - 5.6 % 5.6 - - - -   PHART 7.350 - 7.450 7.457(H) - - - 7.424   PCO2ART 32.0 - 48.0 mmHg 34.0 - - - 39.2   HCO3  20.0 - 28.0 mmol/L 23.6 - - - 25.8   TCO2 0 - 100 mmol/L - 24 24 26 27    O2SAT % 97.4 - - - 99.0      Capillary Blood Glucose: Lab Results  Component Value Date   GLUCAP 85 12/15/2016   GLUCAP 102 (H) 11/13/2011   GLUCAP 123 (H) 11/13/2011     Exercise Target Goals: Date: 01/11/17  Exercise Program Goal: Individual exercise prescription set with THRR, safety & activity barriers. Participant demonstrates ability to understand and report RPE using BORG scale, to self-measure pulse accurately, and to acknowledge the importance of the exercise prescription.  Exercise Prescription Goal: Starting with aerobic activity 30 plus minutes a day, 3 days per week for initial exercise prescription. Provide home exercise prescription and guidelines that participant acknowledges understanding prior to discharge.  Activity Barriers & Risk Stratification:     Activity Barriers & Cardiac Risk Stratification - 01/11/17 1114      Activity Barriers & Cardiac Risk Stratification   Activity Barriers Fibromyalgia;Joint Problems;Deconditioning;Muscular Weakness;Other (comment);Left Knee Replacement;Left Hip Replacement   Comments L mastectomy, R shoulder arthroplasty      6 Minute Walk:     6 Minute Walk    Row Name 01/11/17 1113 01/11/17 1125       6 Minute Walk   Phase Initial  -    Distance 1489 feet  -    Walk Time 6 minutes  -    # of Rest Breaks 0  -    MPH  - 2.82    METS  - 1.99    RPE 11  -    Perceived Dyspnea  1  -    Symptoms Yes (comment)  -    Comments mild SOB  -    Resting HR 70 bpm  -    Resting BP 124/57  -    Max Ex. HR 98 bpm  -    Max Ex. BP 130/70  -    2 Minute Post BP 110/60  -       Oxygen Initial Assessment:   Oxygen Re-Evaluation:   Oxygen Discharge (Final Oxygen Re-Evaluation):   Initial Exercise Prescription:     Initial Exercise Prescription - 01/11/17 1100      Date of Initial Exercise RX and Referring Provider   Date 01/11/17    Referring Provider Darlina Guys MD and Fransico Him MD     Recumbant Bike   Level 1.5   Minutes 10   METs 1.3  NuStep   Level 2   SPM 60   Minutes 10   METs 1.5     Track   Laps 9   Minutes 10   METs 2.57     Prescription Details   Frequency (times per week) 3   Duration Progress to 30 minutes of continuous aerobic without signs/symptoms of physical distress     Intensity   THRR 40-80% of Max Heartrate 56-113   Ratings of Perceived Exertion 11-13   Perceived Dyspnea 0-4     Progression   Progression Continue to progress workloads to maintain intensity without signs/symptoms of physical distress.     Resistance Training   Training Prescription Yes   Weight 1lb   Reps 10-15      Perform Capillary Blood Glucose checks as needed.  Exercise Prescription Changes:   Exercise Comments:   Exercise Goals and Review:      Exercise Goals    Row Name 01/11/17 1127 01/11/17 1136           Exercise Goals   Increase Physical Activity Yes  -      Intervention Provide advice, education, support and counseling about physical activity/exercise needs.;Develop an individualized exercise prescription for aerobic and resistive training based on initial evaluation findings, risk stratification, comorbidities and participant's personal goals.  -      Expected Outcomes Achievement of increased cardiorespiratory fitness and enhanced flexibility, muscular endurance and strength shown through measurements of functional capacity and personal statement of participant.  -      Increase Strength and Stamina Yes -  increase walking tolerance without symptoms of SOB. Long term be able to walk in airport in order to travel to [place] in 2 years without SOB      Intervention Provide advice, education, support and counseling about physical activity/exercise needs.;Develop an individualized exercise prescription for aerobic and resistive training based on initial evaluation findings, risk  stratification, comorbidities and participant's personal goals.  -      Expected Outcomes Achievement of increased cardiorespiratory fitness and enhanced flexibility, muscular endurance and strength shown through measurements of functional capacity and personal statement of participant.  -         Exercise Goals Re-Evaluation :    Discharge Exercise Prescription (Final Exercise Prescription Changes):   Nutrition:  Target Goals: Understanding of nutrition guidelines, daily intake of sodium 1500mg , cholesterol 200mg , calories 30% from fat and 7% or less from saturated fats, daily to have 5 or more servings of fruits and vegetables.  Biometrics:     Pre Biometrics - 01/11/17 1125      Pre Biometrics   Waist Circumference 37.5 inches   Hip Circumference 44 inches   Waist to Hip Ratio 0.85 %   Triceps Skinfold 26 mm   % Body Fat 40.3 %   Grip Strength 23.5 kg   Flexibility 16 in   Single Leg Stand 30 seconds       Nutrition Therapy Plan and Nutrition Goals:   Nutrition Discharge: Nutrition Scores:   Nutrition Goals Re-Evaluation:   Nutrition Goals Re-Evaluation:   Nutrition Goals Discharge (Final Nutrition Goals Re-Evaluation):   Psychosocial: Target Goals: Acknowledge presence or absence of significant depression and/or stress, maximize coping skills, provide positive support system. Participant is able to verbalize types and ability to use techniques and skills needed for reducing stress and depression.  Initial Review & Psychosocial Screening:     Initial Psych Review & Screening - 01/11/17 1645      Family  Dynamics   Good Support System? Yes     Barriers   Psychosocial barriers to participate in program There are no identifiable barriers or psychosocial needs.     Screening Interventions   Interventions Encouraged to exercise      Quality of Life Scores:     Quality of Life - 01/11/17 0924      Quality of Life Scores   Health/Function Pre  28.88 %   Socioeconomic Pre 28.58 %   Psych/Spiritual Pre 29.17 %   Family Pre 28.5 %   GLOBAL Pre 28.82 %      PHQ-9: Recent Review Flowsheet Data    There is no flowsheet data to display.     Interpretation of Total Score  Total Score Depression Severity:  1-4 = Minimal depression, 5-9 = Mild depression, 10-14 = Moderate depression, 15-19 = Moderately severe depression, 20-27 = Severe depression   Psychosocial Evaluation and Intervention:   Psychosocial Re-Evaluation:   Psychosocial Discharge (Final Psychosocial Re-Evaluation):   Vocational Rehabilitation: Provide vocational rehab assistance to qualifying candidates.   Vocational Rehab Evaluation & Intervention:     Vocational Rehab - 01/11/17 1650      Initial Vocational Rehab Evaluation & Intervention   Assessment shows need for Vocational Rehabilitation No      Education: Education Goals: Education classes will be provided on a weekly basis, covering required topics. Participant will state understanding/return demonstration of topics presented.  Learning Barriers/Preferences:     Learning Barriers/Preferences - 01/11/17 0848      Learning Barriers/Preferences   Learning Barriers Sight;Hearing   Learning Preferences Written Material      Education Topics: Count Your Pulse:  -Group instruction provided by verbal instruction, demonstration, patient participation and written materials to support subject.  Instructors address importance of being able to find your pulse and how to count your pulse when at home without a heart monitor.  Patients get hands on experience counting their pulse with staff help and individually.   Heart Attack, Angina, and Risk Factor Modification:  -Group instruction provided by verbal instruction, video, and written materials to support subject.  Instructors address signs and symptoms of angina and heart attacks.    Also discuss risk factors for heart disease and how to make  changes to improve heart health risk factors.   Functional Fitness:  -Group instruction provided by verbal instruction, demonstration, patient participation, and written materials to support subject.  Instructors address safety measures for doing things around the house.  Discuss how to get up and down off the floor, how to pick things up properly, how to safely get out of a chair without assistance, and balance training.   Meditation and Mindfulness:  -Group instruction provided by verbal instruction, patient participation, and written materials to support subject.  Instructor addresses importance of mindfulness and meditation practice to help reduce stress and improve awareness.  Instructor also leads participants through a meditation exercise.    Stretching for Flexibility and Mobility:  -Group instruction provided by verbal instruction, patient participation, and written materials to support subject.  Instructors lead participants through series of stretches that are designed to increase flexibility thus improving mobility.  These stretches are additional exercise for major muscle groups that are typically performed during regular warm up and cool down.   Hands Only CPR Anytime:  -Group instruction provided by verbal instruction, video, patient participation and written materials to support subject.  Instructors co-teach with AHA video for hands only CPR.  Participants get hands on  experience with mannequins.   Nutrition I class: Heart Healthy Eating:  -Group instruction provided by PowerPoint slides, verbal discussion, and written materials to support subject matter. The instructor gives an explanation and review of the Therapeutic Lifestyle Changes diet recommendations, which includes a discussion on lipid goals, dietary fat, sodium, fiber, plant stanol/sterol esters, sugar, and the components of a well-balanced, healthy diet.   Nutrition II class: Lifestyle Skills:  -Group instruction  provided by PowerPoint slides, verbal discussion, and written materials to support subject matter. The instructor gives an explanation and review of label reading, grocery shopping for heart health, heart healthy recipe modifications, and ways to make healthier choices when eating out.   Diabetes Question & Answer:  -Group instruction provided by PowerPoint slides, verbal discussion, and written materials to support subject matter. The instructor gives an explanation and review of diabetes co-morbidities, pre- and post-prandial blood glucose goals, pre-exercise blood glucose goals, signs, symptoms, and treatment of hypoglycemia and hyperglycemia, and foot care basics.   Diabetes Blitz:  -Group instruction provided by PowerPoint slides, verbal discussion, and written materials to support subject matter. The instructor gives an explanation and review of the physiology behind type 1 and type 2 diabetes, diabetes medications and rational behind using different medications, pre- and post-prandial blood glucose recommendations and Hemoglobin A1c goals, diabetes diet, and exercise including blood glucose guidelines for exercising safely.    Portion Distortion:  -Group instruction provided by PowerPoint slides, verbal discussion, written materials, and food models to support subject matter. The instructor gives an explanation of serving size versus portion size, changes in portions sizes over the last 20 years, and what consists of a serving from each food group.   Stress Management:  -Group instruction provided by verbal instruction, video, and written materials to support subject matter.  Instructors review role of stress in heart disease and how to cope with stress positively.     Exercising on Your Own:  -Group instruction provided by verbal instruction, power point, and written materials to support subject.  Instructors discuss benefits of exercise, components of exercise, frequency and intensity of  exercise, and end points for exercise.  Also discuss use of nitroglycerin and activating EMS.  Review options of places to exercise outside of rehab.  Review guidelines for sex with heart disease.   Cardiac Drugs I:  -Group instruction provided by verbal instruction and written materials to support subject.  Instructor reviews cardiac drug classes: antiplatelets, anticoagulants, beta blockers, and statins.  Instructor discusses reasons, side effects, and lifestyle considerations for each drug class.   Cardiac Drugs II:  -Group instruction provided by verbal instruction and written materials to support subject.  Instructor reviews cardiac drug classes: angiotensin converting enzyme inhibitors (ACE-I), angiotensin II receptor blockers (ARBs), nitrates, and calcium channel blockers.  Instructor discusses reasons, side effects, and lifestyle considerations for each drug class.   Anatomy and Physiology of the Circulatory System:  -Group instruction provided by verbal instruction, video, and written materials to support subject.  Reviews functional anatomy of heart, how it relates to various diagnoses, and what role the heart plays in the overall system.   Knowledge Questionnaire Score:     Knowledge Questionnaire Score - 01/11/17 0924      Knowledge Questionnaire Score   Pre Score 16/24      Core Components/Risk Factors/Patient Goals at Admission:     Personal Goals and Risk Factors at Admission - 01/11/17 1127      Core Components/Risk Factors/Patient Goals on Admission  Weight Management Yes;Weight Maintenance;Weight Loss;Obesity   Intervention Weight Management: Develop a combined nutrition and exercise program designed to reach desired caloric intake, while maintaining appropriate intake of nutrient and fiber, sodium and fats, and appropriate energy expenditure required for the weight goal.;Weight Management: Provide education and appropriate resources to help participant work on and  attain dietary goals.;Weight Management/Obesity: Establish reasonable short term and long term weight goals.;Obesity: Provide education and appropriate resources to help participant work on and attain dietary goals.   Expected Outcomes Short Term: Continue to assess and modify interventions until short term weight is achieved;Long Term: Adherence to nutrition and physical activity/exercise program aimed toward attainment of established weight goal;Weight Maintenance: Understanding of the daily nutrition guidelines, which includes 25-35% calories from fat, 7% or less cal from saturated fats, less than 200mg  cholesterol, less than 1.5gm of sodium, & 5 or more servings of fruits and vegetables daily;Weight Loss: Understanding of general recommendations for a balanced deficit meal plan, which promotes 1-2 lb weight loss per week and includes a negative energy balance of [phone] kcal/d;Understanding recommendations for meals to include 15-35% energy as protein, 25-35% energy from fat, 35-60% energy from carbohydrates, less than 200mg  of dietary cholesterol, 20-35 gm of total fiber daily;Understanding of distribution of calorie intake throughout the day with the consumption of 4-5 meals/snacks   Improve shortness of breath with ADL's Yes   Intervention Provide education, individualized exercise plan and daily activity instruction to help decrease symptoms of SOB with activities of daily living.   Expected Outcomes Short Term: Achieves a reduction of symptoms when performing activities of daily living.   Hypertension Yes   Intervention Provide education on lifestyle modifcations including regular physical activity/exercise, weight management, moderate sodium restriction and increased consumption of fresh fruit, vegetables, and low fat dairy, alcohol moderation, and smoking cessation.;Monitor prescription use compliance.   Expected Outcomes Short Term: Continued assessment and intervention until BP is < 140/41mm HG  in hypertensive participants. < 130/44mm HG in hypertensive participants with diabetes, heart failure or chronic kidney disease.;Long Term: Maintenance of blood pressure at goal levels.   Lipids Yes   Intervention Provide education and support for participant on nutrition & aerobic/resistive exercise along with prescribed medications to achieve LDL 70mg , HDL >40mg .   Expected Outcomes Short Term: Participant states understanding of desired cholesterol values and is compliant with medications prescribed. Participant is following exercise prescription and nutrition guidelines.;Long Term: Cholesterol controlled with medications as prescribed, with individualized exercise RX and with personalized nutrition plan. Value goals: LDL < 70mg , HDL > 40 mg.   Personal Goal Other Yes   Personal Goal Be able to walk through airport in 2 years in order to travel to [place] without symptoms of SOB   Intervention Provide exercise programming to assist with improving cardiorespiratory fitness   Expected Outcomes Pt will increase walking tolerance with improved symptoms of SOB      Core Components/Risk Factors/Patient Goals Review:    Core Components/Risk Factors/Patient Goals at Discharge (Final Review):    ITP Comments:     ITP Comments    Row Name 01/11/17 0814           ITP Comments Mecical Director- Dr. Fransico Him, MD          Comments: [place] attended orientation from 0800 to 1000 to review rules and guidelines for program. Completed 6 minute walk test, Intitial ITP, and exercise prescription.  VSS. Telemetry-Atrial Fibrillation this is chronic. Judeth Porch did complain about mild shortness of breath this went  away after resting.Will continue to monitor the patient throughout  the program.Maria Venetia Maxon, RN,BSN 01/11/2017 4:52 PM .

## 2017-01-11 NOTE — Progress Notes (Signed)
Cardiac Rehab Medication Review by a Pharmacist  Does the patient  feel that his/her medications are working for him/her?  no  Has the patient been experiencing any side effects to the medications prescribed?  no  Does the patient measure his/her own blood pressure or blood glucose at home?  yes   Does the patient have any problems obtaining medications due to transportation or finances?   no  Understanding of regimen: good Understanding of indications: good Potential of compliance: good    Pharmacist comments: Patient presents in good spirits, ambulating without assistance. Per patient, she only wants to take brand name xarelto and states that it is affordable for her. No issues with bleeding noted. No longer taking steroid creams -using vasoline instead which seems to work better for rash on legs. Takes lasix only when flying. Reviewed all doses, indications, and dosing schedules with patient. No concerns expressed.    Carlean Jews, Pharm.D. PGY1 Pharmacy Resident 3/27/20189:04 AM Pager [phone]

## 2017-01-14 ENCOUNTER — Ambulatory Visit (HOSPITAL_COMMUNITY): Payer: Medicare Other | Attending: Cardiology

## 2017-01-14 ENCOUNTER — Encounter: Payer: Self-pay | Admitting: Cardiovascular Disease

## 2017-01-14 ENCOUNTER — Ambulatory Visit (INDEPENDENT_AMBULATORY_CARE_PROVIDER_SITE_OTHER): Payer: Medicare Other | Admitting: Cardiovascular Disease

## 2017-01-14 ENCOUNTER — Other Ambulatory Visit: Payer: Self-pay

## 2017-01-14 VITALS — BP 130/84 | HR 86 | Ht 67.0 in | Wt 181.2 lb

## 2017-01-14 DIAGNOSIS — I2510 Atherosclerotic heart disease of native coronary artery without angina pectoris: Secondary | ICD-10-CM

## 2017-01-14 DIAGNOSIS — Z9882 Breast implant status: Secondary | ICD-10-CM | POA: Diagnosis not present

## 2017-01-14 DIAGNOSIS — I10 Essential (primary) hypertension: Secondary | ICD-10-CM | POA: Diagnosis not present

## 2017-01-14 DIAGNOSIS — Z952 Presence of prosthetic heart valve: Secondary | ICD-10-CM | POA: Diagnosis not present

## 2017-01-14 DIAGNOSIS — I071 Rheumatic tricuspid insufficiency: Secondary | ICD-10-CM | POA: Insufficient documentation

## 2017-01-14 DIAGNOSIS — Z87891 Personal history of nicotine dependence: Secondary | ICD-10-CM | POA: Diagnosis not present

## 2017-01-14 DIAGNOSIS — I371 Nonrheumatic pulmonary valve insufficiency: Secondary | ICD-10-CM | POA: Insufficient documentation

## 2017-01-14 DIAGNOSIS — I350 Nonrheumatic aortic (valve) stenosis: Secondary | ICD-10-CM | POA: Diagnosis not present

## 2017-01-14 DIAGNOSIS — I4891 Unspecified atrial fibrillation: Secondary | ICD-10-CM | POA: Insufficient documentation

## 2017-01-14 DIAGNOSIS — E785 Hyperlipidemia, unspecified: Secondary | ICD-10-CM | POA: Insufficient documentation

## 2017-01-14 NOTE — Patient Instructions (Signed)
Medication Instructions:  Your physician recommends that you continue on your current medications as directed. Please refer to the Current Medication list given to you today.   Labwork: none  Testing/Procedures: Your physician has requested that you have an echocardiogram. Echocardiography is a painless test that uses sound waves to create images of your heart. It provides your doctor with information about the size and shape of your heart and how well your heart's chambers and valves are working. This procedure takes approximately one hour. There are no restrictions for this procedure. To be done in 11 months on day of appointment with Dr. Angelena Form  Follow-Up: Your physician wants you to follow-up in: 6 months with Dr.Turner and 11 months with Dr. Shirley Friar will receive a reminder letter in the mail two months in advance. If you don't receive a letter, please call our office to schedule the follow-up appointment.   Any Other Special Instructions Will Be Listed Below (If Applicable).     If you need a refill on your cardiac medications before your next appointment, please call your pharmacy.

## 2017-01-14 NOTE — Progress Notes (Signed)
Valve Clinic Follow up Note  Chief Complaint  Patient presents with  . Follow-up   History of Present Illness: 81 yo female with history of CAD, HTN, HLD, chronic diastolic CHF, persistent atrial fibrillation and severe aortic stenosis now s/p TAVR who is here today for one month TAVR follow up. She has been followed in our office by Dr. Radford Pax. She had progressive aortic stenosis with dyspnea and underwent TAVR with 26 mm Edwards Sapien 3 bioprosthetic aortic valve from the right femoral approach on 12/14/16. She did well following the procedure.   She is here today for one month follow up.   Primary Care Physician: Jonathon Bellows, MD Primary Cardiologist: Dr. Radford Pax Referring: Dr. Radford Pax    Past Medical History:  Diagnosis Date  . Anginal pain ([place])   . Aortic stenosis    severe AS with moderate AR by echo with normal LVF  . Arthritis   . CAD in native artery, non obstructive cath 11/2016.  12/16/2016  . Cancer [facility])    left breast mastectomy(cancer)-surgery only  . Chronic diastolic congestive heart failure ([place])   . Complication of anesthesia    extremely claustophobic(only occurs with narcotic medications as stated per pt)  . Diverticulosis   . Fibromyalgia   . GERD (gastroesophageal reflux disease)   . Hard of hearing    wears hearing aides   . History of breast cancer no recurrence   dx 2004   s/p  left breast mastectomy w/ node dissection/   no chemo or radiation  . History of TIA (transient ischemic attack)    per scan--  no residual  . Hyperlipidemia   . Hypertension   . Nocturia   . OSA on CPAP     severe/  AHI 34/hr  . Permanent atrial fibrillation (HCC)    on Xarelto for CHADS2VASC score of 5  . Pulmonary HTN    PASP 51mmHg by echo 11/2016  . Stroke ([place])    tia  . Urge urinary incontinence   . Wears glasses     Past Surgical History:  Procedure Laterality Date  . ABDOMINOPLASTY  1987  . APPENDECTOMY  1965  . BREAST SURGERY Left 2004   mastectomy     . CARDIAC CATHETERIZATION N/A 11/19/2016   Procedure: Right/Left Heart Cath and Coronary Angiography;  Surgeon: Troy Sine, MD;  Location: [place] [facility];  Service: Cardiovascular;  Laterality: N/A;  . CARPAL TUNNEL RELEASE Right 07-08-2004  . COLONOSCOPY WITH ESOPHAGOGASTRODUODENOSCOPY (EGD)  2006  . EXPLANTATION BREAST IMPLANT AND DEBRIDEMENT Left 09/15/2005  . INTERSTIM IMPLANT PLACEMENT N/A 08/22/2014   Procedure: Barrie Lyme IMPLANT STAGE ONE/TWO;  Surgeon: Reece Packer, MD;  Location: [facility];  Service: Urology;  Laterality: N/A;  . JOINT REPLACEMENT Left 2017   knee replacement  . MASTECTOMY    . MASTECTOMY, MODIFIED RADICAL W/RECONSTRUCTION  09-16-2003   left breast W/ SLN DISSECTION (pt states had 13 operations for implant infection   . REPLACE TISSUE EXPANDER LEFT BREAST AND TOTAL CAPSULECTOMY  08-05-2006  . REVISION BREAST RECONSTRUCTION Left 11-11-2004   12-03-2004  DRAINAGE SEREMA LEFT CHEST  . TEE WITHOUT CARDIOVERSION N/A 12/14/2016   Procedure: TRANSESOPHAGEAL ECHOCARDIOGRAM (TEE);  Surgeon: Burnell Blanks, MD;  Location: [place];  Service: Open Heart Surgery;  Laterality: N/A;  . TOTAL HIP ARTHROPLASTY Left 07/17/2013   Procedure: LEFT TOTAL HIP ARTHROPLASTY ANTERIOR APPROACH;  Surgeon: Mauri Pole, MD;  Location: [facility];  Service: Orthopedics;  Laterality: Left;  . TOTAL HIP ARTHROPLASTY Right 01/28/2015   Procedure: RIGHT TOTAL HIP ARTHROPLASTY ANTERIOR APPROACH;  Surgeon: Paralee Cancel, MD;  Location: [facility];  Service: Orthopedics;  Laterality: Right;  . TOTAL KNEE ARTHROPLASTY Left 11/11/2015   Procedure: LEFT TOTAL KNEE ARTHROPLASTY;  Surgeon: Paralee Cancel, MD;  Location: [facility];  Service: Orthopedics;  Laterality: Left;  . TOTAL SHOULDER ARTHROPLASTY  08/11/2012   Procedure: TOTAL SHOULDER ARTHROPLASTY;  Surgeon: Augustin Schooling, MD;  Location: [place];  Service: Orthopedics;  Laterality: Right;  RIGHT  TOTAL SHOULDER  ARTHROPLASTY   .  TRANSCATHETER AORTIC VALVE REPLACEMENT, TRANSFEMORAL N/A 12/14/2016   Procedure: TRANSCATHETER AORTIC VALVE REPLACEMENT, TRANSFEMORAL;  Surgeon: Burnell Blanks, MD;  Location: [place];  Service: Open Heart Surgery;  Laterality: N/A;  . TRANSTHORACIC ECHOCARDIOGRAM  02-25-2014   moderate LVH/  ef 55-60%/  mod. to sev. calcification AV with mild to moderate AV stenosis/  mild to mod. AR and MR/ mild PR/   moderate to severe LAE  . TUBAL LIGATION  1973    Current Outpatient Prescriptions  Medication Sig Dispense Refill  . acetaminophen (TYLENOL) 325 MG tablet Take 650 mg by mouth 2 (two) times daily.    Marland Kitchen amoxicillin (AMOXIL) 500 MG capsule TAKE 4 CAPSULES BY MOUTH 1 HOUR BEFORE DENTAL APPT FOR PREMED  0  . atorvastatin (LIPITOR) 20 MG tablet Take 20 mg by mouth daily.    . Calcium Carbonate-Vitamin D (CALCIUM-VITAMIN D) 500-200 MG-UNIT tablet Take 1 tablet by mouth daily.    Marland Kitchen diltiazem (TIAZAC) 360 MG 24 hr capsule Take 360 mg by mouth every evening.    . furosemide (LASIX) 20 MG tablet Take 20 mg by mouth as needed.    Marland Kitchen glucosamine-chondroitin 500-400 MG tablet Take 1 tablet by mouth 2 (two) times daily.    Marland Kitchen losartan (COZAAR) 50 MG tablet Take 50 mg by mouth daily.    . Melatonin 10 MG TABS Take 1 tablet by mouth at bedtime.     . nitroGLYCERIN (NITROSTAT) 0.4 MG SL tablet Place 0.4 mg under the tongue every 5 (five) minutes as needed for chest pain. For chest pain     . omeprazole (PRILOSEC) 20 MG capsule Take 20 mg by mouth daily.     . polycarbophil (FIBER-CAPS) 625 MG tablet Take 625 mg by mouth daily.    . predniSONE (DELTASONE) 5 MG tablet Take 5 mg by mouth daily with breakfast.    . rivaroxaban (XARELTO) 20 MG TABS tablet Take 20 mg by mouth daily with supper.     No current facility-administered medications for this visit.     Allergies  Allergen Reactions  . Beta Adrenergic Blockers Shortness Of Breath  . Betapace [Sotalol Hcl] Shortness Of Breath  . Other      Narcotic pain medications - anxiety attacks  . Oxycodone Shortness Of Breath    Tolerates Dilaudid, Fentanyl  . Clonidine Derivatives Palpitations    bradycardia  . Crestor [Rosuvastatin Calcium] Other (See Comments)    MYALGIAS  . Adhesive [Tape] Rash     bandaids   . Latex Rash    Per patient-bandaids- no problems with gloves  . Tramadol Anxiety    Can take if absolutely necessary    Social History   Social History  . Marital status: Married    Spouse name: N/A  . Number of children: N/A  . Years of education: N/A   Occupational History  . Not on file.  Social History Main Topics  . Smoking status: Former Smoker    Packs/day: 0.25    Years: 8.00    Types: Cigarettes    Quit date: 10/18/1966  . Smokeless tobacco: Never Used  . Alcohol use No  . Drug use: No  . Sexual activity: Yes   Other Topics Concern  . Not on file   Social History Narrative  . No narrative on file    Family History  Problem Relation Age of Onset  . Heart disease Mother   . Heart disease Father     Review of Systems:  As stated in the HPI and otherwise negative.   BP 130/84   Pulse 86   Ht 5\' 7"  (1.702 m)   Wt 181 lb 3.2 oz (82.2 kg)   SpO2 93%   BMI 28.38 kg/m   Physical Examination: General: Well developed, well nourished, NAD  HEENT: OP clear, mucus membranes moist  SKIN: warm, dry. No rashes. Neuro: No focal deficits  Musculoskeletal: Muscle strength 5/5 all ext  Psychiatric: Mood and affect normal  Neck: No JVD, no carotid bruits, no thyromegaly, no lymphadenopathy.  Lungs:Clear bilaterally, no wheezes, rhonci, crackles Cardiovascular: Regular rate and rhythm. No murmurs, gallops or rubs. Abdomen:Soft. Bowel sounds present. Non-tender.  Extremities: No lower extremity edema. Pulses are 2 + in the bilateral DP/PT.  Echo 01/14/17: Normal LV function. Bioprosthetic AV working well. No AI.   EKG:  EKG is not ordered today. The ekg ordered today demonstrates   Recent  Labs: 11/18/2016: B Natriuretic Peptide 352.5 12/10/2016: ALT 18 12/15/2016: Magnesium 1.8 12/16/2016: Hemoglobin 13.1 12/23/2016: BUN 21; Creatinine, Ser 0.95; Platelets 229; Potassium 4.3; Sodium 144   Lipid Panel No results found for: CHOL, TRIG, HDL, CHOLHDL, VLDL, LDLCALC, LDLDIRECT   Wt Readings from Last 3 Encounters:  01/14/17 181 lb 3.2 oz (82.2 kg)  01/11/17 182 lb 12.2 oz (82.9 kg)  12/23/16 180 lb 1.9 oz (81.7 kg)     Other studies Reviewed: Additional studies/ records that were reviewed today include: I have reviewed her echo images pesonally Review of the above records demonstrates:   Assessment and Plan:   1. Severe aortic stenosis: She is now one month s/p TAVR. She is doing well. She is feeling much better. She is NYHA class 1. Echo today shows normal LV function with normally functioning aortic valve replacement. Return to valve clinic in one year with echo.   Current medicines are reviewed at length with the patient today.  The patient does not have concerns regarding medicines.  The following changes have been made:  no change  Labs/ tests ordered today include:  No orders of the defined types were placed in this encounter.    Disposition:   FU with me in one year with echo.    Signed, Lauree Chandler, MD 01/14/2017 4:35 PM    [facility] [address], [place], [place]  [address] Phone: [phone]; Fax: [phone]

## 2017-01-17 ENCOUNTER — Encounter (HOSPITAL_COMMUNITY): Payer: Medicare Other

## 2017-01-17 ENCOUNTER — Encounter (HOSPITAL_COMMUNITY)
Admission: RE | Admit: 2017-01-17 | Discharge: 2017-01-17 | Disposition: A | Discharge status: Home / Self Care | Payer: Medicare Other | Source: Ambulatory Visit | Attending: Cardiovascular Disease | Admitting: Cardiovascular Disease

## 2017-01-17 DIAGNOSIS — I509 Heart failure, unspecified: Secondary | ICD-10-CM | POA: Insufficient documentation

## 2017-01-17 DIAGNOSIS — Z87891 Personal history of nicotine dependence: Secondary | ICD-10-CM | POA: Diagnosis not present

## 2017-01-17 DIAGNOSIS — Z952 Presence of prosthetic heart valve: Secondary | ICD-10-CM

## 2017-01-17 NOTE — Progress Notes (Signed)
Daily Session Note  Patient Details  Name: [name] MRN: [id number] Date of Birth: 09-01-1936 Referring Provider:     CARDIAC REHAB PHASE II ORIENTATION from 01/11/2017 in [place]  Referring Provider  Darlina Guys MD and Fransico Him MD      Encounter Date: 01/17/2017  Check In:     Session Check In - 01/17/17 1031      Check-In   Location [facility]   Staff Present Cleda Mccreedy, MS, Exercise Physiologist;Molly diVincenzo, MS, ACSM RCEP, Exercise Physiologist;Maria Whitaker, RN, BSN;Amber Fair, MS, ACSM RCEP, Exercise Physiologist;Joann Rion, RN, BSN   Supervising physician immediately available to respond to emergencies [facility] immediately available   Physician(s) Dr. Doyle Askew   Medication changes reported     No   Fall or balance concerns reported    No   Tobacco Cessation No Change   Warm-up and Cool-down Performed as group-led instruction   Resistance Training Performed Yes   VAD Patient? No     Pain Assessment   Currently in Pain? No/denies   Multiple Pain Sites No      Capillary Blood Glucose: No results found for this or any previous visit (from the past 24 hour(s)).    History  Smoking Status  . Former Smoker  . Packs/day: 0.25  . Years: 8.00  . Types: Cigarettes  . Quit date: 10/18/1966  Smokeless Tobacco  . Never Used    Goals Met:  Exercise tolerated well  Goals Unmet:  Not Applicable  Comments: [name] started cardiac rehab today.  Pt tolerated light exercise without difficulty. VSS, telemetry-Chronic Atrial Fibrillation, asymptomatic.  Medication list reconciled. Pt denies barriers to medicaiton compliance.  PSYCHOSOCIAL ASSESSMENT:  PHQ-0. Pt exhibits positive coping skills, hopeful outlook with supportive family. No psychosocial needs identified at this time, no psychosocial interventions necessary.    Pt enjoys playing golf and working in her yard.   Pt oriented to  exercise equipment and routine.    Understanding verbalized.Jacqueline Pall, RN,BSN 01/17/2017 12:40 PM  Dr. Fransico Him is Medical Director for Cardiac Rehab at [facility].

## 2017-01-19 ENCOUNTER — Encounter (HOSPITAL_COMMUNITY): Payer: Medicare Other

## 2017-01-19 ENCOUNTER — Encounter (HOSPITAL_COMMUNITY)
Admission: RE | Admit: 2017-01-19 | Discharge: 2017-01-19 | Disposition: A | Discharge status: Home / Self Care | Payer: Medicare Other | Source: Ambulatory Visit | Attending: Cardiovascular Disease | Admitting: Cardiovascular Disease

## 2017-01-19 DIAGNOSIS — Z87891 Personal history of nicotine dependence: Secondary | ICD-10-CM | POA: Diagnosis not present

## 2017-01-19 DIAGNOSIS — I509 Heart failure, unspecified: Secondary | ICD-10-CM | POA: Diagnosis not present

## 2017-01-19 DIAGNOSIS — Z952 Presence of prosthetic heart valve: Secondary | ICD-10-CM

## 2017-01-19 NOTE — Progress Notes (Signed)
[name] 81 y.o. female Nutrition Note Spoke with pt. Nutrition Plan and Nutrition Survey goals reviewed with pt. Pt is following Step 2 of the Therapeutic Lifestyle Changes diet. Pt with dx of CHF. Per discussion, pt does not use canned/convenience foods often. Pt cooks most meals from scratch. Pt uses herbs to season food. Pt expressed understanding of the information reviewed. Pt aware of nutrition education classes offered and is unable to attend nutrition classes.  Lab Results  Component Value Date   HGBA1C 5.6 12/10/2016   Wt Readings from Last 3 Encounters:  01/14/17 181 lb 3.2 oz (82.2 kg)  01/11/17 182 lb 12.2 oz (82.9 kg)  12/23/16 180 lb 1.9 oz (81.7 kg)    Nutrition Diagnosis ? Food-and nutrition-related knowledge deficit related to lack of exposure to information as related to diagnosis of: ? CHF ? Overweight related to excessive energy intake as evidenced by a BMI of 28.7  Nutrition Intervention ? Pt's individual nutrition plan reviewed with pt. ? Benefits of adopting Therapeutic Lifestyle Changes discussed when Medficts reviewed. ? Pt to attend the Portion Distortion class ? Pt to attend the   ? Nutrition I class                  ? Nutrition II class  ? Continue client-centered nutrition education by RD, as part of interdisciplinary care. Goal(s) ? Pt to identify food quantities necessary to achieve weight loss of 6-18 lb (2.7-8 kg) at graduation from cardiac rehab.  Monitor and Evaluate progress toward nutrition goal with team. Derek Mound, M.Ed, RD, LDN, CDE 01/19/2017 11:08 AM

## 2017-01-21 ENCOUNTER — Encounter (HOSPITAL_COMMUNITY)
Admission: RE | Admit: 2017-01-21 | Discharge: 2017-01-21 | Disposition: A | Discharge status: Home / Self Care | Payer: Medicare Other | Source: Ambulatory Visit | Attending: Cardiovascular Disease | Admitting: Cardiovascular Disease

## 2017-01-21 ENCOUNTER — Encounter (HOSPITAL_COMMUNITY): Payer: Medicare Other

## 2017-01-21 DIAGNOSIS — Z952 Presence of prosthetic heart valve: Secondary | ICD-10-CM

## 2017-01-21 DIAGNOSIS — Z87891 Personal history of nicotine dependence: Secondary | ICD-10-CM | POA: Diagnosis not present

## 2017-01-21 DIAGNOSIS — I509 Heart failure, unspecified: Secondary | ICD-10-CM | POA: Diagnosis not present

## 2017-01-24 ENCOUNTER — Encounter (HOSPITAL_COMMUNITY): Payer: Medicare Other

## 2017-01-24 ENCOUNTER — Encounter (HOSPITAL_COMMUNITY)
Admission: RE | Admit: 2017-01-24 | Discharge: 2017-01-24 | Disposition: A | Discharge status: Home / Self Care | Payer: Medicare Other | Source: Ambulatory Visit | Attending: Cardiovascular Disease | Admitting: Cardiovascular Disease

## 2017-01-24 DIAGNOSIS — Z87891 Personal history of nicotine dependence: Secondary | ICD-10-CM | POA: Diagnosis not present

## 2017-01-24 DIAGNOSIS — Z952 Presence of prosthetic heart valve: Secondary | ICD-10-CM

## 2017-01-24 DIAGNOSIS — I509 Heart failure, unspecified: Secondary | ICD-10-CM | POA: Diagnosis not present

## 2017-01-26 ENCOUNTER — Encounter (HOSPITAL_COMMUNITY)
Admission: RE | Admit: 2017-01-26 | Discharge: 2017-01-26 | Disposition: A | Discharge status: Home / Self Care | Payer: Medicare Other | Source: Ambulatory Visit | Attending: Cardiovascular Disease | Admitting: Cardiovascular Disease

## 2017-01-26 ENCOUNTER — Encounter (HOSPITAL_COMMUNITY): Payer: Medicare Other

## 2017-01-26 DIAGNOSIS — Z87891 Personal history of nicotine dependence: Secondary | ICD-10-CM | POA: Diagnosis not present

## 2017-01-26 DIAGNOSIS — I509 Heart failure, unspecified: Secondary | ICD-10-CM | POA: Diagnosis not present

## 2017-01-26 DIAGNOSIS — Z952 Presence of prosthetic heart valve: Secondary | ICD-10-CM

## 2017-01-28 ENCOUNTER — Encounter (HOSPITAL_COMMUNITY): Payer: Medicare Other

## 2017-01-28 DIAGNOSIS — L570 Actinic keratosis: Secondary | ICD-10-CM | POA: Diagnosis not present

## 2017-01-28 DIAGNOSIS — L820 Inflamed seborrheic keratosis: Secondary | ICD-10-CM | POA: Diagnosis not present

## 2017-01-28 DIAGNOSIS — L821 Other seborrheic keratosis: Secondary | ICD-10-CM | POA: Diagnosis not present

## 2017-01-28 DIAGNOSIS — L814 Other melanin hyperpigmentation: Secondary | ICD-10-CM | POA: Diagnosis not present

## 2017-01-31 ENCOUNTER — Encounter (HOSPITAL_COMMUNITY): Payer: Medicare Other

## 2017-01-31 ENCOUNTER — Telehealth (HOSPITAL_COMMUNITY): Payer: Self-pay | Admitting: Family Medicine

## 2017-02-02 ENCOUNTER — Encounter (HOSPITAL_COMMUNITY): Payer: Medicare Other

## 2017-02-04 ENCOUNTER — Encounter (HOSPITAL_COMMUNITY): Payer: Medicare Other

## 2017-02-07 ENCOUNTER — Encounter (HOSPITAL_COMMUNITY)
Admission: RE | Admit: 2017-02-07 | Discharge: 2017-02-07 | Disposition: A | Discharge status: Home / Self Care | Payer: Medicare Other | Source: Ambulatory Visit | Attending: Cardiovascular Disease | Admitting: Cardiovascular Disease

## 2017-02-07 ENCOUNTER — Encounter (HOSPITAL_COMMUNITY): Payer: Medicare Other

## 2017-02-07 DIAGNOSIS — I509 Heart failure, unspecified: Secondary | ICD-10-CM | POA: Diagnosis not present

## 2017-02-07 DIAGNOSIS — Z87891 Personal history of nicotine dependence: Secondary | ICD-10-CM | POA: Diagnosis not present

## 2017-02-07 DIAGNOSIS — Z952 Presence of prosthetic heart valve: Secondary | ICD-10-CM

## 2017-02-07 NOTE — Progress Notes (Signed)
Reviewed home exercise guidelines with patient including endpoints, temperature precautions, target heart rate and rate of perceived exertion. Pt is currently walking 20 minutes daily and riding her stationary bike 10 minutes, twice per day as her mode of home exercise. Pt voices understanding of instructions given. Sol Passer, MS, ACSM CEP

## 2017-02-08 DIAGNOSIS — H2513 Age-related nuclear cataract, bilateral: Secondary | ICD-10-CM | POA: Diagnosis not present

## 2017-02-09 ENCOUNTER — Encounter (HOSPITAL_COMMUNITY)
Admission: RE | Admit: 2017-02-09 | Discharge: 2017-02-09 | Disposition: A | Discharge status: Home / Self Care | Payer: Medicare Other | Source: Ambulatory Visit | Attending: Cardiovascular Disease | Admitting: Cardiovascular Disease

## 2017-02-09 ENCOUNTER — Encounter (HOSPITAL_COMMUNITY): Payer: Medicare Other

## 2017-02-09 DIAGNOSIS — Z87891 Personal history of nicotine dependence: Secondary | ICD-10-CM | POA: Diagnosis not present

## 2017-02-09 DIAGNOSIS — I509 Heart failure, unspecified: Secondary | ICD-10-CM | POA: Diagnosis not present

## 2017-02-09 DIAGNOSIS — Z952 Presence of prosthetic heart valve: Secondary | ICD-10-CM

## 2017-02-09 NOTE — Progress Notes (Signed)
Cardiac Individual Treatment Plan  Patient Details  Name: [name] MRN: [id number] Date of Birth: [birth year] Referring Provider:     CARDIAC REHAB PHASE II ORIENTATION from 01/11/2017 in [place]  Referring Provider  Darlina Guys MD and Fransico Him MD      Initial Encounter Date:    CARDIAC REHAB PHASE II ORIENTATION from 01/11/2017 in [place]  Date  01/11/17  Referring Provider  Darlina Guys MD and Fransico Him MD      Visit Diagnosis: S/P TAVR (transcatheter aortic valve replacement)  Patient's Home Medications on Admission:  Current Outpatient Prescriptions:  .  acetaminophen (TYLENOL) 325 MG tablet, Take 650 mg by mouth 2 (two) times daily., Disp: , Rfl:  .  amoxicillin (AMOXIL) 500 MG capsule, TAKE 4 CAPSULES BY MOUTH 1 HOUR BEFORE DENTAL APPT FOR PREMED, Disp: , Rfl: 0 .  atorvastatin (LIPITOR) 20 MG tablet, Take 20 mg by mouth daily., Disp: , Rfl:  .  Calcium Carbonate-Vitamin D (CALCIUM-VITAMIN D) 500-200 MG-UNIT tablet, Take 1 tablet by mouth daily., Disp: , Rfl:  .  diltiazem (TIAZAC) 360 MG 24 hr capsule, Take 360 mg by mouth every evening., Disp: , Rfl:  .  furosemide (LASIX) 20 MG tablet, Take 20 mg by mouth as needed., Disp: , Rfl:  .  glucosamine-chondroitin 500-400 MG tablet, Take 1 tablet by mouth 2 (two) times daily., Disp: , Rfl:  .  losartan (COZAAR) 50 MG tablet, Take 50 mg by mouth daily., Disp: , Rfl:  .  Melatonin 10 MG TABS, Take 1 tablet by mouth at bedtime. , Disp: , Rfl:  .  nitroGLYCERIN (NITROSTAT) 0.4 MG SL tablet, Place 0.4 mg under the tongue every 5 (five) minutes as needed for chest pain. For chest pain , Disp: , Rfl:  .  omeprazole (PRILOSEC) 20 MG capsule, Take 20 mg by mouth daily. , Disp: , Rfl:  .  polycarbophil (FIBER-CAPS) 625 MG tablet, Take 625 mg by mouth daily., Disp: , Rfl:  .  predniSONE (DELTASONE) 5 MG tablet, Take 5 mg by mouth daily with  breakfast., Disp: , Rfl:  .  rivaroxaban (XARELTO) 20 MG TABS tablet, Take 20 mg by mouth daily with supper., Disp: , Rfl:   Past Medical History: Past Medical History:  Diagnosis Date  . Anginal pain ([place])   . Aortic stenosis    severe AS with moderate AR by echo with normal LVF  . Arthritis   . CAD in native artery, non obstructive cath 11/2016.  12/16/2016  . Cancer [facility])    left breast mastectomy(cancer)-surgery only  . Chronic diastolic congestive heart failure ([place])   . Complication of anesthesia    extremely claustophobic(only occurs with narcotic medications as stated per pt)  . Diverticulosis   . Fibromyalgia   . GERD (gastroesophageal reflux disease)   . Hard of hearing    wears hearing aides   . History of breast cancer no recurrence   dx 2004   s/p  left breast mastectomy w/ node dissection/   no chemo or radiation  . History of TIA (transient ischemic attack)    per scan--  no residual  . Hyperlipidemia   . Hypertension   . Nocturia   . OSA on CPAP     severe/  AHI 34/hr  . Permanent atrial fibrillation (HCC)    on Xarelto for CHADS2VASC score of 5  . Pulmonary HTN    PASP 29mmHg by  echo 11/2016  . Stroke ([place])    tia  . Urge urinary incontinence   . Wears glasses     Tobacco Use: History  Smoking Status  . Former Smoker  . Packs/day: 0.25  . Years: 8.00  . Types: Cigarettes  . Quit date: 10/18/1966  Smokeless Tobacco  . Never Used    Labs: Recent Review Flowsheet Data    Labs for ITP Cardiac and Pulmonary Rehab Latest Ref Rng & Units 12/10/2016 12/14/2016 12/14/2016 12/14/2016 12/14/2016   Hemoglobin A1c 4.8 - 5.6 % 5.6 - - - -   PHART 7.350 - 7.450 7.457(H) - - - 7.424   PCO2ART 32.0 - 48.0 mmHg 34.0 - - - 39.2   HCO3 20.0 - 28.0 mmol/L 23.6 - - - 25.8   TCO2 0 - 100 mmol/L - 24 24 26 27    O2SAT % 97.4 - - - 99.0      Capillary Blood Glucose: Lab Results  Component Value Date   GLUCAP 85 12/15/2016   GLUCAP 102 (H) 11/13/2011   GLUCAP 123 (H)  11/13/2011     Exercise Target Goals:    Exercise Program Goal: Individual exercise prescription set with THRR, safety & activity barriers. Participant demonstrates ability to understand and report RPE using BORG scale, to self-measure pulse accurately, and to acknowledge the importance of the exercise prescription.  Exercise Prescription Goal: Starting with aerobic activity 30 plus minutes a day, 3 days per week for initial exercise prescription. Provide home exercise prescription and guidelines that participant acknowledges understanding prior to discharge.  Activity Barriers & Risk Stratification:     Activity Barriers & Cardiac Risk Stratification - 01/11/17 1114      Activity Barriers & Cardiac Risk Stratification   Activity Barriers Fibromyalgia;Joint Problems;Deconditioning;Muscular Weakness;Other (comment);Left Knee Replacement;Left Hip Replacement   Comments L mastectomy, R shoulder arthroplasty      6 Minute Walk:     6 Minute Walk    Row Name 01/11/17 1113 01/11/17 1125       6 Minute Walk   Phase Initial  -    Distance 1489 feet  -    Walk Time 6 minutes  -    # of Rest Breaks 0  -    MPH  - 2.82    METS  - 1.99    RPE 11  -    Perceived Dyspnea  1  -    Symptoms Yes (comment)  -    Comments mild SOB  -    Resting HR 70 bpm  -    Resting BP 124/57  -    Max Ex. HR 98 bpm  -    Max Ex. BP 130/70  -    2 Minute Post BP 110/60  -       Oxygen Initial Assessment:   Oxygen Re-Evaluation:   Oxygen Discharge (Final Oxygen Re-Evaluation):   Initial Exercise Prescription:     Initial Exercise Prescription - 01/11/17 1100      Date of Initial Exercise RX and Referring Provider   Date 01/11/17   Referring Provider Darlina Guys MD and Fransico Him MD     Recumbant Bike   Level 1.5   Minutes 10   METs 1.3     NuStep   Level 2   SPM 60   Minutes 10   METs 1.5     Track   Laps 9   Minutes 10   METs 2.57     Prescription  Details    Frequency (times per week) 3   Duration Progress to 30 minutes of continuous aerobic without signs/symptoms of physical distress     Intensity   THRR 40-80% of Max Heartrate 56-113   Ratings of Perceived Exertion 11-13   Perceived Dyspnea 0-4     Progression   Progression Continue to progress workloads to maintain intensity without signs/symptoms of physical distress.     Resistance Training   Training Prescription Yes   Weight 1lb   Reps 10-15      Perform Capillary Blood Glucose checks as needed.  Exercise Prescription Changes:     Exercise Prescription Changes    Row Name 02/02/17 1700             Response to Exercise   Blood Pressure (Admit) 100/60       Blood Pressure (Exercise) 144/60       Blood Pressure (Exit) 114/66       Heart Rate (Admit) 56 bpm       Heart Rate (Exercise) 98 bpm       Heart Rate (Exit) 56 bpm       Rating of Perceived Exertion (Exercise) 11       Duration Progress to 45 minutes of aerobic exercise without signs/symptoms of physical distress       Intensity THRR unchanged         Progression   Progression Continue to progress workloads to maintain intensity without signs/symptoms of physical distress.       Average METs 2.5         Resistance Training   Training Prescription Yes       Weight 3lb       Reps 10-15         Recumbant Bike   Level 1.5       Minutes 10       METs 2         NuStep   Level 2       SPM 60       Minutes 10       METs 1.5         Track   Laps 11       Minutes 10       METs 2.92          Exercise Comments:     Exercise Comments    Row Name 02/04/17 1603 02/07/17 1035         Exercise Comments pt was doing well with exercise but she has been absent for the past week due to having skin cancer removed from her face.  She plans to return next week.  Reviewed home exercise guidelines with patient. She has a stationary bike at home, which she rides 10 minutes twice/day, and she walks 20 minutes  daily.         Exercise Goals and Review:     Exercise Goals    Row Name 01/11/17 1127 01/11/17 1136           Exercise Goals   Increase Physical Activity Yes  -      Intervention Provide advice, education, support and counseling about physical activity/exercise needs.;Develop an individualized exercise prescription for aerobic and resistive training based on initial evaluation findings, risk stratification, comorbidities and participant's personal goals.  -      Expected Outcomes Achievement of increased cardiorespiratory fitness and enhanced flexibility, muscular endurance and strength shown through measurements of functional capacity and personal statement  of participant.  -      Increase Strength and Stamina Yes -  increase walking tolerance without symptoms of SOB. Long term be able to walk in airport in order to travel to [place] in 2 years without SOB      Intervention Provide advice, education, support and counseling about physical activity/exercise needs.;Develop an individualized exercise prescription for aerobic and resistive training based on initial evaluation findings, risk stratification, comorbidities and participant's personal goals.  -      Expected Outcomes Achievement of increased cardiorespiratory fitness and enhanced flexibility, muscular endurance and strength shown through measurements of functional capacity and personal statement of participant.  -         Exercise Goals Re-Evaluation :    Discharge Exercise Prescription (Final Exercise Prescription Changes):     Exercise Prescription Changes - 02/02/17 1700      Response to Exercise   Blood Pressure (Admit) 100/60   Blood Pressure (Exercise) 144/60   Blood Pressure (Exit) 114/66   Heart Rate (Admit) 56 bpm   Heart Rate (Exercise) 98 bpm   Heart Rate (Exit) 56 bpm   Rating of Perceived Exertion (Exercise) 11   Duration Progress to 45 minutes of aerobic exercise without signs/symptoms of physical  distress   Intensity THRR unchanged     Progression   Progression Continue to progress workloads to maintain intensity without signs/symptoms of physical distress.   Average METs 2.5     Resistance Training   Training Prescription Yes   Weight 3lb   Reps 10-15     Recumbant Bike   Level 1.5   Minutes 10   METs 2     NuStep   Level 2   SPM 60   Minutes 10   METs 1.5     Track   Laps 11   Minutes 10   METs 2.92      Nutrition:  Target Goals: Understanding of nutrition guidelines, daily intake of sodium 1500mg , cholesterol 200mg , calories 30% from fat and 7% or less from saturated fats, daily to have 5 or more servings of fruits and vegetables.  Biometrics:     Pre Biometrics - 01/11/17 1125      Pre Biometrics   Waist Circumference 37.5 inches   Hip Circumference 44 inches   Waist to Hip Ratio 0.85 %   Triceps Skinfold 26 mm   % Body Fat 40.3 %   Grip Strength 23.5 kg   Flexibility 16 in   Single Leg Stand 30 seconds       Nutrition Therapy Plan and Nutrition Goals:     Nutrition Therapy & Goals - 01/12/17 1118      Nutrition Therapy   Diet Low Sodium      Personal Nutrition Goals   Nutrition Goal Wt loss of 1-2 lb per week to a wt loss goal of 6-18 lb at graduation from [place], educate and counsel regarding individualized specific dietary modifications aiming towards targeted core components such as weight, hypertension, lipid management, diabetes, heart failure and other comorbidities.   Expected Outcomes Short Term Goal: Understand basic principles of dietary content, such as calories, fat, sodium, cholesterol and nutrients.;Long Term Goal: Adherence to prescribed nutrition plan.      Nutrition Discharge: Nutrition Scores:     Nutrition Assessments - 01/12/17 1118      MEDFICTS Scores   Pre Score 0      Nutrition Goals Re-Evaluation:  Nutrition Goals Re-Evaluation:   Nutrition  Goals Discharge (Final Nutrition Goals Re-Evaluation):   Psychosocial: Target Goals: Acknowledge presence or absence of significant depression and/or stress, maximize coping skills, provide positive support system. Participant is able to verbalize types and ability to use techniques and skills needed for reducing stress and depression.  Initial Review & Psychosocial Screening:     Initial Psych Review & Screening - 01/11/17 [place]? Yes     Barriers   Psychosocial barriers to participate in program There are no identifiable barriers or psychosocial needs.     Screening Interventions   Interventions Encouraged to exercise      Quality of Life Scores:     Quality of Life - 01/11/17 0924      Quality of Life Scores   Health/Function Pre 28.88 %   Socioeconomic Pre 28.58 %   Psych/Spiritual Pre 29.17 %   Family Pre 28.5 %   GLOBAL Pre 28.82 %      PHQ-9: Recent Review Flowsheet Data    Depression screen [facility] 2/9 01/17/2017   Decreased Interest 0   Down, Depressed, Hopeless 0   PHQ - 2 Score 0     Interpretation of Total Score  Total Score Depression Severity:  1-4 = Minimal depression, 5-9 = Mild depression, 10-14 = Moderate depression, 15-19 = Moderately severe depression, 20-27 = Severe depression   Psychosocial Evaluation and Intervention:   Psychosocial Re-Evaluation:     Psychosocial Re-Evaluation    [place] Name 02/09/17 1739             Psychosocial Re-Evaluation   Current issues with None Identified       Interventions Encouraged to attend Cardiac Rehabilitation for the exercise       Continue Psychosocial Services  No Follow up required          Psychosocial Discharge (Final Psychosocial Re-Evaluation):     Psychosocial Re-Evaluation - 02/09/17 1739      Psychosocial Re-Evaluation   Current issues with None Identified   Interventions Encouraged to attend Cardiac Rehabilitation for the exercise   Continue  Psychosocial Services  No Follow up required      Vocational Rehabilitation: Provide vocational rehab assistance to qualifying candidates.   Vocational Rehab Evaluation & Intervention:     Vocational Rehab - 01/11/17 1650      Initial Vocational Rehab Evaluation & Intervention   Assessment shows need for Vocational Rehabilitation No      Education: Education Goals: Education classes will be provided on a weekly basis, covering required topics. Participant will state understanding/return demonstration of topics presented.  Learning Barriers/Preferences:     Learning Barriers/Preferences - 01/11/17 0848      Learning Barriers/Preferences   Learning Barriers Sight;Hearing   Learning Preferences Written Material      Education Topics: Count Your Pulse:  -Group instruction provided by verbal instruction, demonstration, patient participation and written materials to support subject.  Instructors address importance of being able to find your pulse and how to count your pulse when at home without a heart monitor.  Patients get hands on experience counting their pulse with staff help and individually.   Heart Attack, Angina, and Risk Factor Modification:  -Group instruction provided by verbal instruction, video, and written materials to support subject.  Instructors address signs and symptoms of angina and heart attacks.    Also discuss risk factors for heart disease and how to make changes to  improve heart health risk factors.   Functional Fitness:  -Group instruction provided by verbal instruction, demonstration, patient participation, and written materials to support subject.  Instructors address safety measures for doing things around the house.  Discuss how to get up and down off the floor, how to pick things up properly, how to safely get out of a chair without assistance, and balance training.   Meditation and Mindfulness:  -Group instruction provided by verbal instruction,  patient participation, and written materials to support subject.  Instructor addresses importance of mindfulness and meditation practice to help reduce stress and improve awareness.  Instructor also leads participants through a meditation exercise.    Stretching for Flexibility and Mobility:  -Group instruction provided by verbal instruction, patient participation, and written materials to support subject.  Instructors lead participants through series of stretches that are designed to increase flexibility thus improving mobility.  These stretches are additional exercise for major muscle groups that are typically performed during regular warm up and cool down.   Hands Only CPR Anytime:  -Group instruction provided by verbal instruction, video, patient participation and written materials to support subject.  Instructors co-teach with AHA video for hands only CPR.  Participants get hands on experience with mannequins.   Nutrition I class: Heart Healthy Eating:  -Group instruction provided by PowerPoint slides, verbal discussion, and written materials to support subject matter. The instructor gives an explanation and review of the Therapeutic Lifestyle Changes diet recommendations, which includes a discussion on lipid goals, dietary fat, sodium, fiber, plant stanol/sterol esters, sugar, and the components of a well-balanced, healthy diet.   Nutrition II class: Lifestyle Skills:  -Group instruction provided by PowerPoint slides, verbal discussion, and written materials to support subject matter. The instructor gives an explanation and review of label reading, grocery shopping for heart health, heart healthy recipe modifications, and ways to make healthier choices when eating out.   Diabetes Question & Answer:  -Group instruction provided by PowerPoint slides, verbal discussion, and written materials to support subject matter. The instructor gives an explanation and review of diabetes co-morbidities,  pre- and post-prandial blood glucose goals, pre-exercise blood glucose goals, signs, symptoms, and treatment of hypoglycemia and hyperglycemia, and foot care basics.   Diabetes Blitz:  -Group instruction provided by PowerPoint slides, verbal discussion, and written materials to support subject matter. The instructor gives an explanation and review of the physiology behind type 1 and type 2 diabetes, diabetes medications and rational behind using different medications, pre- and post-prandial blood glucose recommendations and Hemoglobin A1c goals, diabetes diet, and exercise including blood glucose guidelines for exercising safely.    Portion Distortion:  -Group instruction provided by PowerPoint slides, verbal discussion, written materials, and food models to support subject matter. The instructor gives an explanation of serving size versus portion size, changes in portions sizes over the last 20 years, and what consists of a serving from each food group.   Stress Management:  -Group instruction provided by verbal instruction, video, and written materials to support subject matter.  Instructors review role of stress in heart disease and how to cope with stress positively.     Exercising on Your Own:  -Group instruction provided by verbal instruction, power point, and written materials to support subject.  Instructors discuss benefits of exercise, components of exercise, frequency and intensity of exercise, and end points for exercise.  Also discuss use of nitroglycerin and activating EMS.  Review options of places to exercise outside of rehab.  Review guidelines for sex  with heart disease.   Cardiac Drugs I:  -Group instruction provided by verbal instruction and written materials to support subject.  Instructor reviews cardiac drug classes: antiplatelets, anticoagulants, beta blockers, and statins.  Instructor discusses reasons, side effects, and lifestyle considerations for each drug  class.   Cardiac Drugs II:  -Group instruction provided by verbal instruction and written materials to support subject.  Instructor reviews cardiac drug classes: angiotensin converting enzyme inhibitors (ACE-I), angiotensin II receptor blockers (ARBs), nitrates, and calcium channel blockers.  Instructor discusses reasons, side effects, and lifestyle considerations for each drug class.   Anatomy and Physiology of the Circulatory System:  -Group instruction provided by verbal instruction, video, and written materials to support subject.  Reviews functional anatomy of heart, how it relates to various diagnoses, and what role the heart plays in the overall system.   CARDIAC REHAB PHASE II EXERCISE from 01/19/2017 in [place]  Date  01/19/17  Instruction Review Code  2- meets goals/outcomes      Knowledge Questionnaire Score:     Knowledge Questionnaire Score - 01/11/17 0924      Knowledge Questionnaire Score   Pre Score 16/24      Core Components/Risk Factors/Patient Goals at Admission:     Personal Goals and Risk Factors at Admission - 01/11/17 1127      Core Components/Risk Factors/Patient Goals on Admission    Weight Management Yes;Weight Maintenance;Weight Loss   Intervention Weight Management: Develop a combined nutrition and exercise program designed to reach desired caloric intake, while maintaining appropriate intake of nutrient and fiber, sodium and fats, and appropriate energy expenditure required for the weight goal.;Weight Management: Provide education and appropriate resources to help participant work on and attain dietary goals.;Weight Management/Obesity: Establish reasonable short term and long term weight goals.;Obesity: Provide education and appropriate resources to help participant work on and attain dietary goals.   Admit Weight 182 lb (82.6 kg)   Goal Weight: Short Term 176 lb (79.8 kg)   Goal Weight: Long Term 164 lb (74.4 kg)    Expected Outcomes Short Term: Continue to assess and modify interventions until short term weight is achieved;Long Term: Adherence to nutrition and physical activity/exercise program aimed toward attainment of established weight goal;Weight Maintenance: Understanding of the daily nutrition guidelines, which includes 25-35% calories from fat, 7% or less cal from saturated fats, less than 200mg  cholesterol, less than 1.5gm of sodium, & 5 or more servings of fruits and vegetables daily;Weight Loss: Understanding of general recommendations for a balanced deficit meal plan, which promotes 1-2 lb weight loss per week and includes a negative energy balance of [phone] kcal/d;Understanding recommendations for meals to include 15-35% energy as protein, 25-35% energy from fat, 35-60% energy from carbohydrates, less than 200mg  of dietary cholesterol, 20-35 gm of total fiber daily;Understanding of distribution of calorie intake throughout the day with the consumption of 4-5 meals/snacks   Improve shortness of breath with ADL's Yes   Intervention Provide education, individualized exercise plan and daily activity instruction to help decrease symptoms of SOB with activities of daily living.   Expected Outcomes Short Term: Achieves a reduction of symptoms when performing activities of daily living.   Hypertension Yes   Intervention Provide education on lifestyle modifcations including regular physical activity/exercise, weight management, moderate sodium restriction and increased consumption of fresh fruit, vegetables, and low fat dairy, alcohol moderation, and smoking cessation.;Monitor prescription use compliance.   Expected Outcomes Short Term: Continued assessment and intervention until BP is < 140/58mm  HG in hypertensive participants. < 130/65mm HG in hypertensive participants with diabetes, heart failure or chronic kidney disease.;Long Term: Maintenance of blood pressure at goal levels.   Lipids Yes   Intervention  Provide education and support for participant on nutrition & aerobic/resistive exercise along with prescribed medications to achieve LDL 70mg , HDL >40mg .   Expected Outcomes Short Term: Participant states understanding of desired cholesterol values and is compliant with medications prescribed. Participant is following exercise prescription and nutrition guidelines.;Long Term: Cholesterol controlled with medications as prescribed, with individualized exercise RX and with personalized nutrition plan. Value goals: LDL < 70mg , HDL > 40 mg.   Personal Goal Other Yes   Personal Goal Be able to walk through airport in 2 years in order to travel to [place] without symptoms of SOB   Intervention Provide exercise programming to assist with improving cardiorespiratory fitness   Expected Outcomes Pt will increase walking tolerance with improved symptoms of SOB      Core Components/Risk Factors/Patient Goals Review:    Core Components/Risk Factors/Patient Goals at Discharge (Final Review):    ITP Comments:     ITP Comments    Row Name 01/11/17 0814           ITP Comments Mecical Director- Dr. Fransico Him, MD          Comments: Judeth Porch is making expected progress toward personal goals after completing 8 sessions. Recommend continued exercise and life style modification education including  stress management and relaxation techniques to decrease cardiac risk profile. Barnet Pall, RN,BSN 02/09/2017 5:41 PM

## 2017-02-11 ENCOUNTER — Encounter (HOSPITAL_COMMUNITY): Payer: Medicare Other

## 2017-02-11 ENCOUNTER — Telehealth: Payer: Self-pay | Admitting: Cardiology

## 2017-02-11 ENCOUNTER — Encounter (HOSPITAL_COMMUNITY): Admission: RE | Admit: 2017-02-11 | Payer: Medicare Other | Source: Ambulatory Visit

## 2017-02-11 ENCOUNTER — Telehealth (HOSPITAL_COMMUNITY): Payer: Self-pay | Admitting: *Deleted

## 2017-02-11 NOTE — Telephone Encounter (Signed)
Received call from patient-patient reports she had a dizzy spell this AM when standing that lasted 15-20 seconds and resolved.  Denies dizziness at rest, SOB, CP, lightheadedness.   Reports she spoke to someone at [facility] this AM and was advised she may be dehydrated as she does not drink water-on lasix 20mg  daily (per pt report).  BP 120/66 HR 55, reports asymptomatic at this time.     Advised to increase water intake and continue to monitor.  Advised to return call if no improvement. Also, advised I would route to Dr. Radford Pax for further recommendations.  Patient aware, agreed, and verbalized understanding.

## 2017-02-11 NOTE — Telephone Encounter (Signed)
Pt called to rehab and asked whether she should exercise today.  Pt felt dizzy for 20-25 seconds when she stood up this morning.  Pt reports feeling this off and on previously  throughout the week.  Pt advised not to exercise today. Asked pt had she checked her BP.  Pt stated that she had not.  Asked pt to check her bp while on the phone with rehab staff.  Pt declined. Asked pt had she eaten today.  Pt indicated she had.  Asked pt what she had to drink.  Pt stated she did not like water. Encouraged pt to increase fluid intake. Advised pt to contact Dr. Radford Pax to see if any medication changes were warranted. Pt verbalized understanding and is in agreement of this. Cherre Huger, BSN Cardiac and Training and development officer

## 2017-02-11 NOTE — Telephone Encounter (Signed)
New Message    Pt went to rehab today and she has been having dizziness every now and again  Rehab thinks she may be dehydrated , because of fluid pill and not drinking enough water

## 2017-02-14 ENCOUNTER — Encounter (HOSPITAL_COMMUNITY)
Admission: RE | Admit: 2017-02-14 | Discharge: 2017-02-14 | Disposition: A | Discharge status: Home / Self Care | Payer: Medicare Other | Source: Ambulatory Visit | Attending: Cardiovascular Disease | Admitting: Cardiovascular Disease

## 2017-02-14 ENCOUNTER — Encounter (HOSPITAL_COMMUNITY): Payer: Medicare Other

## 2017-02-14 DIAGNOSIS — Z87891 Personal history of nicotine dependence: Secondary | ICD-10-CM | POA: Diagnosis not present

## 2017-02-14 DIAGNOSIS — I509 Heart failure, unspecified: Secondary | ICD-10-CM | POA: Diagnosis not present

## 2017-02-14 DIAGNOSIS — Z952 Presence of prosthetic heart valve: Secondary | ICD-10-CM

## 2017-02-16 ENCOUNTER — Encounter (HOSPITAL_COMMUNITY): Payer: Medicare Other

## 2017-02-16 ENCOUNTER — Encounter (HOSPITAL_COMMUNITY)
Admission: RE | Admit: 2017-02-16 | Discharge: 2017-02-16 | Disposition: A | Discharge status: Home / Self Care | Payer: Medicare Other | Source: Ambulatory Visit | Attending: Cardiovascular Disease | Admitting: Cardiovascular Disease

## 2017-02-16 DIAGNOSIS — Z952 Presence of prosthetic heart valve: Secondary | ICD-10-CM

## 2017-02-16 DIAGNOSIS — I509 Heart failure, unspecified: Secondary | ICD-10-CM | POA: Insufficient documentation

## 2017-02-16 DIAGNOSIS — Z87891 Personal history of nicotine dependence: Secondary | ICD-10-CM | POA: Diagnosis not present

## 2017-02-18 ENCOUNTER — Encounter (HOSPITAL_COMMUNITY): Payer: Medicare Other

## 2017-02-18 ENCOUNTER — Encounter (HOSPITAL_COMMUNITY)
Admission: RE | Admit: 2017-02-18 | Discharge: 2017-02-18 | Disposition: A | Discharge status: Home / Self Care | Payer: Medicare Other | Source: Ambulatory Visit | Attending: Cardiovascular Disease | Admitting: Cardiovascular Disease

## 2017-02-18 DIAGNOSIS — Z87891 Personal history of nicotine dependence: Secondary | ICD-10-CM | POA: Diagnosis not present

## 2017-02-18 DIAGNOSIS — Z952 Presence of prosthetic heart valve: Secondary | ICD-10-CM

## 2017-02-18 DIAGNOSIS — I509 Heart failure, unspecified: Secondary | ICD-10-CM | POA: Diagnosis not present

## 2017-02-21 ENCOUNTER — Encounter (HOSPITAL_COMMUNITY)
Admission: RE | Admit: 2017-02-21 | Discharge: 2017-02-21 | Disposition: A | Discharge status: Home / Self Care | Payer: Medicare Other | Source: Ambulatory Visit | Attending: Cardiovascular Disease | Admitting: Cardiovascular Disease

## 2017-02-21 ENCOUNTER — Encounter (HOSPITAL_COMMUNITY): Payer: Medicare Other

## 2017-02-21 DIAGNOSIS — I509 Heart failure, unspecified: Secondary | ICD-10-CM | POA: Diagnosis not present

## 2017-02-21 DIAGNOSIS — Z87891 Personal history of nicotine dependence: Secondary | ICD-10-CM | POA: Diagnosis not present

## 2017-02-21 DIAGNOSIS — Z952 Presence of prosthetic heart valve: Secondary | ICD-10-CM

## 2017-02-22 DIAGNOSIS — H25811 Combined forms of age-related cataract, right eye: Secondary | ICD-10-CM | POA: Diagnosis not present

## 2017-02-22 DIAGNOSIS — H25812 Combined forms of age-related cataract, left eye: Secondary | ICD-10-CM | POA: Diagnosis not present

## 2017-02-22 DIAGNOSIS — H40003 Preglaucoma, unspecified, bilateral: Secondary | ICD-10-CM | POA: Diagnosis not present

## 2017-02-23 ENCOUNTER — Encounter (HOSPITAL_COMMUNITY)
Admission: RE | Admit: 2017-02-23 | Discharge: 2017-02-23 | Disposition: A | Discharge status: Home / Self Care | Payer: Medicare Other | Source: Ambulatory Visit | Attending: Cardiovascular Disease | Admitting: Cardiovascular Disease

## 2017-02-23 ENCOUNTER — Encounter (HOSPITAL_COMMUNITY): Payer: Medicare Other

## 2017-02-23 DIAGNOSIS — Z87891 Personal history of nicotine dependence: Secondary | ICD-10-CM | POA: Diagnosis not present

## 2017-02-23 DIAGNOSIS — Z952 Presence of prosthetic heart valve: Secondary | ICD-10-CM

## 2017-02-23 DIAGNOSIS — I509 Heart failure, unspecified: Secondary | ICD-10-CM | POA: Diagnosis not present

## 2017-02-25 ENCOUNTER — Encounter (HOSPITAL_COMMUNITY)
Admission: RE | Admit: 2017-02-25 | Discharge: 2017-02-25 | Disposition: A | Discharge status: Home / Self Care | Payer: Medicare Other | Source: Ambulatory Visit | Attending: Cardiovascular Disease | Admitting: Cardiovascular Disease

## 2017-02-25 ENCOUNTER — Encounter (HOSPITAL_COMMUNITY): Payer: Medicare Other

## 2017-02-25 DIAGNOSIS — Z87891 Personal history of nicotine dependence: Secondary | ICD-10-CM | POA: Diagnosis not present

## 2017-02-25 DIAGNOSIS — Z952 Presence of prosthetic heart valve: Secondary | ICD-10-CM

## 2017-02-25 DIAGNOSIS — I509 Heart failure, unspecified: Secondary | ICD-10-CM | POA: Diagnosis not present

## 2017-02-28 ENCOUNTER — Encounter (HOSPITAL_COMMUNITY): Payer: Medicare Other

## 2017-02-28 ENCOUNTER — Encounter (HOSPITAL_COMMUNITY)
Admission: RE | Admit: 2017-02-28 | Discharge: 2017-02-28 | Disposition: A | Discharge status: Home / Self Care | Payer: Medicare Other | Source: Ambulatory Visit | Attending: Cardiovascular Disease | Admitting: Cardiovascular Disease

## 2017-02-28 DIAGNOSIS — I509 Heart failure, unspecified: Secondary | ICD-10-CM | POA: Diagnosis not present

## 2017-02-28 DIAGNOSIS — Z87891 Personal history of nicotine dependence: Secondary | ICD-10-CM | POA: Diagnosis not present

## 2017-02-28 DIAGNOSIS — Z952 Presence of prosthetic heart valve: Secondary | ICD-10-CM

## 2017-03-02 ENCOUNTER — Encounter (HOSPITAL_COMMUNITY): Payer: Medicare Other

## 2017-03-02 ENCOUNTER — Encounter (HOSPITAL_COMMUNITY)
Admission: RE | Admit: 2017-03-02 | Discharge: 2017-03-02 | Disposition: A | Discharge status: Home / Self Care | Payer: Medicare Other | Source: Ambulatory Visit | Attending: Cardiovascular Disease | Admitting: Cardiovascular Disease

## 2017-03-02 DIAGNOSIS — I509 Heart failure, unspecified: Secondary | ICD-10-CM | POA: Diagnosis not present

## 2017-03-02 DIAGNOSIS — Z87891 Personal history of nicotine dependence: Secondary | ICD-10-CM | POA: Diagnosis not present

## 2017-03-02 DIAGNOSIS — Z952 Presence of prosthetic heart valve: Secondary | ICD-10-CM

## 2017-03-03 DIAGNOSIS — H401231 Low-tension glaucoma, bilateral, mild stage: Secondary | ICD-10-CM | POA: Diagnosis not present

## 2017-03-03 DIAGNOSIS — H25813 Combined forms of age-related cataract, bilateral: Secondary | ICD-10-CM | POA: Diagnosis not present

## 2017-03-04 ENCOUNTER — Encounter (HOSPITAL_COMMUNITY): Payer: Medicare Other

## 2017-03-04 ENCOUNTER — Encounter (HOSPITAL_COMMUNITY)
Admission: RE | Admit: 2017-03-04 | Discharge: 2017-03-04 | Disposition: A | Discharge status: Home / Self Care | Payer: Medicare Other | Source: Ambulatory Visit | Attending: Cardiovascular Disease | Admitting: Cardiovascular Disease

## 2017-03-04 DIAGNOSIS — Z87891 Personal history of nicotine dependence: Secondary | ICD-10-CM | POA: Diagnosis not present

## 2017-03-04 DIAGNOSIS — I509 Heart failure, unspecified: Secondary | ICD-10-CM | POA: Diagnosis not present

## 2017-03-04 DIAGNOSIS — Z952 Presence of prosthetic heart valve: Secondary | ICD-10-CM

## 2017-03-07 ENCOUNTER — Encounter (HOSPITAL_COMMUNITY)
Admission: RE | Admit: 2017-03-07 | Discharge: 2017-03-07 | Disposition: A | Discharge status: Home / Self Care | Payer: Medicare Other | Source: Ambulatory Visit | Attending: Cardiovascular Disease | Admitting: Cardiovascular Disease

## 2017-03-07 ENCOUNTER — Encounter (HOSPITAL_COMMUNITY): Payer: Medicare Other

## 2017-03-07 DIAGNOSIS — Z87891 Personal history of nicotine dependence: Secondary | ICD-10-CM | POA: Diagnosis not present

## 2017-03-07 DIAGNOSIS — I509 Heart failure, unspecified: Secondary | ICD-10-CM | POA: Diagnosis not present

## 2017-03-07 DIAGNOSIS — Z952 Presence of prosthetic heart valve: Secondary | ICD-10-CM

## 2017-03-07 NOTE — Progress Notes (Signed)
Cardiac Individual Treatment Plan  Patient Details  Name: [name] MRN: [id number] Date of Birth: [birth year] Referring Provider:     CARDIAC REHAB PHASE II ORIENTATION from 01/11/2017 in [place]  Referring Provider  Darlina Guys MD and Fransico Him MD      Initial Encounter Date:    CARDIAC REHAB PHASE II ORIENTATION from 01/11/2017 in [place]  Date  01/11/17  Referring Provider  Darlina Guys MD and Fransico Him MD      Visit Diagnosis: S/P TAVR (transcatheter aortic valve replacement)  Patient's Home Medications on Admission:  Current Outpatient Prescriptions:  .  acetaminophen (TYLENOL) 325 MG tablet, Take 650 mg by mouth 2 (two) times daily., Disp: , Rfl:  .  amoxicillin (AMOXIL) 500 MG capsule, TAKE 4 CAPSULES BY MOUTH 1 HOUR BEFORE DENTAL APPT FOR PREMED, Disp: , Rfl: 0 .  atorvastatin (LIPITOR) 20 MG tablet, Take 20 mg by mouth daily., Disp: , Rfl:  .  Calcium Carbonate-Vitamin D (CALCIUM-VITAMIN D) 500-200 MG-UNIT tablet, Take 1 tablet by mouth daily., Disp: , Rfl:  .  diltiazem (TIAZAC) 360 MG 24 hr capsule, Take 360 mg by mouth every evening., Disp: , Rfl:  .  furosemide (LASIX) 20 MG tablet, Take 20 mg by mouth as needed., Disp: , Rfl:  .  glucosamine-chondroitin 500-400 MG tablet, Take 1 tablet by mouth 2 (two) times daily., Disp: , Rfl:  .  losartan (COZAAR) 50 MG tablet, Take 50 mg by mouth daily., Disp: , Rfl:  .  Melatonin 10 MG TABS, Take 1 tablet by mouth at bedtime. , Disp: , Rfl:  .  nitroGLYCERIN (NITROSTAT) 0.4 MG SL tablet, Place 0.4 mg under the tongue every 5 (five) minutes as needed for chest pain. For chest pain , Disp: , Rfl:  .  omeprazole (PRILOSEC) 20 MG capsule, Take 20 mg by mouth daily. , Disp: , Rfl:  .  polycarbophil (FIBER-CAPS) 625 MG tablet, Take 625 mg by mouth daily., Disp: , Rfl:  .  predniSONE (DELTASONE) 5 MG tablet, Take 5 mg by mouth daily with  breakfast., Disp: , Rfl:  .  rivaroxaban (XARELTO) 20 MG TABS tablet, Take 20 mg by mouth daily with supper., Disp: , Rfl:   Past Medical History: Past Medical History:  Diagnosis Date  . Anginal pain ([place])   . Aortic stenosis    severe AS with moderate AR by echo with normal LVF  . Arthritis   . CAD in native artery, non obstructive cath 11/2016.  12/16/2016  . Cancer [facility])    left breast mastectomy(cancer)-surgery only  . Chronic diastolic congestive heart failure ([place])   . Complication of anesthesia    extremely claustophobic(only occurs with narcotic medications as stated per pt)  . Diverticulosis   . Fibromyalgia   . GERD (gastroesophageal reflux disease)   . Hard of hearing    wears hearing aides   . History of breast cancer no recurrence   dx 2004   s/p  left breast mastectomy w/ node dissection/   no chemo or radiation  . History of TIA (transient ischemic attack)    per scan--  no residual  . Hyperlipidemia   . Hypertension   . Nocturia   . OSA on CPAP     severe/  AHI 34/hr  . Permanent atrial fibrillation (HCC)    on Xarelto for CHADS2VASC score of 5  . Pulmonary HTN    PASP 49mmHg by  echo 11/2016  . Stroke ([place])    tia  . Urge urinary incontinence   . Wears glasses     Tobacco Use: History  Smoking Status  . Former Smoker  . Packs/day: 0.25  . Years: 8.00  . Types: Cigarettes  . Quit date: 10/18/1966  Smokeless Tobacco  . Never Used    Labs: Recent Review Flowsheet Data    Labs for ITP Cardiac and Pulmonary Rehab Latest Ref Rng & Units 12/10/2016 12/14/2016 12/14/2016 12/14/2016 12/14/2016   Hemoglobin A1c 4.8 - 5.6 % 5.6 - - - -   PHART 7.350 - 7.450 7.457(H) - - - 7.424   PCO2ART 32.0 - 48.0 mmHg 34.0 - - - 39.2   HCO3 20.0 - 28.0 mmol/L 23.6 - - - 25.8   TCO2 0 - 100 mmol/L - 24 24 26 27    O2SAT % 97.4 - - - 99.0      Capillary Blood Glucose: Lab Results  Component Value Date   GLUCAP 85 12/15/2016   GLUCAP 102 (H) 11/13/2011   GLUCAP 123 (H)  11/13/2011     Exercise Target Goals:    Exercise Program Goal: Individual exercise prescription set with THRR, safety & activity barriers. Participant demonstrates ability to understand and report RPE using BORG scale, to self-measure pulse accurately, and to acknowledge the importance of the exercise prescription.  Exercise Prescription Goal: Starting with aerobic activity 30 plus minutes a day, 3 days per week for initial exercise prescription. Provide home exercise prescription and guidelines that participant acknowledges understanding prior to discharge.  Activity Barriers & Risk Stratification:     Activity Barriers & Cardiac Risk Stratification - 01/11/17 1114      Activity Barriers & Cardiac Risk Stratification   Activity Barriers Fibromyalgia;Joint Problems;Deconditioning;Muscular Weakness;Other (comment);Left Knee Replacement;Left Hip Replacement   Comments L mastectomy, R shoulder arthroplasty      6 Minute Walk:     6 Minute Walk    Row Name 01/11/17 1113 01/11/17 1125       6 Minute Walk   Phase Initial  -    Distance 1489 feet  -    Walk Time 6 minutes  -    # of Rest Breaks 0  -    MPH  - 2.82    METS  - 1.99    RPE 11  -    Perceived Dyspnea  1  -    Symptoms Yes (comment)  -    Comments mild SOB  -    Resting HR 70 bpm  -    Resting BP 124/57  -    Max Ex. HR 98 bpm  -    Max Ex. BP 130/70  -    2 Minute Post BP 110/60  -       Oxygen Initial Assessment:   Oxygen Re-Evaluation:   Oxygen Discharge (Final Oxygen Re-Evaluation):   Initial Exercise Prescription:     Initial Exercise Prescription - 01/11/17 1100      Date of Initial Exercise RX and Referring Provider   Date 01/11/17   Referring Provider Darlina Guys MD and Fransico Him MD     Recumbant Bike   Level 1.5   Minutes 10   METs 1.3     NuStep   Level 2   SPM 60   Minutes 10   METs 1.5     Track   Laps 9   Minutes 10   METs 2.57     Prescription  Details    Frequency (times per week) 3   Duration Progress to 30 minutes of continuous aerobic without signs/symptoms of physical distress     Intensity   THRR 40-80% of Max Heartrate 56-113   Ratings of Perceived Exertion 11-13   Perceived Dyspnea 0-4     Progression   Progression Continue to progress workloads to maintain intensity without signs/symptoms of physical distress.     Resistance Training   Training Prescription Yes   Weight 1lb   Reps 10-15      Perform Capillary Blood Glucose checks as needed.  Exercise Prescription Changes:      Exercise Prescription Changes    Row Name 02/02/17 1700 02/23/17 1100           Response to Exercise   Blood Pressure (Admit) 100/60 124/68      Blood Pressure (Exercise) 144/60 138/64      Blood Pressure (Exit) 114/66 104/64      Heart Rate (Admit) 56 bpm 62 bpm      Heart Rate (Exercise) 98 bpm 95 bpm      Heart Rate (Exit) 56 bpm 63 bpm      Rating of Perceived Exertion (Exercise) 11 10      Duration Progress to 45 minutes of aerobic exercise without signs/symptoms of physical distress Progress to 45 minutes of aerobic exercise without signs/symptoms of physical distress      Intensity THRR unchanged THRR unchanged        Progression   Progression Continue to progress workloads to maintain intensity without signs/symptoms of physical distress. Continue to progress workloads to maintain intensity without signs/symptoms of physical distress.      Average METs 2.5 3.2        Resistance Training   Training Prescription Yes Yes      Weight 3lb 3lb      Reps 10-15 10-15        Recumbant Bike   Level 1.5 1.5      Minutes 10 10      METs 2 3.5        NuStep   Level 2 2      SPM 60 60      Minutes 10 10      METs 1.5 3.6        Track   Laps 11 9      Minutes 10 10      METs 2.92 2.6        Home Exercise Plan   Plans to continue exercise at  - Home (comment)      Frequency  - Add 3 additional days to program exercise  sessions.      Initial Home Exercises Provided  - 02/07/17         Exercise Comments:      Exercise Comments    Row Name 02/04/17 1603 02/07/17 1035 03/02/17 1646       Exercise Comments pt was doing well with exercise but she has been absent for the past week due to having skin cancer removed from her face.  She plans to return next week.  Reviewed home exercise guidelines with patient. She has a stationary bike at home, which she rides 10 minutes twice/day, and she walks 20 minutes daily. Reviewed METs and goals with pt.  Pt is doing well with exercise        Exercise Goals and Review:      Exercise Goals    Row Name 01/11/17  1127 01/11/17 1136           Exercise Goals   Increase Physical Activity Yes  -      Intervention Provide advice, education, support and counseling about physical activity/exercise needs.;Develop an individualized exercise prescription for aerobic and resistive training based on initial evaluation findings, risk stratification, comorbidities and participant's personal goals.  -      Expected Outcomes Achievement of increased cardiorespiratory fitness and enhanced flexibility, muscular endurance and strength shown through measurements of functional capacity and personal statement of participant.  -      Increase Strength and Stamina Yes -  increase walking tolerance without symptoms of SOB. Long term be able to walk in airport in order to travel to [place] in 2 years without SOB      Intervention Provide advice, education, support and counseling about physical activity/exercise needs.;Develop an individualized exercise prescription for aerobic and resistive training based on initial evaluation findings, risk stratification, comorbidities and participant's personal goals.  -      Expected Outcomes Achievement of increased cardiorespiratory fitness and enhanced flexibility, muscular endurance and strength shown through measurements of functional capacity and  personal statement of participant.  -         Exercise Goals Re-Evaluation :     Exercise Goals Re-Evaluation    Row Name 03/02/17 1644             Exercise Goal Re-Evaluation   Exercise Goals Review Increase Physical Activity;Increase Strenth and Stamina       Comments Pt states that she feels great and she is able to play golf every Saturday.       Expected Outcomes Continue with exercise Rx  and increase workloads as tolerated in order to continue to increase overall cardiorepsiratory fitness level.           Discharge Exercise Prescription (Final Exercise Prescription Changes):     Exercise Prescription Changes - 02/23/17 1100      Response to Exercise   Blood Pressure (Admit) 124/68   Blood Pressure (Exercise) 138/64   Blood Pressure (Exit) 104/64   Heart Rate (Admit) 62 bpm   Heart Rate (Exercise) 95 bpm   Heart Rate (Exit) 63 bpm   Rating of Perceived Exertion (Exercise) 10   Duration Progress to 45 minutes of aerobic exercise without signs/symptoms of physical distress   Intensity THRR unchanged     Progression   Progression Continue to progress workloads to maintain intensity without signs/symptoms of physical distress.   Average METs 3.2     Resistance Training   Training Prescription Yes   Weight 3lb   Reps 10-15     Recumbant Bike   Level 1.5   Minutes 10   METs 3.5     NuStep   Level 2   SPM 60   Minutes 10   METs 3.6     Track   Laps 9   Minutes 10   METs 2.6     Home Exercise Plan   Plans to continue exercise at Home (comment)   Frequency Add 3 additional days to program exercise sessions.   Initial Home Exercises Provided 02/07/17      Nutrition:  Target Goals: Understanding of nutrition guidelines, daily intake of sodium 1500mg , cholesterol 200mg , calories 30% from fat and 7% or less from saturated fats, daily to have 5 or more servings of fruits and vegetables.  Biometrics:     Pre Biometrics - 01/11/17 1125  Pre  Biometrics   Waist Circumference 37.5 inches   Hip Circumference 44 inches   Waist to Hip Ratio 0.85 %   Triceps Skinfold 26 mm   % Body Fat 40.3 %   Grip Strength 23.5 kg   Flexibility 16 in   Single Leg Stand 30 seconds       Nutrition Therapy Plan and Nutrition Goals:     Nutrition Therapy & Goals - 01/12/17 1118      Nutrition Therapy   Diet Low Sodium      Personal Nutrition Goals   Nutrition Goal Wt loss of 1-2 lb per week to a wt loss goal of 6-18 lb at graduation from [place], educate and counsel regarding individualized specific dietary modifications aiming towards targeted core components such as weight, hypertension, lipid management, diabetes, heart failure and other comorbidities.   Expected Outcomes Short Term Goal: Understand basic principles of dietary content, such as calories, fat, sodium, cholesterol and nutrients.;Long Term Goal: Adherence to prescribed nutrition plan.      Nutrition Discharge: Nutrition Scores:     Nutrition Assessments - 01/12/17 1118      MEDFICTS Scores   Pre Score 0      Nutrition Goals Re-Evaluation:   Nutrition Goals Re-Evaluation:   Nutrition Goals Discharge (Final Nutrition Goals Re-Evaluation):   Psychosocial: Target Goals: Acknowledge presence or absence of significant depression and/or stress, maximize coping skills, provide positive support system. Participant is able to verbalize types and ability to use techniques and skills needed for reducing stress and depression.  Initial Review & Psychosocial Screening:     Initial Psych Review & Screening - 01/11/17 [place]? Yes     Barriers   Psychosocial barriers to participate in program There are no identifiable barriers or psychosocial needs.     Screening Interventions   Interventions Encouraged to exercise      Quality of Life Scores:     Quality of Life -  01/11/17 0924      Quality of Life Scores   Health/Function Pre 28.88 %   Socioeconomic Pre 28.58 %   Psych/Spiritual Pre 29.17 %   Family Pre 28.5 %   GLOBAL Pre 28.82 %      PHQ-9: Recent Review Flowsheet Data    Depression screen [facility] 2/9 01/17/2017   Decreased Interest 0   Down, Depressed, Hopeless 0   PHQ - 2 Score 0     Interpretation of Total Score  Total Score Depression Severity:  1-4 = Minimal depression, 5-9 = Mild depression, 10-14 = Moderate depression, 15-19 = Moderately severe depression, 20-27 = Severe depression   Psychosocial Evaluation and Intervention:   Psychosocial Re-Evaluation:     Psychosocial Re-Evaluation    [place] Name 02/09/17 1739 03/07/17 1727           Psychosocial Re-Evaluation   Current issues with None Identified None Identified      Interventions Encouraged to attend Cardiac Rehabilitation for the exercise Encouraged to attend Cardiac Rehabilitation for the exercise      Continue Psychosocial Services  No Follow up required No Follow up required         Psychosocial Discharge (Final Psychosocial Re-Evaluation):     Psychosocial Re-Evaluation - 03/07/17 1727      Psychosocial Re-Evaluation   Current issues with None Identified   Interventions Encouraged to attend Cardiac Rehabilitation for  the exercise   Continue Psychosocial Services  No Follow up required      Vocational Rehabilitation: Provide vocational rehab assistance to qualifying candidates.   Vocational Rehab Evaluation & Intervention:     Vocational Rehab - 01/11/17 1650      Initial Vocational Rehab Evaluation & Intervention   Assessment shows need for Vocational Rehabilitation No      Education: Education Goals: Education classes will be provided on a weekly basis, covering required topics. Participant will state understanding/return demonstration of topics presented.  Learning Barriers/Preferences:     Learning Barriers/Preferences - 01/11/17 0848       Learning Barriers/Preferences   Learning Barriers Sight;Hearing   Learning Preferences Written Material      Education Topics: Count Your Pulse:  -Group instruction provided by verbal instruction, demonstration, patient participation and written materials to support subject.  Instructors address importance of being able to find your pulse and how to count your pulse when at home without a heart monitor.  Patients get hands on experience counting their pulse with staff help and individually.   Heart Attack, Angina, and Risk Factor Modification:  -Group instruction provided by verbal instruction, video, and written materials to support subject.  Instructors address signs and symptoms of angina and heart attacks.    Also discuss risk factors for heart disease and how to make changes to improve heart health risk factors.   Functional Fitness:  -Group instruction provided by verbal instruction, demonstration, patient participation, and written materials to support subject.  Instructors address safety measures for doing things around the house.  Discuss how to get up and down off the floor, how to pick things up properly, how to safely get out of a chair without assistance, and balance training.   Meditation and Mindfulness:  -Group instruction provided by verbal instruction, patient participation, and written materials to support subject.  Instructor addresses importance of mindfulness and meditation practice to help reduce stress and improve awareness.  Instructor also leads participants through a meditation exercise.    Stretching for Flexibility and Mobility:  -Group instruction provided by verbal instruction, patient participation, and written materials to support subject.  Instructors lead participants through series of stretches that are designed to increase flexibility thus improving mobility.  These stretches are additional exercise for major muscle groups that are typically performed  during regular warm up and cool down.   Hands Only CPR:  -Group verbal, video, and participation provides a basic overview of AHA guidelines for community CPR. Role-play of emergencies allow participants the opportunity to practice calling for help and chest compression technique with discussion of AED use.   Hypertension: -Group verbal and written instruction that provides a basic overview of hypertension including the most recent diagnostic guidelines, risk factor reduction with self-care instructions and medication management.    Nutrition I class: Heart Healthy Eating:  -Group instruction provided by PowerPoint slides, verbal discussion, and written materials to support subject matter. The instructor gives an explanation and review of the Therapeutic Lifestyle Changes diet recommendations, which includes a discussion on lipid goals, dietary fat, sodium, fiber, plant stanol/sterol esters, sugar, and the components of a well-balanced, healthy diet.   Nutrition II class: Lifestyle Skills:  -Group instruction provided by PowerPoint slides, verbal discussion, and written materials to support subject matter. The instructor gives an explanation and review of label reading, grocery shopping for heart health, heart healthy recipe modifications, and ways to make healthier choices when eating out.   Diabetes Question & Answer:  -  Group instruction provided by PowerPoint slides, verbal discussion, and written materials to support subject matter. The instructor gives an explanation and review of diabetes co-morbidities, pre- and post-prandial blood glucose goals, pre-exercise blood glucose goals, signs, symptoms, and treatment of hypoglycemia and hyperglycemia, and foot care basics.   Diabetes Blitz:  -Group instruction provided by PowerPoint slides, verbal discussion, and written materials to support subject matter. The instructor gives an explanation and review of the physiology behind type 1 and  type 2 diabetes, diabetes medications and rational behind using different medications, pre- and post-prandial blood glucose recommendations and Hemoglobin A1c goals, diabetes diet, and exercise including blood glucose guidelines for exercising safely.    Portion Distortion:  -Group instruction provided by PowerPoint slides, verbal discussion, written materials, and food models to support subject matter. The instructor gives an explanation of serving size versus portion size, changes in portions sizes over the last 20 years, and what consists of a serving from each food group.   Stress Management:  -Group instruction provided by verbal instruction, video, and written materials to support subject matter.  Instructors review role of stress in heart disease and how to cope with stress positively.     Exercising on Your Own:  -Group instruction provided by verbal instruction, power point, and written materials to support subject.  Instructors discuss benefits of exercise, components of exercise, frequency and intensity of exercise, and end points for exercise.  Also discuss use of nitroglycerin and activating EMS.  Review options of places to exercise outside of rehab.  Review guidelines for sex with heart disease.   Cardiac Drugs I:  -Group instruction provided by verbal instruction and written materials to support subject.  Instructor reviews cardiac drug classes: antiplatelets, anticoagulants, beta blockers, and statins.  Instructor discusses reasons, side effects, and lifestyle considerations for each drug class.   Cardiac Drugs II:  -Group instruction provided by verbal instruction and written materials to support subject.  Instructor reviews cardiac drug classes: angiotensin converting enzyme inhibitors (ACE-I), angiotensin II receptor blockers (ARBs), nitrates, and calcium channel blockers.  Instructor discusses reasons, side effects, and lifestyle considerations for each drug  class.   Anatomy and Physiology of the Circulatory System:  Group verbal and written instruction and models provide basic cardiac anatomy and physiology, with the coronary electrical and arterial systems. Review of: AMI, Angina, Valve disease, Heart Failure, Peripheral Artery Disease, Cardiac Arrhythmia, Pacemakers, and the ICD.   CARDIAC REHAB PHASE II EXERCISE from 01/19/2017 in [place]  Date  01/19/17  Instruction Review Code  2- meets goals/outcomes      Other Education:  -Group or individual verbal, written, or video instructions that support the educational goals of the cardiac rehab program.   Knowledge Questionnaire Score:     Knowledge Questionnaire Score - 01/11/17 0924      Knowledge Questionnaire Score   Pre Score 16/24      Core Components/Risk Factors/Patient Goals at Admission:     Personal Goals and Risk Factors at Admission - 01/11/17 1127      Core Components/Risk Factors/Patient Goals on Admission    Weight Management Yes;Weight Maintenance;Weight Loss   Intervention Weight Management: Develop a combined nutrition and exercise program designed to reach desired caloric intake, while maintaining appropriate intake of nutrient and fiber, sodium and fats, and appropriate energy expenditure required for the weight goal.;Weight Management: Provide education and appropriate resources to help participant work on and attain dietary goals.;Weight Management/Obesity: Establish reasonable short term and long  term weight goals.;Obesity: Provide education and appropriate resources to help participant work on and attain dietary goals.   Admit Weight 182 lb (82.6 kg)   Goal Weight: Short Term 176 lb (79.8 kg)   Goal Weight: Long Term 164 lb (74.4 kg)   Expected Outcomes Short Term: Continue to assess and modify interventions until short term weight is achieved;Long Term: Adherence to nutrition and physical activity/exercise program aimed toward  attainment of established weight goal;Weight Maintenance: Understanding of the daily nutrition guidelines, which includes 25-35% calories from fat, 7% or less cal from saturated fats, less than 200mg  cholesterol, less than 1.5gm of sodium, & 5 or more servings of fruits and vegetables daily;Weight Loss: Understanding of general recommendations for a balanced deficit meal plan, which promotes 1-2 lb weight loss per week and includes a negative energy balance of [id number] kcal/d;Understanding recommendations for meals to include 15-35% energy as protein, 25-35% energy from fat, 35-60% energy from carbohydrates, less than 200mg  of dietary cholesterol, 20-35 gm of total fiber daily;Understanding of distribution of calorie intake throughout the day with the consumption of 4-5 meals/snacks   Improve shortness of breath with ADL's Yes   Intervention Provide education, individualized exercise plan and daily activity instruction to help decrease symptoms of SOB with activities of daily living.   Expected Outcomes Short Term: Achieves a reduction of symptoms when performing activities of daily living.   Hypertension Yes   Intervention Provide education on lifestyle modifcations including regular physical activity/exercise, weight management, moderate sodium restriction and increased consumption of fresh fruit, vegetables, and low fat dairy, alcohol moderation, and smoking cessation.;Monitor prescription use compliance.   Expected Outcomes Short Term: Continued assessment and intervention until BP is < 140/81mm HG in hypertensive participants. < 130/68mm HG in hypertensive participants with diabetes, heart failure or chronic kidney disease.;Long Term: Maintenance of blood pressure at goal levels.   Lipids Yes   Intervention Provide education and support for participant on nutrition & aerobic/resistive exercise along with prescribed medications to achieve LDL 70mg , HDL >40mg .   Expected Outcomes Short Term: Participant  states understanding of desired cholesterol values and is compliant with medications prescribed. Participant is following exercise prescription and nutrition guidelines.;Long Term: Cholesterol controlled with medications as prescribed, with individualized exercise RX and with personalized nutrition plan. Value goals: LDL < 70mg , HDL > 40 mg.   Personal Goal Other Yes   Personal Goal Be able to walk through airport in 2 years in order to travel to [place] without symptoms of SOB   Intervention Provide exercise programming to assist with improving cardiorespiratory fitness   Expected Outcomes Pt will increase walking tolerance with improved symptoms of SOB      Core Components/Risk Factors/Patient Goals Review:    Core Components/Risk Factors/Patient Goals at Discharge (Final Review):    ITP Comments:     ITP Comments    Row Name 01/11/17 0814           ITP Comments Mecical Director- Dr. Fransico Him, MD          Comments: Judeth Porch is making expected progress toward personal goals after completing 18 sessions. Recommend continued exercise and life style modification education including  stress management and relaxation techniques to decrease cardiac risk profile. Judeth Porch is doing well with exercise here at cardiac rehab and at home.Barnet Pall, RN,BSN 03/07/2017 5:35 PM

## 2017-03-09 ENCOUNTER — Encounter (HOSPITAL_COMMUNITY)
Admission: RE | Admit: 2017-03-09 | Discharge: 2017-03-09 | Disposition: A | Discharge status: Home / Self Care | Payer: Medicare Other | Source: Ambulatory Visit | Attending: Cardiovascular Disease | Admitting: Cardiovascular Disease

## 2017-03-09 ENCOUNTER — Encounter (HOSPITAL_COMMUNITY): Payer: Medicare Other

## 2017-03-09 DIAGNOSIS — Z952 Presence of prosthetic heart valve: Secondary | ICD-10-CM

## 2017-03-09 DIAGNOSIS — I509 Heart failure, unspecified: Secondary | ICD-10-CM | POA: Diagnosis not present

## 2017-03-09 DIAGNOSIS — Z87891 Personal history of nicotine dependence: Secondary | ICD-10-CM | POA: Diagnosis not present

## 2017-03-11 ENCOUNTER — Encounter (HOSPITAL_COMMUNITY): Payer: Medicare Other

## 2017-03-11 ENCOUNTER — Telehealth (HOSPITAL_COMMUNITY): Payer: Self-pay | Admitting: Family Medicine

## 2017-03-16 ENCOUNTER — Encounter (HOSPITAL_COMMUNITY): Payer: Medicare Other

## 2017-03-16 ENCOUNTER — Encounter (HOSPITAL_COMMUNITY)
Admission: RE | Admit: 2017-03-16 | Discharge: 2017-03-16 | Disposition: A | Discharge status: Home / Self Care | Payer: Medicare Other | Source: Ambulatory Visit | Attending: Cardiovascular Disease | Admitting: Cardiovascular Disease

## 2017-03-16 DIAGNOSIS — I509 Heart failure, unspecified: Secondary | ICD-10-CM | POA: Diagnosis not present

## 2017-03-16 DIAGNOSIS — Z87891 Personal history of nicotine dependence: Secondary | ICD-10-CM | POA: Diagnosis not present

## 2017-03-16 DIAGNOSIS — Z952 Presence of prosthetic heart valve: Secondary | ICD-10-CM

## 2017-03-18 ENCOUNTER — Encounter (HOSPITAL_COMMUNITY): Payer: Medicare Other

## 2017-03-18 ENCOUNTER — Encounter (HOSPITAL_COMMUNITY)
Admission: RE | Admit: 2017-03-18 | Discharge: 2017-03-18 | Disposition: A | Discharge status: Home / Self Care | Payer: Medicare Other | Source: Ambulatory Visit | Attending: Cardiovascular Disease | Admitting: Cardiovascular Disease

## 2017-03-18 VITALS — Wt 181.9 lb

## 2017-03-18 DIAGNOSIS — I509 Heart failure, unspecified: Secondary | ICD-10-CM | POA: Insufficient documentation

## 2017-03-18 DIAGNOSIS — Z952 Presence of prosthetic heart valve: Secondary | ICD-10-CM

## 2017-03-18 DIAGNOSIS — Z87891 Personal history of nicotine dependence: Secondary | ICD-10-CM | POA: Insufficient documentation

## 2017-03-18 NOTE — Progress Notes (Signed)
Discharge Summary  Patient Details  Name: [name] MRN: [id number] Date of Birth: 1935-12-27 Referring Provider:     CARDIAC REHAB PHASE II ORIENTATION from 01/11/2017 in [facility]  Referring Provider  Darlina Guys MD and Fransico Him MD       Number of Visits: 21  Reason for Discharge:  Early Exit:  [name] is going to [place] for the summer  Smoking History:  History  Smoking Status  . Former Smoker  . Packs/day: 0.25  . Years: 8.00  . Types: Cigarettes  . Quit date: 10/18/1966  Smokeless Tobacco  . Never Used    Diagnosis:  S/P TAVR (transcatheter aortic valve replacement)  ADL UCSD:   Initial Exercise Prescription:     Initial Exercise Prescription - 01/11/17 1100      Date of Initial Exercise RX and Referring Provider   Date 01/11/17   Referring Provider Darlina Guys MD and Fransico Him MD     Recumbant Bike   Level 1.5   Minutes 10   METs 1.3     NuStep   Level 2   SPM 60   Minutes 10   METs 1.5     Track   Laps 9   Minutes 10   METs 2.57     Prescription Details   Frequency (times per week) 3   Duration Progress to 30 minutes of continuous aerobic without signs/symptoms of physical distress     Intensity   THRR 40-80% of Max Heartrate 56-113   Ratings of Perceived Exertion 11-13   Perceived Dyspnea 0-4     Progression   Progression Continue to progress workloads to maintain intensity without signs/symptoms of physical distress.     Resistance Training   Training Prescription Yes   Weight 1lb   Reps 10-15      Discharge Exercise Prescription (Final Exercise Prescription Changes):     Exercise Prescription Changes - 03/23/17 1400      Response to Exercise   Blood Pressure (Admit) 124/68   Blood Pressure (Exercise) 138/64   Blood Pressure (Exit) 104/64   Heart Rate (Admit) 62 bpm   Heart Rate (Exercise) 95 bpm   Heart Rate (Exit) 63 bpm   Rating of Perceived Exertion (Exercise)  10   Duration Progress to 45 minutes of aerobic exercise without signs/symptoms of physical distress   Intensity THRR unchanged     Progression   Progression Continue to progress workloads to maintain intensity without signs/symptoms of physical distress.   Average METs 3.5     Resistance Training   Training Prescription Yes   Weight 3lb   Reps 10-15     Recumbant Bike   Level 1.5   Minutes 10   METs 4.2     NuStep   Level 2   SPM 60   Minutes 10   METs 3.8     Track   Laps 9   Minutes 10   METs 2.6     Home Exercise Plan   Plans to continue exercise at Home (comment)   Frequency Add 3 additional days to program exercise sessions.   Initial Home Exercises Provided 02/07/17      Functional Capacity:     6 Minute Walk    Row Name 01/11/17 1113 01/11/17 1125 03/23/17 1406     6 Minute Walk   Phase Initial  - Discharge   Distance 1489 feet  - 1733 feet   Distance % Change  -  -  16.4 %   Walk Time 6 minutes  - 6 minutes   # of Rest Breaks 0  - 0   MPH  - 2.82 3.3   METS  - 1.99 3.3   RPE 11  - 10   Perceived Dyspnea  1  -  -   VO2 Peak  -  - 11.5   Symptoms Yes (comment)  - No   Comments mild SOB  -  -   Resting HR 70 bpm  - 66 bpm   Resting BP 124/57  - 116/70   Max Ex. HR 98 bpm  - 101 bpm   Max Ex. BP 130/70  - 154/68   2 Minute Post BP 110/60  - 124/64      Psychological, QOL, Others - Outcomes: PHQ 2/9: Depression screen [facility] 2/9 04/07/2017 01/17/2017  Decreased Interest 0 0  Down, Depressed, Hopeless 0 0  PHQ - 2 Score 0 0    Quality of Life:     Quality of Life - 01/11/17 0924      Quality of Life Scores   Health/Function Pre 28.88 %   Socioeconomic Pre 28.58 %   Psych/Spiritual Pre 29.17 %   Family Pre 28.5 %   GLOBAL Pre 28.82 %      Personal Goals: Goals established at orientation with interventions provided to work toward goal.     Personal Goals and Risk Factors at Admission - 01/11/17 1127      Core Components/Risk  Factors/Patient Goals on Admission    Weight Management Yes;Weight Maintenance;Weight Loss   Intervention Weight Management: Develop a combined nutrition and exercise program designed to reach desired caloric intake, while maintaining appropriate intake of nutrient and fiber, sodium and fats, and appropriate energy expenditure required for the weight goal.;Weight Management: Provide education and appropriate resources to help participant work on and attain dietary goals.;Weight Management/Obesity: Establish reasonable short term and long term weight goals.;Obesity: Provide education and appropriate resources to help participant work on and attain dietary goals.   Admit Weight 182 lb (82.6 kg)   Goal Weight: Short Term 176 lb (79.8 kg)   Goal Weight: Long Term 164 lb (74.4 kg)   Expected Outcomes Short Term: Continue to assess and modify interventions until short term weight is achieved;Long Term: Adherence to nutrition and physical activity/exercise program aimed toward attainment of established weight goal;Weight Maintenance: Understanding of the daily nutrition guidelines, which includes 25-35% calories from fat, 7% or less cal from saturated fats, less than 252m cholesterol, less than 1.5gm of sodium, & 5 or more servings of fruits and vegetables daily;Weight Loss: Understanding of general recommendations for a balanced deficit meal plan, which promotes 1-2 lb weight loss per week and includes a negative energy balance of [id number] kcal/d;Understanding recommendations for meals to include 15-35% energy as protein, 25-35% energy from fat, 35-60% energy from carbohydrates, less than 2076mof dietary cholesterol, 20-35 gm of total fiber daily;Understanding of distribution of calorie intake throughout the day with the consumption of 4-5 meals/snacks   Improve shortness of breath with ADL's Yes   Intervention Provide education, individualized exercise plan and daily activity instruction to help decrease  symptoms of SOB with activities of daily living.   Expected Outcomes Short Term: Achieves a reduction of symptoms when performing activities of daily living.   Hypertension Yes   Intervention Provide education on lifestyle modifcations including regular physical activity/exercise, weight management, moderate sodium restriction and increased consumption of fresh fruit, vegetables, and low fat dairy,  alcohol moderation, and smoking cessation.;Monitor prescription use compliance.   Expected Outcomes Short Term: Continued assessment and intervention until BP is < 140/34m HG in hypertensive participants. < 130/858mHG in hypertensive participants with diabetes, heart failure or chronic kidney disease.;Long Term: Maintenance of blood pressure at goal levels.   Lipids Yes   Intervention Provide education and support for participant on nutrition & aerobic/resistive exercise along with prescribed medications to achieve LDL <7064mHDL >75m42m Expected Outcomes Short Term: Participant states understanding of desired cholesterol values and is compliant with medications prescribed. Participant is following exercise prescription and nutrition guidelines.;Long Term: Cholesterol controlled with medications as prescribed, with individualized exercise RX and with personalized nutrition plan. Value goals: LDL < 70mg29mL > 40 mg.   Personal Goal Other Yes   Personal Goal Be able to walk through airport in 2 years in [place] of [place]   Intervention Provide exercise programming to assist with improving cardiorespiratory fitness   Expected Outcomes Pt will increase walking tolerance with improved symptoms of SOB       Personal Goals Discharge:   Nutrition & Weight - Outcomes:     Pre Biometrics - 01/11/17 1125      Pre Biometrics   Waist Circumference 37.5 inches   Hip Circumference 44 inches   Waist to Hip Ratio 0.85 %   Triceps Skinfold 26 mm   % Body Fat 40.3 %   Grip  Strength 23.5 kg   Flexibility 16 in   Single Leg Stand 30 seconds         Post Biometrics - 03/23/17 1419       Post  Biometrics   Weight 181 lb 14.1 oz (82.5 kg)   Waist Circumference 38.5 inches   Hip Circumference 44.5 inches   Waist to Hip Ratio 0.87 %   Triceps Skinfold 36 mm   % Body Fat 42.4 %   Grip Strength 22 kg   Flexibility 16.5 in   Single Leg Stand 7.6 seconds      Nutrition:     Nutrition Therapy & Goals - 01/12/17 1118      Nutrition Therapy   Diet Low Sodium      Personal Nutrition Goals   Nutrition Goal Wt loss of 1-2 lb per week to a wt loss goal of 6-18 lb at graduation from [facility] and counsel regarding individualized specific dietary modifications aiming towards targeted core components such as weight, hypertension, lipid management, diabetes, heart failure and other comorbidities.   Expected Outcomes Short Term Goal: Understand basic principles of dietary content, such as calories, fat, sodium, cholesterol and nutrients.;Long Term Goal: Adherence to prescribed nutrition plan.      Nutrition Discharge:     Nutrition Assessments - 03/28/17 1334      MEDFICTS Scores   Pre Score 0   Post Score --  Post-rehab MEDFICTS not received      Education Questionnaire Score:     Knowledge Questionnaire Score - 01/11/17 0924      Knowledge Questionnaire Score   Pre Score 16/24       Goals reviewed with patient.LilliJudeth Porchuated from cardiac rehab program today with completion of 21 exercise sessions in Phase II. Pt maintained good attendance and progressed nicely during his participation in rehab as evidenced by increased MET level.   Medication list reconciled. Repeat  PHQ score-0  Pt has made significant  lifestyle changes and should be commended for her success. [name] feels he has achieved his goals during cardiac rehab.   [name] plans to continue exercise by walking and riding her  bike. [name] finished the program early so that she could go to [place]. We are proud of Lillian's progress in the program.Jacqueline Venetia Maxon, RN,BSN 04/07/2017 12:42 PM

## 2017-03-21 ENCOUNTER — Encounter (HOSPITAL_COMMUNITY): Payer: Medicare Other

## 2017-03-23 ENCOUNTER — Encounter (HOSPITAL_COMMUNITY): Payer: Medicare Other

## 2017-03-25 ENCOUNTER — Encounter (HOSPITAL_COMMUNITY): Payer: Medicare Other

## 2017-03-28 ENCOUNTER — Encounter (HOSPITAL_COMMUNITY): Payer: Medicare Other

## 2017-03-30 ENCOUNTER — Encounter (HOSPITAL_COMMUNITY): Payer: Medicare Other

## 2017-04-01 ENCOUNTER — Encounter (HOSPITAL_COMMUNITY): Payer: Medicare Other

## 2017-04-04 ENCOUNTER — Encounter (HOSPITAL_COMMUNITY): Payer: Medicare Other

## 2017-04-06 ENCOUNTER — Encounter (HOSPITAL_COMMUNITY): Payer: Medicare Other

## 2017-04-07 NOTE — Addendum Note (Signed)
Encounter addended by: Magda Kiel, RN on: 04/07/2017 12:44 PM<BR>    Actions taken: Visit Navigator Flowsheet section accepted, Pend clinical note

## 2017-04-08 ENCOUNTER — Encounter (HOSPITAL_COMMUNITY): Payer: Medicare Other

## 2017-05-02 NOTE — Addendum Note (Signed)
Addendum  created 05/02/17 1711 by Annye Asa, MD   Sign clinical note

## 2017-06-10 DIAGNOSIS — H40003 Preglaucoma, unspecified, bilateral: Secondary | ICD-10-CM | POA: Diagnosis not present

## 2017-06-10 DIAGNOSIS — H25811 Combined forms of age-related cataract, right eye: Secondary | ICD-10-CM | POA: Diagnosis not present

## 2017-06-10 DIAGNOSIS — H25812 Combined forms of age-related cataract, left eye: Secondary | ICD-10-CM | POA: Diagnosis not present

## 2017-06-14 DIAGNOSIS — Z6830 Body mass index (BMI) 30.0-30.9, adult: Secondary | ICD-10-CM | POA: Diagnosis not present

## 2017-06-14 DIAGNOSIS — E669 Obesity, unspecified: Secondary | ICD-10-CM | POA: Diagnosis not present

## 2017-06-14 DIAGNOSIS — M064 Inflammatory polyarthropathy: Secondary | ICD-10-CM | POA: Diagnosis not present

## 2017-06-14 DIAGNOSIS — M2550 Pain in unspecified joint: Secondary | ICD-10-CM | POA: Diagnosis not present

## 2017-06-14 DIAGNOSIS — M154 Erosive (osteo)arthritis: Secondary | ICD-10-CM | POA: Diagnosis not present

## 2017-06-14 DIAGNOSIS — M150 Primary generalized (osteo)arthritis: Secondary | ICD-10-CM | POA: Diagnosis not present

## 2017-06-28 DIAGNOSIS — R0602 Shortness of breath: Secondary | ICD-10-CM | POA: Diagnosis not present

## 2017-06-28 DIAGNOSIS — Z23 Encounter for immunization: Secondary | ICD-10-CM | POA: Diagnosis not present

## 2017-06-28 DIAGNOSIS — R635 Abnormal weight gain: Secondary | ICD-10-CM | POA: Diagnosis not present

## 2017-07-18 DIAGNOSIS — H524 Presbyopia: Secondary | ICD-10-CM | POA: Diagnosis not present

## 2017-07-18 DIAGNOSIS — H25811 Combined forms of age-related cataract, right eye: Secondary | ICD-10-CM | POA: Diagnosis not present

## 2017-07-25 DIAGNOSIS — H25812 Combined forms of age-related cataract, left eye: Secondary | ICD-10-CM | POA: Diagnosis not present

## 2017-07-25 DIAGNOSIS — H2512 Age-related nuclear cataract, left eye: Secondary | ICD-10-CM | POA: Diagnosis not present

## 2017-08-25 DIAGNOSIS — R0602 Shortness of breath: Secondary | ICD-10-CM | POA: Diagnosis not present

## 2017-08-25 DIAGNOSIS — I4891 Unspecified atrial fibrillation: Secondary | ICD-10-CM | POA: Diagnosis not present

## 2017-08-25 DIAGNOSIS — Z952 Presence of prosthetic heart valve: Secondary | ICD-10-CM | POA: Diagnosis not present

## 2017-08-25 DIAGNOSIS — N183 Chronic kidney disease, stage 3 (moderate): Secondary | ICD-10-CM | POA: Diagnosis not present

## 2017-08-30 DIAGNOSIS — I350 Nonrheumatic aortic (valve) stenosis: Secondary | ICD-10-CM | POA: Diagnosis not present

## 2017-08-30 DIAGNOSIS — Z5181 Encounter for therapeutic drug level monitoring: Secondary | ICD-10-CM | POA: Diagnosis not present

## 2017-09-12 IMAGING — DX DG CHEST 1V PORT
1 series · 1 of 1 positions shown · non-contrast
Comparison: 12/10/2016

CLINICAL DATA: Post aortic valve repair

EXAM:
PORTABLE CHEST 1 VIEW

[chest ap]
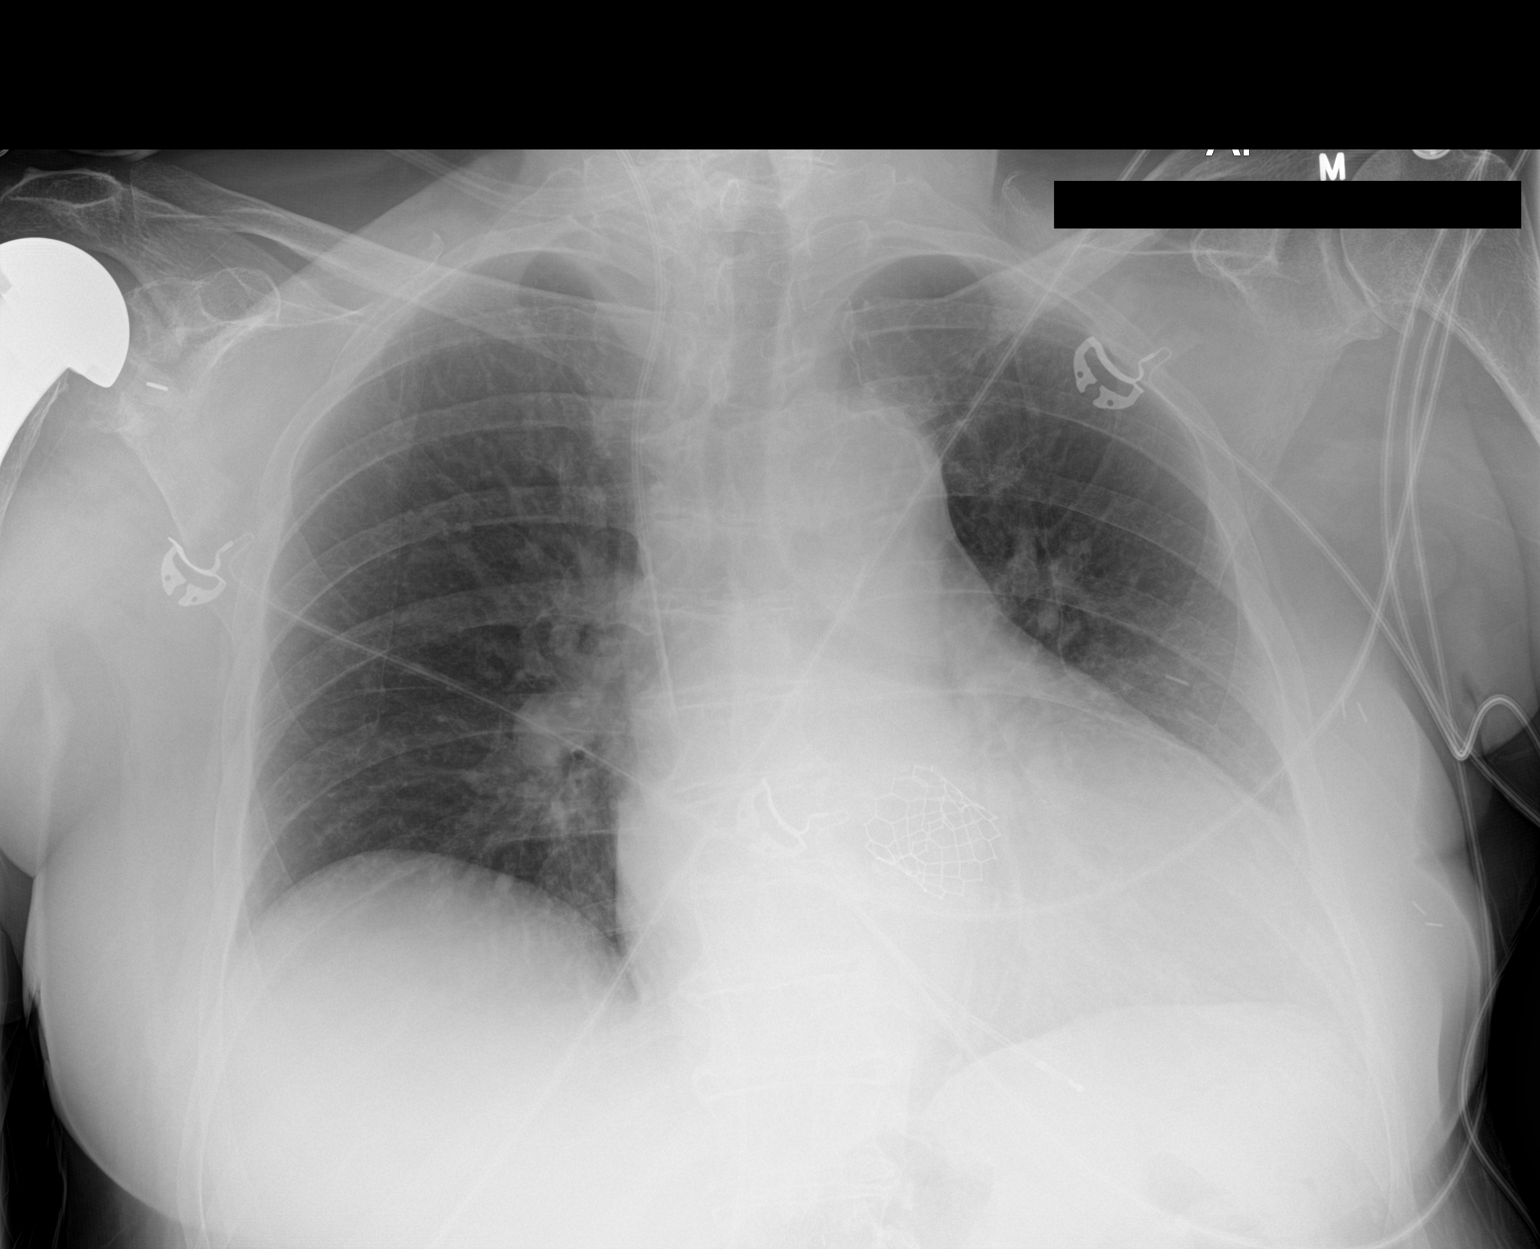

[1 of 1 positions shown; findings below may reference images not displayed]

FINDINGS: Changes of aortic valve repair. Right central line tip is at the
cavoatrial junction. No pneumothorax. Cardiomegaly. No confluent
opacities or effusions.
IMPRESSION: Post aortic valve repair.  Mild cardiomegaly.  No acute findings.

## 2017-09-13 IMAGING — CR DG CHEST 1V PORT
1 series · 1 of 1 positions shown · non-contrast
Comparison: 12/14/2016

CLINICAL DATA: Post transcatheter aortic valve replacement.

EXAM:
PORTABLE CHEST 1 VIEW

[AP]
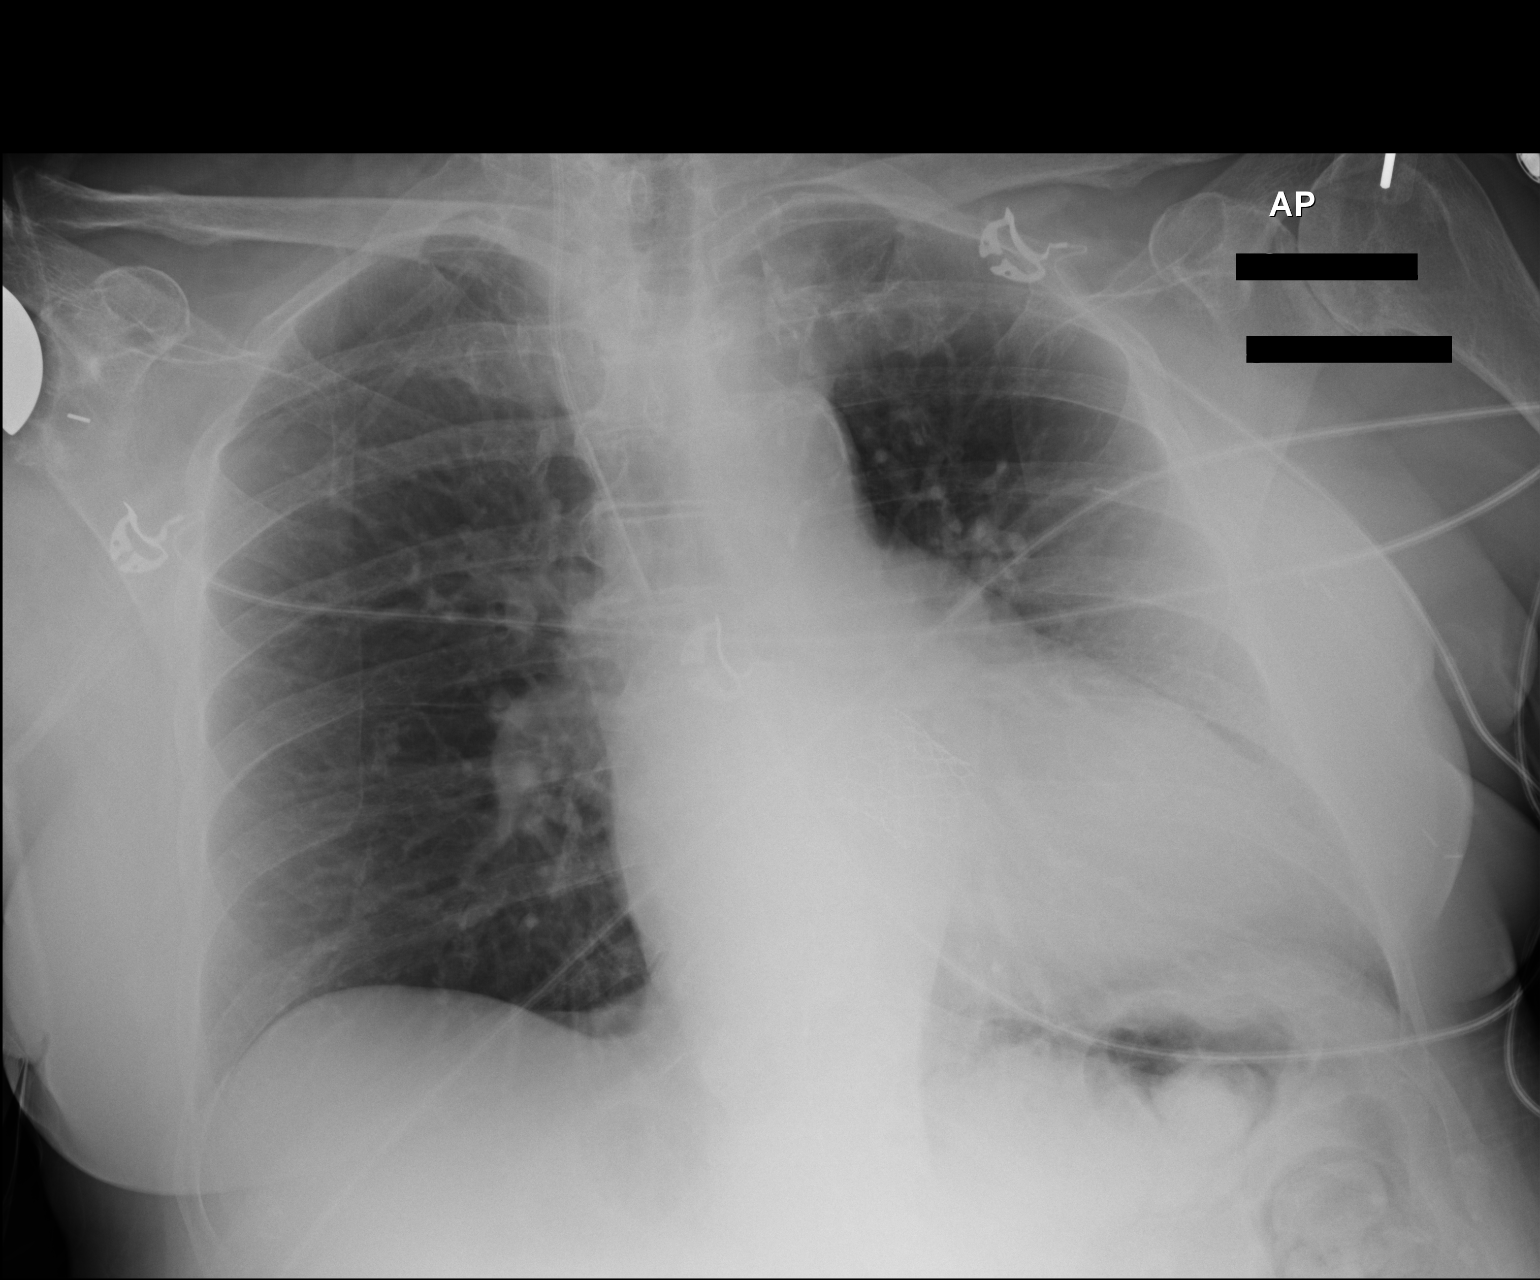

[1 of 1 positions shown; findings below may reference images not displayed]

FINDINGS: Cardiomegaly again noted. Again noted transcatheter aortic valve
replacement. No infiltrate or pulmonary edema. Right IJ central line
with tip in distal SVC. No pneumothorax. Mild left basilar
atelectasis.
IMPRESSION: Again noted status post transcatheter aortic valve replacement.
Cardiomegaly. No infiltrate or pulmonary edema. Mild left basilar
atelectasis.

## 2017-11-07 ENCOUNTER — Telehealth: Payer: Self-pay

## 2017-11-07 NOTE — Telephone Encounter (Signed)
Pt due to schedule 1 YEAR TAVR follow-up Echo and OV.  I attempted to reach her at the numbers listed in her chart.  Home number is no longer a working number.  The mobile number for spouse has a different name on voicemail.  Letter mailed to the pt's home to contact the office to arrange appointment.

## 2018-01-06 ENCOUNTER — Other Ambulatory Visit: Payer: Self-pay

## 2018-01-06 ENCOUNTER — Telehealth: Payer: Self-pay | Admitting: Cardiovascular Disease

## 2018-01-06 DIAGNOSIS — R0602 Shortness of breath: Secondary | ICD-10-CM

## 2018-01-06 NOTE — Telephone Encounter (Signed)
Patient needs order for Echo per [facility] notes.

## 2018-01-06 NOTE — Telephone Encounter (Signed)
Echo ordered placed for 4/22.Marland KitchenMarland Kitchen

## 2018-01-13 DIAGNOSIS — I10 Essential (primary) hypertension: Secondary | ICD-10-CM | POA: Diagnosis not present

## 2018-01-13 DIAGNOSIS — E782 Mixed hyperlipidemia: Secondary | ICD-10-CM | POA: Diagnosis not present

## 2018-01-31 DIAGNOSIS — E782 Mixed hyperlipidemia: Secondary | ICD-10-CM | POA: Diagnosis not present

## 2018-01-31 DIAGNOSIS — I10 Essential (primary) hypertension: Secondary | ICD-10-CM | POA: Diagnosis not present

## 2018-02-06 ENCOUNTER — Encounter: Payer: Self-pay | Admitting: Cardiovascular Disease

## 2018-02-06 ENCOUNTER — Ambulatory Visit (INDEPENDENT_AMBULATORY_CARE_PROVIDER_SITE_OTHER): Payer: Medicare Other | Admitting: Cardiovascular Disease

## 2018-02-06 ENCOUNTER — Other Ambulatory Visit: Payer: Self-pay

## 2018-02-06 ENCOUNTER — Ambulatory Visit (HOSPITAL_COMMUNITY): Payer: Medicare Other | Attending: Internal Medicine

## 2018-02-06 VITALS — BP 138/72 | HR 86 | Ht 67.0 in | Wt 196.0 lb

## 2018-02-06 DIAGNOSIS — Z952 Presence of prosthetic heart valve: Secondary | ICD-10-CM | POA: Diagnosis not present

## 2018-02-06 DIAGNOSIS — I350 Nonrheumatic aortic (valve) stenosis: Secondary | ICD-10-CM | POA: Diagnosis not present

## 2018-02-06 DIAGNOSIS — R0602 Shortness of breath: Secondary | ICD-10-CM | POA: Insufficient documentation

## 2018-02-06 NOTE — Progress Notes (Signed)
Valve Clinic Follow up Note  Chief Complaint  Patient presents with  . Follow-up    one year TAVR follow up   History of Present Illness: 82 yo female with history of CAD, HTN, HLD, chronic diastolic CHF, persistent atrial fibrillation and severe aortic stenosis now s/p TAVR who is here today for one year TAVR follow up. She has been followed in our office by Dr. Radford Pax. She had progressive aortic stenosis with dyspnea and underwent TAVR with 26 mm Edwards Sapien 3 bioprosthetic aortic valve from the right femoral approach on 12/14/16. She did well following the procedure.   She is here today for follow up. The patient denies any chest pain, dyspnea, palpitations, lower extremity edema, orthopnea, PND, dizziness, near syncope or syncope.   Primary Care Physician: Maurice Small, MD Primary Cardiologist: Dr. Radford Pax    Past Medical History:  Diagnosis Date  . Anginal pain ([place])   . Aortic stenosis    severe AS with moderate AR by echo with normal LVF  . Arthritis   . CAD in native artery, non obstructive cath 11/2016.  12/16/2016  . Cancer [facility])    left breast mastectomy(cancer)-surgery only  . Chronic diastolic congestive heart failure ([place])   . Complication of anesthesia    extremely claustophobic(only occurs with narcotic medications as stated per pt)  . Diverticulosis   . Fibromyalgia   . GERD (gastroesophageal reflux disease)   . Hard of hearing    wears hearing aides   . History of breast cancer no recurrence   dx 2004   s/p  left breast mastectomy w/ node dissection/   no chemo or radiation  . History of TIA (transient ischemic attack)    per scan--  no residual  . Hyperlipidemia   . Hypertension   . Nocturia   . OSA on CPAP     severe/  AHI 34/hr  . Permanent atrial fibrillation ([facility])    on Xarelto for CHADS2VASC score of 5  . Pulmonary HTN ([place])    PASP 63mmHg by echo 11/2016  . Stroke ([place])    tia  . Urge urinary incontinence   . Wears glasses     Past Surgical  History:  Procedure Laterality Date  . ABDOMINOPLASTY  1987  . APPENDECTOMY  1965  . BREAST SURGERY Left 2004   mastectomy   . CARDIAC CATHETERIZATION N/A 11/19/2016   Procedure: Right/Left Heart Cath and Coronary Angiography;  Surgeon: Troy Sine, MD;  Location: [place] [facility];  Service: Cardiovascular;  Laterality: N/A;  . CARPAL TUNNEL RELEASE Right 07-08-2004  . COLONOSCOPY WITH ESOPHAGOGASTRODUODENOSCOPY (EGD)  2006  . EXPLANTATION BREAST IMPLANT AND DEBRIDEMENT Left 09/15/2005  . INTERSTIM IMPLANT PLACEMENT N/A 08/22/2014   Procedure: Barrie Lyme IMPLANT STAGE ONE/TWO;  Surgeon: Reece Packer, MD;  Location: [facility];  Service: Urology;  Laterality: N/A;  . JOINT REPLACEMENT Left 2017   knee replacement  . MASTECTOMY    . MASTECTOMY, MODIFIED RADICAL W/RECONSTRUCTION  09-16-2003   left breast W/ SLN DISSECTION (pt states had 13 operations for implant infection   . REPLACE TISSUE EXPANDER LEFT BREAST AND TOTAL CAPSULECTOMY  08-05-2006  . REVISION BREAST RECONSTRUCTION Left 11-11-2004   12-03-2004  DRAINAGE SEREMA LEFT CHEST  . TEE WITHOUT CARDIOVERSION N/A 12/14/2016   Procedure: TRANSESOPHAGEAL ECHOCARDIOGRAM (TEE);  Surgeon: Burnell Blanks, MD;  Location: [facility];  Service: Open Heart Surgery;  Laterality: N/A;  . TOTAL HIP ARTHROPLASTY Left 07/17/2013   Procedure:  LEFT TOTAL HIP ARTHROPLASTY ANTERIOR APPROACH;  Surgeon: Mauri Pole, MD;  Location: [facility];  Service: Orthopedics;  Laterality: Left;  . TOTAL HIP ARTHROPLASTY Right 01/28/2015   Procedure: RIGHT TOTAL HIP ARTHROPLASTY ANTERIOR APPROACH;  Surgeon: Paralee Cancel, MD;  Location: [facility];  Service: Orthopedics;  Laterality: Right;  . TOTAL KNEE ARTHROPLASTY Left 11/11/2015   Procedure: LEFT TOTAL KNEE ARTHROPLASTY;  Surgeon: Paralee Cancel, MD;  Location: [facility];  Service: Orthopedics;  Laterality: Left;  . TOTAL SHOULDER ARTHROPLASTY  08/11/2012   Procedure: TOTAL SHOULDER ARTHROPLASTY;   Surgeon: Augustin Schooling, MD;  Location: [facility];  Service: Orthopedics;  Laterality: Right;  RIGHT  TOTAL SHOULDER  ARTHROPLASTY   . TRANSCATHETER AORTIC VALVE REPLACEMENT, TRANSFEMORAL N/A 12/14/2016   Procedure: TRANSCATHETER AORTIC VALVE REPLACEMENT, TRANSFEMORAL;  Surgeon: Burnell Blanks, MD;  Location: [facility];  Service: Open Heart Surgery;  Laterality: N/A;  . TRANSTHORACIC ECHOCARDIOGRAM  02-25-2014   moderate LVH/  ef 55-60%/  mod. to sev. calcification AV with mild to moderate AV stenosis/  mild to mod. AR and MR/ mild PR/   moderate to severe LAE  . TUBAL LIGATION  1973    Current Outpatient Medications  Medication Sig Dispense Refill  . acetaminophen (TYLENOL) 325 MG tablet Take 650 mg by mouth 2 (two) times daily.    Marland Kitchen amoxicillin (AMOXIL) 500 MG capsule TAKE 4 CAPSULES BY MOUTH 1 HOUR BEFORE DENTAL APPT FOR PREMED  0  . atorvastatin (LIPITOR) 20 MG tablet Take 20 mg by mouth daily.    . Calcium Carbonate-Vitamin D (CALCIUM-VITAMIN D) 500-200 MG-UNIT tablet Take 1 tablet by mouth daily.    Marland Kitchen diltiazem (TIAZAC) 180 MG 24 hr capsule Take 180 mg by mouth 2 (two) times daily.    . furosemide (LASIX) 20 MG tablet Take 20 mg by mouth as needed.    Marland Kitchen glucosamine-chondroitin 500-400 MG tablet Take 1 tablet by mouth 2 (two) times daily.    Marland Kitchen losartan (COZAAR) 50 MG tablet Take 50 mg by mouth daily.    . Melatonin 10 MG TABS Take 1 tablet by mouth at bedtime.     . nitroGLYCERIN (NITROSTAT) 0.4 MG SL tablet Place 0.4 mg under the tongue every 5 (five) minutes as needed for chest pain. For chest pain     . omeprazole (PRILOSEC) 20 MG capsule Take 20 mg by mouth daily.     . polycarbophil (FIBER-CAPS) 625 MG tablet Take 625 mg by mouth daily.    . predniSONE (DELTASONE) 5 MG tablet Take 5 mg by mouth daily with breakfast.    . rivaroxaban (XARELTO) 20 MG TABS tablet Take 20 mg by mouth daily with supper.     No current facility-administered medications for this visit.     Allergies    Allergen Reactions  . Beta Adrenergic Blockers Shortness Of Breath  . Betapace [Sotalol Hcl] Shortness Of Breath  . Other     Narcotic pain medications - anxiety attacks  . Oxycodone Shortness Of Breath    Tolerates Dilaudid, Fentanyl  . Clonidine Derivatives Palpitations    bradycardia  . Crestor [Rosuvastatin Calcium] Other (See Comments)    MYALGIAS  . Adhesive [Tape] Rash     bandaids   . Latex Rash    Per patient-bandaids- no problems with gloves  . Tramadol Anxiety    Can take if absolutely necessary    Social History   Socioeconomic History  . Marital status: Married    Spouse name: Not  on file  . Number of children: Not on file  . Years of education: Not on file  . Highest education level: Not on file  Occupational History  . Not on file  Social Needs  . Financial resource strain: Not on file  . Food insecurity:    Worry: Not on file    Inability: Not on file  . Transportation needs:    Medical: Not on file    Non-medical: Not on file  Tobacco Use  . Smoking status: Former Smoker    Packs/day: 0.25    Years: 8.00    Pack years: 2.00    Types: Cigarettes    Last attempt to quit: 10/18/1966    Years since quitting: 51.3  . Smokeless tobacco: Never Used  Substance and Sexual Activity  . Alcohol use: No  . Drug use: No  . Sexual activity: Yes  Lifestyle  . Physical activity:    Days per week: Not on file    Minutes per session: Not on file  . Stress: Not on file  Relationships  . Social connections:    Talks on phone: Not on file    Gets together: Not on file    Attends religious service: Not on file    Active member of club or organization: Not on file    Attends meetings of clubs or organizations: Not on file    Relationship status: Not on file  . Intimate partner violence:    Fear of current or ex partner: Not on file    Emotionally abused: Not on file    Physically abused: Not on file    Forced sexual activity: Not on file  Other Topics  Concern  . Not on file  Social History Narrative  . Not on file    Family History  Problem Relation Age of Onset  . Heart disease Mother   . Heart disease Father     Review of Systems:  As stated in the HPI and otherwise negative.   BP 138/72   Pulse 86   Ht 5\' 7"  (1.702 m)   Wt 196 lb (88.9 kg)   SpO2 96%   BMI 30.70 kg/m   Physical Examination:  General: Well developed, well nourished, NAD  HEENT: OP clear, mucus membranes moist  SKIN: warm, dry. No rashes. Neuro: No focal deficits  Musculoskeletal: Muscle strength 5/5 all ext  Psychiatric: Mood and affect normal  Neck: No JVD, no carotid bruits, no thyromegaly, no lymphadenopathy.  Lungs:Clear bilaterally, no wheezes, rhonci, crackles Cardiovascular: Regular rate and rhythm. Systolic murmur.  Abdomen:Soft. Bowel sounds present. Non-tender.  Extremities: No lower extremity edema. Pulses are 2 + in the bilateral DP/PT.  Echo 02/06/18: Normal LV function. Bioprosthetic aortic valve is working well. No AI. Mean gradient 13 mmHg Official report to follow  EKG:  EKG is ordered today. The ekg ordered today demonstrates atrial fib, rate 86 bpm.   Recent Labs: No results found for requested labs within last 8760 hours.   Lipid Panel No results found for: CHOL, TRIG, HDL, CHOLHDL, VLDL, LDLCALC, LDLDIRECT   Wt Readings from Last 3 Encounters:  02/06/18 196 lb (88.9 kg)  03/23/17 181 lb 14.1 oz (82.5 kg)  01/14/17 181 lb 3.2 oz (82.2 kg)     Other studies Reviewed: Additional studies/ records that were reviewed today include: I have reviewed her echo images pesonally Review of the above records demonstrates:   Assessment and Plan:   1. Severe aortic stenosis: She  is one year post TAVR. She is NYHA class 1. No dyspnea. She is on Xarelto. Echo today shows normally functioning AVR with no AI, mean gradient 13 mmHg.   Current medicines are reviewed at length with the patient today.  The patient does not have concerns  regarding medicines.  The following changes have been made:  no change  Labs/ tests ordered today include:  No orders of the defined types were placed in this encounter.  Disposition:   Follow up with Dr. Radford Pax as planned in October 2019.    Signed, Lauree Chandler, MD 02/06/2018 2:47 PM    [address], [place],   [address] Phone: [phone]; Fax: [phone]

## 2018-02-06 NOTE — Patient Instructions (Signed)
Medication Instructions:  Your physician recommends that you continue on your current medications as directed. Please refer to the Current Medication list given to you today.   Labwork: none  Testing/Procedures: none  Follow-Up: Your physician recommends that you schedule a follow-up appointment in: October with Dr. Radford Pax.  Please call us in June to schedule this appointment    Any Other Special Instructions Will Be Listed Below (If Applicable).     If you need a refill on your cardiac medications before your next appointment, please call your pharmacy.

## 2018-02-09 DIAGNOSIS — L570 Actinic keratosis: Secondary | ICD-10-CM | POA: Diagnosis not present

## 2018-02-09 DIAGNOSIS — L820 Inflamed seborrheic keratosis: Secondary | ICD-10-CM | POA: Diagnosis not present

## 2018-02-09 DIAGNOSIS — I788 Other diseases of capillaries: Secondary | ICD-10-CM | POA: Diagnosis not present

## 2018-03-22 ENCOUNTER — Encounter: Payer: Self-pay | Admitting: Thoracic Surgery (Cardiothoracic Vascular Surgery)

## 2018-07-04 ENCOUNTER — Encounter: Payer: Self-pay | Admitting: Physician Assistant

## 2018-07-04 ENCOUNTER — Ambulatory Visit (INDEPENDENT_AMBULATORY_CARE_PROVIDER_SITE_OTHER): Payer: Medicare Other | Admitting: Physician Assistant

## 2018-07-04 VITALS — BP 130/72 | HR 53 | Ht 67.0 in | Wt 195.0 lb

## 2018-07-04 DIAGNOSIS — Z952 Presence of prosthetic heart valve: Secondary | ICD-10-CM | POA: Diagnosis not present

## 2018-07-04 DIAGNOSIS — R0609 Other forms of dyspnea: Secondary | ICD-10-CM | POA: Diagnosis not present

## 2018-07-04 DIAGNOSIS — I482 Chronic atrial fibrillation: Secondary | ICD-10-CM

## 2018-07-04 DIAGNOSIS — I5032 Chronic diastolic (congestive) heart failure: Secondary | ICD-10-CM | POA: Diagnosis not present

## 2018-07-04 DIAGNOSIS — I2510 Atherosclerotic heart disease of native coronary artery without angina pectoris: Secondary | ICD-10-CM

## 2018-07-04 DIAGNOSIS — E7800 Pure hypercholesterolemia, unspecified: Secondary | ICD-10-CM

## 2018-07-04 DIAGNOSIS — R0600 Dyspnea, unspecified: Secondary | ICD-10-CM

## 2018-07-04 DIAGNOSIS — I4821 Permanent atrial fibrillation: Secondary | ICD-10-CM

## 2018-07-04 NOTE — Patient Instructions (Signed)
Medication Instructions:  Your physician recommends that you continue on your current medications as directed. Please refer to the Current Medication list given to you today.   Labwork: None ordered  Testing/Procedures: None ordered  Follow-Up: Your physician wants you to follow-up in: 6 months Dr. Radford Pax. You will receive a reminder letter in the mail two months in advance. If you don't receive a letter, please call our office to schedule the follow-up appointment.   Any Other Special Instructions Will Be Listed Below (If Applicable).     If you need a refill on your cardiac medications before your next appointment, please call your pharmacy.

## 2018-07-04 NOTE — Progress Notes (Signed)
Cardiology Office Note    Date:  07/04/2018   ID:  [name], DOB 1936-10-16, MRN [id number]  PCP:  Maurice Small, MD  Cardiologist:  Dr. Gale Journey: Dr. Angelena Form  Chief Complaint: 6  Months follow up  History of Present Illness:   [name] is a 82 y.o. female with history of CAD, HTN, HLD, chronic diastolic CHF, persistent atrial fibrillation, breast cancer and severe aortic stenosis now s/p TAVR presents for follow up.   She had progressive aortic stenosis with dyspnea and underwent TAVR with 26 mm Edwards Sapien 3 bioprosthetic aortic valve from the right femoral approach on 12/14/16. Cath prior to [facility] showed CAD consisting of 70% focal excentric ostial narrowing of 1st diag branch of LAD and superior takeoff dominant RCA, with mild 30-40% very proximal stenosis eccentrically. Normal ramus intermediate and left circumflex coronary arteries.  Here today for follow up.  Patient chronic on exertion.  Stable.  She is planning to start exercise regimen.  Denies exertional chest heaviness chest pressure.  No orthopnea, PND, syncope, lower extremity edema or melena.  She has nitroglycerin but never required to take it.  Denies bleeding issue.  Compliant with medication.  Takes Lasix 20 mg daily.  Past Medical History:  Diagnosis Date  . Anginal pain ([place])   . Aortic stenosis    severe AS with moderate AR by echo with normal LVF  . Arthritis   . CAD in native artery, non obstructive cath 11/2016.  12/16/2016  . Cancer [facility])    left breast mastectomy(cancer)-surgery only  . Chronic diastolic congestive heart failure ([place])   . Complication of anesthesia    extremely claustophobic(only occurs with narcotic medications as stated per pt)  . Diverticulosis   . Fibromyalgia   . GERD (gastroesophageal reflux disease)   . Hard of hearing    wears hearing aides   . History of breast cancer no recurrence   dx 2004   s/p  left breast mastectomy w/ node dissection/   no chemo  or radiation  . History of TIA (transient ischemic attack)    per scan--  no residual  . Hyperlipidemia   . Hypertension   . Nocturia   . OSA on CPAP     severe/  AHI 34/hr  . Permanent atrial fibrillation ([facility])    on Xarelto for CHADS2VASC score of 5  . Pulmonary HTN ([place])    PASP 73mmHg by echo 11/2016  . Stroke ([place])    tia  . Urge urinary incontinence   . Wears glasses     Past Surgical History:  Procedure Laterality Date  . ABDOMINOPLASTY  1987  . APPENDECTOMY  1965  . BREAST SURGERY Left 2004   mastectomy   . CARDIAC CATHETERIZATION N/A 11/19/2016   Procedure: Right/Left Heart Cath and Coronary Angiography;  Surgeon: Troy Sine, MD;  Location: [facility];  Service: Cardiovascular;  Laterality: N/A;  . CARPAL TUNNEL RELEASE Right 07-08-2004  . COLONOSCOPY WITH ESOPHAGOGASTRODUODENOSCOPY (EGD)  2006  . EXPLANTATION BREAST IMPLANT AND DEBRIDEMENT Left 09/15/2005  . INTERSTIM IMPLANT PLACEMENT N/A 08/22/2014   Procedure: Barrie Lyme IMPLANT STAGE ONE/TWO;  Surgeon: Reece Packer, MD;  Location: [facility];  Service: Urology;  Laterality: N/A;  . JOINT REPLACEMENT Left 2017   knee replacement  . MASTECTOMY    . MASTECTOMY, MODIFIED RADICAL W/RECONSTRUCTION  09-16-2003   left breast W/ SLN DISSECTION (pt states had 13 operations for implant infection   .  REPLACE TISSUE EXPANDER LEFT BREAST AND TOTAL CAPSULECTOMY  08-05-2006  . REVISION BREAST RECONSTRUCTION Left 11-11-2004   12-03-2004  DRAINAGE SEREMA LEFT CHEST  . TEE WITHOUT CARDIOVERSION N/A 12/14/2016   Procedure: TRANSESOPHAGEAL ECHOCARDIOGRAM (TEE);  Surgeon: Burnell Blanks, MD;  Location: [facility];  Service: Open Heart Surgery;  Laterality: N/A;  . TOTAL HIP ARTHROPLASTY Left 07/17/2013   Procedure: LEFT TOTAL HIP ARTHROPLASTY ANTERIOR APPROACH;  Surgeon: Mauri Pole, MD;  Location: [facility];  Service: Orthopedics;  Laterality: Left;  . TOTAL HIP ARTHROPLASTY Right 01/28/2015    Procedure: RIGHT TOTAL HIP ARTHROPLASTY ANTERIOR APPROACH;  Surgeon: Paralee Cancel, MD;  Location: [facility];  Service: Orthopedics;  Laterality: Right;  . TOTAL KNEE ARTHROPLASTY Left 11/11/2015   Procedure: LEFT TOTAL KNEE ARTHROPLASTY;  Surgeon: Paralee Cancel, MD;  Location: [facility];  Service: Orthopedics;  Laterality: Left;  . TOTAL SHOULDER ARTHROPLASTY  08/11/2012   Procedure: TOTAL SHOULDER ARTHROPLASTY;  Surgeon: Augustin Schooling, MD;  Location: [facility];  Service: Orthopedics;  Laterality: Right;  RIGHT  TOTAL SHOULDER  ARTHROPLASTY   . TRANSCATHETER AORTIC VALVE REPLACEMENT, TRANSFEMORAL N/A 12/14/2016   Procedure: TRANSCATHETER AORTIC VALVE REPLACEMENT, TRANSFEMORAL;  Surgeon: Burnell Blanks, MD;  Location: [facility];  Service: Open Heart Surgery;  Laterality: N/A;  . TRANSTHORACIC ECHOCARDIOGRAM  02-25-2014   moderate LVH/  ef 55-60%/  mod. to sev. calcification AV with mild to moderate AV stenosis/  mild to mod. AR and MR/ mild PR/   moderate to severe LAE  . TUBAL LIGATION  1973    Current Medications: Prior to Admission medications   Medication Sig Start Date End Date Taking? Authorizing Provider  acetaminophen (TYLENOL) 325 MG tablet Take 650 mg by mouth 2 (two) times daily.    [provider]  amoxicillin (AMOXIL) 500 MG capsule TAKE 4 CAPSULES BY MOUTH 1 HOUR BEFORE DENTAL APPT FOR PREMED 09/03/15   [provider]  atorvastatin (LIPITOR) 20 MG tablet Take 20 mg by mouth daily.    [provider]  Calcium Carbonate-Vitamin D (CALCIUM-VITAMIN D) 500-200 MG-UNIT tablet Take 1 tablet by mouth daily.    [provider]  diltiazem (TIAZAC) 180 MG 24 hr capsule Take 180 mg by mouth 2 (two) times daily.    [provider]  furosemide (LASIX) 20 MG tablet Take 20 mg by mouth as needed.    [provider]  glucosamine-chondroitin 500-400 MG tablet Take 1 tablet by mouth 2 (two) times daily.    [provider]  losartan (COZAAR) 50  MG tablet Take 50 mg by mouth daily.    [provider]  Melatonin 10 MG TABS Take 1 tablet by mouth at bedtime.     [provider]  nitroGLYCERIN (NITROSTAT) 0.4 MG SL tablet Place 0.4 mg under the tongue every 5 (five) minutes as needed for chest pain. For chest pain     [provider]  omeprazole (PRILOSEC) 20 MG capsule Take 20 mg by mouth daily.     [provider]  polycarbophil (FIBER-CAPS) 625 MG tablet Take 625 mg by mouth daily.    [provider]  predniSONE (DELTASONE) 5 MG tablet Take 5 mg by mouth daily with breakfast.    [provider]  rivaroxaban (XARELTO) 20 MG TABS tablet Take 20 mg by mouth daily with supper.    [provider]    Allergies:   Beta adrenergic blockers; Betapace [sotalol hcl]; Other; Oxycodone; Clonidine derivatives; Crestor [rosuvastatin calcium];  Adhesive [tape]; Latex; and Tramadol   Social History   Socioeconomic History  . Marital status: Married    Spouse name: Not on file  . Number of children: Not on file  . Years of education: Not on file  . Highest education level: Not on file  Occupational History  . Not on file  Social Needs  . Financial resource strain: Not on file  . Food insecurity:    Worry: Not on file    Inability: Not on file  . Transportation needs:    Medical: Not on file    Non-medical: Not on file  Tobacco Use  . Smoking status: Former Smoker    Packs/day: 0.25    Years: 8.00    Pack years: 2.00    Types: Cigarettes    Last attempt to quit: 10/18/1966    Years since quitting: 51.7  . Smokeless tobacco: Never Used  Substance and Sexual Activity  . Alcohol use: No  . Drug use: No  . Sexual activity: Yes  Lifestyle  . Physical activity:    Days per week: Not on file    Minutes per session: Not on file  . Stress: Not on file  Relationships  . Social connections:    Talks on phone: Not on file    Gets together: Not on file    Attends religious  service: Not on file    Active member of club or organization: Not on file    Attends meetings of clubs or organizations: Not on file    Relationship status: Not on file  Other Topics Concern  . Not on file  Social History Narrative  . Not on file     Family History:  The patient's family history includes Heart disease in her father and mother.   ROS:   Please see the history of present illness.    ROS All other systems reviewed and are negative.   PHYSICAL EXAM:   VS:  BP 130/72   Pulse (!) 53   Ht 5\' 7"  (1.702 m)   Wt 195 lb (88.5 kg)   SpO2 93%   BMI 30.54 kg/m    GEN: Well nourished, well developed, in no acute distress  HEENT: normal  Neck: no JVD, carotid bruits, or masses Cardiac: Irregularly irregular; no murmurs, rubs, or gallops,no edema  Respiratory:  clear to auscultation bilaterally, normal work of breathing GI: soft, nontender, nondistended, + BS MS: no deformity or atrophy  Skin: warm and dry, no rash Neuro:  Alert and Oriented x 3, Strength and sensation are intact Psych: euthymic mood, full affect  Wt Readings from Last 3 Encounters:  07/04/18 195 lb (88.5 kg)  02/06/18 196 lb (88.9 kg)  03/23/17 181 lb 14.1 oz (82.5 kg)      Studies/Labs Reviewed:   EKG:  EKG is not ordered today.    Recent Labs: No results found for requested labs within last 8760 hours.   Lipid Panel No results found for: CHOL, TRIG, HDL, CHOLHDL, VLDL, LDLCALC, LDLDIRECT  Additional studies/ records that were reviewed today include:   Echocardiogram: 02/06/2018 Study Conclusions  - Left ventricle: The cavity size was normal. There was moderate   concentric hypertrophy. Systolic function was normal. The   estimated ejection fraction was in the range of 55% to 60%. The   study is not technically sufficient to allow evaluation of LV   diastolic function. - Aortic valve: s/p 26 mm Edwards Sapien TAVR valve. No obstruction  or paravalvular leak. Mean gradient (S): 11 mm  Hg. Peak gradient   (S): 20 mm Hg. Valve area (VTI): 1.24 cm^2. Valve area (Vmax):   1.18 cm^2. Valve area (Vmean): 1.28 cm^2. - Mitral valve: Mildly thickened leaflets . There was mild   regurgitation. - Left atrium: Severely dilated. - Right atrium: The atrium was mildly dilated. - Pulmonary arteries: PA peak pressure: 48 mm Hg (S). - Inferior vena cava: The vessel was normal in size. The   respirophasic diameter changes were in the normal range (= 50%),   consistent with normal central venous pressure.  Impressions:  - Stable TAVR valve, no paravalvular leak. Normal LV function.  Right/Left Heart Cath and Coronary Angiography  11/2016  Conclusion     Ost RCA lesion, 35 %stenosed.  Ost 1st Diag lesion, 70 %stenosed.  The left ventricular systolic function is normal.  LV end diastolic pressure is normal.  There is severe aortic valve stenosis.   Normal LV function with at least 1+ MR.  Mild right heart pressure elevation with mild pulmonary hypertension.  Moderately severe aortic valve stenosis with a peak gradient of 29 and mean gradient of 21.7.  Aortic valve area calculated to 0.8 cm2.   Mild coronary obstructive disease with 70% focal eccentric ostial narrowing of the first diagonal branch of the LAD and superior takeoff dominant RCA with mild 30-40% very proximal stenosis eccentrically.  Normal ramus intermediate and left circumflex coronary arteries.  Mild calcification of the ascending aorta.  Supravalvular aortography was not performed to assess for aortic regurgitation.  RECOMMENDATION: Consider surgical evaluation for TAVR       ASSESSMENT & PLAN:    1. Dyspnea on exertion Patient has chronic dyspnea on exertion which is stable.Could be angina equivalent as he has 70% first diagonal lesion versus combination of factor including obesity age and deconditioning.  However, never required to take any nitroglycerin.  She is planning to increase her  exercise.  Agree with the plan.  Take PRN nitroglycerin as needed.  If worsening symptoms she will let us know.  2.  CAD -As discussed above.  Medically managed.  Continue statin.  Not on aspirin due to need of anticoagulation.  3.  Permanent atrial fibrillation -No bleeding issue.  Rate controlled.  Denies palpitation.  Continue Xarelto for anticoagulation  4.  Chronic diastolic heart failure -Euvolemic.  Continue Lasix and losartan.  5.  Hypertension -Blood pressure stable on current medication.  6. HLD - followed by PCP. On statin. LDL goal less than 70. Has follow up with PCP in few weeks.   Medication Adjustments/Labs and Tests Ordered: Current medicines are reviewed at length with the patient today.  Concerns regarding medicines are outlined above.  Medication changes, Labs and Tests ordered today are listed in the Patient Instructions below. Patient Instructions  Medication Instructions:  Your physician recommends that you continue on your current medications as directed. Please refer to the Current Medication list given to you today.   Labwork: None ordered  Testing/Procedures: None ordered  Follow-Up: Your physician wants you to follow-up in: 6 months Dr. Radford Pax. You will receive a reminder letter in the mail two months in advance. If you don't receive a letter, please call our office to schedule the follow-up appointment.   Any Other Special Instructions Will Be Listed Below (If Applicable).     If you need a refill on your cardiac medications before your next appointment, please call your pharmacy.      Signed,  Leanor Kail, [place]  07/04/2018 10:01 AM    [facility] [address], [place], [place]  [address] Phone: [phone]; Fax: [phone]

## 2018-07-13 DIAGNOSIS — L821 Other seborrheic keratosis: Secondary | ICD-10-CM | POA: Diagnosis not present

## 2018-07-13 DIAGNOSIS — I788 Other diseases of capillaries: Secondary | ICD-10-CM | POA: Diagnosis not present

## 2018-07-26 DIAGNOSIS — Z23 Encounter for immunization: Secondary | ICD-10-CM | POA: Diagnosis not present

## 2018-07-26 DIAGNOSIS — R399 Unspecified symptoms and signs involving the genitourinary system: Secondary | ICD-10-CM | POA: Diagnosis not present

## 2018-08-01 DIAGNOSIS — E782 Mixed hyperlipidemia: Secondary | ICD-10-CM | POA: Diagnosis not present

## 2018-08-01 DIAGNOSIS — I10 Essential (primary) hypertension: Secondary | ICD-10-CM | POA: Diagnosis not present

## 2018-08-04 DIAGNOSIS — I4891 Unspecified atrial fibrillation: Secondary | ICD-10-CM | POA: Diagnosis not present

## 2018-08-04 DIAGNOSIS — I350 Nonrheumatic aortic (valve) stenosis: Secondary | ICD-10-CM | POA: Diagnosis not present

## 2018-08-04 DIAGNOSIS — I10 Essential (primary) hypertension: Secondary | ICD-10-CM | POA: Diagnosis not present

## 2018-08-04 DIAGNOSIS — Z0000 Encounter for general adult medical examination without abnormal findings: Secondary | ICD-10-CM | POA: Diagnosis not present

## 2018-08-04 DIAGNOSIS — G4733 Obstructive sleep apnea (adult) (pediatric): Secondary | ICD-10-CM | POA: Diagnosis not present

## 2018-08-29 DIAGNOSIS — H26493 Other secondary cataract, bilateral: Secondary | ICD-10-CM | POA: Diagnosis not present

## 2018-08-29 DIAGNOSIS — H40003 Preglaucoma, unspecified, bilateral: Secondary | ICD-10-CM | POA: Diagnosis not present

## 2018-09-06 ENCOUNTER — Ambulatory Visit: Payer: Medicare Other | Admitting: Cardiology

## 2019-02-05 DIAGNOSIS — M154 Erosive (osteo)arthritis: Secondary | ICD-10-CM | POA: Diagnosis not present

## 2019-02-05 DIAGNOSIS — M064 Inflammatory polyarthropathy: Secondary | ICD-10-CM | POA: Diagnosis not present

## 2019-02-05 DIAGNOSIS — M2550 Pain in unspecified joint: Secondary | ICD-10-CM | POA: Diagnosis not present

## 2019-02-05 DIAGNOSIS — M150 Primary generalized (osteo)arthritis: Secondary | ICD-10-CM | POA: Diagnosis not present

## 2019-02-06 ENCOUNTER — Telehealth: Payer: Self-pay

## 2019-02-06 NOTE — Telephone Encounter (Signed)

## 2019-02-08 NOTE — Progress Notes (Signed)
Virtual Visit via Video Note   This visit type was conducted due to national recommendations for restrictions regarding the COVID-19 Pandemic (e.g. social distancing) in an effort to limit this patient's exposure and mitigate transmission in our community.  Due to her co-morbid illnesses, this patient is at least at moderate risk for complications without adequate follow up.  This format is felt to be most appropriate for this patient at this time.  All issues noted in this document were discussed and addressed.  A limited physical exam was performed with this format.  Please refer to the patient's chart for her consent to telehealth for [facility].  Evaluation Performed:  Follow-up visit  This visit type was conducted due to national recommendations for restrictions regarding the COVID-19 Pandemic (e.g. social distancing).  This format is felt to be most appropriate for this patient at this time.  All issues noted in this document were discussed and addressed.  No physical exam was performed (except for noted visual exam findings with Video Visits).  Please refer to the patient's chart (MyChart message for video visits and phone note for telephone visits) for the patient's consent to telehealth for [facility].  Date:  02/09/2019   ID:  [name], DOB 07-Apr-1936, MRN [id number]  Patient Location:  Home  Provider location:   [place]  PCP:  Maurice Small, MD  Cardiologist:  Fransico Him, MD  Electrophysiologist:  None   Chief Complaint:  CAD, aortic stenosis, HTN, chronic atrial fibrillation  History of Present Illness:    [name] is a 83 y.o. female who presents via audio/video conferencing for a telehealth visit today.    [name] is a 83 y.o. female with a PMH significant for chronic Afib (on xarelto), HTN, chronic diastolic heart failure, aortic stenosis, HLD, obesity, GERD, and breast cancer.  Left and right heart catheterization performed  11/19/2016 confirmed the presence of aortic stenosis with peak to peak and mean transvalvular gradients reported 29 and 21.7 mmHg respectively, corresponding to aortic valve area calculated 0.8 cm. The patient was noted to have 70% focal ostial stenosis of the first diagonal branch of the left anterior descending coronary artery, 30-40% stenosis of the proximal right coronary artery, and otherwise very mild nonobstructive coronary artery disease. There was mild pulmonary hypertension.  She underwent TAVR with 110mm Edwards Sapien 3 bioprosthesis 11/2016.    She is here today for followup and is doing well.  She denies any chest pain or pressure, SOB, DOE, PND, orthopnea, LE edema, dizziness, palpitations or syncope. She is compliant with her meds and is tolerating meds with no SE.     The patient does not have symptoms concerning for COVID-19 infection (fever, chills, cough, or new shortness of breath).    Prior CV studies:   The following studies were reviewed today:    Past Medical History:  Diagnosis Date  . Anginal pain ([place])   . Aortic stenosis    severe AS with moderate AR by echo with normal LVF  . Arthritis   . CAD in native artery, non obstructive cath 11/2016.  12/16/2016  . Cancer [facility])    left breast mastectomy(cancer)-surgery only  . Chronic diastolic congestive heart failure ([place])   . Complication of anesthesia    extremely claustophobic(only occurs with narcotic medications as stated per pt)  . Diverticulosis   . Fibromyalgia   . GERD (gastroesophageal reflux disease)   . Hard of hearing    wears hearing aides   .  History of breast cancer no recurrence   dx 2004   s/p  left breast mastectomy w/ node dissection/   no chemo or radiation  . History of TIA (transient ischemic attack)    per scan--  no residual  . Hyperlipidemia   . Hypertension   . Nocturia   . OSA on CPAP     severe/  AHI 34/hr  . Permanent atrial fibrillation    on Xarelto for CHADS2VASC score of 5  .  Pulmonary HTN ([place])    PASP 26mmHg by echo 11/2016  . Stroke ([place])    tia  . Urge urinary incontinence   . Wears glasses    Past Surgical History:  Procedure Laterality Date  . ABDOMINOPLASTY  1987  . APPENDECTOMY  1965  . BREAST SURGERY Left 2004   mastectomy   . CARDIAC CATHETERIZATION N/A 11/19/2016   Procedure: Right/Left Heart Cath and Coronary Angiography;  Surgeon: Troy Sine, MD;  Location: [place] [facility];  Service: Cardiovascular;  Laterality: N/A;  . CARPAL TUNNEL RELEASE Right 07-08-2004  . COLONOSCOPY WITH ESOPHAGOGASTRODUODENOSCOPY (EGD)  2006  . EXPLANTATION BREAST IMPLANT AND DEBRIDEMENT Left 09/15/2005  . INTERSTIM IMPLANT PLACEMENT N/A 08/22/2014   Procedure: Barrie Lyme IMPLANT STAGE ONE/TWO;  Surgeon: Reece Packer, MD;  Location: [facility];  Service: Urology;  Laterality: N/A;  . JOINT REPLACEMENT Left 2017   knee replacement  . MASTECTOMY    . MASTECTOMY, MODIFIED RADICAL W/RECONSTRUCTION  09-16-2003   left breast W/ SLN DISSECTION (pt states had 13 operations for implant infection   . REPLACE TISSUE EXPANDER LEFT BREAST AND TOTAL CAPSULECTOMY  08-05-2006  . REVISION BREAST RECONSTRUCTION Left 11-11-2004   12-03-2004  DRAINAGE SEREMA LEFT CHEST  . TEE WITHOUT CARDIOVERSION N/A 12/14/2016   Procedure: TRANSESOPHAGEAL ECHOCARDIOGRAM (TEE);  Surgeon: Burnell Blanks, MD;  Location: [place];  Service: Open Heart Surgery;  Laterality: N/A;  . TOTAL HIP ARTHROPLASTY Left 07/17/2013   Procedure: LEFT TOTAL HIP ARTHROPLASTY ANTERIOR APPROACH;  Surgeon: Mauri Pole, MD;  Location: [facility];  Service: Orthopedics;  Laterality: Left;  . TOTAL HIP ARTHROPLASTY Right 01/28/2015   Procedure: RIGHT TOTAL HIP ARTHROPLASTY ANTERIOR APPROACH;  Surgeon: Paralee Cancel, MD;  Location: [facility];  Service: Orthopedics;  Laterality: Right;  . TOTAL KNEE ARTHROPLASTY Left 11/11/2015   Procedure: LEFT TOTAL KNEE ARTHROPLASTY;  Surgeon: Paralee Cancel, MD;  Location:  [facility];  Service: Orthopedics;  Laterality: Left;  . TOTAL SHOULDER ARTHROPLASTY  08/11/2012   Procedure: TOTAL SHOULDER ARTHROPLASTY;  Surgeon: Augustin Schooling, MD;  Location: [place];  Service: Orthopedics;  Laterality: Right;  RIGHT  TOTAL SHOULDER  ARTHROPLASTY   . TRANSCATHETER AORTIC VALVE REPLACEMENT, TRANSFEMORAL N/A 12/14/2016   Procedure: TRANSCATHETER AORTIC VALVE REPLACEMENT, TRANSFEMORAL;  Surgeon: Burnell Blanks, MD;  Location: [place];  Service: Open Heart Surgery;  Laterality: N/A;  . TRANSTHORACIC ECHOCARDIOGRAM  02-25-2014   moderate LVH/  ef 55-60%/  mod. to sev. calcification AV with mild to moderate AV stenosis/  mild to mod. AR and MR/ mild PR/   moderate to severe LAE  . TUBAL LIGATION  1973     Current Meds  Medication Sig  . acetaminophen (TYLENOL) 325 MG tablet Take 650 mg by mouth 2 (two) times daily.  Marland Kitchen amoxicillin (AMOXIL) 500 MG capsule TAKE 4 CAPSULES BY MOUTH 1 HOUR BEFORE DENTAL APPT FOR PREMED  . atorvastatin (LIPITOR) 20 MG tablet Take 20 mg by mouth daily.  Marland Kitchen  diltiazem (TIAZAC) 180 MG 24 hr capsule Take 180 mg by mouth 2 (two) times daily.  . furosemide (LASIX) 20 MG tablet Take 20 mg by mouth as needed.  Marland Kitchen glucosamine-chondroitin 500-400 MG tablet Take 1 tablet by mouth 2 (two) times daily.  Marland Kitchen losartan (COZAAR) 50 MG tablet Take 50 mg by mouth daily.  . Melatonin 10 MG TABS Take 1 tablet by mouth as needed (for sleep).   . nitroGLYCERIN (NITROSTAT) 0.4 MG SL tablet Place 0.4 mg under the tongue every 5 (five) minutes as needed for chest pain. For chest pain   . omeprazole (PRILOSEC) 20 MG capsule Take 20 mg by mouth daily.   . polycarbophil (FIBER-CAPS) 625 MG tablet Take 625 mg by mouth daily.  . predniSONE (DELTASONE) 5 MG tablet Take 5 mg by mouth daily with breakfast.  . rivaroxaban (XARELTO) 20 MG TABS tablet Take 20 mg by mouth daily with supper.     Allergies:   Beta adrenergic blockers; Betapace [sotalol hcl]; Other; Oxycodone; Clonidine  derivatives; Crestor [rosuvastatin calcium]; Adhesive [tape]; Latex; and Tramadol   Social History   Tobacco Use  . Smoking status: Former Smoker    Packs/day: 0.25    Years: 8.00    Pack years: 2.00    Types: Cigarettes    Last attempt to quit: 10/18/1966    Years since quitting: 52.3  . Smokeless tobacco: Never Used  Substance Use Topics  . Alcohol use: No  . Drug use: No     Family Hx: The patient's family history includes Heart disease in her father and mother.  ROS:   Please see the history of present illness.     All other systems reviewed and are negative.   Labs/Other Tests and Data Reviewed:    Recent Labs: No results found for requested labs within last 8760 hours.   Recent Lipid Panel No results found for: CHOL, TRIG, HDL, CHOLHDL, LDLCALC, LDLDIRECT  Wt Readings from Last 3 Encounters:  02/09/19 185 lb (83.9 kg)  07/04/18 195 lb (88.5 kg)  02/06/18 196 lb (88.9 kg)     Objective:    Vital Signs:  BP 138/79   Pulse (!) 55   Ht 5\' 8"  (1.727 m)   Wt 185 lb (83.9 kg)   BMI 28.13 kg/m    CONSTITUTIONAL:  Well nourished, well developed female in no acute distress.  EYES: anicteric MOUTH: oral mucosa is pink RESPIRATORY: Normal respiratory effort, symmetric expansion CARDIOVASCULAR: No peripheral edema SKIN: No rash, lesions or ulcers MUSCULOSKELETAL: no digital cyanosis NEURO: Cranial Nerves II-XII grossly intact, moves all extremities PSYCH: Intact judgement and insight.  A&O x 3, Mood/affect appropriate   ASSESSMENT & PLAN:    1.  Aortic stenosis -  She is s/p TAVR with 17mm Edwards Sapien 3 bioprosthesis 11/2016.  She is doing well.  Her last 2D echo 01/2018 showed normal LVF with moderate LVH, stable TAVR with mean AVG 38mmHg.    2.  ASCAD - cath 11/2016 showed 70% focal ostial stenosis of the first diagonal branch of the left anterior descending coronary artery, 30-40% stenosis of the proximal right coronary artery, and otherwise very mild  nonobstructive coronary artery disease.  She denies any anginal symptoms.  She will continue on statin.  She is not on ASA due to DOAC.    3.  Chronic diastolic CHF - she has not had any SOB or LE edema and weight has been stable. She will continue on Lasix 20mg  PRN.  4.  Hyperlipidemia - Her LDL goal is < 70.  Her LDL was 94 last fall.  She will continue on atorvastatin 20mg  daily and I will check an FLP and ALT in June once the COVID crisis has improved.  5.  Hypertension - her BP is controlled at home.  She will continue on Diltiazem 180mg  BID and Losartan 50mg  daily.    6.  OSA - the patient is tolerating PAP therapy well without any problems.  Unfortunately her device can no longer get a download.  The patient has been using and benefiting from PAP use and will continue to benefit from therapy.  Her device is over 30 years old so I will order her a new ResMed CPAP set on auto from 4 to 18 cm H2O.  She will follow-up with [place] in 6 to 12 weeks for follow-up per insurance requirements to document compliance.  Of note she is planning to go [place] this summer so I told her we would try to get this in before they left.  7.  Chronic atrial fibrillation - her heart rate is well controlled.  She has not had any bleeding problems.  She will continue on Cardizem 180mg  daily and Xarelto 20mg  daily.  Her creatinine was 1.08 on 07/2018.  I will repeat a CBC and BMET in June due to the [place] 19 crisis.    8.  Pulmonary HTN - Echo 01/2018 showed moderate pulmonary HTN with a PASP of 46mmHg.  This is likely due to valvular heart disease.  55.  COVID-19 Education:The signs and symptoms of COVID-19 were discussed with the patient and how to seek care for testing (follow up with PCP or arrange E-visit).  The importance of social distancing was discussed today.  I also encouraged him to reconsider [place] testing summer as he did have a vaccine for COVID yet.  There is high risk given her age group as well as multiple  medical conditions including cardiac conditions.  If they do decide to go I recommended he wear a mask and gloves on a plane.  Patient Risk:   After full review of this patient's clinical status, I feel that they are at least moderate risk at this time.  Time:   Today, I have spent 15 minutes directly with the patient on Video discussing medical problems including aortic stenosis, pulmonary HTN, OSA, CHF and atrial fibrillation.  We also reviewed the symptoms of COVID 19 and the ways to protect against contracting the virus with telehealth technology.  I spent an additional 10 minutes reviewing patient's chart including 2D echo and cardiac cath.  Medication Adjustments/Labs and Tests Ordered: Current medicines are reviewed at length with the patient today.  Concerns regarding medicines are outlined above.  Tests Ordered: No orders of the defined types were placed in this encounter.  Medication Changes: No orders of the defined types were placed in this encounter.   Disposition:  Follow up in 6 month(s)  Signed, Fransico Him, MD  02/09/2019 8:03 AM    [facility]

## 2019-02-09 ENCOUNTER — Telehealth: Payer: Self-pay | Admitting: *Deleted

## 2019-02-09 ENCOUNTER — Encounter: Payer: Self-pay | Admitting: Cardiology

## 2019-02-09 ENCOUNTER — Telehealth (INDEPENDENT_AMBULATORY_CARE_PROVIDER_SITE_OTHER): Payer: Medicare Other | Admitting: Cardiology

## 2019-02-09 ENCOUNTER — Other Ambulatory Visit: Payer: Self-pay

## 2019-02-09 VITALS — BP 138/79 | HR 55 | Ht 68.0 in | Wt 185.0 lb

## 2019-02-09 DIAGNOSIS — I272 Pulmonary hypertension, unspecified: Secondary | ICD-10-CM | POA: Diagnosis not present

## 2019-02-09 DIAGNOSIS — Z7189 Other specified counseling: Secondary | ICD-10-CM

## 2019-02-09 DIAGNOSIS — I2510 Atherosclerotic heart disease of native coronary artery without angina pectoris: Secondary | ICD-10-CM | POA: Diagnosis not present

## 2019-02-09 DIAGNOSIS — G4733 Obstructive sleep apnea (adult) (pediatric): Secondary | ICD-10-CM

## 2019-02-09 DIAGNOSIS — I5032 Chronic diastolic (congestive) heart failure: Secondary | ICD-10-CM

## 2019-02-09 DIAGNOSIS — I350 Nonrheumatic aortic (valve) stenosis: Secondary | ICD-10-CM | POA: Diagnosis not present

## 2019-02-09 DIAGNOSIS — E7800 Pure hypercholesterolemia, unspecified: Secondary | ICD-10-CM | POA: Diagnosis not present

## 2019-02-09 DIAGNOSIS — I10 Essential (primary) hypertension: Secondary | ICD-10-CM | POA: Diagnosis not present

## 2019-02-09 DIAGNOSIS — I4821 Permanent atrial fibrillation: Secondary | ICD-10-CM | POA: Diagnosis not present

## 2019-02-09 NOTE — Patient Instructions (Addendum)
Medication Instructions:  Your physician recommends that you continue on your current medications as directed. Please refer to the Current Medication list given to you today.  If you need a refill on your cardiac medications before your next appointment, please call your pharmacy.   Lab work: Fasting Labs: BMET, CBC, Lipid and Liver, please call the office to schedule this around June  If you have labs (blood work) drawn today and your tests are completely normal, you will receive your results only by: Marland Kitchen MyChart Message (if you have MyChart) OR . A paper copy in the mail If you have any lab test that is abnormal or we need to change your treatment, we will call you to review the results.  Testing/Procedures: None  Follow-Up: At [facility], you and your health needs are our priority.  As part of our continuing mission to provide you with exceptional heart care, we have created designated Provider Care Teams.  These Care Teams include your primary Cardiologist (physician) and Advanced Practice Providers (APPs -  Physician Assistants and Nurse Practitioners) who all work together to provide you with the care you need, when you need it.  Call our sleep coordinator once you have received your CPAP device and she will schedule a 6 week follow up visit.    You will need a follow up appointment in 6 months.  Please call our office 2 months in advance to schedule this appointment.  You may see Fransico Him, MD or one of the following Advanced Practice Providers on your designated Care Team:   [place], PA-C Melina Copa, PA-C . Ermalinda Barrios, PA-C

## 2019-02-09 NOTE — Telephone Encounter (Signed)
Order placed to [facility] via community message. 6 week follow up pending cpap set up

## 2019-02-09 NOTE — Telephone Encounter (Signed)
-----   Message from Sarina Ill, RN sent at 02/09/2019  9:14 AM EDT ----- Dr. Radford Pax ordered a new CPAP, patient needs a 6 week follow up once she gets the device. Thanks, [employer]

## 2019-02-14 DIAGNOSIS — D225 Melanocytic nevi of trunk: Secondary | ICD-10-CM | POA: Diagnosis not present

## 2019-02-14 DIAGNOSIS — D2262 Melanocytic nevi of left upper limb, including shoulder: Secondary | ICD-10-CM | POA: Diagnosis not present

## 2019-02-14 DIAGNOSIS — L821 Other seborrheic keratosis: Secondary | ICD-10-CM | POA: Diagnosis not present

## 2019-02-14 DIAGNOSIS — I350 Nonrheumatic aortic (valve) stenosis: Secondary | ICD-10-CM | POA: Diagnosis not present

## 2019-02-14 DIAGNOSIS — I2510 Atherosclerotic heart disease of native coronary artery without angina pectoris: Secondary | ICD-10-CM | POA: Diagnosis not present

## 2019-02-14 DIAGNOSIS — I10 Essential (primary) hypertension: Secondary | ICD-10-CM | POA: Diagnosis not present

## 2019-02-14 DIAGNOSIS — I4891 Unspecified atrial fibrillation: Secondary | ICD-10-CM | POA: Diagnosis not present

## 2019-02-14 DIAGNOSIS — L814 Other melanin hyperpigmentation: Secondary | ICD-10-CM | POA: Diagnosis not present

## 2019-02-14 DIAGNOSIS — G4733 Obstructive sleep apnea (adult) (pediatric): Secondary | ICD-10-CM | POA: Diagnosis not present

## 2019-02-14 DIAGNOSIS — D692 Other nonthrombocytopenic purpura: Secondary | ICD-10-CM | POA: Diagnosis not present

## 2019-02-14 DIAGNOSIS — L570 Actinic keratosis: Secondary | ICD-10-CM | POA: Diagnosis not present

## 2019-03-07 NOTE — Telephone Encounter (Signed)
Spoke with the patient, she stated she has not been feeling great since being on her new CPAP. She is not in [employer], sent a message to sleep coordinator.

## 2019-03-09 ENCOUNTER — Telehealth: Payer: Self-pay | Admitting: *Deleted

## 2019-03-09 NOTE — Telephone Encounter (Signed)
-----   Message from Sueanne Margarita, MD sent at 03/03/2019 11:51 PM EDT ----- AHI is too high - please find out what mask she is wearing and if it is leaking. Find out when she changed out the cushion and if she sleeps supine

## 2019-03-09 NOTE — Telephone Encounter (Signed)
Patient is aware and agreeable to AHI being elevated at 14.5. Patient is wearing a nose pillow mask. Patient does not think he machine leaks. Patients cushions are more than 70 days old. She has been washing her old ones and re-using them b/c she needs small and she has extra small ones.Patient was encouraged to call her dme for new supplies and if she needs a new prescription we can get that for her. Yes, patients always sleeps on her back.

## 2019-03-16 ENCOUNTER — Telehealth: Payer: Self-pay | Admitting: *Deleted

## 2019-03-16 NOTE — Telephone Encounter (Signed)
-----   Message from Sueanne Margarita, MD sent at 03/13/2019 10:28 PM EDT ----- Please find out if this download is on her new machine.  Her AHI is high and appears to also have a mask leak.  Please find out what mask she is wearing as well and if her mask is leaking.

## 2019-03-16 NOTE — Telephone Encounter (Signed)
Spoke to patient and  Yes download was with the new machine, she has had her new machine for 6 weeks. Patient has the nasal pillows Yes her mask is leaking

## 2019-03-20 DIAGNOSIS — G4733 Obstructive sleep apnea (adult) (pediatric): Secondary | ICD-10-CM | POA: Diagnosis not present

## 2019-03-20 DIAGNOSIS — E782 Mixed hyperlipidemia: Secondary | ICD-10-CM | POA: Diagnosis not present

## 2019-03-20 DIAGNOSIS — Z1211 Encounter for screening for malignant neoplasm of colon: Secondary | ICD-10-CM | POA: Diagnosis not present

## 2019-03-20 DIAGNOSIS — I350 Nonrheumatic aortic (valve) stenosis: Secondary | ICD-10-CM | POA: Diagnosis not present

## 2019-03-20 DIAGNOSIS — N183 Chronic kidney disease, stage 3 (moderate): Secondary | ICD-10-CM | POA: Diagnosis not present

## 2019-03-20 DIAGNOSIS — I10 Essential (primary) hypertension: Secondary | ICD-10-CM | POA: Diagnosis not present

## 2019-03-20 DIAGNOSIS — Z952 Presence of prosthetic heart valve: Secondary | ICD-10-CM | POA: Diagnosis not present

## 2019-03-20 DIAGNOSIS — I4891 Unspecified atrial fibrillation: Secondary | ICD-10-CM | POA: Diagnosis not present

## 2019-03-20 DIAGNOSIS — Z0000 Encounter for general adult medical examination without abnormal findings: Secondary | ICD-10-CM | POA: Diagnosis not present

## 2019-03-21 NOTE — Telephone Encounter (Signed)
Patient states her nasal pillows came and were extra small and she could not wear these and sent those back and has not heard from [facility] about the replacement ones. I reached out to [facility] spoke to [employer] who states she will call the patient and help her with what she needs.

## 2019-03-26 ENCOUNTER — Telehealth: Payer: Self-pay

## 2019-03-26 NOTE — Telephone Encounter (Signed)
    COVID-19 Pre-Screening Questions:  . In the past 7 to 10 days have you had a cough,  shortness of breath, headache, congestion, fever (100 or greater) body aches, chills, sore throat, or sudden loss of taste or sense of smell? . Have you been around anyone with known Covid 19. . Have you been around anyone who is awaiting Covid 19 test results in the past 7 to 10 days? . Have you been around anyone who has been exposed to Covid 19, or has mentioned symptoms of Covid 19 within the past 7 to 10 days?  If you have any concerns/questions about symptoms patients report during screening (either on the phone or at threshold). Contact the provider seeing the patient or DOD for further guidance.  If neither are available contact a member of the leadership team.          NO answer left message for lab [phone] 03/26/2019

## 2019-03-27 ENCOUNTER — Other Ambulatory Visit: Payer: Self-pay

## 2019-03-27 ENCOUNTER — Other Ambulatory Visit: Payer: Medicare Other

## 2019-03-27 DIAGNOSIS — I4821 Permanent atrial fibrillation: Secondary | ICD-10-CM

## 2019-03-27 DIAGNOSIS — E7800 Pure hypercholesterolemia, unspecified: Secondary | ICD-10-CM | POA: Diagnosis not present

## 2019-03-27 LAB — LIPID PANEL
Chol/HDL Ratio: 3.1 ratio (ref 0.0–4.4)
Cholesterol, Total: 186 mg/dL (ref 100–199)
HDL: 60 mg/dL (ref >39)
LDL Calculated: 90 mg/dL (ref 0–99)
Triglycerides: 180 mg/dL — ABNORMAL HIGH (ref 0–149)
VLDL Cholesterol Cal: 36 mg/dL (ref 5–40)

## 2019-03-27 LAB — CBC
Hematocrit: 45.4 % (ref 34.0–46.6)
Hemoglobin: 15.1 g/dL (ref 11.1–15.9)
MCH: 31.9 pg (ref 26.6–33.0)
MCHC: 33.3 g/dL (ref 31.5–35.7)
MCV: 96 fL (ref 79–97)
Platelets: 212 10*3/uL (ref 150–450)
RBC: 4.73 x10E6/uL (ref 3.77–5.28)
RDW: 14.3 % (ref 11.7–15.4)
WBC: 11.2 10*3/uL — ABNORMAL HIGH (ref 3.4–10.8)

## 2019-03-27 LAB — BASIC METABOLIC PANEL
BUN/Creatinine Ratio: 21 (ref 12–28)
BUN: 21 mg/dL (ref 8–27)
CO2: 23 mmol/L (ref 20–29)
Calcium: 10.4 mg/dL — ABNORMAL HIGH (ref 8.7–10.3)
Chloride: 102 mmol/L (ref 96–106)
Creatinine, Ser: 1 mg/dL (ref 0.57–1.00)
GFR calc Af Amer: 61 mL/min/{1.73_m2} (ref >59)
GFR calc non Af Amer: 53 mL/min/{1.73_m2} — ABNORMAL LOW (ref >59)
Glucose: 103 mg/dL — ABNORMAL HIGH (ref 65–99)
Potassium: 4.2 mmol/L (ref 3.5–5.2)
Sodium: 144 mmol/L (ref 134–144)

## 2019-03-27 LAB — HEPATIC FUNCTION PANEL
ALT: 19 IU/L (ref 0–32)
AST: 22 IU/L (ref 0–40)
Albumin: 4.8 g/dL — ABNORMAL HIGH (ref 3.6–4.6)
Alkaline Phosphatase: 86 IU/L (ref 39–117)
Bilirubin Total: 0.7 mg/dL (ref 0.0–1.2)
Bilirubin, Direct: 0.23 mg/dL (ref 0.00–0.40)
Total Protein: 7.2 g/dL (ref 6.0–8.5)

## 2019-03-29 ENCOUNTER — Telehealth: Payer: Self-pay

## 2019-03-29 DIAGNOSIS — E7800 Pure hypercholesterolemia, unspecified: Secondary | ICD-10-CM

## 2019-03-29 MED ORDER — ATORVASTATIN CALCIUM 40 MG PO TABS: 40.0000 mg | ORAL_TABLET | Freq: Every day | ORAL | 3 refills | Status: DC

## 2019-03-29 NOTE — Telephone Encounter (Signed)
Spoke with the patient, she expressed understanding about increasing atorvastatin to 40 mg, daily and she will have fasting labs on 05/14/19.

## 2019-03-29 NOTE — Telephone Encounter (Signed)
-----   Message from Sueanne Margarita, MD sent at 03/28/2019  7:00 PM EDT ----- Calcium mildly elevated as well as CBC.  Forward to PCP for review.  LDL is not at goal  Please increase atorvastatin to 40mg  daily and repeat FLP and ALt in 6 weeks

## 2019-04-03 DIAGNOSIS — Z1231 Encounter for screening mammogram for malignant neoplasm of breast: Secondary | ICD-10-CM | POA: Diagnosis not present

## 2019-04-10 DIAGNOSIS — R928 Other abnormal and inconclusive findings on diagnostic imaging of breast: Secondary | ICD-10-CM | POA: Diagnosis not present

## 2019-04-10 DIAGNOSIS — Z853 Personal history of malignant neoplasm of breast: Secondary | ICD-10-CM | POA: Diagnosis not present

## 2019-04-10 DIAGNOSIS — N6489 Other specified disorders of breast: Secondary | ICD-10-CM | POA: Diagnosis not present

## 2019-04-23 ENCOUNTER — Telehealth: Payer: Self-pay | Admitting: *Deleted

## 2019-04-23 NOTE — Telephone Encounter (Signed)
[name] has changed the pressure to auto 6-18 for the patient. The appointment for 7/24 has been cancelled. Dr Radford Pax will make changes and see how she does with those changes.

## 2019-04-23 NOTE — Telephone Encounter (Signed)
[facility] says her humidity is set on 5. In 2014 she was set on 7cm. [facility] says her EPR is on 2 and can be changed to 3 if she has problems breathing out. I tried to make her a virtual visit but she insist on coming in to see you and bringing her cpap. Her appointment is on 7/24 at 9 am.

## 2019-04-23 NOTE — Telephone Encounter (Signed)
-----   Message from Sueanne Margarita, MD sent at 04/10/2019  4:31 PM EDT ----- Regarding: RE: PRESSURE Please call her DME to ask if she has ever been on 5cm.  I am happy to do a virtual visit with her  Traci ----- Message ----- From: Freada Bergeron, CMA Sent: 04/09/2019   3:33 PM EDT To: Sueanne Margarita, MD Subject: RE: PRESSURE                                   Patient states she has never had a pressure of 7cm H20. She wants an office visit to come in and talk with you. ----- Message ----- From: Sueanne Margarita, MD Sent: 04/08/2019   8:27 PM EDT To: Freada Bergeron, CMA Subject: RE: PRESSURE                                   Please let her know that I have reviewed her settings as far back as 2015 and she has not been on 5cm H2O.  She has been on 7cm H2O in the past but her OSA has gotten worse requiring a higher pressure.  I can set her at 5cm H2O but it will not do any good for her because her 2 week auotitration shows that she needs to be on at least 12cmH2O.  If not treated adequately she will likely have more problems with PAF  Fransico Him ----- Message ----- From: Freada Bergeron, CMA Sent: 04/06/2019   5:26 PM EDT To: Sueanne Margarita, MD Subject: PRESSURE                                       I called to see if she had gotten set up with cpap and the patient says she will not wear her new cpap until it is set on 5 cm because that is what her old unit is on and this is comfortable for her. She will continue to wear her old one.  Thanks, Gae Bon

## 2019-04-24 ENCOUNTER — Telehealth: Payer: Self-pay | Admitting: *Deleted

## 2019-04-24 DIAGNOSIS — G4733 Obstructive sleep apnea (adult) (pediatric): Secondary | ICD-10-CM

## 2019-04-24 NOTE — Telephone Encounter (Signed)
Order placed today to [facility].

## 2019-04-24 NOTE — Telephone Encounter (Signed)
-----   Message from Sueanne Margarita, MD sent at 04/24/2019  4:05 PM EDT ----- Regarding: RE: write order Change CPAP to auto from 6 to 18cm H2O and get a download in 2 weeks  Traci ----- Message ----- From: Freada Bergeron, CMA Sent: 04/24/2019  12:57 PM EDT To: Sueanne Margarita, MD Subject: write order                                     Nevin Bloodgood called the patient and she has changed the pressure to auto 6-18.  Nevin Bloodgood needs the prescription sent for the pressure change please. She already changed it.

## 2019-05-03 DIAGNOSIS — N3946 Mixed incontinence: Secondary | ICD-10-CM | POA: Diagnosis not present

## 2019-05-03 DIAGNOSIS — N390 Urinary tract infection, site not specified: Secondary | ICD-10-CM | POA: Diagnosis not present

## 2019-05-03 DIAGNOSIS — R351 Nocturia: Secondary | ICD-10-CM | POA: Diagnosis not present

## 2019-05-03 DIAGNOSIS — R350 Frequency of micturition: Secondary | ICD-10-CM | POA: Diagnosis not present

## 2019-05-04 ENCOUNTER — Telehealth: Payer: Self-pay | Admitting: Cardiology

## 2019-05-04 NOTE — Telephone Encounter (Signed)
Can we please clarify what the procedure is?     Patient with diagnosis of afib on Xarelto for anticoagulation.    Procedure: Inter stem Rmoval  Date of procedure: 06/15/2019  CHADS2-VASc score of  8 (CHF, HTN, AGE, stroke/tia x 2, CAD, AGE, female)

## 2019-05-04 NOTE — Telephone Encounter (Addendum)
Left message with pharmacist updated recommendation to only hold Xarelto one day pre op. I will also send a message to the operating surgeon.  Kerin Ransom PA-C 05/04/2019 4:54 PM

## 2019-05-04 NOTE — Telephone Encounter (Signed)
.  ° °  [facility] Pre-operative Risk Assessment    Request for surgical clearance:  1. What type of surgery is being performed? Inter stem Rmoval   2. When is this surgery scheduled?  06-15-19   3. What type of clearance is required (medical clearance vs. Pharmacy clearance to hold med vs. Both)? Both  4. Are there any medications that need to be held prior to surgery and how long? Xarelto   5. Practice name and name off Physician performing surgery?  Dr Nicki Reaper MacDiarmid 6. What is your office phone number [phone]    7.   What is your office fax number [phone]  8.   Anesthesia type (None, local, MAC, general) ?MAC   [name] 05/04/2019, 1:38 PM  _________________________________________________________________   (provider comments below)

## 2019-05-04 NOTE — Telephone Encounter (Signed)
The following is from uptodate: SBE prophylaxis is only require for high risk procedures. The following are the highest risk procedures:  .Dental procedures that involve manipulation of either gingival tissue or the periapical region of teeth or perforation of the oral mucosa; this includes routine dental cleaning. .Procedures of the respiratory tract that involve incision or biopsy of the respiratory mucosa .Gastrointestinal (GI) or genitourinary (GU) procedures in patients with ongoing GI or GU tract infection .Procedures on infected skin, skin structure, or musculoskeletal tissue .Surgery to place prosthetic heart valves or prosthetic intravascular or intracardiac materials  So they would only require SBE prophylaxis for a GU procedure in the setting of on an ongoing GU infection.    Hope that helps,  KT

## 2019-05-04 NOTE — Telephone Encounter (Signed)
   Primary Cardiologist: Fransico Him, MD  Chart reviewed and patient contacted today by phone as part of pre-operative protocol coverage. Given past medical history and time since last visit, based on ACC/AHA guidelines, [name] would be at acceptable risk for the planned procedure without further cardiovascular testing.   OK to hold Xarelto 1-2 days pre op if needed. She does not currently have an ongoing urinary tract infection so based on SBE guidelines she will not require SBE prophylaxis.   I will route this recommendation to the requesting party via Epic fax function and remove from pre-op pool.  Please call with questions.  Kerin Ransom, PA-C 05/04/2019, 3:41 PM

## 2019-05-04 NOTE — Telephone Encounter (Signed)
Did not previously provide clearance. I would recommend holding only 1 day due to history of stroke.

## 2019-05-04 NOTE — Telephone Encounter (Signed)
Thanks!  LK

## 2019-05-04 NOTE — Telephone Encounter (Signed)
Valetta Fuller its my understanding that TAVR pt's require SBE prophylaxis before urologic procedures correct ?  Kerin Ransom PA-C 05/04/2019 3:07 PM

## 2019-05-07 DIAGNOSIS — H26493 Other secondary cataract, bilateral: Secondary | ICD-10-CM | POA: Diagnosis not present

## 2019-05-11 ENCOUNTER — Ambulatory Visit: Payer: Medicare Other | Admitting: Cardiology

## 2019-05-11 ENCOUNTER — Telehealth: Payer: Medicare Other | Admitting: Cardiology

## 2019-05-14 ENCOUNTER — Other Ambulatory Visit: Payer: Self-pay

## 2019-05-14 ENCOUNTER — Other Ambulatory Visit: Payer: Medicare Other | Admitting: *Deleted

## 2019-05-14 DIAGNOSIS — E7800 Pure hypercholesterolemia, unspecified: Secondary | ICD-10-CM

## 2019-05-14 LAB — HEPATIC FUNCTION PANEL
ALT: 19 IU/L (ref 0–32)
AST: 27 IU/L (ref 0–40)
Albumin: 4.5 g/dL (ref 3.6–4.6)
Alkaline Phosphatase: 84 IU/L (ref 39–117)
Bilirubin Total: 0.7 mg/dL (ref 0.0–1.2)
Bilirubin, Direct: 0.19 mg/dL (ref 0.00–0.40)
Total Protein: 6.7 g/dL (ref 6.0–8.5)

## 2019-05-14 LAB — LIPID PANEL
Chol/HDL Ratio: 2.8 ratio (ref 0.0–4.4)
Cholesterol, Total: 193 mg/dL (ref 100–199)
HDL: 68 mg/dL (ref >39)
LDL Calculated: 91 mg/dL (ref 0–99)
Triglycerides: 172 mg/dL — ABNORMAL HIGH (ref 0–149)
VLDL Cholesterol Cal: 34 mg/dL (ref 5–40)

## 2019-05-16 ENCOUNTER — Telehealth: Payer: Self-pay

## 2019-05-16 DIAGNOSIS — E785 Hyperlipidemia, unspecified: Secondary | ICD-10-CM

## 2019-05-16 MED ORDER — ATORVASTATIN CALCIUM 80 MG PO TABS: 80.0000 mg | ORAL_TABLET | Freq: Every day | ORAL | 3 refills | Status: DC

## 2019-05-16 NOTE — Telephone Encounter (Signed)
-----   Message from Sueanne Margarita, MD sent at 05/15/2019 10:27 AM EDT ----- LDL not at goal - increase atorvastatin to 80mg  daily and repeat FLp and ALT in 6 weeks

## 2019-05-16 NOTE — Telephone Encounter (Signed)
Notes recorded by Frederik Schmidt, RN on 05/16/2019 at 10:38 AM EDT  The patient has been notified of the result and verbalized understanding. All questions (if any) were answered.  Frederik Schmidt, RN 05/16/2019 10:38 AM

## 2019-06-27 ENCOUNTER — Other Ambulatory Visit: Payer: Self-pay

## 2019-06-27 ENCOUNTER — Other Ambulatory Visit: Payer: Medicare Other | Admitting: *Deleted

## 2019-06-27 DIAGNOSIS — E785 Hyperlipidemia, unspecified: Secondary | ICD-10-CM | POA: Diagnosis not present

## 2019-06-27 LAB — LIPID PANEL
Chol/HDL Ratio: 2.6 ratio (ref 0.0–4.4)
Cholesterol, Total: 171 mg/dL (ref 100–199)
HDL: 67 mg/dL (ref >39)
LDL Chol Calc (NIH): 78 mg/dL (ref 0–99)
Triglycerides: 155 mg/dL — ABNORMAL HIGH (ref 0–149)
VLDL Cholesterol Cal: 26 mg/dL (ref 5–40)

## 2019-06-27 LAB — ALT: ALT: 27 IU/L (ref 0–32)

## 2019-06-28 ENCOUNTER — Telehealth: Payer: Self-pay

## 2019-06-28 DIAGNOSIS — E785 Hyperlipidemia, unspecified: Secondary | ICD-10-CM

## 2019-06-28 MED ORDER — EZETIMIBE 10 MG PO TABS: 10.0000 mg | ORAL_TABLET | Freq: Every day | ORAL | 3 refills | Status: DC

## 2019-06-28 NOTE — Telephone Encounter (Signed)
-----   Message from Sueanne Margarita, MD sent at 06/27/2019  5:27 PM EDT ----- LDL not at goal - add Zetia 10mg  daily and repeat FLP and ALT in 8 weeks

## 2019-06-28 NOTE — Telephone Encounter (Signed)
Notes recorded by Frederik Schmidt, RN on 06/28/2019 at 8:33 AM EDT  The patient has been notified of the result and verbalized understanding. All questions (if any) were answered.  Frederik Schmidt, RN 06/28/2019 8:32 AM

## 2019-07-06 DIAGNOSIS — Z23 Encounter for immunization: Secondary | ICD-10-CM | POA: Diagnosis not present

## 2019-07-11 DIAGNOSIS — T85193A Other mechanical complication of implanted electronic neurostimulator, generator, initial encounter: Secondary | ICD-10-CM | POA: Diagnosis not present

## 2019-07-11 DIAGNOSIS — N3941 Urge incontinence: Secondary | ICD-10-CM | POA: Diagnosis not present

## 2019-07-11 DIAGNOSIS — T85191A Other mechanical complication of implanted electronic neurostimulator (electrode) of peripheral nerve, initial encounter: Secondary | ICD-10-CM | POA: Diagnosis not present

## 2019-07-17 DIAGNOSIS — M25512 Pain in left shoulder: Secondary | ICD-10-CM | POA: Diagnosis not present

## 2019-07-17 DIAGNOSIS — M19012 Primary osteoarthritis, left shoulder: Secondary | ICD-10-CM | POA: Diagnosis not present

## 2019-07-19 DIAGNOSIS — N3946 Mixed incontinence: Secondary | ICD-10-CM | POA: Diagnosis not present

## 2019-08-01 DIAGNOSIS — I4891 Unspecified atrial fibrillation: Secondary | ICD-10-CM | POA: Diagnosis not present

## 2019-08-01 DIAGNOSIS — Z4802 Encounter for removal of sutures: Secondary | ICD-10-CM | POA: Diagnosis not present

## 2019-08-03 ENCOUNTER — Telehealth: Payer: Self-pay | Admitting: *Deleted

## 2019-08-03 NOTE — Telephone Encounter (Signed)
download [name], CMA  [name], CMA        6 week f/u after cpap set up

## 2019-08-03 NOTE — Telephone Encounter (Signed)
download Freada Bergeron, CMA  Freada Bergeron, CMA        6 week f/u after cpap set up    Patient got set up with new cpap unit but she is not using her new cpap unit. The last download was on 07/15/19.  Tried to get a download on patient's cpap but patient states she is not using her new cpap but her old one because she is more comfortable with her old one. Patient states she may turn the new cpap back in to [facility]. Patient states she will talk with Dr Radford Pax at her next office visit about it.

## 2019-08-07 ENCOUNTER — Telehealth: Payer: Self-pay | Admitting: *Deleted

## 2019-08-07 NOTE — Telephone Encounter (Signed)

## 2019-08-08 ENCOUNTER — Other Ambulatory Visit: Payer: Self-pay

## 2019-08-08 ENCOUNTER — Telehealth (INDEPENDENT_AMBULATORY_CARE_PROVIDER_SITE_OTHER): Payer: Medicare Other | Admitting: Cardiology

## 2019-08-08 ENCOUNTER — Encounter: Payer: Self-pay | Admitting: Cardiology

## 2019-08-08 VITALS — BP 159/89 | HR 75 | Ht 66.0 in | Wt 191.0 lb

## 2019-08-08 DIAGNOSIS — I350 Nonrheumatic aortic (valve) stenosis: Secondary | ICD-10-CM

## 2019-08-08 DIAGNOSIS — Z6830 Body mass index (BMI) 30.0-30.9, adult: Secondary | ICD-10-CM | POA: Diagnosis not present

## 2019-08-08 DIAGNOSIS — E669 Obesity, unspecified: Secondary | ICD-10-CM | POA: Diagnosis not present

## 2019-08-08 DIAGNOSIS — Z952 Presence of prosthetic heart valve: Secondary | ICD-10-CM

## 2019-08-08 DIAGNOSIS — E7800 Pure hypercholesterolemia, unspecified: Secondary | ICD-10-CM

## 2019-08-08 DIAGNOSIS — M154 Erosive (osteo)arthritis: Secondary | ICD-10-CM | POA: Diagnosis not present

## 2019-08-08 DIAGNOSIS — I2510 Atherosclerotic heart disease of native coronary artery without angina pectoris: Secondary | ICD-10-CM

## 2019-08-08 DIAGNOSIS — G4733 Obstructive sleep apnea (adult) (pediatric): Secondary | ICD-10-CM

## 2019-08-08 DIAGNOSIS — Z7952 Long term (current) use of systemic steroids: Secondary | ICD-10-CM | POA: Diagnosis not present

## 2019-08-08 DIAGNOSIS — I5032 Chronic diastolic (congestive) heart failure: Secondary | ICD-10-CM

## 2019-08-08 DIAGNOSIS — I4821 Permanent atrial fibrillation: Secondary | ICD-10-CM

## 2019-08-08 DIAGNOSIS — I10 Essential (primary) hypertension: Secondary | ICD-10-CM | POA: Diagnosis not present

## 2019-08-08 DIAGNOSIS — M064 Inflammatory polyarthropathy: Secondary | ICD-10-CM | POA: Diagnosis not present

## 2019-08-08 DIAGNOSIS — M2550 Pain in unspecified joint: Secondary | ICD-10-CM | POA: Diagnosis not present

## 2019-08-08 DIAGNOSIS — I272 Pulmonary hypertension, unspecified: Secondary | ICD-10-CM | POA: Diagnosis not present

## 2019-08-08 DIAGNOSIS — M150 Primary generalized (osteo)arthritis: Secondary | ICD-10-CM | POA: Diagnosis not present

## 2019-08-08 MED ORDER — LOSARTAN POTASSIUM 100 MG PO TABS: 100.0000 mg | ORAL_TABLET | Freq: Every day | ORAL | 3 refills | Status: DC

## 2019-08-08 NOTE — Progress Notes (Signed)
Virtual Visit via Telephone Note   This visit type was conducted due to national recommendations for restrictions regarding the COVID-19 Pandemic (e.g. social distancing) in an effort to limit this patient's exposure and mitigate transmission in our community.  Due to her co-morbid illnesses, this patient is at least at moderate risk for complications without adequate follow up.  This format is felt to be most appropriate for this patient at this time.  The patient did not have access to video technology/had technical difficulties with video requiring transitioning to audio format only (telephone).  All issues noted in this document were discussed and addressed.  No physical exam could be performed with this format.  Please refer to the patient's chart for her  consent to telehealth for [facility].   Evaluation Performed:  Follow-up visit  This visit type was conducted due to national recommendations for restrictions regarding the COVID-19 Pandemic (e.g. social distancing).  This format is felt to be most appropriate for this patient at this time.  All issues noted in this document were discussed and addressed.  No physical exam was performed (except for noted visual exam findings with Video Visits).  Please refer to the patient's chart (MyChart message for video visits and phone note for telephone visits) for the patient's consent to telehealth for [facility].  Date:  08/08/2019   ID:  [name], DOB February 01, 1936, MRN [id number]  Patient Location:  Home  Provider location:   [place]  PCP:  Jacqueline Small, MD  Cardiologist:  Jacqueline Him, MD  Electrophysiologist:  None   Chief Complaint:  CAD, aortic stenosis, HTN, chronic afib  History of Present Illness:    [name] is a 83 y.o. female who presents via audio/video conferencing for a telehealth visit today.    Jacqueline Molyneux Langervikis a 83 y.o.femalewith a PMH significant for chronic Afib (on xarelto), HTN,  chronic diastolic heart failure, aortic stenosis, HLD, obesity, GERD, and breast cancer.  She has a hx of CAD with cath in 2018 showing 70% focal ostial stenosis of the first diagonal branch of the left anterior descending coronary artery, 30-40% stenosis of the proximal right coronary artery, and otherwise very mild nonobstructive coronary artery disease. Medical management was recommended.  There was mild pulmonary hypertension.  She underwent TAVR with 3mm Edwards Sapien 3 bioprosthesis 11/2016.  She also has OSA and is on CPAP.    She is here today for followup and is doing well.  She denies any chest pain or pressure, SOB, DOE (except with running up and down the stairs and is stable), PND, orthopnea, LE edema, dizziness, palpitations or syncope. She is compliant with her meds and is tolerating meds with no SE.  She is doing well with her PAP.  She tolerates the mask and feels the pressure is adequate.  Since going on CPAP she feels rested in the am and has no significant daytime sleepiness.  She denies any significant mouth or nasal dryness or nasal congestion.  She does not think that he snores.    The patient does not have symptoms concerning for COVID-19 infection (fever, chills, cough, or new shortness of breath).   Prior CV studies:   The following studies were reviewed today:  PAP compliance download  Past Medical History:  Diagnosis Date  . Anginal pain ([place])   . Aortic stenosis    severe AS with moderate AR by echo with normal LVF  . Arthritis   . CAD in native artery,  non obstructive cath 11/2016.  12/16/2016  . Cancer [facility])    left breast mastectomy(cancer)-surgery only  . Chronic diastolic congestive heart failure ([place])   . Complication of anesthesia    extremely claustophobic(only occurs with narcotic medications as stated per pt)  . Diverticulosis   . Fibromyalgia   . GERD (gastroesophageal reflux disease)   . Hard of hearing    wears hearing aides   . History of breast  cancer no recurrence   dx 2004   s/p  left breast mastectomy w/ node dissection/   no chemo or radiation  . History of TIA (transient ischemic attack)    per scan--  no residual  . Hyperlipidemia   . Hypertension   . Nocturia   . OSA on CPAP     severe/  AHI 34/hr  . Permanent atrial fibrillation (H[facility])    on Xarelto for CHADS2VASC score of 5  . Pulmonary HTN ([place])    PASP 78mmHg by echo 11/2016  . Stroke ([place])    tia  . Urge urinary incontinence   . Wears glasses    Past Surgical History:  Procedure Laterality Date  . ABDOMINOPLASTY  1987  . APPENDECTOMY  1965  . BREAST SURGERY Left 2004   mastectomy   . CARDIAC CATHETERIZATION N/A 11/19/2016   Procedure: Right/Left Heart Cath and Coronary Angiography;  Surgeon: Troy Sine, MD;  Location: [place] [facility];  Service: Cardiovascular;  Laterality: N/A;  . CARPAL TUNNEL RELEASE Right 07-08-2004  . COLONOSCOPY WITH ESOPHAGOGASTRODUODENOSCOPY (EGD)  2006  . EXPLANTATION BREAST IMPLANT AND DEBRIDEMENT Left 09/15/2005  . INTERSTIM IMPLANT PLACEMENT N/A 08/22/2014   Procedure: Barrie Lyme IMPLANT STAGE ONE/TWO;  Surgeon: Reece Packer, MD;  Location: [facility];  Service: Urology;  Laterality: N/A;  . JOINT REPLACEMENT Left 2017   knee replacement  . MASTECTOMY    . MASTECTOMY, MODIFIED RADICAL W/RECONSTRUCTION  09-16-2003   left breast W/ SLN DISSECTION (pt states had 13 operations for implant infection   . REPLACE TISSUE EXPANDER LEFT BREAST AND TOTAL CAPSULECTOMY  08-05-2006  . REVISION BREAST RECONSTRUCTION Left 11-11-2004   12-03-2004  DRAINAGE SEREMA LEFT CHEST  . TEE WITHOUT CARDIOVERSION N/A 12/14/2016   Procedure: TRANSESOPHAGEAL ECHOCARDIOGRAM (TEE);  Surgeon: Burnell Blanks, MD;  Location: [place];  Service: Open Heart Surgery;  Laterality: N/A;  . TOTAL HIP ARTHROPLASTY Left 07/17/2013   Procedure: LEFT TOTAL HIP ARTHROPLASTY ANTERIOR APPROACH;  Surgeon: Mauri Pole, MD;  Location: [facility];   Service: Orthopedics;  Laterality: Left;  . TOTAL HIP ARTHROPLASTY Right 01/28/2015   Procedure: RIGHT TOTAL HIP ARTHROPLASTY ANTERIOR APPROACH;  Surgeon: Paralee Cancel, MD;  Location: [facility];  Service: Orthopedics;  Laterality: Right;  . TOTAL KNEE ARTHROPLASTY Left 11/11/2015   Procedure: LEFT TOTAL KNEE ARTHROPLASTY;  Surgeon: Paralee Cancel, MD;  Location: [facility];  Service: Orthopedics;  Laterality: Left;  . TOTAL SHOULDER ARTHROPLASTY  08/11/2012   Procedure: TOTAL SHOULDER ARTHROPLASTY;  Surgeon: Augustin Schooling, MD;  Location: [place];  Service: Orthopedics;  Laterality: Right;  RIGHT  TOTAL SHOULDER  ARTHROPLASTY   . TRANSCATHETER AORTIC VALVE REPLACEMENT, TRANSFEMORAL N/A 12/14/2016   Procedure: TRANSCATHETER AORTIC VALVE REPLACEMENT, TRANSFEMORAL;  Surgeon: Burnell Blanks, MD;  Location: [place];  Service: Open Heart Surgery;  Laterality: N/A;  . TRANSTHORACIC ECHOCARDIOGRAM  02-25-2014   moderate LVH/  ef 55-60%/  mod. to sev. calcification AV with mild to moderate AV stenosis/  mild to mod. AR and  MR/ mild PR/   moderate to severe LAE  . TUBAL LIGATION  1973     Current Meds  Medication Sig  . acetaminophen (TYLENOL) 325 MG tablet Take 650 mg by mouth 2 (two) times daily.  Marland Kitchen amoxicillin (AMOXIL) 500 MG capsule TAKE 4 CAPSULES BY MOUTH 1 HOUR BEFORE DENTAL APPT FOR PREMED  . atorvastatin (LIPITOR) 80 MG tablet Take 1 tablet (80 mg total) by mouth daily.  . Calcium Carbonate-Vitamin D (CALCIUM-VITAMIN D) 500-200 MG-UNIT tablet Take 1 tablet by mouth daily.  Marland Kitchen diltiazem (TIAZAC) 180 MG 24 hr capsule Take 180 mg by mouth 2 (two) times daily.  Marland Kitchen ezetimibe (ZETIA) 10 MG tablet Take 1 tablet (10 mg total) by mouth daily.  . furosemide (LASIX) 20 MG tablet Take 20 mg by mouth as needed.  Marland Kitchen losartan (COZAAR) 50 MG tablet Take 50 mg by mouth daily.  . Melatonin 10 MG TABS Take 1 tablet by mouth as needed (for sleep).   . nitroGLYCERIN (NITROSTAT) 0.4 MG SL tablet Place 0.4 mg under the  tongue every 5 (five) minutes as needed for chest pain. For chest pain   . omeprazole (PRILOSEC) 20 MG capsule Take 20 mg by mouth daily.   . polycarbophil (FIBER-CAPS) 625 MG tablet Take 625 mg by mouth daily.  . predniSONE (DELTASONE) 5 MG tablet Take 5 mg by mouth daily with breakfast.  . rivaroxaban (XARELTO) 20 MG TABS tablet Take 20 mg by mouth daily with supper.  . TURMERIC PO Take 1,000 mg by mouth 2 (two) times daily.     Allergies:   Beta adrenergic blockers, Other, Oxycodone, Sotalol hcl, Tramadol, Clonidine, Clonidine derivatives, Crestor [rosuvastatin calcium], Latex, Rosuvastatin calcium, and Tape   Social History   Tobacco Use  . Smoking status: Former Smoker    Packs/day: 0.25    Years: 8.00    Pack years: 2.00    Types: Cigarettes    Quit date: 10/18/1966    Years since quitting: 52.8  . Smokeless tobacco: Never Used  Substance Use Topics  . Alcohol use: No  . Drug use: No     Family Hx: The patient's family history includes Heart disease in her father and mother.  ROS:   Please see the history of present illness.     All other systems reviewed and are negative.   Labs/Other Tests and Data Reviewed:    Recent Labs: 03/27/2019: BUN 21; Creatinine, Ser 1.00; Hemoglobin 15.1; Platelets 212; Potassium 4.2; Sodium 144 06/27/2019: ALT 27   Recent Lipid Panel Lab Results  Component Value Date/Time   CHOL 171 06/27/2019 09:01 AM   TRIG 155 (H) 06/27/2019 09:01 AM   HDL 67 06/27/2019 09:01 AM   CHOLHDL 2.6 06/27/2019 09:01 AM   LDLCALC 78 06/27/2019 09:01 AM    Wt Readings from Last 3 Encounters:  08/08/19 191 lb (86.6 kg)  02/09/19 185 lb (83.9 kg)  07/04/18 195 lb (88.5 kg)     Objective:    Vital Signs:  BP (!) 159/89   Pulse 75   Ht 5\' 6"  (1.676 m)   Wt 191 lb (86.6 kg)   BMI 30.83 kg/m     ASSESSMENT & PLAN:    1.  Aortic stenosis  -s/p TAVR with 42mm Edwards Sapien 3 bioprosthesis 11/2016.   -doing well -repeat 2D echo since she is now 2  years out  2.  ASCAD  -cath 2018 with  70% focal ostial stenosis of the first diagonal branch of the  left anterior descending coronary artery, 30-40% stenosis of the proximal right coronary artery, and otherwise very mild nonobstructive coronary artery disease.   -she has been on medical management with no angina -continue statin.   -no ASA due to DOAC for afib.  3.  Chronic diastolic CHF  -denies any SOB or LE edema -weight is stable -continue lasix 20mg  PRN  4.   Hyperlipidemia  -LDL goal < 70 -LDL was 90 in June -repeat FLP and ALT -continue atorvastatin 80mg  daily and Zetia 10mg  daily  5.  Hypertension  -BP mildly elevated today but she says that it usually is 145-150/50mmHg. -continue Cardizem CD 180mg  BID -increase Losartan to 100mg  daily -check BP daily for a week and call with results  6.  OSA -  The patient is tolerating PAP therapy well without any problems. The patient has been using and benefiting from PAP use and will continue to benefit from therapy. I will get a download from her DME  7.  Chronic atrial fibrillation  -HR well controlled with no palpitations -denies any bleeding problems on Xarelto -continue Cardizem CD 180mg  BID for rate control -creatinine 1 and Hbg 15 in June   8.  Pulmonary HTN  - Moderate by echo 01/2018 with PASP 40mmHg -repeat 2D echo to reassess  COVID-19 Education: The signs and symptoms of COVID-19 were discussed with the patient and how to seek care for testing (follow up with PCP or arrange E-visit).  The importance of social distancing was discussed today.  Patient Risk:   After full review of this patient's clinical status, I feel that they are at least moderate risk at this time.  Time:   Today, I have spent 20 minutes directly with the patient on telemedicine discussing medical problems including CAD, HTN, HLD, afib, OSA, pulm HTN.  We also reviewed the symptoms of COVID 19 and the ways to protect against contracting the  virus with telehealth technology.  I spent an additional 5 minutes reviewing patient's chart including labs and 2D echo.  Medication Adjustments/Labs and Tests Ordered: Current medicines are reviewed at length with the patient today.  Concerns regarding medicines are outlined above.  Tests Ordered: No orders of the defined types were placed in this encounter.  Medication Changes: No orders of the defined types were placed in this encounter.   Disposition:  Follow up in 6 month(s) virtual with PA and 1 year with me  Signed, Jacqueline Him, MD  08/08/2019 2:43 PM    [place]

## 2019-08-08 NOTE — Patient Instructions (Signed)
Medication Instructions:  Your physician has recommended you make the following change in your medication:  INCREASE Losartan (Cozaar) to 100 mg daily  *If you need a refill on your cardiac medications before your next appointment, please call your pharmacy*  Lab Work: None Ordered    Testing/Procedures: Your physician has requested that you have an echocardiogram. Echocardiography is a painless test that uses sound waves to create images of your heart. It provides your doctor with information about the size and shape of your heart and how well your heart's chambers and valves are working. This procedure takes approximately one hour. There are no restrictions for this procedure.   HOW TO TAKE YOUR BLOOD PRESSURE:  Rest 5 minutes before taking your blood pressure.   Don't smoke or drink caffeinated beverages for at least 30 minutes before.  Take your blood pressure before (not after) you eat.  Sit comfortably with your back supported and both feet on the floor (don't cross your legs).  Elevate your arm to heart level on a table or a desk.  Use the proper sized cuff. It should fit smoothly and snugly around your bare upper arm. There should be enough room to slip a fingertip under the cuff. The bottom edge of the cuff should be 1 inch above the crease of the elbow.  Ideally, take 3 measurements at one sitting and record the average.  **Check your blood pressure daily for 1 week - make certain it is 1-2 hours after you take your medication Call us or send a message with the recordings   Follow-Up: At [facility], you and your health needs are our priority.  As part of our continuing mission to provide you with exceptional heart care, we have created designated Provider Care Teams.  These Care Teams include your primary Cardiologist (physician) and Advanced Practice Providers (APPs -  Physician Assistants and Nurse Practitioners) who all work together to provide you with the care  you need, when you need it.  Your next appointment:   12 months  The format for your next appointment:   In Person  Provider:   You may see Fransico Him, MD or one of the following Advanced Practice Providers on your designated Care Team:    Melina Copa, PA-C  Ermalinda Barrios, PA-C

## 2019-08-10 ENCOUNTER — Encounter (HOSPITAL_COMMUNITY): Payer: Self-pay | Admitting: Cardiology

## 2019-08-20 ENCOUNTER — Other Ambulatory Visit: Payer: Self-pay | Admitting: *Deleted

## 2019-08-20 MED ORDER — DILTIAZEM HCL ER COATED BEADS 240 MG PO CP24: 240.0000 mg | ORAL_CAPSULE | Freq: Every day | ORAL | 3 refills | Status: DC

## 2019-08-22 ENCOUNTER — Ambulatory Visit (HOSPITAL_COMMUNITY): Payer: Medicare Other | Attending: Cardiovascular Disease

## 2019-08-22 ENCOUNTER — Other Ambulatory Visit: Payer: Self-pay

## 2019-08-22 DIAGNOSIS — I4821 Permanent atrial fibrillation: Secondary | ICD-10-CM

## 2019-08-22 DIAGNOSIS — I272 Pulmonary hypertension, unspecified: Secondary | ICD-10-CM | POA: Diagnosis not present

## 2019-08-22 DIAGNOSIS — I5032 Chronic diastolic (congestive) heart failure: Secondary | ICD-10-CM | POA: Diagnosis not present

## 2019-08-22 DIAGNOSIS — I350 Nonrheumatic aortic (valve) stenosis: Secondary | ICD-10-CM

## 2019-08-22 DIAGNOSIS — Z952 Presence of prosthetic heart valve: Secondary | ICD-10-CM

## 2019-08-23 NOTE — Telephone Encounter (Signed)
Pt aware of echo results and was also asking re bone density suggested calling PMD to make arrangements and agrees with this suggestion ./cy

## 2019-08-28 ENCOUNTER — Other Ambulatory Visit: Payer: Self-pay

## 2019-08-28 ENCOUNTER — Other Ambulatory Visit: Payer: Medicare Other

## 2019-08-28 DIAGNOSIS — E785 Hyperlipidemia, unspecified: Secondary | ICD-10-CM | POA: Diagnosis not present

## 2019-08-28 LAB — LIPID PANEL
Chol/HDL Ratio: 1.9 ratio (ref 0.0–4.4)
Cholesterol, Total: 161 mg/dL (ref 100–199)
HDL: 84 mg/dL (ref >39)
LDL Chol Calc (NIH): 57 mg/dL (ref 0–99)
Triglycerides: 114 mg/dL (ref 0–149)
VLDL Cholesterol Cal: 20 mg/dL (ref 5–40)

## 2019-08-28 LAB — ALT: ALT: 27 IU/L (ref 0–32)

## 2019-08-29 ENCOUNTER — Telehealth: Payer: Self-pay | Admitting: *Deleted

## 2019-08-29 NOTE — Telephone Encounter (Signed)
-----   Message from Sueanne Margarita, MD sent at 08/08/2019  2:48 PM EDT ----- Get a download from DME

## 2019-08-29 NOTE — Telephone Encounter (Signed)
Reached out to patient to ask when our office could expect to get the download Dr Radford Pax asked for.lmtcb

## 2019-09-06 DIAGNOSIS — M810 Age-related osteoporosis without current pathological fracture: Secondary | ICD-10-CM | POA: Diagnosis not present

## 2019-09-06 DIAGNOSIS — S5291XA Unspecified fracture of right forearm, initial encounter for closed fracture: Secondary | ICD-10-CM | POA: Diagnosis not present

## 2019-09-06 DIAGNOSIS — Z9012 Acquired absence of left breast and nipple: Secondary | ICD-10-CM | POA: Diagnosis not present

## 2019-09-07 DIAGNOSIS — J0100 Acute maxillary sinusitis, unspecified: Secondary | ICD-10-CM | POA: Diagnosis not present

## 2019-09-20 ENCOUNTER — Telehealth: Payer: Self-pay | Admitting: *Deleted

## 2019-09-20 NOTE — Telephone Encounter (Signed)
   Primary Cardiologist: Fransico Him, MD  Chart reviewed as part of pre-operative protocol coverage. She was last seen by Dr. Radford Pax on 08/08/2019 and was stable.Given past medical history and time since last visit, based on ACC/AHA guidelines, [name] would be at acceptable risk for the planned procedure without further cardiovascular testing.          Pt takes Xarelto for afib with CHADS2VASc score of 8 (age x2, sex, CHF, HTN, CAD, TIA). Renal function and platelets are normal. Recommend only holding Xarelto for 1 day prior due to elevated cardiac risk including history of stroke. If longer than 1 day hold is required, will need to defer to MD for input.        I will route this recommendation to the requesting party via Epic fax function and remove from pre-op pool.  Please call with questions.  Phill Myron. Daire Okimoto DNP, ANP, AACC  09/20/2019, 10:52 AM

## 2019-09-20 NOTE — Telephone Encounter (Signed)
   [facility] Pre-operative Risk Assessment    Request for surgical clearance:  1. What type of surgery is being performed? LEFT REVERSE TOTAL SHOULDER   2. When is this surgery scheduled? TBD   3. What type of clearance is required (medical clearance vs. Pharmacy clearance to hold med vs. Both)? BOTH  4. Are there any medications that need to be held prior to surgery and how long? [place]   5. Practice name and name of physician performing surgery? [facility]; DR. Remo Lipps [facility]   6. What is your office phone number [phone]    7.   What is your office fax number [phone]  8.   Anesthesia type (None, local, MAC, general) ?  GENERAL   Julaine Hua 09/20/2019, 10:16 AM  _________________________________________________________________   (provider comments below)

## 2019-09-20 NOTE — Telephone Encounter (Signed)
Pt takes Xarelto for afib with CHADS2VASc score of 8 (age x2, sex, CHF, HTN, CAD, TIA). Renal function and platelets are normal. Recommend only holding Xarelto for 1 day prior due to elevated cardiac risk including history of stroke. If longer than 1 day hold is required, will need to defer to MD for input.

## 2019-09-21 MED ORDER — DILTIAZEM HCL ER COATED BEADS 300 MG PO CP24: 300.0000 mg | ORAL_CAPSULE | Freq: Every day | ORAL | 3 refills | Status: DC

## 2019-09-25 DIAGNOSIS — M25512 Pain in left shoulder: Secondary | ICD-10-CM | POA: Diagnosis not present

## 2019-10-08 NOTE — Telephone Encounter (Signed)
Download received and sent to be scanned.

## 2019-10-16 NOTE — H&P (Signed)
Patient's anticipated LOS is less than 2 midnights, meeting these requirements: - Younger than 60 - Lives within 1 hour of care - Has a competent adult at home to recover with post-op recover - NO history of  - Chronic pain requiring opiods  - Diabetes  - Coronary Artery Disease  - Heart failure  - Heart attack  - Stroke  - DVT/VTE  - Cardiac arrhythmia  - Respiratory Failure/COPD  - Renal failure  - Anemia  - Advanced Liver disease       [name] is an 83 y.o. female.    Chief Complaint: left shoulder pain  HPI: Pt is a 83 y.o. female complaining of left shoulder pain for multiple years. Pain had continually increased since the beginning. X-rays in the clinic show end-stage arthritic changes of the left shoulder. Pt has tried various conservative treatments which have failed to alleviate their symptoms, including injections and therapy. Various options are discussed with the patient. Risks, benefits and expectations were discussed with the patient. Patient understand the risks, benefits and expectations and wishes to proceed with surgery.   PCP:  Maurice Small, MD  D/C Plans: Home  PMH: Past Medical History:  Diagnosis Date  . Anginal pain ([place])   . Aortic stenosis    severe AS with moderate AR by echo with normal LVF  . Arthritis   . CAD in native artery, non obstructive cath 11/2016.  12/16/2016  . Cancer [facility])    left breast mastectomy(cancer)-surgery only  . Chronic diastolic congestive heart failure ([place])   . Complication of anesthesia    extremely claustophobic(only occurs with narcotic medications as stated per pt)  . Diverticulosis   . Fibromyalgia   . GERD (gastroesophageal reflux disease)   . Hard of hearing    wears hearing aides   . History of breast cancer no recurrence   dx 2004   s/p  left breast mastectomy w/ node dissection/   no chemo or radiation  . History of TIA (transient ischemic attack)    per scan--  no residual  . Hyperlipidemia    . Hypertension   . Nocturia   . OSA on CPAP     severe/  AHI 34/hr  . Permanent atrial fibrillation (HCC)    on Xarelto for CHADS2VASC score of 5  . Pulmonary HTN ([place])    PASP 48mmHg by echo 11/2016  . Stroke ([place])    tia  . Urge urinary incontinence   . Wears glasses     PSH: Past Surgical History:  Procedure Laterality Date  . ABDOMINOPLASTY  1987  . APPENDECTOMY  1965  . BREAST SURGERY Left 2004   mastectomy   . CARDIAC CATHETERIZATION N/A 11/19/2016   Procedure: Right/Left Heart Cath and Coronary Angiography;  Surgeon: Troy Sine, MD;  Location: [facility];  Service: Cardiovascular;  Laterality: N/A;  . CARPAL TUNNEL RELEASE Right 07-08-2004  . COLONOSCOPY WITH ESOPHAGOGASTRODUODENOSCOPY (EGD)  2006  . EXPLANTATION BREAST IMPLANT AND DEBRIDEMENT Left 09/15/2005  . INTERSTIM IMPLANT PLACEMENT N/A 08/22/2014   Procedure: Barrie Lyme IMPLANT STAGE ONE/TWO;  Surgeon: Reece Packer, MD;  Location: [facility];  Service: Urology;  Laterality: N/A;  . JOINT REPLACEMENT Left 2017   knee replacement  . MASTECTOMY    . MASTECTOMY, MODIFIED RADICAL W/RECONSTRUCTION  09-16-2003   left breast W/ SLN DISSECTION (pt states had 13 operations for implant infection   . REPLACE TISSUE EXPANDER LEFT BREAST AND TOTAL CAPSULECTOMY  08-05-2006  . REVISION BREAST RECONSTRUCTION Left 11-11-2004   12-03-2004  DRAINAGE SEREMA LEFT CHEST  . TEE WITHOUT CARDIOVERSION N/A 12/14/2016   Procedure: TRANSESOPHAGEAL ECHOCARDIOGRAM (TEE);  Surgeon: Burnell Blanks, MD;  Location: [facility];  Service: Open Heart Surgery;  Laterality: N/A;  . TOTAL HIP ARTHROPLASTY Left 07/17/2013   Procedure: LEFT TOTAL HIP ARTHROPLASTY ANTERIOR APPROACH;  Surgeon: Mauri Pole, MD;  Location: [facility];  Service: Orthopedics;  Laterality: Left;  . TOTAL HIP ARTHROPLASTY Right 01/28/2015   Procedure: RIGHT TOTAL HIP ARTHROPLASTY ANTERIOR APPROACH;  Surgeon: Paralee Cancel, MD;  Location: [facility];   Service: Orthopedics;  Laterality: Right;  . TOTAL KNEE ARTHROPLASTY Left 11/11/2015   Procedure: LEFT TOTAL KNEE ARTHROPLASTY;  Surgeon: Paralee Cancel, MD;  Location: [facility];  Service: Orthopedics;  Laterality: Left;  . TOTAL SHOULDER ARTHROPLASTY  08/11/2012   Procedure: TOTAL SHOULDER ARTHROPLASTY;  Surgeon: Augustin Schooling, MD;  Location: [place];  Service: Orthopedics;  Laterality: Right;  RIGHT  TOTAL SHOULDER  ARTHROPLASTY   . TRANSCATHETER AORTIC VALVE REPLACEMENT, TRANSFEMORAL N/A 12/14/2016   Procedure: TRANSCATHETER AORTIC VALVE REPLACEMENT, TRANSFEMORAL;  Surgeon: Burnell Blanks, MD;  Location: [facility];  Service: Open Heart Surgery;  Laterality: N/A;  . TRANSTHORACIC ECHOCARDIOGRAM  02-25-2014   moderate LVH/  ef 55-60%/  mod. to sev. calcification AV with mild to moderate AV stenosis/  mild to mod. AR and MR/ mild PR/   moderate to severe LAE  . TUBAL LIGATION  1973    Social History:  reports that she quit smoking about 53 years ago. Her smoking use included cigarettes. She has a 2.00 pack-year smoking history. She has never used smokeless tobacco. She reports that she does not drink alcohol or use drugs.  Allergies:  Allergies  Allergen Reactions  . Beta Adrenergic Blockers Shortness Of Breath  . Other     Narcotic pain medications - anxiety attacks  . Oxycodone Shortness Of Breath and Other (See Comments)    Tolerates Dilaudid, Fentanyl  . Sotalol Hcl Shortness Of Breath and Other (See Comments)  . Tramadol Anxiety and Other (See Comments)    Can take if absolutely necessary  . Clonidine Other (See Comments)  . Clonidine Derivatives Palpitations    bradycardia  . Crestor [Rosuvastatin Calcium] Other (See Comments)    MYALGIAS  . Latex Rash    Per patient-bandaids- no problems with gloves  . Rosuvastatin Calcium Nausea Only  . Tape Rash     bandaids     Medications: No current facility-administered medications for this encounter.   Current Outpatient  Medications  Medication Sig Dispense Refill  . acetaminophen (TYLENOL) 325 MG tablet Take 650 mg by mouth 2 (two) times daily.    Marland Kitchen amoxicillin (AMOXIL) 500 MG capsule TAKE 4 CAPSULES BY MOUTH 1 HOUR BEFORE DENTAL APPT FOR PREMED  0  . atorvastatin (LIPITOR) 80 MG tablet Take 1 tablet (80 mg total) by mouth daily. 90 tablet 3  . Calcium Carbonate-Vitamin D (CALCIUM-VITAMIN D) 500-200 MG-UNIT tablet Take 1 tablet by mouth daily.    Marland Kitchen diltiazem (CARDIZEM CD) 300 MG 24 hr capsule Take 1 capsule (300 mg total) by mouth daily. 90 capsule 3  . ezetimibe (ZETIA) 10 MG tablet Take 1 tablet (10 mg total) by mouth daily. 90 tablet 3  . furosemide (LASIX) 20 MG tablet Take 20 mg by mouth as needed.    Marland Kitchen losartan (COZAAR) 100 MG tablet Take 1 tablet (100 mg total) by mouth daily.  90 tablet 3  . Melatonin 10 MG TABS Take 1 tablet by mouth as needed (for sleep).     . nitroGLYCERIN (NITROSTAT) 0.4 MG SL tablet Place 0.4 mg under the tongue every 5 (five) minutes as needed for chest pain. For chest pain     . omeprazole (PRILOSEC) 20 MG capsule Take 20 mg by mouth daily.     . polycarbophil (FIBER-CAPS) 625 MG tablet Take 625 mg by mouth daily.    . predniSONE (DELTASONE) 5 MG tablet Take 5 mg by mouth daily with breakfast.    . rivaroxaban (XARELTO) 20 MG TABS tablet Take 20 mg by mouth daily with supper.    . TURMERIC PO Take 1,000 mg by mouth 2 (two) times daily.      No results found for this or any previous visit (from the past 48 hour(s)). No results found.  ROS: Pain with rom of the left upper extremity  Physical Exam: Alert and oriented 83 y.o. female in no acute distress Cranial nerves 2-12 intact Cervical spine: full rom with no tenderness, nv intact distally Chest: active breath sounds bilaterally, no wheeze rhonchi or rales Heart: regular rate and rhythm, no murmur Abd: non tender non distended with active bowel sounds Hip is stable with rom  Left shoulder with painful rom nv intact  distally Weakness with ER and IR No rashes or edema  Assessment/Plan Assessment: left shoulder cuff arthropathy  Plan:  Patient will undergo a left reverse total shoulder  by Dr. Veverly Fells at [facility]. Risks benefits and expectations were discussed with the patient. Patient understand risks, benefits and expectations and wishes to proceed. Preoperative templating of the joint replacement has been completed, documented, and submitted to the Operating Room personnel in order to optimize intra-operative equipment management.   Merla Riches PA-C, MPAS [facility] is now [employer] [address], [place], [place], [place] [address] Phone: [phone] [email or web address] Facebook  [employer]

## 2019-10-18 ENCOUNTER — Telehealth: Payer: Self-pay | Admitting: *Deleted

## 2019-10-18 DIAGNOSIS — G4733 Obstructive sleep apnea (adult) (pediatric): Secondary | ICD-10-CM

## 2019-10-18 NOTE — Telephone Encounter (Signed)
Informed patient of compliance results and verbalized understanding was indicated.  Patient is aware and agreeable to AHI being too high at 7.1. Patient states she will have surgery on 10/26/19 and will be quarantining until then and will not be going out until she has recovered. She will take her unit to the retail store after she has recovered.  I will send the order over at this time to [place].

## 2019-10-18 NOTE — Telephone Encounter (Signed)
-----   Message from Sueanne Margarita, MD sent at 10/17/2019 11:48 AM EST ----- AHI too high - increase CPAP to 8cm H2O and encourage increased compliance.  Get a dowlnoad in 2 weeks

## 2019-10-22 NOTE — Patient Instructions (Addendum)
DUE TO COVID-19 ONLY ONE VISITOR IS ALLOWED TO COME WITH YOU AND STAY IN THE WAITING ROOM ONLY DURING PRE OP AND PROCEDURE DAY OF SURGERY. THE 1 VISITOR MAY VISIT WITH YOU AFTER SURGERY IN YOUR PRIVATE ROOM DURING VISITING HOURS ONLY!  YOU NEED TO HAVE A COVID 19 TEST ON: 10/23/19 @ 11:00 am      , THIS TEST MUST BE DONE BEFORE SURGERY, COME  [place], [place] , [address].  ([place]) ONCE YOUR COVID TEST IS COMPLETED, PLEASE BEGIN THE QUARANTINE INSTRUCTIONS AS OUTLINED IN YOUR HANDOUT.                BREYLIN KJELLBERG    Your procedure is scheduled on: 10/26/2019   Report to [facility] Main  Entrance   Report to admitting at: 7:30  AM     Call this number if you have problems the morning of surgery [phone]    Remember:    [place], NO [place].     Take these medicines the morning of surgery with A SIP OF WATER: Lipitor,Diltiazem,ezetimibe,omeprazole,prednisone.                                 You may not have any metal on your body including hair pins and              piercings  Do not wear jewelry, make-up, lotions, powders or perfumes, deodorant             Do not wear nail polish on your fingernails.  Do not shave  48 hours prior to surgery.               Do not bring valuables to the hospital. [place].  Contacts, dentures or bridgework may not be worn into surgery.  Leave suitcase in the car. After surgery it may be brought to your room.     NO SOLID FOOD AFTER MIDNIGHT THE NIGHT PRIOR TO SURGERY. NOTHING BY MOUTH EXCEPT CLEAR LIQUIDS UNTIL: 7:00 AM . PLEASE FINISH ENSURE DRINK PER SURGEON ORDER  WHICH NEEDS TO BE COMPLETED AT :7:00 AM              CLEAR LIQUID DIET   Foods Allowed                                                                     Foods Excluded  Coffee and tea, regular and decaf                              liquids that you cannot  Plain Jell-O any favor except red or purple                                           see through such as: Fruit ices (not  with fruit pulp)                                     milk, soups, orange juice  Iced Popsicles                                    All solid food Carbonated beverages, regular and diet                                    Cranberry, grape and apple juices Sports drinks like Gatorade Lightly seasoned clear broth or consume(fat free) Sugar, honey syrup  Sample Menu Breakfast                                Lunch                                     Supper Cranberry juice                    Beef broth                            Chicken broth Jell-O                                     Grape juice                           Apple juice Coffee or tea                        Jell-O                                      Popsicle                                                Coffee or tea                        Coffee or tea  _____________________________________________________________________   [facility] - Preparing for Surgery Before surgery, you can play an important role.  Because skin is not sterile, your skin needs to be as free of germs as possible.  You can reduce the number of germs on your skin by washing with CHG (chlorahexidine gluconate) soap before surgery.  CHG is an antiseptic cleaner which kills germs and bonds with the skin to continue killing germs even after washing. Please DO NOT use if you have an allergy to CHG or antibacterial soaps.  If your skin becomes reddened/irritated stop using the CHG and inform your nurse when you arrive at Short Stay. Do not shave (including legs and underarms) for at least 48 hours prior to  the first CHG shower.  You may shave your face/neck. Please follow these instructions carefully:  1.  Shower with CHG Soap the night before surgery and the  morning of Surgery.  2.  If you choose  to wash your hair, wash your hair first as usual with your  normal  shampoo.  3.  After you shampoo, rinse your hair and body thoroughly to remove the  shampoo.                           4.  Use CHG as you would any other liquid soap.  You can apply chg directly  to the skin and wash                       Gently with a scrungie or clean washcloth.  5.  Apply the CHG Soap to your body ONLY FROM THE NECK DOWN.   Do not use on face/ open                           Wound or open sores. Avoid contact with eyes, ears mouth and genitals (private parts).                       Wash face,  Genitals (private parts) with your normal soap.             6.  Wash thoroughly, paying special attention to the area where your surgery  will be performed.  7.  Thoroughly rinse your body with warm water from the neck down.  8.  DO NOT shower/wash with your normal soap after using and rinsing off  the CHG Soap.                9.  Pat yourself dry with a clean towel.            10.  Wear clean pajamas.            11.  Place clean sheets on your bed the night of your first shower and do not  sleep with pets. Day of Surgery : Do not apply any lotions/deodorants the morning of surgery.  Please wear clean clothes to the hospital/surgery center.  FAILURE TO FOLLOW THESE INSTRUCTIONS MAY RESULT IN THE CANCELLATION OF YOUR SURGERY PATIENT SIGNATURE_________________________________  NURSE SIGNATURE__________________________________  ________________________________________________________________________     Adam Phenix  An incentive spirometer is a tool that can help keep your lungs clear and active. This tool measures how well you are filling your lungs with each breath. Taking long deep breaths may help reverse or decrease the chance of developing breathing (pulmonary) problems (especially infection) following:  A long period of time when you are unable to move or be active. BEFORE THE PROCEDURE   If the  spirometer includes an indicator to show your best effort, your nurse or respiratory therapist will set it to a desired goal.  If possible, sit up straight or lean slightly forward. Try not to slouch.  Hold the incentive spirometer in an upright position. INSTRUCTIONS FOR USE  1. Sit on the edge of your bed if possible, or sit up as far as you can in bed or on a chair. 2. Hold the incentive spirometer in an upright position. 3. Breathe out normally. 4. Place the mouthpiece in your mouth and seal your lips tightly around it. 5. Breathe  in slowly and as deeply as possible, raising the piston or the ball toward the top of the column. 6. Hold your breath for 3-5 seconds or for as long as possible. Allow the piston or ball to fall to the bottom of the column. 7. Remove the mouthpiece from your mouth and breathe out normally. 8. Rest for a few seconds and repeat Steps 1 through 7 at least 10 times every 1-2 hours when you are awake. Take your time and take a few normal breaths between deep breaths. 9. The spirometer may include an indicator to show your best effort. Use the indicator as a goal to work toward during each repetition. 10. After each set of 10 deep breaths, practice coughing to be sure your lungs are clear. If you have an incision (the cut made at the time of surgery), support your incision when coughing by placing a pillow or rolled up towels firmly against it. Once you are able to get out of bed, walk around indoors and cough well. You may stop using the incentive spirometer when instructed by your caregiver.  RISKS AND COMPLICATIONS  Take your time so you do not get dizzy or light-headed.  If you are in pain, you may need to take or ask for pain medication before doing incentive spirometry. It is harder to take a deep breath if you are having pain. AFTER USE  Rest and breathe slowly and easily.  It can be helpful to keep track of a log of your progress. Your caregiver can provide  you with a simple table to help with this. If you are using the spirometer at home, follow these instructions: [place] IF:   You are having difficultly using the spirometer.  You have trouble using the spirometer as often as instructed.  Your pain medication is not giving enough relief while using the spirometer.  You develop fever of 100.5 F (38.1 C) or higher. SEEK IMMEDIATE MEDICAL CARE IF:   You cough up bloody sputum that had not been present before.  You develop fever of 102 F (38.9 C) or greater.  You develop worsening pain at or near the incision site. MAKE SURE YOU:   Understand these instructions.  Will watch your condition.  Will get help right away if you are not doing well or get worse. Document Released: 02/14/2007 Document Revised: 12/27/2011 Document Reviewed: 04/17/2007 [facility] Patient Information 2014 [place], [place].   ________________________________________________________________________

## 2019-10-23 ENCOUNTER — Encounter (HOSPITAL_COMMUNITY)
Admission: RE | Admit: 2019-10-23 | Discharge: 2019-10-23 | Disposition: A | Discharge status: Home / Self Care | Payer: Medicare Other | Source: Ambulatory Visit | Attending: Orthopedic Surgery | Admitting: Orthopedic Surgery

## 2019-10-23 ENCOUNTER — Encounter (HOSPITAL_COMMUNITY): Payer: Self-pay

## 2019-10-23 ENCOUNTER — Encounter (HOSPITAL_COMMUNITY): Payer: Medicare Other

## 2019-10-23 ENCOUNTER — Other Ambulatory Visit: Payer: Self-pay

## 2019-10-23 ENCOUNTER — Other Ambulatory Visit (HOSPITAL_COMMUNITY)
Admission: RE | Admit: 2019-10-23 | Discharge: 2019-10-23 | Disposition: A | Discharge status: Home / Self Care | Payer: Medicare Other | Source: Ambulatory Visit | Attending: Orthopedic Surgery | Admitting: Orthopedic Surgery

## 2019-10-23 DIAGNOSIS — Z01818 Encounter for other preprocedural examination: Secondary | ICD-10-CM | POA: Diagnosis not present

## 2019-10-23 NOTE — Progress Notes (Signed)
PCP - Maurice Small. LOV: 07/2019 as per pt. Cardiologist - Fransico Him. LOV: 10/21. Epic. Clearance: Julaine Hua: NP. 09/20/19. EPIC  Chest x-ray -  EKG -  Stress Test -  ECHO - 08/22/19 EPIC Cardiac Cath -   Sleep Study -  CPAP -   Fasting Blood Sugar -  Checks Blood Sugar _____ times a day  Blood Thinner Instructions:Rivaroxaban. Pt. Said she will stop the medication tomorrow(1/6)as per her cardiologist recommendation. Aspirin Instructions:N/A Last Dose:  Anesthesia review:   Patient denies shortness of breath, fever, cough and chest pain at PAT appointment   Patient verbalized understanding of instructions that were given to them at the PAT appointment. Patient was also instructed that they will need to review over the PAT instructions again at home before surgery.

## 2019-10-24 ENCOUNTER — Encounter (HOSPITAL_COMMUNITY)
Admission: RE | Admit: 2019-10-24 | Discharge: 2019-10-24 | Disposition: A | Discharge status: Home / Self Care | Payer: Medicare Other | Source: Ambulatory Visit | Attending: Orthopedic Surgery | Admitting: Orthopedic Surgery

## 2019-10-24 DIAGNOSIS — Z01818 Encounter for other preprocedural examination: Secondary | ICD-10-CM | POA: Diagnosis not present

## 2019-10-24 DIAGNOSIS — Z953 Presence of xenogenic heart valve: Secondary | ICD-10-CM | POA: Insufficient documentation

## 2019-10-24 DIAGNOSIS — Z96652 Presence of left artificial knee joint: Secondary | ICD-10-CM | POA: Insufficient documentation

## 2019-10-24 DIAGNOSIS — K219 Gastro-esophageal reflux disease without esophagitis: Secondary | ICD-10-CM | POA: Insufficient documentation

## 2019-10-24 DIAGNOSIS — I5032 Chronic diastolic (congestive) heart failure: Secondary | ICD-10-CM | POA: Insufficient documentation

## 2019-10-24 DIAGNOSIS — I110 Hypertensive heart disease with heart failure: Secondary | ICD-10-CM | POA: Insufficient documentation

## 2019-10-24 DIAGNOSIS — I4891 Unspecified atrial fibrillation: Secondary | ICD-10-CM | POA: Insufficient documentation

## 2019-10-24 DIAGNOSIS — Z96611 Presence of right artificial shoulder joint: Secondary | ICD-10-CM | POA: Insufficient documentation

## 2019-10-24 DIAGNOSIS — Z79899 Other long term (current) drug therapy: Secondary | ICD-10-CM | POA: Insufficient documentation

## 2019-10-24 DIAGNOSIS — I2510 Atherosclerotic heart disease of native coronary artery without angina pectoris: Secondary | ICD-10-CM | POA: Insufficient documentation

## 2019-10-24 DIAGNOSIS — Z7983 Long term (current) use of bisphosphonates: Secondary | ICD-10-CM | POA: Insufficient documentation

## 2019-10-24 DIAGNOSIS — Z9012 Acquired absence of left breast and nipple: Secondary | ICD-10-CM | POA: Insufficient documentation

## 2019-10-24 DIAGNOSIS — Z87891 Personal history of nicotine dependence: Secondary | ICD-10-CM | POA: Insufficient documentation

## 2019-10-24 DIAGNOSIS — Z96643 Presence of artificial hip joint, bilateral: Secondary | ICD-10-CM | POA: Insufficient documentation

## 2019-10-24 DIAGNOSIS — I272 Pulmonary hypertension, unspecified: Secondary | ICD-10-CM | POA: Insufficient documentation

## 2019-10-24 DIAGNOSIS — E785 Hyperlipidemia, unspecified: Secondary | ICD-10-CM | POA: Insufficient documentation

## 2019-10-24 DIAGNOSIS — M19012 Primary osteoarthritis, left shoulder: Secondary | ICD-10-CM | POA: Insufficient documentation

## 2019-10-24 DIAGNOSIS — G4733 Obstructive sleep apnea (adult) (pediatric): Secondary | ICD-10-CM | POA: Insufficient documentation

## 2019-10-24 DIAGNOSIS — Z7901 Long term (current) use of anticoagulants: Secondary | ICD-10-CM | POA: Insufficient documentation

## 2019-10-24 DIAGNOSIS — M797 Fibromyalgia: Secondary | ICD-10-CM | POA: Insufficient documentation

## 2019-10-24 DIAGNOSIS — Z853 Personal history of malignant neoplasm of breast: Secondary | ICD-10-CM | POA: Insufficient documentation

## 2019-10-24 LAB — CBC
HCT: 41.5 % (ref 36.0–46.0)
Hemoglobin: 13.3 g/dL (ref 12.0–15.0)
MCH: 32.2 pg (ref 26.0–34.0)
MCHC: 32 g/dL (ref 30.0–36.0)
MCV: 100.5 fL — ABNORMAL HIGH (ref 80.0–100.0)
Platelets: 217 10*3/uL (ref 150–400)
RBC: 4.13 MIL/uL (ref 3.87–5.11)
RDW: 13.9 % (ref 11.5–15.5)
WBC: 8.9 10*3/uL (ref 4.0–10.5)
nRBC: 0 % (ref 0.0–0.2)

## 2019-10-24 LAB — BASIC METABOLIC PANEL
Anion gap: 12 (ref 5–15)
BUN: 26 mg/dL — ABNORMAL HIGH (ref 8–23)
CO2: 25 mmol/L (ref 22–32)
Calcium: 9.3 mg/dL (ref 8.9–10.3)
Chloride: 105 mmol/L (ref 98–111)
Creatinine, Ser: 0.91 mg/dL (ref 0.44–1.00)
GFR calc Af Amer: >60 mL/min (ref >60)
GFR calc non Af Amer: 59 mL/min — ABNORMAL LOW (ref >60)
Glucose, Bld: 101 mg/dL — ABNORMAL HIGH (ref 70–99)
Potassium: 4.5 mmol/L (ref 3.5–5.1)
Sodium: 142 mmol/L (ref 135–145)

## 2019-10-24 LAB — SURGICAL PCR SCREEN
MRSA, PCR: NEGATIVE
Staphylococcus aureus: POSITIVE — AB

## 2019-10-25 LAB — NOVEL CORONAVIRUS, NAA (HOSP ORDER, SEND-OUT TO REF LAB; TAT 18-24 HRS): SARS-CoV-2, NAA: NOT DETECTED

## 2019-10-25 NOTE — Progress Notes (Signed)
Anesthesia Chart Review   Case: [id number] Date/Time: 10/26/19 0950   Procedure: REVERSE SHOULDER ARTHROPLASTY (Left Shoulder) - with interscalene block   Anesthesia type: General   Pre-op diagnosis: Left shoulder osteoarthritis and rotator cuff insufficiency   Location: [facility] 06 / [facility]   Surgeons: Netta Cedars, MD      DISCUSSION:84 y.o. former smoker (2 pack tears, quit 10/18/66) with h/o HTN, GERD, HLD, OSA on CPAP, chronic diastolic heart failure, A-fib (on Xarelto), s/p TAVR with 53mm Edwards Sapien 3 bioprosthesis 11/2016, pulmonary HTN, CAD, left shoulder OA scheduled for above procedure 10/26/2019 with Dr. Netta Cedars.   Hx of CAD with cath in 2018 showing 70% focal ostial stenosis of the first diagonal branch of the left anterior descending coronary artery, 30-40% stenosis of the proximal right coronary artery, and otherwise very mild nonobstructive coronary artery disease  Pt cleared by cardiology 09/20/2019.  Per OV note, "She was last seen by Dr. Radford Pax on 08/08/2019 and was stable.Given past medical history and time since last visit, based on ACC/AHA guidelines, OSHA BORUTA would be at acceptable risk for the planned procedure without further cardiovascular testing. Pt takes Xarelto for afib with CHADS2VASc score of 8 (age x2, sex, CHF, HTN, CAD, TIA). Renal function and platelets are normal. Recommend only holding Xarelto for 1 day prior due to elevated cardiac risk including history of stroke. If longer than 1 day hold is required, will need to defer to MD for input."  Anticipate pt can proceed with planned procedure barring acute status change.   VS: BP (!) 163/83   Pulse 90   Temp 37.2 C (Oral)   Resp 16   Ht 5\' 7"  (1.702 m)   Wt 92.5 kg   SpO2 99%   BMI 31.95 kg/m   PROVIDERS: Maurice Small, MD is PCP   Fransico Him, MD is Cardiologist last seen 08/08/2019 LABS: Labs reviewed: Acceptable for surgery. (all labs ordered are listed, but only abnormal results are  displayed)  Labs Reviewed  SURGICAL PCR SCREEN - Abnormal; Notable for the following components:      Result Value   Staphylococcus aureus POSITIVE (*)    All other components within normal limits  BASIC METABOLIC PANEL - Abnormal; Notable for the following components:   Glucose, Bld 101 (*)    BUN 26 (*)    GFR calc non Af Amer 59 (*)    All other components within normal limits  CBC - Abnormal; Notable for the following components:   MCV 100.5 (*)    All other components within normal limits     IMAGES:   EKG: 10/24/2019 Rate 89 bpm Atrial fibrillation Moderate voltage criteria for LVH, may be normal variant Abnormal ECG Similar to prior tracing  CV: Echo 08/22/2019 IMPRESSIONS    1. Left ventricular ejection fraction, by visual estimation, is 60 to 65%. The left ventricle has normal function. There is moderately increased left ventricular hypertrophy.  2. Left ventricular diastolic parameters are indeterminate.  3. Global right ventricle has normal systolic function.The right ventricular size is normal. No increase in right ventricular wall thickness.  4. Left atrial size was severely dilated.  5. Right atrial size was normal.  6. The mitral valve is normal in structure. Trace mitral valve regurgitation. No evidence of mitral stenosis.  7. The tricuspid valve is normal in structure. Tricuspid valve regurgitation is mild.  8. Aortic valve regurgitation is not visualized. No evidence of aortic valve sclerosis or stenosis.  9.  Post TAVR with 26 mm Sapien 3 No PVL and stable gradients since April 2019. 10. The pulmonic valve was grossly normal. Pulmonic valve regurgitation is mild. 11. Moderately elevated pulmonary artery systolic pressure. 12. The inferior vena cava is normal in size with greater than 50% respiratory variability, suggesting right atrial pressure of 3 mmHg.  Myocardial Perfusion 11/06/2015  Nuclear stress EF: 54%.  There was no ST segment deviation  noted during stress.  The study is normal.  The left ventricular ejection fraction is mildly decreased (45-54%).   Normal stress nuclear study with a small, mild, fixed apical defect consistent with thinning; no ischemia; EF 54 with normal wall motion. Past Medical History:  Diagnosis Date  . Anginal pain ([place])   . Aortic stenosis    severe AS with moderate AR by echo with normal LVF  . Arthritis   . CAD in native artery, non obstructive cath 11/2016.  12/16/2016  . Cancer [facility])    left breast mastectomy(cancer)-surgery only  . Chronic diastolic congestive heart failure ([place])   . Complication of anesthesia    extremely claustophobic(only occurs with narcotic medications as stated per pt)  . Diverticulosis   . Dysrhythmia    Afib  . Fibromyalgia   . GERD (gastroesophageal reflux disease)   . Hard of hearing    wears hearing aides   . History of breast cancer no recurrence   dx 2004   s/p  left breast mastectomy w/ node dissection/   no chemo or radiation  . History of TIA (transient ischemic attack)    per scan--  no residual  . Hyperlipidemia   . Hypertension   . Nocturia   . OSA on CPAP     severe/  AHI 34/hr  . Permanent atrial fibrillation (H[facility])    on Xarelto for CHADS2VASC score of 5  . Pulmonary HTN ([place])    PASP 37mmHg by echo 11/2016  . Urge urinary incontinence   . Wears glasses     Past Surgical History:  Procedure Laterality Date  . ABDOMINOPLASTY  1987  . APPENDECTOMY  1965  . BREAST SURGERY Left 2004   mastectomy   . CARDIAC CATHETERIZATION N/A 11/19/2016   Procedure: Right/Left Heart Cath and Coronary Angiography;  Surgeon: Troy Sine, MD;  Location: [place] [facility];  Service: Cardiovascular;  Laterality: N/A;  . CARPAL TUNNEL RELEASE Right 07-08-2004  . COLONOSCOPY WITH ESOPHAGOGASTRODUODENOSCOPY (EGD)  2006  . EXPLANTATION BREAST IMPLANT AND DEBRIDEMENT Left 09/15/2005  . INTERSTIM IMPLANT PLACEMENT N/A 08/22/2014   Procedure: Barrie Lyme IMPLANT  STAGE ONE/TWO;  Surgeon: Reece Packer, MD;  Location: [facility];  Service: Urology;  Laterality: N/A;  . JOINT REPLACEMENT Left 2017   knee replacement  . MASTECTOMY    . MASTECTOMY, MODIFIED RADICAL W/RECONSTRUCTION  09-16-2003   left breast W/ SLN DISSECTION (pt states had 13 operations for implant infection   . REPLACE TISSUE EXPANDER LEFT BREAST AND TOTAL CAPSULECTOMY  08-05-2006  . REVISION BREAST RECONSTRUCTION Left 11-11-2004   12-03-2004  DRAINAGE SEREMA LEFT CHEST  . TEE WITHOUT CARDIOVERSION N/A 12/14/2016   Procedure: TRANSESOPHAGEAL ECHOCARDIOGRAM (TEE);  Surgeon: Burnell Blanks, MD;  Location: [place];  Service: Open Heart Surgery;  Laterality: N/A;  . TOTAL HIP ARTHROPLASTY Left 07/17/2013   Procedure: LEFT TOTAL HIP ARTHROPLASTY ANTERIOR APPROACH;  Surgeon: Mauri Pole, MD;  Location: [facility];  Service: Orthopedics;  Laterality: Left;  . TOTAL HIP ARTHROPLASTY Right 01/28/2015   Procedure:  RIGHT TOTAL HIP ARTHROPLASTY ANTERIOR APPROACH;  Surgeon: Paralee Cancel, MD;  Location: [facility];  Service: Orthopedics;  Laterality: Right;  . TOTAL KNEE ARTHROPLASTY Left 11/11/2015   Procedure: LEFT TOTAL KNEE ARTHROPLASTY;  Surgeon: Paralee Cancel, MD;  Location: [facility];  Service: Orthopedics;  Laterality: Left;  . TOTAL SHOULDER ARTHROPLASTY  08/11/2012   Procedure: TOTAL SHOULDER ARTHROPLASTY;  Surgeon: Augustin Schooling, MD;  Location: [place];  Service: Orthopedics;  Laterality: Right;  RIGHT  TOTAL SHOULDER  ARTHROPLASTY   . TRANSCATHETER AORTIC VALVE REPLACEMENT, TRANSFEMORAL N/A 12/14/2016   Procedure: TRANSCATHETER AORTIC VALVE REPLACEMENT, TRANSFEMORAL;  Surgeon: Burnell Blanks, MD;  Location: [place];  Service: Open Heart Surgery;  Laterality: N/A;  . TRANSTHORACIC ECHOCARDIOGRAM  02-25-2014   moderate LVH/  ef 55-60%/  mod. to sev. calcification AV with mild to moderate AV stenosis/  mild to mod. AR and MR/ mild PR/   moderate to severe LAE  . TUBAL  LIGATION  1973    MEDICATIONS: . acetaminophen (TYLENOL) 325 MG tablet  . alendronate (FOSAMAX) 70 MG tablet  . amoxicillin (AMOXIL) 500 MG capsule  . atorvastatin (LIPITOR) 80 MG tablet  . Cholecalciferol (QC VITAMIN D3) 50 MCG (2000 UT) TABS  . diltiazem (CARDIZEM CD) 300 MG 24 hr capsule  . ezetimibe (ZETIA) 10 MG tablet  . furosemide (LASIX) 20 MG tablet  . losartan (COZAAR) 100 MG tablet  . Melatonin 10 MG TABS  . nitroGLYCERIN (NITROSTAT) 0.4 MG SL tablet  . omeprazole (PRILOSEC) 20 MG capsule  . polycarbophil (FIBER-CAPS) 625 MG tablet  . predniSONE (DELTASONE) 5 MG tablet  . rivaroxaban (XARELTO) 20 MG TABS tablet  . triamcinolone ointment (KENALOG) 0.1 %  . TURMERIC PO   No current facility-administered medications for this encounter.    Maia Plan [facility] Pre-Surgical Testing [phone] 10/25/19  10:41 AM

## 2019-10-25 NOTE — Anesthesia Preprocedure Evaluation (Addendum)
Anesthesia Evaluation  Patient identified by MRN, date of birth, ID band Patient awake    Reviewed: Allergy & Precautions, NPO status , Patient's Chart, lab work & pertinent test results  Airway Mallampati: III  TM Distance: >3 FB Neck ROM: Full    Dental  (+) Dental Advisory Given   Pulmonary sleep apnea , former smoker,    breath sounds clear to auscultation       Cardiovascular hypertension, Pt. on medications (-) angina+ CAD  + dysrhythmias  Rhythm:Regular Rate:Normal  S/p TAVR. 2020 Normal EF. Mean gradient of 33mmHg across TAVR valve.   Neuro/Psych TIA   GI/Hepatic Neg liver ROS, GERD  ,  Endo/Other  negative endocrine ROS  Renal/GU negative Renal ROS     Musculoskeletal  (+) Arthritis ,   Abdominal   Peds  Hematology  (+) Blood dyscrasia, ,   Anesthesia Other Findings   Reproductive/Obstetrics                            Anesthesia Physical Anesthesia Plan  ASA: III  Anesthesia Plan: General   Post-op Pain Management:  Regional for Post-op pain   Induction: Intravenous  PONV Risk Score and Plan: 3 and Dexamethasone, Ondansetron and Treatment may vary due to age or medical condition  Airway Management Planned: Oral ETT  Additional Equipment:   Intra-op Plan:   Post-operative Plan: Extubation in OR and Possible Post-op intubation/ventilation  Informed Consent: I have reviewed the patients History and Physical, chart, labs and discussed the procedure including the risks, benefits and alternatives for the proposed anesthesia with the patient or authorized representative who has indicated his/her understanding and acceptance.     Dental advisory given  Plan Discussed with: CRNA  Anesthesia Plan Comments: (See PAT note 10/24/2019, Konrad Felix, PA-C)       Anesthesia Quick Evaluation

## 2019-10-26 ENCOUNTER — Encounter (HOSPITAL_COMMUNITY): Payer: Self-pay | Admitting: Orthopedic Surgery

## 2019-10-26 ENCOUNTER — Inpatient Hospital Stay (HOSPITAL_COMMUNITY): Payer: Medicare Other | Admitting: Anesthesiology

## 2019-10-26 ENCOUNTER — Encounter (HOSPITAL_COMMUNITY)
Admission: RE | Disposition: A | Discharge status: Home / Self Care | Payer: Self-pay | Source: Other Acute Inpatient Hospital | Attending: Orthopedic Surgery

## 2019-10-26 ENCOUNTER — Other Ambulatory Visit: Payer: Self-pay

## 2019-10-26 ENCOUNTER — Inpatient Hospital Stay (HOSPITAL_COMMUNITY): Payer: Medicare Other

## 2019-10-26 ENCOUNTER — Inpatient Hospital Stay (HOSPITAL_COMMUNITY): Payer: Medicare Other | Admitting: Physician Assistant

## 2019-10-26 ENCOUNTER — Observation Stay (HOSPITAL_COMMUNITY)
Admission: RE | Admit: 2019-10-26 | Discharge: 2019-10-27 | Disposition: A | Discharge status: Home / Self Care | Payer: Medicare Other | Source: Other Acute Inpatient Hospital | Attending: Orthopedic Surgery | Admitting: Orthopedic Surgery

## 2019-10-26 DIAGNOSIS — K219 Gastro-esophageal reflux disease without esophagitis: Secondary | ICD-10-CM | POA: Insufficient documentation

## 2019-10-26 DIAGNOSIS — Z888 Allergy status to other drugs, medicaments and biological substances status: Secondary | ICD-10-CM | POA: Insufficient documentation

## 2019-10-26 DIAGNOSIS — E785 Hyperlipidemia, unspecified: Secondary | ICD-10-CM | POA: Insufficient documentation

## 2019-10-26 DIAGNOSIS — M19012 Primary osteoarthritis, left shoulder: Secondary | ICD-10-CM | POA: Insufficient documentation

## 2019-10-26 DIAGNOSIS — I272 Pulmonary hypertension, unspecified: Secondary | ICD-10-CM | POA: Diagnosis not present

## 2019-10-26 DIAGNOSIS — Z9012 Acquired absence of left breast and nipple: Secondary | ICD-10-CM | POA: Insufficient documentation

## 2019-10-26 DIAGNOSIS — I350 Nonrheumatic aortic (valve) stenosis: Secondary | ICD-10-CM | POA: Diagnosis not present

## 2019-10-26 DIAGNOSIS — Z96611 Presence of right artificial shoulder joint: Secondary | ICD-10-CM | POA: Insufficient documentation

## 2019-10-26 DIAGNOSIS — I110 Hypertensive heart disease with heart failure: Secondary | ICD-10-CM | POA: Insufficient documentation

## 2019-10-26 DIAGNOSIS — S43422D Sprain of left rotator cuff capsule, subsequent encounter: Secondary | ICD-10-CM | POA: Diagnosis not present

## 2019-10-26 DIAGNOSIS — Z8673 Personal history of transient ischemic attack (TIA), and cerebral infarction without residual deficits: Secondary | ICD-10-CM | POA: Diagnosis not present

## 2019-10-26 DIAGNOSIS — Z87891 Personal history of nicotine dependence: Secondary | ICD-10-CM | POA: Diagnosis not present

## 2019-10-26 DIAGNOSIS — Z96652 Presence of left artificial knee joint: Secondary | ICD-10-CM | POA: Diagnosis not present

## 2019-10-26 DIAGNOSIS — I5032 Chronic diastolic (congestive) heart failure: Secondary | ICD-10-CM | POA: Diagnosis not present

## 2019-10-26 DIAGNOSIS — Z7952 Long term (current) use of systemic steroids: Secondary | ICD-10-CM | POA: Diagnosis not present

## 2019-10-26 DIAGNOSIS — Z885 Allergy status to narcotic agent status: Secondary | ICD-10-CM | POA: Insufficient documentation

## 2019-10-26 DIAGNOSIS — M75102 Unspecified rotator cuff tear or rupture of left shoulder, not specified as traumatic: Principal | ICD-10-CM | POA: Insufficient documentation

## 2019-10-26 DIAGNOSIS — G4733 Obstructive sleep apnea (adult) (pediatric): Secondary | ICD-10-CM | POA: Insufficient documentation

## 2019-10-26 DIAGNOSIS — M797 Fibromyalgia: Secondary | ICD-10-CM | POA: Diagnosis not present

## 2019-10-26 DIAGNOSIS — Z7901 Long term (current) use of anticoagulants: Secondary | ICD-10-CM | POA: Insufficient documentation

## 2019-10-26 DIAGNOSIS — Z79899 Other long term (current) drug therapy: Secondary | ICD-10-CM | POA: Diagnosis not present

## 2019-10-26 DIAGNOSIS — Z952 Presence of prosthetic heart valve: Secondary | ICD-10-CM | POA: Insufficient documentation

## 2019-10-26 DIAGNOSIS — I25119 Atherosclerotic heart disease of native coronary artery with unspecified angina pectoris: Secondary | ICD-10-CM | POA: Insufficient documentation

## 2019-10-26 DIAGNOSIS — Z96643 Presence of artificial hip joint, bilateral: Secondary | ICD-10-CM | POA: Insufficient documentation

## 2019-10-26 DIAGNOSIS — I4821 Permanent atrial fibrillation: Secondary | ICD-10-CM | POA: Insufficient documentation

## 2019-10-26 DIAGNOSIS — Z96612 Presence of left artificial shoulder joint: Secondary | ICD-10-CM | POA: Diagnosis not present

## 2019-10-26 DIAGNOSIS — Z853 Personal history of malignant neoplasm of breast: Secondary | ICD-10-CM | POA: Diagnosis not present

## 2019-10-26 DIAGNOSIS — Z91048 Other nonmedicinal substance allergy status: Secondary | ICD-10-CM | POA: Insufficient documentation

## 2019-10-26 DIAGNOSIS — Z471 Aftercare following joint replacement surgery: Secondary | ICD-10-CM | POA: Diagnosis not present

## 2019-10-26 DIAGNOSIS — Z91040 Latex allergy status: Secondary | ICD-10-CM | POA: Insufficient documentation

## 2019-10-26 HISTORY — PX: REVERSE SHOULDER ARTHROPLASTY: SHX5054

## 2019-10-26 SURGERY — ARTHROPLASTY, SHOULDER, TOTAL, REVERSE
Anesthesia: General | Site: Shoulder | Laterality: Left

## 2019-10-26 MED ORDER — HYDROCODONE-ACETAMINOPHEN 5-325 MG PO TABS: 1.0000 | ORAL_TABLET | Freq: Four times a day (QID) | ORAL | 0 refills | Status: DC | PRN

## 2019-10-26 MED ORDER — ALENDRONATE SODIUM 70 MG PO TABS: 70.0000 mg | ORAL_TABLET | ORAL | Status: DC

## 2019-10-26 MED ORDER — DOCUSATE SODIUM 100 MG PO CAPS
100.0000 mg | ORAL_CAPSULE | Freq: Two times a day (BID) | ORAL | Status: DC
  Administered 2019-10-26 – 2019-10-27 (×2): 100 mg via ORAL
  Filled 2019-10-26 (×2): qty 1

## 2019-10-26 MED ORDER — PREDNISONE 5 MG PO TABS
5.0000 mg | ORAL_TABLET | Freq: Every day | ORAL | Status: DC
  Administered 2019-10-27: 5 mg via ORAL
  Filled 2019-10-26: qty 1

## 2019-10-26 MED ORDER — POLYETHYLENE GLYCOL 3350 17 G PO PACK: 17.0000 g | PACK | Freq: Every day | ORAL | Status: DC | PRN

## 2019-10-26 MED ORDER — BUPIVACAINE-EPINEPHRINE (PF) 0.5% -1:200000 IJ SOLN
INTRAMUSCULAR | Status: DC | PRN
  Administered 2019-10-26: 15 mL via PERINEURAL

## 2019-10-26 MED ORDER — METOCLOPRAMIDE HCL 5 MG/ML IJ SOLN: 5.0000 mg | Freq: Three times a day (TID) | INTRAMUSCULAR | Status: DC | PRN

## 2019-10-26 MED ORDER — HYDROCODONE-ACETAMINOPHEN 5-325 MG PO TABS: 1.0000 | ORAL_TABLET | ORAL | Status: DC | PRN

## 2019-10-26 MED ORDER — PANTOPRAZOLE SODIUM 40 MG PO TBEC
40.0000 mg | DELAYED_RELEASE_TABLET | Freq: Every day | ORAL | Status: DC
  Administered 2019-10-27: 40 mg via ORAL
  Filled 2019-10-26: qty 1

## 2019-10-26 MED ORDER — SODIUM CHLORIDE 0.9 % IV SOLN: INTRAVENOUS | Status: DC

## 2019-10-26 MED ORDER — MIDAZOLAM HCL 2 MG/2ML IJ SOLN
INTRAMUSCULAR | Status: AC
  Administered 2019-10-26: 09:00:00 1 mg
  Filled 2019-10-26: qty 2

## 2019-10-26 MED ORDER — LIDOCAINE 2% (20 MG/ML) 5 ML SYRINGE
INTRAMUSCULAR | Status: DC | PRN
  Administered 2019-10-26: 20 mg via INTRAVENOUS

## 2019-10-26 MED ORDER — CALCIUM POLYCARBOPHIL 625 MG PO TABS
625.0000 mg | ORAL_TABLET | Freq: Every day | ORAL | Status: DC
  Administered 2019-10-27: 625 mg via ORAL
  Filled 2019-10-26: qty 1

## 2019-10-26 MED ORDER — ACETAMINOPHEN 325 MG PO TABS
650.0000 mg | ORAL_TABLET | Freq: Two times a day (BID) | ORAL | Status: DC
  Administered 2019-10-26 – 2019-10-27 (×3): 650 mg via ORAL
  Filled 2019-10-26 (×3): qty 2

## 2019-10-26 MED ORDER — FUROSEMIDE 20 MG PO TABS
20.0000 mg | ORAL_TABLET | Freq: Every day | ORAL | Status: DC
  Administered 2019-10-27: 20 mg via ORAL
  Filled 2019-10-26: qty 1

## 2019-10-26 MED ORDER — METOCLOPRAMIDE HCL 5 MG PO TABS: 5.0000 mg | ORAL_TABLET | Freq: Three times a day (TID) | ORAL | Status: DC | PRN

## 2019-10-26 MED ORDER — BUPIVACAINE-EPINEPHRINE (PF) 0.25% -1:200000 IJ SOLN
INTRAMUSCULAR | Status: DC | PRN
  Administered 2019-10-26: 14 mL via PERINEURAL

## 2019-10-26 MED ORDER — EPHEDRINE SULFATE 50 MG/ML IJ SOLN
INTRAMUSCULAR | Status: DC | PRN
  Administered 2019-10-26 (×2): 5 mg via INTRAVENOUS

## 2019-10-26 MED ORDER — MELATONIN 5 MG PO TABS: 10.0000 mg | ORAL_TABLET | Freq: Every evening | ORAL | Status: DC | PRN

## 2019-10-26 MED ORDER — ONDANSETRON HCL 4 MG PO TABS: 4.0000 mg | ORAL_TABLET | Freq: Three times a day (TID) | ORAL | 1 refills | Status: AC | PRN

## 2019-10-26 MED ORDER — FENTANYL CITRATE (PF) 100 MCG/2ML IJ SOLN
INTRAMUSCULAR | Status: AC
  Filled 2019-10-26: qty 2

## 2019-10-26 MED ORDER — CEFAZOLIN SODIUM-DEXTROSE 2-4 GM/100ML-% IV SOLN
2.0000 g | INTRAVENOUS | Status: AC
  Administered 2019-10-26: 11:00:00 2 g via INTRAVENOUS
  Filled 2019-10-26: qty 100

## 2019-10-26 MED ORDER — DILTIAZEM HCL ER COATED BEADS 180 MG PO CP24
300.0000 mg | ORAL_CAPSULE | Freq: Every day | ORAL | Status: DC
  Administered 2019-10-27: 300 mg via ORAL
  Filled 2019-10-26: qty 1

## 2019-10-26 MED ORDER — DEXAMETHASONE SODIUM PHOSPHATE 10 MG/ML IJ SOLN
INTRAMUSCULAR | Status: DC | PRN
  Administered 2019-10-26: 8 mg via INTRAVENOUS

## 2019-10-26 MED ORDER — ATORVASTATIN CALCIUM 40 MG PO TABS
80.0000 mg | ORAL_TABLET | Freq: Every day | ORAL | Status: DC
  Administered 2019-10-27: 80 mg via ORAL
  Filled 2019-10-26: qty 2

## 2019-10-26 MED ORDER — SODIUM CHLORIDE 0.9 % IR SOLN
Status: DC | PRN
  Administered 2019-10-26: 1000 mL

## 2019-10-26 MED ORDER — MORPHINE SULFATE (PF) 2 MG/ML IV SOLN: 0.5000 mg | INTRAVENOUS | Status: DC | PRN

## 2019-10-26 MED ORDER — RIVAROXABAN 10 MG PO TABS
20.0000 mg | ORAL_TABLET | Freq: Every day | ORAL | Status: DC
  Administered 2019-10-26: 20 mg via ORAL
  Filled 2019-10-26: qty 2

## 2019-10-26 MED ORDER — MENTHOL 3 MG MT LOZG: 1.0000 | LOZENGE | OROMUCOSAL | Status: DC | PRN

## 2019-10-26 MED ORDER — TRIAMCINOLONE ACETONIDE 0.1 % EX OINT
1.0000 "application " | TOPICAL_OINTMENT | Freq: Two times a day (BID) | CUTANEOUS | Status: DC | PRN
  Filled 2019-10-26: qty 80

## 2019-10-26 MED ORDER — ONDANSETRON HCL 4 MG/2ML IJ SOLN: 4.0000 mg | Freq: Four times a day (QID) | INTRAMUSCULAR | Status: DC | PRN

## 2019-10-26 MED ORDER — FENTANYL CITRATE (PF) 100 MCG/2ML IJ SOLN
INTRAMUSCULAR | Status: AC
  Administered 2019-10-26: 50 ug
  Filled 2019-10-26: qty 2

## 2019-10-26 MED ORDER — PHENOL 1.4 % MT LIQD
1.0000 | OROMUCOSAL | Status: DC | PRN
  Filled 2019-10-26: qty 177

## 2019-10-26 MED ORDER — MIDAZOLAM HCL 2 MG/2ML IJ SOLN: 1.0000 mg | INTRAMUSCULAR | Status: DC

## 2019-10-26 MED ORDER — SUCCINYLCHOLINE CHLORIDE 200 MG/10ML IV SOSY
PREFILLED_SYRINGE | INTRAVENOUS | Status: DC | PRN
  Administered 2019-10-26: 120 mg via INTRAVENOUS

## 2019-10-26 MED ORDER — FENTANYL CITRATE (PF) 100 MCG/2ML IJ SOLN
INTRAMUSCULAR | Status: DC | PRN
  Administered 2019-10-26 (×3): 50 ug via INTRAVENOUS

## 2019-10-26 MED ORDER — CHLORHEXIDINE GLUCONATE 4 % EX LIQD: 60.0000 mL | Freq: Once | CUTANEOUS | Status: DC

## 2019-10-26 MED ORDER — FENTANYL CITRATE (PF) 100 MCG/2ML IJ SOLN: 25.0000 ug | INTRAMUSCULAR | Status: DC | PRN

## 2019-10-26 MED ORDER — ACETAMINOPHEN 325 MG PO TABS: 325.0000 mg | ORAL_TABLET | Freq: Four times a day (QID) | ORAL | Status: DC | PRN

## 2019-10-26 MED ORDER — TURMERIC 500 MG PO CAPS: 1000.0000 mg | ORAL_CAPSULE | Freq: Two times a day (BID) | ORAL | Status: DC

## 2019-10-26 MED ORDER — METHOCARBAMOL 500 MG PO TABS: 500.0000 mg | ORAL_TABLET | Freq: Four times a day (QID) | ORAL | Status: DC | PRN

## 2019-10-26 MED ORDER — ONDANSETRON HCL 4 MG PO TABS: 4.0000 mg | ORAL_TABLET | Freq: Four times a day (QID) | ORAL | Status: DC | PRN

## 2019-10-26 MED ORDER — BUPIVACAINE LIPOSOME 1.3 % IJ SUSP
INTRAMUSCULAR | Status: DC | PRN
  Administered 2019-10-26: 10 mL via PERINEURAL

## 2019-10-26 MED ORDER — ONDANSETRON HCL 4 MG/2ML IJ SOLN
INTRAMUSCULAR | Status: DC | PRN
  Administered 2019-10-26: 4 mg via INTRAVENOUS

## 2019-10-26 MED ORDER — FENTANYL CITRATE (PF) 100 MCG/2ML IJ SOLN: 50.0000 ug | INTRAMUSCULAR | Status: DC

## 2019-10-26 MED ORDER — NITROGLYCERIN 0.4 MG SL SUBL: 0.4000 mg | SUBLINGUAL_TABLET | SUBLINGUAL | Status: DC | PRN

## 2019-10-26 MED ORDER — EZETIMIBE 10 MG PO TABS
10.0000 mg | ORAL_TABLET | Freq: Every day | ORAL | Status: DC
  Administered 2019-10-27: 10 mg via ORAL
  Filled 2019-10-26: qty 1

## 2019-10-26 MED ORDER — CEFAZOLIN SODIUM-DEXTROSE 2-4 GM/100ML-% IV SOLN
2.0000 g | Freq: Four times a day (QID) | INTRAVENOUS | Status: AC
  Administered 2019-10-26 – 2019-10-27 (×3): 2 g via INTRAVENOUS
  Filled 2019-10-26 (×3): qty 100

## 2019-10-26 MED ORDER — LOSARTAN POTASSIUM 50 MG PO TABS
100.0000 mg | ORAL_TABLET | Freq: Every day | ORAL | Status: DC
  Administered 2019-10-27: 100 mg via ORAL
  Filled 2019-10-26: qty 2

## 2019-10-26 MED ORDER — VITAMIN D3 25 MCG (1000 UNIT) PO TABS
2000.0000 [IU] | ORAL_TABLET | Freq: Every day | ORAL | Status: DC
  Administered 2019-10-27: 2000 [IU] via ORAL
  Filled 2019-10-26: qty 2

## 2019-10-26 MED ORDER — LACTATED RINGERS IV SOLN: INTRAVENOUS | Status: DC

## 2019-10-26 MED ORDER — ONDANSETRON HCL 4 MG/2ML IJ SOLN: 4.0000 mg | Freq: Once | INTRAMUSCULAR | Status: DC | PRN

## 2019-10-26 MED ORDER — PROPOFOL 10 MG/ML IV BOLUS
INTRAVENOUS | Status: DC | PRN
  Administered 2019-10-26: 150 mg via INTRAVENOUS

## 2019-10-26 MED ORDER — METHOCARBAMOL 500 MG IVPB - SIMPLE MED
500.0000 mg | Freq: Four times a day (QID) | INTRAVENOUS | Status: DC | PRN
  Filled 2019-10-26: qty 50

## 2019-10-26 SURGICAL SUPPLY — 78 items
AID PSTN UNV HD RSTRNT DISP (MISCELLANEOUS) ×1
BAG SPEC THK2 15X12 ZIP CLS (MISCELLANEOUS) ×1
BAG ZIPLOCK 12X15 (MISCELLANEOUS) ×1 IMPLANT
BIT DRILL 1.6MX128 (BIT) IMPLANT
BIT DRILL 170X2.5X (BIT) IMPLANT
BIT DRL 170X2.5X (BIT) ×1
BLADE SAG 18X100X1.27 (BLADE) ×2 IMPLANT
CLSR STERI-STRIP ANTIMIC 1/2X4 (GAUZE/BANDAGES/DRESSINGS) ×1 IMPLANT
CONTROL EPI SZ1 (Orthopedic Implant) IMPLANT
COVER BACK TABLE 60X90IN (DRAPES) ×2 IMPLANT
COVER MAYO STAND STRL (DRAPES) ×1 IMPLANT
COVER SURGICAL LIGHT HANDLE (MISCELLANEOUS) ×2 IMPLANT
COVER WAND RF STERILE (DRAPES) IMPLANT
CTR EPI SZ1 (Orthopedic Implant) ×2 IMPLANT
DECANTER SPIKE VIAL GLASS SM (MISCELLANEOUS) ×2 IMPLANT
DRAPE 3/4 80X56 (DRAPES) ×1 IMPLANT
DRAPE INCISE IOBAN 66X45 STRL (DRAPES) ×2 IMPLANT
DRAPE ORTHO SPLIT 77X108 STRL (DRAPES) ×4
DRAPE SHEET LG 3/4 BI-LAMINATE (DRAPES) ×2 IMPLANT
DRAPE SURG ORHT 6 SPLT 77X108 (DRAPES) ×2 IMPLANT
DRAPE U-SHAPE 47X51 STRL (DRAPES) ×2 IMPLANT
DRILL 2.5 (BIT) ×2
DRSG ADAPTIC 3X8 NADH LF (GAUZE/BANDAGES/DRESSINGS) ×2 IMPLANT
DRSG PAD ABDOMINAL 8X10 ST (GAUZE/BANDAGES/DRESSINGS) ×2 IMPLANT
DURAPREP 26ML APPLICATOR (WOUND CARE) ×2 IMPLANT
ELECT BLADE TIP CTD 4 INCH (ELECTRODE) ×2 IMPLANT
ELECT NDL TIP 2.8 STRL (NEEDLE) ×1 IMPLANT
ELECT NEEDLE TIP 2.8 STRL (NEEDLE) ×2 IMPLANT
ELECT REM PT RETURN 15FT ADLT (MISCELLANEOUS) ×2 IMPLANT
GAUZE SPONGE 4X4 12PLY STRL (GAUZE/BANDAGES/DRESSINGS) ×2 IMPLANT
GLENOSPHERE XTEND RSA 38 SD +4 (Joint) ×1 IMPLANT
GLOVE BIOGEL PI ORTHO PRO 7.5 (GLOVE) ×1
GLOVE BIOGEL PI ORTHO PRO SZ8 (GLOVE) ×1
GLOVE ORTHO TXT STRL SZ7.5 (GLOVE) ×2 IMPLANT
GLOVE PI ORTHO PRO STRL 7.5 (GLOVE) ×1 IMPLANT
GLOVE PI ORTHO PRO STRL SZ8 (GLOVE) ×1 IMPLANT
GLOVE SURG ORTHO 8.5 STRL (GLOVE) ×2 IMPLANT
GOWN STRL REUS W/TWL XL LVL3 (GOWN DISPOSABLE) ×4 IMPLANT
KIT BASIN OR (CUSTOM PROCEDURE TRAY) ×2 IMPLANT
KIT TURNOVER KIT A (KITS) IMPLANT
MANIFOLD NEPTUNE II (INSTRUMENTS) ×2 IMPLANT
METAGLENE DELTA EXTEND (Trauma) IMPLANT
METAGLENE DXTEND (Trauma) ×2 IMPLANT
NDL MAYO CATGUT SZ4 TPR NDL (NEEDLE) IMPLANT
NEEDLE MAYO CATGUT SZ4 (NEEDLE) IMPLANT
NS IRRIG 1000ML POUR BTL (IV SOLUTION) ×2 IMPLANT
PACK SHOULDER (CUSTOM PROCEDURE TRAY) ×2 IMPLANT
PENCIL SMOKE EVACUATOR (MISCELLANEOUS) IMPLANT
PIN GUIDE 1.2 (PIN) ×1 IMPLANT
PIN GUIDE GLENOPHERE 1.5MX300M (PIN) ×1 IMPLANT
PIN METAGLENE 2.5 (PIN) ×1 IMPLANT
PROTECTOR NERVE ULNAR (MISCELLANEOUS) ×2 IMPLANT
RESTRAINT HEAD UNIVERSAL NS (MISCELLANEOUS) ×2 IMPLANT
SCREW 4.5X18MM (Screw) ×4 IMPLANT
SCREW BN 18X4.5XSTRL SHLDR (Screw) IMPLANT
SCREW LOCK 42 (Screw) ×1 IMPLANT
SCREW LOCK DELTA XTEND 4.5X30 (Screw) ×1 IMPLANT
SLING ARM FOAM STRAP LRG (SOFTGOODS) ×1 IMPLANT
SLING ARM FOAM STRAP MED (SOFTGOODS) ×1 IMPLANT
SMARTMIX MINI TOWER (MISCELLANEOUS)
SPACER 38 PLUS 3 (Spacer) ×1 IMPLANT
SPONGE LAP 4X18 RFD (DISPOSABLE) ×1 IMPLANT
STEM 12 HA (Stem) ×1 IMPLANT
STRIP CLOSURE SKIN 1/2X4 (GAUZE/BANDAGES/DRESSINGS) ×2 IMPLANT
SUCTION FRAZIER HANDLE 10FR (MISCELLANEOUS) ×1
SUCTION TUBE FRAZIER 10FR DISP (MISCELLANEOUS) ×1 IMPLANT
SUT FIBERWIRE #2 38 T-5 BLUE (SUTURE) ×4
SUT MNCRL AB 4-0 PS2 18 (SUTURE) ×2 IMPLANT
SUT VIC AB 0 CT1 36 (SUTURE) ×4 IMPLANT
SUT VIC AB 0 CT2 27 (SUTURE) ×2 IMPLANT
SUT VIC AB 2-0 CT1 27 (SUTURE) ×2
SUT VIC AB 2-0 CT1 TAPERPNT 27 (SUTURE) ×1 IMPLANT
SUTURE FIBERWR #2 38 T-5 BLUE (SUTURE) ×1 IMPLANT
TAPE PAPER 3X10 WHT MICROPORE (GAUZE/BANDAGES/DRESSINGS) ×1 IMPLANT
TOWEL OR 17X26 10 PK STRL BLUE (TOWEL DISPOSABLE) ×2 IMPLANT
TOWER SMARTMIX MINI (MISCELLANEOUS) IMPLANT
WATER STERILE IRR 1000ML POUR (IV SOLUTION) ×2 IMPLANT
YANKAUER SUCT BULB TIP 10FT TU (MISCELLANEOUS) ×2 IMPLANT

## 2019-10-26 NOTE — Care Management CC44 (Addendum)
Condition Code 44 Documentation Completed  Patient Details  Name: [name] MRN: [id number] Date of Birth: 1936/01/07   Condition Code 44 given:  Yes Patient signature on Condition Code 44 notice:  Yes Documentation of 2 MD's agreement:  Yes Code 44 added to claim:  Yes  Patient wanted TOC CM to contact husband via phone prior to her signing form to discuss with him. Contacted [employer] via phone and he gave patient ok to sign form.   Erenest Rasher, RN 10/26/2019, 5:01 PM

## 2019-10-26 NOTE — Progress Notes (Signed)
Assisted Dr. Oren Bracket with left, ultrasound guided, interscalene  block. Side rails up, monitors on throughout procedure. See vital signs in flow sheet. Tolerated Procedure well. exparel given also. Bracelet applied to patient

## 2019-10-26 NOTE — Anesthesia Procedure Notes (Signed)
Anesthesia Regional Block: Interscalene brachial plexus block   Pre-Anesthetic Checklist: ,, timeout performed, Correct Patient, Correct Site, Correct Laterality, Correct Procedure, Correct Position, site marked, Risks and benefits discussed,  Surgical consent,  Pre-op evaluation,  At surgeon's request and post-op pain management  Laterality: Right  Prep: chloraprep       Needles:  Injection technique: Single-shot  Needle Type: Echogenic Needle     Needle Length: 9cm  Needle Gauge: 21     Additional Needles:   Procedures:, nerve stimulator,,, ultrasound used (permanent image in chart),,,,   Nerve Stimulator or Paresthesia:  Response: deltoid and bicep, 0.5 mA,   Additional Responses:   Narrative:  Start time: 10/26/2019 9:13 AM End time: 10/26/2019 9:18 AM Injection made incrementally with aspirations every 5 mL.  Performed by: Personally  Anesthesiologist: Suzette Battiest, MD

## 2019-10-26 NOTE — Care Management Obs Status (Signed)
[place] NOTIFICATION   Patient Details  Name: [name] MRN: [id number] Date of Birth: [birth year]   Medicare Observation Status Notification Given:  Yes    Erenest Rasher, RN 10/26/2019, 5:01 PM

## 2019-10-26 NOTE — Op Note (Signed)
NAME: [name], [name] MEDICAL RECORD [id number] ACCOUNT [id number] DATE OF BIRTH:10-19-1935 FACILITY: [facility] LOCATION: [facility] PHYSICIAN:STEVEN Orlena Sheldon, MD  OPERATIVE REPORT  DATE OF PROCEDURE:  10/26/2019  PREOPERATIVE DIAGNOSIS:  Left shoulder rotator cuff tear arthropathy.  POSTOPERATIVE DIAGNOSIS:  Left shoulder rotator cuff tear arthropathy.  PROCEDURE PERFORMED:  Left reverse total shoulder arthroplasty using DePuy Delta Xtend prosthesis with no subscapularis repair.  ATTENDING SURGEON:  Esmond Plants MD  ASSISTANT:  Darol Destine, [place], who was scrubbed during the entire procedure and necessary for satisfactory completion of surgery.  ANESTHESIA:  General anesthesia was used plus interscalene block.  ESTIMATED BLOOD LOSS:  150 mL.  FLUID REPLACEMENT:  1500 mL crystalloid.  INSTRUMENT COUNTS:  Correct.  COMPLICATIONS:  No complications.  ANTIBIOTICS:  Perioperative antibiotics were given.  INDICATIONS:  The patient is an 84 year old female with worsening left shoulder pain and dysfunction secondary to rotator cuff insufficiency and bone-on-bone end-stage arthritis.  The patient despite conservative management has had worsening pain and  declining function and desires reverse shoulder replacement to restore function and eliminate pain to the shoulder.  Informed consent obtained.  DESCRIPTION OF PROCEDURE:  After an adequate level of anesthesia was achieved, the patient was positioned in modified beach chair position.  Left shoulder correctly identified and sterilely prepped and draped in the usual manner.  Time-out called,  verifying correct patient and correct site.  We entered the patient's shoulder using a standard deltopectoral incision, starting at the coracoid process, extending down to the anterior humerus.  Dissection down through subcutaneous tissues using Bovie.   We identified cephalic vein, took that laterally with the deltoid pectoralis taken  medially.  Conjoined tendon identified and retracted medially.  Deep retractors placed.  Biceps tenodesed in situ with 0 Vicryl figure-of-eight suture x2, incorporating  the pectoralis tendon.  We then released the subscapularis off the lesser tuberosity.  This was fairly stiff and tight and thin, likely not amenable to repair.  We did tag it for potential repair and released the inferior capsule, extending the shoulder  and we divided the remaining rotator cuff at the anterior portion, leaving the posterior infraspinatus and teres minor intact.  There was bone-on-bone arthritis noted.  We entered the proximal humerus with a 6 mm reamer, reaming up to a size 12  intramedullary.  We then placed our 12 mm guide and resected our head at 10 degrees of retroversion using the resection guide.  Once we had that cut made, we removed excess osteophytes with a rongeur.  We then subluxed the humerus posteriorly and then  gained good exposure of the glenoid.  There was bone-on-bone arthritis or eburnated bone noted, significant posterior wear.  We went ahead and removed the capsule and the labrum and also the biceps stump.  At this point, we placed our retractors, had  good exposure of the glenoid face, placed our guide pin, angling inferiorly and making up for the wear posteriorly and correcting for that.  We then reamed down the high side and reamed a little more eccentrically lower on the inferior side than the  superior side, angling down.  Once we had good bony preparation, we removed our guide pin.  We had done our peripheral hand reaming.  We then placed our metaglene baseplate and impacted that in position.  We secured the baseplate with a 42 inferior screw  and then a 30 superior screw and an 18 nonlocked anteriorly and posteriorly.  We locked our inferior and superior screws.  The baseplate security was outstanding.  We selected a 38+4 glenosphere and then secured that to the baseplate in the usual  manner.   Once that was secured, I did a finger sweep, making sure there was no soft tissue incorporated in that bearing and the axillary nerve was palpable and protected.  At this point, we completed our preparation on the humeral side with our reamer.   We reamed for the 1 left centered metaphysis and then we trialled with a 12 stem and a 1 left centered and placed that into the humeral side.  We had excellent bony support for this humeral implant.  We then selected a 38+3 poly trial and set that on the  tray laterally and then reduced the shoulder.  We were pleased with our soft tissue balancing.  Removed all trial components, irrigated thoroughly, selected the press-fit HA coated 12 stem with a 1 left centered and secured that at the 0 setting and  placed that in 10 degrees of retroversion using available bone graft from the head and the metaphyseal preparation with that bone graft.  We were able to get an impaction grafting technique, very secure, solid press-fit support and with that in place, we  selected the real 38+3 poly, impacted that onto the humeral tray.  We reduced the shoulder.  We were happy with our soft tissue balancing, range of motion, no impingement and nice tight conjoin, not too tight and no gapping with inferior pole or  external rotation.  We assessed the subscapularis, felt that this would limit her range of motion and thus, we resected the remnant.  We irrigated thoroughly and then closed the deltopectoral interval with 0 Vicryl suture, followed by 2-0 Vicryl for  subcutaneous closure and 4-0 Monocryl for skin.  Steri-Strips applied, followed by sterile dressing.  The patient tolerated the surgery well.  VN/NUANCE  D:10/26/2019 T:10/26/2019 JOB:[id number]

## 2019-10-26 NOTE — Interval H&P Note (Signed)
History and Physical Interval Note:  10/26/2019 9:48 AM  [name]  has presented today for surgery, with the diagnosis of Left shoulder osteoarthritis and rotator cuff insufficiency.  The various methods of treatment have been discussed with the patient and family. After consideration of risks, benefits and other options for treatment, the patient has consented to  Procedure(s) with comments: REVERSE SHOULDER ARTHROPLASTY (Left) - with interscalene block as a surgical intervention.  The patient's history has been reviewed, patient examined, no change in status, stable for surgery.  I have reviewed the patient's chart and labs.  Questions were answered to the patient's satisfaction.     [name]

## 2019-10-26 NOTE — Anesthesia Procedure Notes (Signed)
Procedure Name: Intubation Date/Time: 10/26/2019 10:38 AM Performed by: [place] Khushbu Pippen C, CRNA Pre-anesthesia Checklist: Patient identified, Emergency Drugs available, Suction available and Patient being monitored Patient Re-evaluated:Patient Re-evaluated prior to induction Oxygen Delivery Method: Circle system utilized Preoxygenation: Pre-oxygenation with 100% oxygen Induction Type: IV induction Ventilation: Mask ventilation without difficulty Laryngoscope Size: Mac and 3 Tube type: Oral Tube size: 7.0 mm Number of attempts: 1 Airway Equipment and Method: Stylet and Oral airway Placement Confirmation: ETT inserted through vocal cords under direct vision,  positive ETCO2 and breath sounds checked- equal and bilateral Secured at: 21 cm Tube secured with: Tape Dental Injury: Teeth and Oropharynx as per pre-operative assessment

## 2019-10-26 NOTE — Brief Op Note (Signed)
10/26/2019  12:19 PM  PATIENT:  [name]  84 y.o. female  PRE-OPERATIVE DIAGNOSIS:  Left shoulder osteoarthritis and rotator cuff insufficiency  POST-OPERATIVE DIAGNOSIS:  Left shoulder osteoarthritis and rotator cuff insufficiency  PROCEDURE:  Procedure(s) with comments: REVERSE SHOULDER ARTHROPLASTY (Left) - with interscalene block DePuy Delta Xtend, NO subscap repair  SURGEON:  Surgeon(s) and Role:    Netta Cedars, MD - Primary  PHYSICIAN ASSISTANT:   ASSISTANTS: [place] Bruns, PA-C   ANESTHESIA:   regional and general  EBL:  10 mL   BLOOD ADMINISTERED:none  DRAINS: none   LOCAL MEDICATIONS USED:  MARCAINE     SPECIMEN:  No Specimen  DISPOSITION OF SPECIMEN:  N/A  COUNTS:  YES  TOURNIQUET:  * No tourniquets in log *  DICTATION: .Other Dictation: Dictation Number [id number]  PLAN OF CARE: Admit to inpatient   PATIENT DISPOSITION:  PACU - hemodynamically stable.   Delay start of Pharmacological VTE agent (>24hrs) due to surgical blood loss or risk of bleeding: not applicable

## 2019-10-26 NOTE — Discharge Instructions (Signed)
Ice to the shoulder constantly.  Keep the incision covered and clean and dry for one week, then ok to get it wet in the shower. ° °Do exercise as instructed several times per day. ° °DO NOT reach behind your back or push up out of a chair with the operative arm. ° °Use a sling while you are up and around for comfort, may remove while seated.  Keep pillow propped behind the operative elbow. ° °Follow up with Dr Sabastien Tyler in two weeks in the office, call [phone] for appt °

## 2019-10-27 DIAGNOSIS — I25119 Atherosclerotic heart disease of native coronary artery with unspecified angina pectoris: Secondary | ICD-10-CM | POA: Diagnosis not present

## 2019-10-27 DIAGNOSIS — I5032 Chronic diastolic (congestive) heart failure: Secondary | ICD-10-CM | POA: Diagnosis not present

## 2019-10-27 DIAGNOSIS — I110 Hypertensive heart disease with heart failure: Secondary | ICD-10-CM | POA: Diagnosis not present

## 2019-10-27 DIAGNOSIS — M75102 Unspecified rotator cuff tear or rupture of left shoulder, not specified as traumatic: Secondary | ICD-10-CM | POA: Diagnosis not present

## 2019-10-27 DIAGNOSIS — I350 Nonrheumatic aortic (valve) stenosis: Secondary | ICD-10-CM | POA: Diagnosis not present

## 2019-10-27 DIAGNOSIS — Z853 Personal history of malignant neoplasm of breast: Secondary | ICD-10-CM | POA: Diagnosis not present

## 2019-10-27 LAB — BASIC METABOLIC PANEL
Anion gap: 10 (ref 5–15)
BUN: 28 mg/dL — ABNORMAL HIGH (ref 8–23)
CO2: 19 mmol/L — ABNORMAL LOW (ref 22–32)
Calcium: 8.8 mg/dL — ABNORMAL LOW (ref 8.9–10.3)
Chloride: 109 mmol/L (ref 98–111)
Creatinine, Ser: 0.92 mg/dL (ref 0.44–1.00)
GFR calc Af Amer: >60 mL/min (ref >60)
GFR calc non Af Amer: 58 mL/min — ABNORMAL LOW (ref >60)
Glucose, Bld: 139 mg/dL — ABNORMAL HIGH (ref 70–99)
Potassium: 4.6 mmol/L (ref 3.5–5.1)
Sodium: 138 mmol/L (ref 135–145)

## 2019-10-27 LAB — HEMOGLOBIN AND HEMATOCRIT, BLOOD
HCT: 38.2 % (ref 36.0–46.0)
Hemoglobin: 12.2 g/dL (ref 12.0–15.0)

## 2019-10-27 NOTE — Progress Notes (Signed)
JEIMMY MEWHINNEY  MRN: [id number] DOB/Age: [birth year] 82 y.o. [facility] Procedure: Procedure(s) (LRB): REVERSE SHOULDER ARTHROPLASTY (Left)     Subjective: Up in chair, no complaints. Block still effective  Vital Signs Temp:  [97.4 F (36.3 C)-98.6 F (37 C)] 98.2 F (36.8 C) (01/09 0441) Pulse Rate:  [68-113] 94 (01/09 0441) Resp:  [16-24] 18 (01/09 0441) BP: (116-176)/(70-97) 143/83 (01/09 0441) SpO2:  [91 %-100 %] 91 % (01/09 0441)  Lab Results Recent Labs    10/27/19 0431  HGB 12.2  HCT 38.2   BMET Recent Labs    10/27/19 0431  NA 138  K 4.6  CL 109  CO2 19*  GLUCOSE 139*  BUN 28*  CREATININE 0.92  CALCIUM 8.8*   INR  Date Value Ref Range Status  12/14/2016 1.32  Final     Exam Aquacel intact Block still in effect        Plan Dc home after OT today  Jenetta Loges PA-C  10/27/2019, 8:52 AM Contact # [phone]

## 2019-10-27 NOTE — Discharge Summary (Signed)
PATIENT ID:      [name]  MRN:     [id number] DOB/AGE:    1936/01/02 / 84 y.o.     DISCHARGE SUMMARY  ADMISSION DATE:    10/26/2019 DISCHARGE DATE:    ADMISSION DIAGNOSIS: Left shoulder osteoarthritis and rotator cuff insufficiency Past Medical History:  Diagnosis Date  . Anginal pain ([place])   . Aortic stenosis    severe AS with moderate AR by echo with normal LVF  . Arthritis   . CAD in native artery, non obstructive cath 11/2016.  12/16/2016  . Cancer [facility])    left breast mastectomy(cancer)-surgery only  . Chronic diastolic congestive heart failure ([place])   . Complication of anesthesia    extremely claustophobic(only occurs with narcotic medications as stated per pt)  . Diverticulosis   . Dysrhythmia    Afib  . Fibromyalgia   . GERD (gastroesophageal reflux disease)   . Hard of hearing    wears hearing aides   . History of breast cancer no recurrence   dx 2004   s/p  left breast mastectomy w/ node dissection/   no chemo or radiation  . History of TIA (transient ischemic attack)    per scan--  no residual  . Hyperlipidemia   . Hypertension   . Nocturia   . OSA on CPAP     severe/  AHI 34/hr  . Permanent atrial fibrillation (HCC)    on Xarelto for CHADS2VASC score of 5  . Pulmonary HTN ([place])    PASP 54mmHg by echo 11/2016  . Urge urinary incontinence   . Wears glasses     DISCHARGE DIAGNOSIS:   Active Problems:   H/O total shoulder replacement, left   PROCEDURE: Procedure(s): REVERSE SHOULDER ARTHROPLASTY on 10/26/2019  CONSULTS:    HISTORY:  See H&P in chart.  HOSPITAL COURSE:  [name] is a 84 y.o. admitted on 10/26/2019 with a diagnosis of Left shoulder osteoarthritis and rotator cuff insufficiency.  They were brought to the operating room on 10/26/2019 and underwent Procedure(s): [place].    They were given perioperative antibiotics:  Anti-infectives (From admission, onward)   Start     Dose/Rate Route Frequency Ordered Stop    10/26/19 1600  ceFAZolin (ANCEF) IVPB 2g/100 mL premix     2 g 200 mL/hr over 30 Minutes Intravenous Every 6 hours 10/26/19 1537 10/27/19 0453   10/26/19 0800  ceFAZolin (ANCEF) IVPB 2g/100 mL premix     2 g 200 mL/hr over 30 Minutes Intravenous On call to O.R. 10/26/19 [id number] 10/26/19 1042    .  Patient underwent the above named procedure and tolerated it well. The following day they were hemodynamically stable and pain was controlled on oral analgesics. They were neurovascularly intact to the operative extremity. OT was ordered and worked with patient per protocol. They were medically and orthopaedically stable for discharge on .    DIAGNOSTIC STUDIES:  RECENT RADIOGRAPHIC STUDIES :  DG Shoulder Left Port  Result Date: 10/26/2019 CLINICAL DATA:  Postoperative evaluation. EXAM: LEFT SHOULDER COMPARISON:  None. FINDINGS: A left shoulder prosthesis is seen. There is no evidence of surrounding lucency to suggest the presence of hardware loosening or infection. There is no evidence of fracture or dislocation. A moderate amount of soft tissue air is seen overlying the region superior to the left shoulder prosthesis and inferior to the left acromion. IMPRESSION: 1. No evidence of hardware complication status post left shoulder arthroplasty. 2. Moderate amount of soft  tissue air in the region inferior to the left acromion. Electronically Signed   By: Virgina Norfolk M.D.   On: 10/26/2019 16:27    RECENT VITAL SIGNS:   Patient Vitals for the past 24 hrs:  BP Temp Temp src Pulse Resp SpO2  10/27/19 0441 (!) 143/83 98.2 F (36.8 C) Oral 94 18 91 %  10/27/19 0120 116/83 98.2 F (36.8 C) Oral 88 16 93 %  10/26/19 2135 135/75 98.4 F (36.9 C) Oral 91 18 96 %  10/26/19 1746 137/74 97.7 F (36.5 C) Oral 87 16 93 %  10/26/19 1632 132/78 98 F (36.7 C) Oral 76 16 98 %  10/26/19 1529 (!) 140/96 98.2 F (36.8 C) Oral 78 16 96 %  10/26/19 1418 134/78 97.7 F (36.5 C) Oral 72 18 99 %  10/26/19  1315 126/88 98.6 F (37 C) - 68 19 97 %  10/26/19 1300 124/81 - - (!) 101 (!) 24 92 %  10/26/19 1245 - - - 77 (!) 24 96 %  10/26/19 1232 130/70 (!) 97.4 F (36.3 C) - 75 (!) 23 100 %  10/26/19 0927 - - - (!) 113 (!) 23 96 %  10/26/19 0926 - - - 88 19 97 %  10/26/19 0925 (!) 176/87 - - 90 (!) 21 96 %  10/26/19 0924 - - - (!) 101 (!) 23 97 %  10/26/19 0923 (!) 167/97 - - 91 20 99 %  10/26/19 0922 - - - 94 (!) 23 97 %  10/26/19 0921 - - - 94 20 97 %  10/26/19 0920 (!) 165/73 - - 93 20 100 %  10/26/19 0919 - - - 86 18 100 %  10/26/19 0918 - - - 84 18 100 %  10/26/19 0917 - - - 69 18 98 %  10/26/19 0916 - - - 85 18 100 %  .  RECENT EKG RESULTS:    Orders placed or performed during the hospital encounter of 10/24/19  . EKG 12 lead  . EKG 12 lead    DISCHARGE INSTRUCTIONS:    DISCHARGE MEDICATIONS:   Allergies as of 10/27/2019      Reactions   Beta Adrenergic Blockers Shortness Of Breath   Other    Narcotic pain medications - anxiety attacks   Oxycodone Shortness Of Breath, Other (See Comments)   Tolerates Dilaudid, Fentanyl   Sotalol Hcl Shortness Of Breath, Other (See Comments)   Tramadol Anxiety, Other (See Comments)   Can take if absolutely necessary   Clonidine Derivatives Palpitations   bradycardia   Crestor [rosuvastatin Calcium] Other (See Comments)   MYALGIAS   Latex Rash   Per patient-bandaids- no problems with gloves   Rosuvastatin Calcium Nausea Only   Tape Rash    bandaids       Medication List    TAKE these medications   acetaminophen 325 MG tablet Commonly known as: TYLENOL Take 650 mg by mouth 2 (two) times daily.   alendronate 70 MG tablet Commonly known as: FOSAMAX Take 70 mg by mouth every Saturday.   amoxicillin 500 MG capsule Commonly known as: AMOXIL Take 2,000 mg by mouth See admin instructions. Take 4 capsules (2000 mg) by mouth 1 hour prior to dental work   atorvastatin 80 MG tablet Commonly known as: LIPITOR Take 1 tablet (80 mg  total) by mouth daily.   diltiazem 300 MG 24 hr capsule Commonly known as: Cardizem CD Take 1 capsule (300 mg total) by mouth daily.  ezetimibe 10 MG tablet Commonly known as: ZETIA Take 1 tablet (10 mg total) by mouth daily.   Fiber-Caps 625 MG tablet Generic drug: polycarbophil Take 625 mg by mouth daily.   furosemide 20 MG tablet Commonly known as: LASIX Take 20 mg by mouth daily.   HYDROcodone-acetaminophen 5-325 MG tablet Commonly known as: Norco Take 1 tablet by mouth every 6 (six) hours as needed for moderate pain or severe pain.   losartan 100 MG tablet Commonly known as: COZAAR Take 1 tablet (100 mg total) by mouth daily.   Melatonin 10 MG Tabs Take 10 mg by mouth at bedtime as needed (for sleep).   nitroGLYCERIN 0.4 MG SL tablet Commonly known as: NITROSTAT Place 0.4 mg under the tongue every 5 (five) minutes as needed for chest pain.   omeprazole 20 MG capsule Commonly known as: PRILOSEC Take 20 mg by mouth daily as needed (acid reflux/indigestion.).   ondansetron 4 MG tablet Commonly known as: Zofran Take 1 tablet (4 mg total) by mouth every 8 (eight) hours as needed for nausea or vomiting.   predniSONE 5 MG tablet Commonly known as: DELTASONE Take 5 mg by mouth daily with breakfast.   QC Vitamin D3 50 MCG (2000 UT) Tabs Generic drug: Cholecalciferol Take 2,000 Units by mouth daily.   rivaroxaban 20 MG Tabs tablet Commonly known as: XARELTO Take 20 mg by mouth daily with supper.   triamcinolone ointment 0.1 % Commonly known as: KENALOG Apply 1 application topically 2 (two) times daily as needed (affected areas of feet).   TURMERIC PO Take 1,000 mg by mouth 2 (two) times daily.       FOLLOW UP VISIT:   Follow-up Information    Netta Cedars, MD. Call in 2 weeks.   Specialty: Orthopedic Surgery Why: [id number] Contact information: [address] [place] [address] [phone]           DISCHARGE TO:  Home    DISCHARGE CONDITION:  Thereasa Parkin Gina Costilla PA-C 10/27/2019, 8:51 AM

## 2019-10-27 NOTE — Plan of Care (Signed)
  Problem: Clinical Measurements: Goal: Cardiovascular complication will be avoided Outcome: Progressing   Problem: Activity: Goal: Risk for activity intolerance will decrease Outcome: Progressing   Problem: Nutrition: Goal: Adequate nutrition will be maintained Outcome: Progressing   Problem: Elimination: Goal: Will not experience complications related to bowel motility Outcome: Progressing   Problem: Pain Managment: Goal: General experience of comfort will improve Outcome: Progressing

## 2019-10-27 NOTE — Progress Notes (Signed)
Occupational Therapy Evaluation Patient Details Name: [name] MRN: [id number] DOB: Sep 19, 1936 Today's Date: 10/27/2019    History of Present Illness Pt is a 84 y.o. female with history of HTN, CAD, CHF, OSA, and tia. s/p left reverse shoulder arthroplasty.    Clinical Impression   PTA, pt was living at home with her husband and daughter, pt and her husband spend half the year in [place] and half the year in [place]. Pt reports she was independent with ADL/IADL and functional mobility without AD. Pt currently requires minA-modA for upper body dressing, minA for functional mobility with 1 person hand held support, pt has cane but it is in [place], reports she is able to purchase one for use in [place]. Reviewed shoulder precautions and exercises with provided handout. Discussed compensatory strategies for ADL due to shoulder limitations, pt with no additional questions at this time. Pt would benefit from outpatient OT services to progress her shoulder ROM to baseline. No additional acute needs identified at this time, all further OT needs can be met in next venue of care. OT will sign off, thank you for referral.     Follow Up Recommendations  Outpatient OT    Equipment Recommendations  None recommended by OT    Recommendations for Other Services       Precautions / Restrictions Precautions Precautions: Fall;Shoulder Type of Shoulder Precautions: AROM elbow, wrist, hand full range;AROM/PROM Forward flexion 0-90;AROM/PROM abduction 0-60; AROM/PROM external rotation 0-30 Shoulder Interventions: Shoulder sling/immobilizer;For comfort(for sleep) Precaution Booklet Issued: Yes (comment) Precaution Comments: reviewed shoulder precautions and exercises with handout Required Braces or Orthoses: Sling Restrictions Weight Bearing Restrictions: Yes LUE Weight Bearing: Non weight bearing      Mobility Bed Mobility               General bed mobility comments: pt sitting in  recliner upon arrival  Transfers Overall transfer level: Needs assistance Equipment used: 1 person hand held assist Transfers: Sit to/from Stand Sit to Stand: Min assist         General transfer comment: minA for stability;pt demonstrated preference for single UE support;    Balance Overall balance assessment: Mild deficits observed, not formally tested                                         ADL either performed or assessed with clinical judgement   ADL Overall ADL's : Needs assistance/impaired Eating/Feeding: Set up;Sitting   Grooming: Set up;Sitting   Upper Body Bathing: Minimal assistance;Sitting   Lower Body Bathing: Minimal assistance;Sit to/from stand   Upper Body Dressing : Minimal assistance;Sitting;Moderate assistance Upper Body Dressing Details (indicate cue type and reason): modA to don/doff sling Lower Body Dressing: Minimal assistance;Sit to/from stand   Toilet Transfer: Ambulation;Min guard(1 person hand held assistance) Armed forces technical officer Details (indicate cue type and reason): discussed use of cane to increase stability;pt has cane in [place], is able to purchase another one for use here Toileting- Water quality scientist and Hygiene: Min guard;Sit to/from stand Toileting - Clothing Manipulation Details (indicate cue type and reason): minguard for safety      Functional mobility during ADLs: Min guard;Minimal assistance General ADL Comments: limited use of RUE due to shoulder precautions and block present;minA for UB ADL;minA for 1 person hand held assistance, pt able to ambulate without assistance but expresses preference for single UE support;disuscussed use of cane to increase stability;reviewed compensatory  dressing strategies     Vision Baseline Vision/History: Wears glasses Patient Visual Report: No change from baseline       Perception     Praxis      Pertinent Vitals/Pain Pain Assessment: 0-10 Pain Score: 3  Pain Location:  Left shoulder Pain Descriptors / Indicators: Sore;Discomfort Pain Intervention(s): Limited activity within patient's tolerance;Monitored during session     Hand Dominance Right   Extremity/Trunk Assessment Upper Extremity Assessment Upper Extremity Assessment: LUE deficits/detail LUE Deficits / Details: ROM limited due to shoulder precautions;limited AROM as block has not worn off yet;digits 1 and 2 still numb;pt no AROM elbow;slight activation of wrist and digits LUE Sensation: decreased light touch LUE Coordination: decreased gross motor;decreased fine motor   Lower Extremity Assessment Lower Extremity Assessment: Overall WFL for tasks assessed   Cervical / Trunk Assessment Cervical / Trunk Assessment: Normal   Communication Communication Communication: No difficulties   Cognition Arousal/Alertness: Awake/alert Behavior During Therapy: WFL for tasks assessed/performed Overall Cognitive Status: Within Functional Limits for tasks assessed                                 General Comments: Pt with good carryover from previous R shoulder surgery;good awarenss of limitations and precautions   General Comments       Exercises Exercises: Shoulder Shoulder Exercises Shoulder Flexion: PROM;10 reps;Seated;Left(to 40degrees) Shoulder ABduction: PROM;10 reps;Seated;Left(to 15degrees) Elbow Flexion: PROM;10 reps;Left;Seated Wrist Flexion: AROM;10 reps;Left Digit Composite Flexion: AROM;10 reps;Left   Shoulder Instructions Shoulder Instructions Donning/doffing shirt without moving shoulder: Minimal assistance Method for sponge bathing under operated UE: Minimal assistance Donning/doffing sling/immobilizer: Moderate assistance Correct positioning of sling/immobilizer: Min-guard ROM for elbow, wrist and digits of operated UE: Supervision/safety Sling wearing schedule (on at all times/off for ADL's): Min-guard    Home Living Family/patient expects to be discharged to::  Private residence Living Arrangements: Spouse/significant other Available Help at Discharge: Family;Available 24 hours/day Type of Home: House Home Access: Stairs to enter [employer] of Steps: 3 Entrance Stairs-Rails: Right;Left;Can reach both Home Layout: Two level;Able to live on main level with bedroom/bathroom Alternate Level Stairs-Number of Steps: 16 Alternate Level Stairs-Rails: Can reach both;Right;Left Bathroom Shower/Tub: Occupational psychologist: Standard Bathroom Accessibility: Yes How Accessible: Accessible via walker Home Equipment: (has equipment but it is in [place])   Additional Comments: pt and her husband spend 1/2 year in [place] and 1/2 year in [place];they will be going back to [place] in May      Prior Functioning/Environment Level of Independence: Independent                 OT Problem List: Decreased strength;Decreased range of motion;Decreased activity tolerance;Decreased knowledge of precautions;Impaired sensation;Impaired UE functional use;Pain      OT Treatment/Interventions:      OT Goals(Current goals can be found in the care plan section) Acute Rehab OT Goals Patient Stated Goal: to get back to level of independence OT Goal Formulation: With patient Time For Goal Achievement: 11/10/19 Potential to Achieve Goals: Good  OT Frequency:     Barriers to D/C:            Co-evaluation              AM-PAC OT "6 Clicks" Daily Activity     Outcome Measure Help from another person eating meals?: A Little Help from another person taking care of personal grooming?: A Little Help from another person toileting,  which includes using toliet, bedpan, or urinal?: A Little Help from another person bathing (including washing, rinsing, drying)?: A Little Help from another person to put on and taking off regular upper body clothing?: A Lot Help from another person to put on and taking off regular lower body clothing?: A  Little 6 Click Score: 17   End of Session Equipment Utilized During Treatment: Gait belt(sling) Nurse Communication: Mobility status  Activity Tolerance: Patient tolerated treatment well Patient left: in chair;with call bell/phone within reach  OT Visit Diagnosis: Pain;Muscle weakness (generalized) (M62.81) Pain - Right/Left: Left Pain - part of body: Shoulder                Time: 2637-8588 OT Time Calculation (min): 22 min Charges:  OT General Charges $OT Visit: 1 Visit OT Evaluation $OT Eval Moderate Complexity: [place] OTR/L Acute Rehabilitation Services Office: [place] 10/27/2019, 11:16 AM

## 2019-10-27 NOTE — Progress Notes (Signed)
Pt stable at time of d/c instructions and education. Pt reported no pain or complications at this time. Dressing remains normal and intact. Pt needed no home equipment prior to d/c. Pt to followup with dr. Veverly Fells in two weeks.

## 2019-10-29 ENCOUNTER — Encounter: Payer: Self-pay | Admitting: *Deleted

## 2019-10-29 NOTE — Transfer of Care (Signed)
Immediate Anesthesia Transfer of Care Note  Patient: [name]  Procedure(s) Performed: REVERSE SHOULDER ARTHROPLASTY (Left Shoulder)  Patient Location: PACU  Anesthesia Type:General  Level of Consciousness: awake, alert  and oriented  Airway & Oxygen Therapy: Patient Spontanous Breathing and Patient connected to face mask oxygen  Post-op Assessment: Report given to RN and Post -op Vital signs reviewed and stable  Post vital signs: Reviewed and stable  Last Vitals:  Vitals Value Taken Time  BP 128/79 10/27/19 0951  Temp 36.9 C 10/27/19 0951  Pulse 97 10/27/19 0951  Resp 16 10/27/19 0951  SpO2 97 % 10/27/19 0951    Last Pain:  Vitals:   10/27/19 0951  TempSrc: Oral  PainSc:          Complications: No apparent anesthesia complications

## 2019-10-30 NOTE — Anesthesia Postprocedure Evaluation (Signed)
Anesthesia Post Note  Patient: [name]  Procedure(s) Performed: REVERSE SHOULDER ARTHROPLASTY (Left Shoulder)     Patient location during evaluation: PACU Anesthesia Type: General Level of consciousness: awake and alert Pain management: pain level controlled Vital Signs Assessment: post-procedure vital signs reviewed and stable Respiratory status: spontaneous breathing, nonlabored ventilation, respiratory function stable and patient connected to nasal cannula oxygen Cardiovascular status: blood pressure returned to baseline and stable Postop Assessment: no apparent nausea or vomiting Anesthetic complications: no    Last Vitals:  Vitals:   10/27/19 0441 10/27/19 0951  BP: (!) 143/83 128/79  Pulse: 94 97  Resp: 18 16  Temp: 36.8 C 36.9 C  SpO2: 91% 97%    Last Pain:  Vitals:   10/27/19 0951  TempSrc: Oral  PainSc:                  [name]

## 2019-10-31 DIAGNOSIS — F419 Anxiety disorder, unspecified: Secondary | ICD-10-CM | POA: Diagnosis not present

## 2019-11-08 DIAGNOSIS — Z4789 Encounter for other orthopedic aftercare: Secondary | ICD-10-CM | POA: Diagnosis not present

## 2019-11-16 ENCOUNTER — Ambulatory Visit: Payer: Medicare Other

## 2019-11-24 ENCOUNTER — Ambulatory Visit: Payer: Medicare Other | Attending: Internal Medicine

## 2019-11-24 DIAGNOSIS — Z23 Encounter for immunization: Secondary | ICD-10-CM

## 2019-11-24 NOTE — Progress Notes (Signed)
   Covid-19 Vaccination Clinic  Name:  [name]    MRN: [id number] DOB: August 29, 1936  11/24/2019  [name] was observed post Covid-19 immunization for 15 minutes without incidence. She was provided with Vaccine Information Sheet and instruction to access the V-Safe system.   [name] was instructed to call 911 with any severe reactions post vaccine: Marland Kitchen Difficulty breathing  . Swelling of your face and throat  . A fast heartbeat  . A bad rash all over your body  . Dizziness and weakness    Immunizations Administered    Name Date Dose VIS Date Route   Pfizer COVID-19 Vaccine 11/24/2019  4:13 PM 0.3 mL 09/28/2019 Intramuscular   Manufacturer: [place]   Lot: [id number]   [place]: [id number]

## 2019-11-27 ENCOUNTER — Ambulatory Visit: Payer: Medicare Other

## 2019-12-05 DIAGNOSIS — M810 Age-related osteoporosis without current pathological fracture: Secondary | ICD-10-CM | POA: Diagnosis not present

## 2019-12-05 DIAGNOSIS — F419 Anxiety disorder, unspecified: Secondary | ICD-10-CM | POA: Diagnosis not present

## 2019-12-05 DIAGNOSIS — I10 Essential (primary) hypertension: Secondary | ICD-10-CM | POA: Diagnosis not present

## 2019-12-19 ENCOUNTER — Ambulatory Visit: Payer: Medicare Other | Attending: Internal Medicine

## 2019-12-19 DIAGNOSIS — Z23 Encounter for immunization: Secondary | ICD-10-CM | POA: Insufficient documentation

## 2019-12-19 NOTE — Progress Notes (Signed)
   Covid-19 Vaccination Clinic  Name:  NAYA STRANO    MRN: [id number] DOB: 11-01-1935  12/19/2019  Ms. Impastato was observed post Covid-19 immunization for 15 minutes without incident. She was provided with Vaccine Information Sheet and instruction to access the V-Safe system.   Ms. Prante was instructed to call 911 with any severe reactions post vaccine: Marland Kitchen Difficulty breathing  . Swelling of face and throat  . A fast heartbeat  . A bad rash all over body  . Dizziness and weakness   Immunizations Administered    Name Date Dose VIS Date Route   Pfizer COVID-19 Vaccine 12/19/2019 12:39 PM 0.3 mL 09/28/2019 Intramuscular   Manufacturer: [place]   Lot: [id number]   [place]: [id number]

## 2019-12-26 DIAGNOSIS — Z4789 Encounter for other orthopedic aftercare: Secondary | ICD-10-CM | POA: Diagnosis not present

## 2020-01-31 ENCOUNTER — Other Ambulatory Visit: Payer: Self-pay

## 2020-01-31 ENCOUNTER — Ambulatory Visit (INDEPENDENT_AMBULATORY_CARE_PROVIDER_SITE_OTHER): Payer: Medicare Other | Admitting: Cardiology

## 2020-01-31 ENCOUNTER — Encounter: Payer: Self-pay | Admitting: Cardiology

## 2020-01-31 VITALS — BP 114/66 | HR 68 | Ht 67.0 in | Wt 191.2 lb

## 2020-01-31 DIAGNOSIS — G4733 Obstructive sleep apnea (adult) (pediatric): Secondary | ICD-10-CM

## 2020-01-31 DIAGNOSIS — I5032 Chronic diastolic (congestive) heart failure: Secondary | ICD-10-CM

## 2020-01-31 DIAGNOSIS — I2510 Atherosclerotic heart disease of native coronary artery without angina pectoris: Secondary | ICD-10-CM

## 2020-01-31 DIAGNOSIS — I4821 Permanent atrial fibrillation: Secondary | ICD-10-CM

## 2020-01-31 DIAGNOSIS — I10 Essential (primary) hypertension: Secondary | ICD-10-CM | POA: Diagnosis not present

## 2020-01-31 DIAGNOSIS — I272 Pulmonary hypertension, unspecified: Secondary | ICD-10-CM | POA: Diagnosis not present

## 2020-01-31 DIAGNOSIS — I350 Nonrheumatic aortic (valve) stenosis: Secondary | ICD-10-CM

## 2020-01-31 DIAGNOSIS — E7800 Pure hypercholesterolemia, unspecified: Secondary | ICD-10-CM

## 2020-01-31 NOTE — Patient Instructions (Signed)
Medication Instructions:  Your physician recommends that you continue on your current medications as directed. Please refer to the Current Medication list given to you today.  *If you need a refill on your cardiac medications before your next appointment, please call your pharmacy*   Lab Work: Fasting Lipids and CMET  If you have labs (blood work) drawn today and your tests are completely normal, you will receive your results only by: Marland Kitchen MyChart Message (if you have MyChart) OR . A paper copy in the mail If you have any lab test that is abnormal or we need to change your treatment, we will call you to review the results.  Follow-Up: At [facility], you and your health needs are our priority.  As part of our continuing mission to provide you with exceptional heart care, we have created designated Provider Care Teams.  These Care Teams include your primary Cardiologist (physician) and Advanced Practice Providers (APPs -  Physician Assistants and Nurse Practitioners) who all work together to provide you with the care you need, when you need it.  We recommend signing up for the patient portal called "MyChart".  Sign up information is provided on this After Visit Summary.  MyChart is used to connect with patients for Virtual Visits (Telemedicine).  Patients are able to view lab/test results, encounter notes, upcoming appointments, etc.  Non-urgent messages can be sent to your provider as well.   To learn more about what you can do with MyChart, go to [email or web address].    Your next appointment:   1 year(s)  The format for your next appointment:   In Person  Provider:   You may see Fransico Him, MD or one of the following Advanced Practice Providers on your designated Care Team:    Melina Copa, PA-C  Ermalinda Barrios, PA-C

## 2020-01-31 NOTE — Progress Notes (Signed)
Cardiology Office Note:    Date:  01/31/2020   ID:  [name], DOB 21-Sep-1936, MRN [id number]  PCP:  Maurice Small, MD  Cardiologist:  Fransico Him, MD    Referring MD: Maurice Small, MD   Chief Complaint  Patient presents with  . Aortic Stenosis  . Coronary Artery Disease  . Hyperlipidemia  . Congestive Heart Failure  . Hypertension  . Sleep Apnea    History of Present Illness:    [name] is a 84 y.o. female with a hx of chronic Afib (on xarelto), HTN, chronic diastolic heart failure, aortic stenosis, HLD, obesity, GERD, and breast cancer.  She has a hx of CAD with cath in 2018 showing 70% focal ostial stenosis of the first diagonal branch of the left anterior descending coronary artery, 30-40% stenosis of the proximal right coronary artery, and otherwise very mild nonobstructive coronary artery disease. Medical management was recommended.  There was mild pulmonary hypertension.She underwent TAVR with 62mm Edwards Sapien 3 bioprosthesis 11/2016. She also has OSA and is on CPAP.    She is here today for followup and is doing well.  She denies any chest pain or pressure, SOB, DOE, PND, orthopnea, LE edema, dizziness, palpitations or syncope. She is compliant with her meds and is tolerating meds with no SE.  She is doing well with her CPAP device and thinks that she has gotten used to it.  She tolerates the mask and feels the pressure is adequate.  Since going on CPAP she feels rested in the am and has no significant daytime sleepiness.  She denies any significant mouth or nasal dryness or nasal congestion.  She does not think that he snores.     Past Medical History:  Diagnosis Date  . Anginal pain ([place])   . Aortic stenosis    severe AS with moderate AR by echo with normal LVF  . Arthritis   . CAD in native artery, non obstructive cath 11/2016.  12/16/2016  . Cancer [facility])    left breast mastectomy(cancer)-surgery only  . Chronic diastolic congestive heart failure  ([place])   . Complication of anesthesia    extremely claustophobic(only occurs with narcotic medications as stated per pt)  . Diverticulosis   . Dysrhythmia    Afib  . Fibromyalgia   . GERD (gastroesophageal reflux disease)   . Hard of hearing    wears hearing aides   . History of breast cancer no recurrence   dx 2004   s/p  left breast mastectomy w/ node dissection/   no chemo or radiation  . History of TIA (transient ischemic attack)    per scan--  no residual  . Hyperlipidemia   . Hypertension   . Nocturia   . OSA on CPAP     severe/  AHI 34/hr  . Permanent atrial fibrillation (HCC)    on Xarelto for CHADS2VASC score of 5  . Pulmonary HTN ([place])    PASP 75mmHg by echo 11/2016  . Urge urinary incontinence   . Wears glasses     Past Surgical History:  Procedure Laterality Date  . ABDOMINOPLASTY  1987  . APPENDECTOMY  1965  . BREAST SURGERY Left 2004   mastectomy   . CARDIAC CATHETERIZATION N/A 11/19/2016   Procedure: Right/Left Heart Cath and Coronary Angiography;  Surgeon: Troy Sine, MD;  Location: [facility];  Service: Cardiovascular;  Laterality: N/A;  . CARPAL TUNNEL RELEASE Right 07-08-2004  . COLONOSCOPY WITH ESOPHAGOGASTRODUODENOSCOPY (EGD)  2006  . EXPLANTATION BREAST IMPLANT AND DEBRIDEMENT Left 09/15/2005  . INTERSTIM IMPLANT PLACEMENT N/A 08/22/2014   Procedure: Barrie Lyme IMPLANT STAGE ONE/TWO;  Surgeon: Reece Packer, MD;  Location: [facility];  Service: Urology;  Laterality: N/A;  . JOINT REPLACEMENT Left 2017   knee replacement  . MASTECTOMY    . MASTECTOMY, MODIFIED RADICAL W/RECONSTRUCTION  09-16-2003   left breast W/ SLN DISSECTION (pt states had 13 operations for implant infection   . REPLACE TISSUE EXPANDER LEFT BREAST AND TOTAL CAPSULECTOMY  08-05-2006  . REVERSE SHOULDER ARTHROPLASTY Left 10/26/2019   Procedure: REVERSE SHOULDER ARTHROPLASTY;  Surgeon: Netta Cedars, MD;  Location: [facility];  Service: Orthopedics;  Laterality:  Left;  with interscalene block  . REVISION BREAST RECONSTRUCTION Left 11-11-2004   12-03-2004  DRAINAGE SEREMA LEFT CHEST  . TEE WITHOUT CARDIOVERSION N/A 12/14/2016   Procedure: TRANSESOPHAGEAL ECHOCARDIOGRAM (TEE);  Surgeon: Burnell Blanks, MD;  Location: [place];  Service: Open Heart Surgery;  Laterality: N/A;  . TOTAL HIP ARTHROPLASTY Left 07/17/2013   Procedure: LEFT TOTAL HIP ARTHROPLASTY ANTERIOR APPROACH;  Surgeon: Mauri Pole, MD;  Location: [facility];  Service: Orthopedics;  Laterality: Left;  . TOTAL HIP ARTHROPLASTY Right 01/28/2015   Procedure: RIGHT TOTAL HIP ARTHROPLASTY ANTERIOR APPROACH;  Surgeon: Paralee Cancel, MD;  Location: [facility];  Service: Orthopedics;  Laterality: Right;  . TOTAL KNEE ARTHROPLASTY Left 11/11/2015   Procedure: LEFT TOTAL KNEE ARTHROPLASTY;  Surgeon: Paralee Cancel, MD;  Location: [facility];  Service: Orthopedics;  Laterality: Left;  . TOTAL SHOULDER ARTHROPLASTY  08/11/2012   Procedure: TOTAL SHOULDER ARTHROPLASTY;  Surgeon: Augustin Schooling, MD;  Location: [place];  Service: Orthopedics;  Laterality: Right;  RIGHT  TOTAL SHOULDER  ARTHROPLASTY   . TRANSCATHETER AORTIC VALVE REPLACEMENT, TRANSFEMORAL N/A 12/14/2016   Procedure: TRANSCATHETER AORTIC VALVE REPLACEMENT, TRANSFEMORAL;  Surgeon: Burnell Blanks, MD;  Location: [place];  Service: Open Heart Surgery;  Laterality: N/A;  . TRANSTHORACIC ECHOCARDIOGRAM  02-25-2014   moderate LVH/  ef 55-60%/  mod. to sev. calcification AV with mild to moderate AV stenosis/  mild to mod. AR and MR/ mild PR/   moderate to severe LAE  . TUBAL LIGATION  1973    Current Medications: Current Meds  Medication Sig  . alendronate (FOSAMAX) 70 MG tablet Take 70 mg by mouth every Saturday.  Marland Kitchen amoxicillin (AMOXIL) 500 MG capsule Take 2,000 mg by mouth See admin instructions. Take 4 capsules (2000 mg) by mouth 1 hour prior to dental work  . atorvastatin (LIPITOR) 80 MG tablet Take 1 tablet (80 mg total) by mouth daily.  Marland Kitchen  diltiazem (CARDIZEM CD) 300 MG 24 hr capsule Take 1 capsule (300 mg total) by mouth daily.  Marland Kitchen ezetimibe (ZETIA) 10 MG tablet Take 1 tablet (10 mg total) by mouth daily.  . furosemide (LASIX) 20 MG tablet Take 20 mg by mouth daily.   . hydrOXYzine (ATARAX/VISTARIL) 25 MG tablet Take 25 mg by mouth as directed.  Marland Kitchen losartan (COZAAR) 100 MG tablet Take 1 tablet (100 mg total) by mouth daily.  . Melatonin 10 MG TABS Take 10 mg by mouth at bedtime as needed (for sleep).   . nitroGLYCERIN (NITROSTAT) 0.4 MG SL tablet Place 0.4 mg under the tongue every 5 (five) minutes as needed for chest pain.   Marland Kitchen omeprazole (PRILOSEC) 20 MG capsule Take 20 mg by mouth daily as needed (acid reflux/indigestion.).   Marland Kitchen ondansetron (ZOFRAN) 4 MG tablet Take 1 tablet (  4 mg total) by mouth every 8 (eight) hours as needed for nausea or vomiting.  . predniSONE (DELTASONE) 5 MG tablet Take 5 mg by mouth daily with breakfast.  . rivaroxaban (XARELTO) 20 MG TABS tablet Take 20 mg by mouth daily with supper.  . triamcinolone ointment (KENALOG) 0.1 % Apply 1 application topically 2 (two) times daily as needed (affected areas of feet).   . TURMERIC PO Take 1,000 mg by mouth 2 (two) times daily.     Allergies:   Beta adrenergic blockers, Other, Oxycodone, Sotalol hcl, Tramadol, Clonidine derivatives, Crestor [rosuvastatin calcium], Latex, Rosuvastatin calcium, Rosuvastatin, and Tape   Social History   Socioeconomic History  . Marital status: Married    Spouse name: Not on file  . Number of children: Not on file  . Years of education: Not on file  . Highest education level: Not on file  Occupational History  . Not on file  Tobacco Use  . Smoking status: Former Smoker    Packs/day: 0.25    Years: 8.00    Pack years: 2.00    Types: Cigarettes    Quit date: 10/18/1966    Years since quitting: 53.3  . Smokeless tobacco: Never Used  Substance and Sexual Activity  . Alcohol use: No  . Drug use: No  . Sexual activity: Yes   Other Topics Concern  . Not on file  Social History Narrative  . Not on file   Social Determinants of Health   Financial Resource Strain:   . Difficulty of Paying Living Expenses:   Food Insecurity:   . Worried About Charity fundraiser in the Last Year:   . Arboriculturist in the Last Year:   Transportation Needs:   . Film/video editor (Medical):   Marland Kitchen Lack of Transportation (Non-Medical):   Physical Activity:   . Days of Exercise per Week:   . Minutes of Exercise per Session:   Stress:   . Feeling of Stress :   Social Connections:   . Frequency of Communication with Friends and Family:   . Frequency of Social Gatherings with Friends and Family:   . Attends Religious Services:   . Active Member of Clubs or Organizations:   . Attends Archivist Meetings:   Marland Kitchen Marital Status:      Family History: The patient's family history includes Heart disease in her father and mother.  ROS:   Please see the history of present illness.    ROS  All other systems reviewed and negative.   EKGs/Labs/Other Studies Reviewed:    The following studies were reviewed today: PAP compliance downlad  EKG:  EKG is not ordered today.    Recent Labs: 08/28/2019: ALT 27 10/24/2019: Platelets 217 10/27/2019: BUN 28; Creatinine, Ser 0.92; Hemoglobin 12.2; Potassium 4.6; Sodium 138   Recent Lipid Panel    Component Value Date/Time   CHOL 161 08/28/2019 0728   TRIG 114 08/28/2019 0728   HDL 84 08/28/2019 0728   CHOLHDL 1.9 08/28/2019 0728   LDLCALC 57 08/28/2019 0728    Physical Exam:    VS:  There were no vitals taken for this visit.    Wt Readings from Last 3 Encounters:  10/26/19 204 lb (92.5 kg)  10/24/19 204 lb (92.5 kg)  08/08/19 191 lb (86.6 kg)     GEN:  Well nourished, well developed in no acute distress HEENT: Normal NECK: No JVD; No carotid bruits LYMPHATICS: No lymphadenopathy CARDIAC: RRR, no murmurs,  rubs, gallops RESPIRATORY:  Clear to auscultation  without rales, wheezing or rhonchi  ABDOMEN: Soft, non-tender, non-distended MUSCULOSKELETAL:  No edema; No deformity  SKIN: Warm and dry NEUROLOGIC:  Alert and oriented x 3 PSYCHIATRIC:  Normal affect   ASSESSMENT:    1. Severe aortic stenosis   2. Coronary artery disease involving native coronary artery of native heart without angina pectoris   3. Chronic diastolic congestive heart failure ([place])   4. Hypercholesteremia   5. Essential hypertension   6. OSA (obstructive sleep apnea)   7. Permanent atrial fibrillation ([place])   8. Pulmonary HTN ([place])    PLAN:    In order of problems listed above:  1.Aortic stenosis -s/p TAVR with 17mm Edwards Sapien 3 bioprosthesis 11/2016.  -doing well -echo 08/2019 showed normal LVF with stable TAVR and no PVL  2. ASCAD -cath 2018 with  70% focal ostial stenosis of the first diagonal branch of the left anterior descending coronary artery, 30-40% stenosis of the proximal right coronary artery, and otherwise very mild nonobstructive coronary artery disease.  -she denies any anginal sx -continue statin.   -no ASA due to DOAC for afib.  3. Chronic diastolic CHF -she appears euvolemic -weight is stable -continue PRN lasix  4. Hyperlipidemia -LDL goal < 70 -LDL was 86 in June -repeat FLP and ALT -continue atorvastatin 80mg  daily and Zetia 10mg  daily  5. Hypertension -BP controlled -continue Cardizem CD 300mg  daily and Losartan 100mg  daily -Creatinine was stable at 0.92 in Jan 2021  6.OSA -  The patient is tolerating PAP therapy well without any problems. The PAP download was reviewed today and showed an AHI of 14.9/hr on 8 cm H2O with 51% compliance in using more than 4 hours nightly.  The patient has been using and benefiting from PAP use and will continue to benefit from therapy. I have encouraged her to be more compliant as well as avoid sleeping supine. I will get her DME to check her mask  7. Chronic atrial  fibrillation -HR remains well controlled -denies any bleeding problems on Xarelto -continue Cardizem CD 300mg  daily for rate control -creatinine 0.92 and Hbg 12.2 in Jan 2021  8. Pulmonary HTN - Moderate by echo 01/2018 with PASP 68mmHg -repeat 2D echo 08/2019 with PASP 73mmHg    Medication Adjustments/Labs and Tests Ordered: Current medicines are reviewed at length with the patient today.  Concerns regarding medicines are outlined above.  No orders of the defined types were placed in this encounter.  No orders of the defined types were placed in this encounter.   Signed, Fransico Him, MD  01/31/2020 1:06 PM    [place]

## 2020-02-01 ENCOUNTER — Telehealth: Payer: Self-pay | Admitting: Cardiology

## 2020-02-01 NOTE — Telephone Encounter (Signed)
New message:    Patient calling concerning her CP that was left yesterday. Please call patient she would like to pick it up.

## 2020-02-01 NOTE — Telephone Encounter (Signed)
Patient is going to come by today and pick up her chip.

## 2020-02-04 NOTE — Telephone Encounter (Signed)
Personally gave the patient her chip back Friday 02/01/20.

## 2020-02-05 ENCOUNTER — Other Ambulatory Visit: Payer: Medicare Other

## 2020-02-05 ENCOUNTER — Other Ambulatory Visit: Payer: Self-pay

## 2020-02-05 DIAGNOSIS — E7800 Pure hypercholesterolemia, unspecified: Secondary | ICD-10-CM | POA: Diagnosis not present

## 2020-02-05 DIAGNOSIS — I10 Essential (primary) hypertension: Secondary | ICD-10-CM

## 2020-02-05 LAB — COMPREHENSIVE METABOLIC PANEL
ALT: 18 IU/L (ref 0–32)
AST: 24 IU/L (ref 0–40)
Albumin/Globulin Ratio: 1.9 (ref 1.2–2.2)
Albumin: 4.5 g/dL (ref 3.6–4.6)
Alkaline Phosphatase: 72 IU/L (ref 39–117)
BUN/Creatinine Ratio: 22 (ref 12–28)
BUN: 19 mg/dL (ref 8–27)
Bilirubin Total: 0.5 mg/dL (ref 0.0–1.2)
CO2: 22 mmol/L (ref 20–29)
Calcium: 9.7 mg/dL (ref 8.7–10.3)
Chloride: 106 mmol/L (ref 96–106)
Creatinine, Ser: 0.86 mg/dL (ref 0.57–1.00)
GFR calc Af Amer: 73 mL/min/{1.73_m2} (ref >59)
GFR calc non Af Amer: 63 mL/min/{1.73_m2} (ref >59)
Globulin, Total: 2.4 g/dL (ref 1.5–4.5)
Glucose: 87 mg/dL (ref 65–99)
Potassium: 4.4 mmol/L (ref 3.5–5.2)
Sodium: 146 mmol/L — ABNORMAL HIGH (ref 134–144)
Total Protein: 6.9 g/dL (ref 6.0–8.5)

## 2020-02-05 LAB — LIPID PANEL
Chol/HDL Ratio: 1.9 ratio (ref 0.0–4.4)
Cholesterol, Total: 125 mg/dL (ref 100–199)
HDL: 67 mg/dL (ref >39)
LDL Chol Calc (NIH): 42 mg/dL (ref 0–99)
Triglycerides: 83 mg/dL (ref 0–149)
VLDL Cholesterol Cal: 16 mg/dL (ref 5–40)

## 2020-02-06 DIAGNOSIS — Z7952 Long term (current) use of systemic steroids: Secondary | ICD-10-CM | POA: Diagnosis not present

## 2020-02-06 DIAGNOSIS — M154 Erosive (osteo)arthritis: Secondary | ICD-10-CM | POA: Diagnosis not present

## 2020-02-06 DIAGNOSIS — M150 Primary generalized (osteo)arthritis: Secondary | ICD-10-CM | POA: Diagnosis not present

## 2020-02-06 DIAGNOSIS — Z6830 Body mass index (BMI) 30.0-30.9, adult: Secondary | ICD-10-CM | POA: Diagnosis not present

## 2020-02-06 DIAGNOSIS — E669 Obesity, unspecified: Secondary | ICD-10-CM | POA: Diagnosis not present

## 2020-02-06 DIAGNOSIS — M064 Inflammatory polyarthropathy: Secondary | ICD-10-CM | POA: Diagnosis not present

## 2020-02-07 DIAGNOSIS — D692 Other nonthrombocytopenic purpura: Secondary | ICD-10-CM | POA: Diagnosis not present

## 2020-02-07 DIAGNOSIS — L723 Sebaceous cyst: Secondary | ICD-10-CM | POA: Diagnosis not present

## 2020-02-07 DIAGNOSIS — D1801 Hemangioma of skin and subcutaneous tissue: Secondary | ICD-10-CM | POA: Diagnosis not present

## 2020-02-07 DIAGNOSIS — L570 Actinic keratosis: Secondary | ICD-10-CM | POA: Diagnosis not present

## 2020-02-07 DIAGNOSIS — L821 Other seborrheic keratosis: Secondary | ICD-10-CM | POA: Diagnosis not present

## 2020-02-07 DIAGNOSIS — L814 Other melanin hyperpigmentation: Secondary | ICD-10-CM | POA: Diagnosis not present

## 2020-03-02 DIAGNOSIS — Z20828 Contact with and (suspected) exposure to other viral communicable diseases: Secondary | ICD-10-CM | POA: Diagnosis not present

## 2020-03-02 DIAGNOSIS — Z03818 Encounter for observation for suspected exposure to other biological agents ruled out: Secondary | ICD-10-CM | POA: Diagnosis not present

## 2020-04-14 ENCOUNTER — Telehealth: Payer: Self-pay | Admitting: *Deleted

## 2020-04-14 DIAGNOSIS — G4733 Obstructive sleep apnea (adult) (pediatric): Secondary | ICD-10-CM

## 2020-04-14 NOTE — Telephone Encounter (Signed)
Chin strap ordered placed to [employer] via community message. 4 wk reminder made

## 2020-04-14 NOTE — Telephone Encounter (Signed)
-----   Message from Sueanne Margarita, MD sent at 04/14/2020  2:53 PM EDT ----- Please add a chin strap and repeat download in 4 weeks

## 2021-01-14 DIAGNOSIS — L821 Other seborrheic keratosis: Secondary | ICD-10-CM | POA: Diagnosis not present

## 2021-01-14 DIAGNOSIS — D485 Neoplasm of uncertain behavior of skin: Secondary | ICD-10-CM | POA: Diagnosis not present

## 2021-01-14 DIAGNOSIS — D171 Benign lipomatous neoplasm of skin and subcutaneous tissue of trunk: Secondary | ICD-10-CM | POA: Diagnosis not present

## 2021-01-14 DIAGNOSIS — L570 Actinic keratosis: Secondary | ICD-10-CM | POA: Diagnosis not present

## 2021-01-14 DIAGNOSIS — D692 Other nonthrombocytopenic purpura: Secondary | ICD-10-CM | POA: Diagnosis not present

## 2021-01-14 DIAGNOSIS — C44719 Basal cell carcinoma of skin of left lower limb, including hip: Secondary | ICD-10-CM | POA: Diagnosis not present

## 2021-01-14 DIAGNOSIS — D225 Melanocytic nevi of trunk: Secondary | ICD-10-CM | POA: Diagnosis not present

## 2021-01-14 DIAGNOSIS — L814 Other melanin hyperpigmentation: Secondary | ICD-10-CM | POA: Diagnosis not present

## 2021-01-14 DIAGNOSIS — I788 Other diseases of capillaries: Secondary | ICD-10-CM | POA: Diagnosis not present

## 2021-01-28 DIAGNOSIS — Z7952 Long term (current) use of systemic steroids: Secondary | ICD-10-CM | POA: Diagnosis not present

## 2021-01-28 DIAGNOSIS — M064 Inflammatory polyarthropathy: Secondary | ICD-10-CM | POA: Diagnosis not present

## 2021-01-28 DIAGNOSIS — Z6828 Body mass index (BMI) 28.0-28.9, adult: Secondary | ICD-10-CM | POA: Diagnosis not present

## 2021-01-28 DIAGNOSIS — E663 Overweight: Secondary | ICD-10-CM | POA: Diagnosis not present

## 2021-01-28 DIAGNOSIS — M154 Erosive (osteo)arthritis: Secondary | ICD-10-CM | POA: Diagnosis not present

## 2021-01-28 DIAGNOSIS — M810 Age-related osteoporosis without current pathological fracture: Secondary | ICD-10-CM | POA: Diagnosis not present

## 2021-01-28 DIAGNOSIS — M150 Primary generalized (osteo)arthritis: Secondary | ICD-10-CM | POA: Diagnosis not present

## 2021-02-02 ENCOUNTER — Ambulatory Visit (INDEPENDENT_AMBULATORY_CARE_PROVIDER_SITE_OTHER): Payer: Medicare Other | Admitting: Cardiology

## 2021-02-02 ENCOUNTER — Other Ambulatory Visit: Payer: Self-pay

## 2021-02-02 ENCOUNTER — Encounter: Payer: Self-pay | Admitting: Cardiology

## 2021-02-02 VITALS — BP 160/86 | HR 78 | Ht 67.0 in | Wt 186.8 lb

## 2021-02-02 DIAGNOSIS — I10 Essential (primary) hypertension: Secondary | ICD-10-CM | POA: Diagnosis not present

## 2021-02-02 DIAGNOSIS — I4821 Permanent atrial fibrillation: Secondary | ICD-10-CM

## 2021-02-02 DIAGNOSIS — I350 Nonrheumatic aortic (valve) stenosis: Secondary | ICD-10-CM | POA: Diagnosis not present

## 2021-02-02 DIAGNOSIS — I272 Pulmonary hypertension, unspecified: Secondary | ICD-10-CM

## 2021-02-02 DIAGNOSIS — I2510 Atherosclerotic heart disease of native coronary artery without angina pectoris: Secondary | ICD-10-CM | POA: Diagnosis not present

## 2021-02-02 DIAGNOSIS — I5032 Chronic diastolic (congestive) heart failure: Secondary | ICD-10-CM | POA: Diagnosis not present

## 2021-02-02 LAB — LIPID PANEL
Chol/HDL Ratio: 1.9 ratio (ref 0.0–4.4)
Cholesterol, Total: 148 mg/dL (ref 100–199)
HDL: 78 mg/dL (ref >39)
LDL Chol Calc (NIH): 55 mg/dL (ref 0–99)
Triglycerides: 76 mg/dL (ref 0–149)
VLDL Cholesterol Cal: 15 mg/dL (ref 5–40)

## 2021-02-02 LAB — COMPREHENSIVE METABOLIC PANEL WITH GFR
ALT: 18 [IU]/L (ref 0–32)
AST: 21 [IU]/L (ref 0–40)
Albumin/Globulin Ratio: 2 (ref 1.2–2.2)
Albumin: 4.7 g/dL — ABNORMAL HIGH (ref 3.6–4.6)
Alkaline Phosphatase: 62 [IU]/L (ref 44–121)
BUN/Creatinine Ratio: 20 (ref 12–28)
BUN: 21 mg/dL (ref 8–27)
Bilirubin Total: 0.7 mg/dL (ref 0.0–1.2)
CO2: 22 mmol/L (ref 20–29)
Calcium: 10 mg/dL (ref 8.7–10.3)
Chloride: 106 mmol/L (ref 96–106)
Creatinine, Ser: 1.06 mg/dL — ABNORMAL HIGH (ref 0.57–1.00)
Globulin, Total: 2.4 g/dL (ref 1.5–4.5)
Glucose: 101 mg/dL — ABNORMAL HIGH (ref 65–99)
Potassium: 4.6 mmol/L (ref 3.5–5.2)
Sodium: 144 mmol/L (ref 134–144)
Total Protein: 7.1 g/dL (ref 6.0–8.5)
eGFR: 52 mL/min/{1.73_m2} — ABNORMAL LOW

## 2021-02-02 LAB — CBC
Hematocrit: 39 % (ref 34.0–46.6)
Hemoglobin: 12.6 g/dL (ref 11.1–15.9)
MCH: 28.2 pg (ref 26.6–33.0)
MCHC: 32.3 g/dL (ref 31.5–35.7)
MCV: 87 fL (ref 79–97)
Platelets: 193 10*3/uL (ref 150–450)
RBC: 4.47 x10E6/uL (ref 3.77–5.28)
RDW: 15.8 % — ABNORMAL HIGH (ref 11.7–15.4)
WBC: 10.1 10*3/uL (ref 3.4–10.8)

## 2021-02-02 NOTE — Progress Notes (Signed)
Cardiology Office Note:    Date:  02/02/2021   ID:  [name], DOB April 20, 1936, MRN [id number]  PCP:  Maurice Small, MD  Cardiologist:  Fransico Him, MD    Referring MD: Maurice Small, MD   Chief Complaint  Patient presents with  . Aortic Stenosis  . Coronary Artery Disease  . Congestive Heart Failure  . Atrial Fibrillation  . Hyperlipidemia    History of Present Illness:    DEYANNA MCTIER is a 85 y.o. female with a hx of chronic Afib (on xarelto), HTN, chronic diastolic heart failure, aortic stenosis, HLD, obesity, GERD, and breast cancer.  She has a hx of CAD with cath in 2018 showing 70% focal ostial stenosis of the first diagonal branch of the left anterior descending coronary artery, 30-40% stenosis of the proximal right coronary artery, and otherwise very mild nonobstructive coronary artery disease. Medical management was recommended.  There was mild pulmonary hypertension.She underwent TAVR with 59mm Edwards Sapien 3 bioprosthesis 11/2016. She also has OSA and is on CPAP.    She is here today for followup and is doing well.  She denies any chest pain or pressure, SOB, DOE, PND, orthopnea, LE edema,  palpitations or syncope. She had a dizzy spell a year ago but dose not think she passed out but fell to the ground.  She was evaluated in the ER and everything was fine and she has not had any dizziness since then.  She is compliant with her meds and is tolerating meds with no SE.    She is doing well with her CPAP device and thinks that she has gotten used to it.  She tolerates the mask and feels the pressure is adequate.  Since going on CPAP she feels rested in the am and has no significant daytime sleepiness.  She denies any significant mouth or nasal dryness or nasal congestion.  She does not think that he snores.     Past Medical History:  Diagnosis Date  . Anginal pain ([place])   . Aortic stenosis    severe AS with moderate AR by echo with normal LVF  . Arthritis   .  CAD in native artery, non obstructive cath 11/2016.  12/16/2016  . Cancer [facility])    left breast mastectomy(cancer)-surgery only  . Chronic diastolic congestive heart failure ([place])   . Complication of anesthesia    extremely claustophobic(only occurs with narcotic medications as stated per pt)  . Diverticulosis   . Dysrhythmia    Afib  . Fibromyalgia   . GERD (gastroesophageal reflux disease)   . Hard of hearing    wears hearing aides   . History of breast cancer no recurrence   dx 2004   s/p  left breast mastectomy w/ node dissection/   no chemo or radiation  . History of TIA (transient ischemic attack)    per scan--  no residual  . Hyperlipidemia   . Hypertension   . Nocturia   . OSA on CPAP     severe/  AHI 34/hr  . Permanent atrial fibrillation (HCC)    on Xarelto for CHADS2VASC score of 5  . Pulmonary HTN ([place])    PASP 66mmHg by echo 11/2016  . Urge urinary incontinence   . Wears glasses     Past Surgical History:  Procedure Laterality Date  . ABDOMINOPLASTY  1987  . APPENDECTOMY  1965  . BREAST SURGERY Left 2004   mastectomy   . CARDIAC CATHETERIZATION N/A 11/19/2016  Procedure: Right/Left Heart Cath and Coronary Angiography;  Surgeon: Troy Sine, MD;  Location: [place] [facility];  Service: Cardiovascular;  Laterality: N/A;  . CARPAL TUNNEL RELEASE Right 07-08-2004  . COLONOSCOPY WITH ESOPHAGOGASTRODUODENOSCOPY (EGD)  2006  . EXPLANTATION BREAST IMPLANT AND DEBRIDEMENT Left 09/15/2005  . INTERSTIM IMPLANT PLACEMENT N/A 08/22/2014   Procedure: Barrie Lyme IMPLANT STAGE ONE/TWO;  Surgeon: Reece Packer, MD;  Location: [facility];  Service: Urology;  Laterality: N/A;  . JOINT REPLACEMENT Left 2017   knee replacement  . MASTECTOMY    . MASTECTOMY, MODIFIED RADICAL W/RECONSTRUCTION  09-16-2003   left breast W/ SLN DISSECTION (pt states had 13 operations for implant infection   . REPLACE TISSUE EXPANDER LEFT BREAST AND TOTAL CAPSULECTOMY  08-05-2006  .  REVERSE SHOULDER ARTHROPLASTY Left 10/26/2019   Procedure: REVERSE SHOULDER ARTHROPLASTY;  Surgeon: Netta Cedars, MD;  Location: [facility];  Service: Orthopedics;  Laterality: Left;  with interscalene block  . REVISION BREAST RECONSTRUCTION Left 11-11-2004   12-03-2004  DRAINAGE SEREMA LEFT CHEST  . TEE WITHOUT CARDIOVERSION N/A 12/14/2016   Procedure: TRANSESOPHAGEAL ECHOCARDIOGRAM (TEE);  Surgeon: Burnell Blanks, MD;  Location: [place];  Service: Open Heart Surgery;  Laterality: N/A;  . TOTAL HIP ARTHROPLASTY Left 07/17/2013   Procedure: LEFT TOTAL HIP ARTHROPLASTY ANTERIOR APPROACH;  Surgeon: Mauri Pole, MD;  Location: [facility];  Service: Orthopedics;  Laterality: Left;  . TOTAL HIP ARTHROPLASTY Right 01/28/2015   Procedure: RIGHT TOTAL HIP ARTHROPLASTY ANTERIOR APPROACH;  Surgeon: Paralee Cancel, MD;  Location: [facility];  Service: Orthopedics;  Laterality: Right;  . TOTAL KNEE ARTHROPLASTY Left 11/11/2015   Procedure: LEFT TOTAL KNEE ARTHROPLASTY;  Surgeon: Paralee Cancel, MD;  Location: [facility];  Service: Orthopedics;  Laterality: Left;  . TOTAL SHOULDER ARTHROPLASTY  08/11/2012   Procedure: TOTAL SHOULDER ARTHROPLASTY;  Surgeon: Augustin Schooling, MD;  Location: [place];  Service: Orthopedics;  Laterality: Right;  RIGHT  TOTAL SHOULDER  ARTHROPLASTY   . TRANSCATHETER AORTIC VALVE REPLACEMENT, TRANSFEMORAL N/A 12/14/2016   Procedure: TRANSCATHETER AORTIC VALVE REPLACEMENT, TRANSFEMORAL;  Surgeon: Burnell Blanks, MD;  Location: [place];  Service: Open Heart Surgery;  Laterality: N/A;  . TRANSTHORACIC ECHOCARDIOGRAM  02-25-2014   moderate LVH/  ef 55-60%/  mod. to sev. calcification AV with mild to moderate AV stenosis/  mild to mod. AR and MR/ mild PR/   moderate to severe LAE  . TUBAL LIGATION  1973    Current Medications: Current Meds  Medication Sig  . alendronate (FOSAMAX) 70 MG tablet Take 70 mg by mouth every Saturday.  Marland Kitchen amoxicillin (AMOXIL) 500 MG capsule Take 2,000 mg by mouth See  admin instructions. Take 4 capsules (2000 mg) by mouth 1 hour prior to dental work  . atorvastatin (LIPITOR) 80 MG tablet Take 1 tablet (80 mg total) by mouth daily.  Marland Kitchen diltiazem (CARDIZEM CD) 300 MG 24 hr capsule Take 1 capsule (300 mg total) by mouth daily.  . furosemide (LASIX) 20 MG tablet Take 20 mg by mouth daily.   . hydrOXYzine (ATARAX/VISTARIL) 25 MG tablet Take 25 mg by mouth as directed.  Marland Kitchen losartan (COZAAR) 100 MG tablet Take 1 tablet (100 mg total) by mouth daily. (Patient taking differently: Take 50 mg by mouth daily.)  . Melatonin 10 MG TABS Take 10 mg by mouth at bedtime as needed (for sleep).   . nitroGLYCERIN (NITROSTAT) 0.4 MG SL tablet Place 0.4 mg under the tongue every 5 (five) minutes as needed  for chest pain.   Marland Kitchen omeprazole (PRILOSEC) 20 MG capsule Take 20 mg by mouth daily as needed (acid reflux/indigestion.).   Marland Kitchen predniSONE (DELTASONE) 5 MG tablet Take 5 mg by mouth daily with breakfast.  . rivaroxaban (XARELTO) 20 MG TABS tablet Take 20 mg by mouth daily with supper.  . triamcinolone ointment (KENALOG) 0.1 % Apply 1 application topically 2 (two) times daily as needed (affected areas of feet).   . TURMERIC PO Take 1,000 mg by mouth 2 (two) times daily.     Allergies:   Beta adrenergic blockers, Other, Oxycodone, Sotalol hcl, Tramadol, Clonidine derivatives, Crestor [rosuvastatin calcium], Latex, Rosuvastatin calcium, Rosuvastatin, and Tape   Social History   Socioeconomic History  . Marital status: Married    Spouse name: Not on file  . Number of children: Not on file  . Years of education: Not on file  . Highest education level: Not on file  Occupational History  . Not on file  Tobacco Use  . Smoking status: Former Smoker    Packs/day: 0.25    Years: 8.00    Pack years: 2.00    Types: Cigarettes    Quit date: 10/18/1966    Years since quitting: 54.3  . Smokeless tobacco: Never Used  Vaping Use  . Vaping Use: Never used  Substance and Sexual Activity  .  Alcohol use: No  . Drug use: No  . Sexual activity: Yes  Other Topics Concern  . Not on file  Social History Narrative  . Not on file   Social Determinants of Health   Financial Resource Strain: Not on file  Food Insecurity: Not on file  Transportation Needs: Not on file  Physical Activity: Not on file  Stress: Not on file  Social Connections: Not on file     Family History: The patient's family history includes Heart disease in her father and mother.  ROS:   Please see the history of present illness.    ROS  All other systems reviewed and negative.   EKGs/Labs/Other Studies Reviewed:    The following studies were reviewed today: PAP compliance downlad  EKG:  EKG is  ordered today and showed atrial fibrillation with CVR and nonspecific T wave abnormality  Recent Labs: 02/05/2020: ALT 18; BUN 19; Creatinine, Ser 0.86; Potassium 4.4; Sodium 146   Recent Lipid Panel    Component Value Date/Time   CHOL 125 02/05/2020 0734   TRIG 83 02/05/2020 0734   HDL 67 02/05/2020 0734   CHOLHDL 1.9 02/05/2020 0734   LDLCALC 42 02/05/2020 0734    Physical Exam:    VS:  BP (!) 160/86   Pulse 78   Ht 5\' 7"  (1.702 m)   Wt 186 lb 12.8 oz (84.7 kg)   BMI 29.26 kg/m     Wt Readings from Last 3 Encounters:  02/02/21 186 lb 12.8 oz (84.7 kg)  01/31/20 191 lb 3.2 oz (86.7 kg)  10/26/19 204 lb (92.5 kg)     GEN: Well nourished, well developed in no acute distress HEENT: Normal NECK: No JVD; No carotid bruits LYMPHATICS: No lymphadenopathy CARDIAC:irregularly irregular, no murmurs, rubs, gallops RESPIRATORY:  Clear to auscultation without rales, wheezing or rhonchi  ABDOMEN: Soft, non-tender, non-distended MUSCULOSKELETAL:  No edema; No deformity  SKIN: Warm and dry NEUROLOGIC:  Alert and oriented x 3 PSYCHIATRIC:  Normal affect    ASSESSMENT:    1. Severe aortic stenosis   2. Coronary artery disease involving native coronary artery of native heart  without angina  pectoris   3. Chronic diastolic congestive heart failure ([place])   4. Primary hypertension   5. Permanent atrial fibrillation (HCC)   6. Pulmonary HTN ([place])    PLAN:    In order of problems listed above:  1.Aortic stenosis -s/p TAVR with 24mm Edwards Sapien 3 bioprosthesis 11/2016.  -echo 08/2019 showed normal LVF with stable TAVR and no PVL  2. ASCAD -cath 2018 with  70% focal ostial stenosis of the first diagonal branch of the left anterior descending coronary artery, 30-40% stenosis of the proximal right coronary artery, and otherwise very mild nonobstructive coronary artery disease.  -she has not had any anginal symptoms -continue statin.   -no ASA due to DOAC for afib.  3. Chronic diastolic CHF -she appears euvolemic on exam today with no edema -weight is stable -continue Lasix 20mg  daily  4. Hyperlipidemia -LDL goal < 70 -LDL was 42 in April 2021 -repeat FLp and ALT -continue atorvastatin 80mg  daily and Zetia 10mg  daily  5. Hypertension -BP is borderline controlled on exam today -she had been on Losartan 100mg  daily but is now on only 50mg  daily -continue Cardizem CD 300mg  daily and Losartan 50mg  daily\ -I have asked her to check her BP daily for a week and call with results -check BMET  6.OSA  -The patient is tolerating PAP therapy well without any problems.   -The patient has been using and benefiting from PAP use and will continue to benefit from therapy.  -I will get a download from her DME  7. Chronic atrial fibrillation -HR is well controlled and she has not had any palpitations -denies any bleeding problems on Xarelto -continue Cardizem CD 300mg  daily for rate control -check BMET and Hbg  8. Pulmonary HTN -Moderate by echo 01/2018 with PASP 18mmHg -repeat 2D echo 08/2019 with PASP 27mmHg -repeat 2D echo    Medication Adjustments/Labs and Tests Ordered: Current medicines are reviewed at length with the patient today.  Concerns  regarding medicines are outlined above.  Orders Placed This Encounter  Procedures  . EKG 12-Lead   No orders of the defined types were placed in this encounter.   Signed, Fransico Him, MD  02/02/2021 10:53 AM    [place]

## 2021-02-02 NOTE — Addendum Note (Signed)
Addended by: Eulis Foster on: 02/02/2021 11:12 AM   Modules accepted: Orders

## 2021-02-02 NOTE — Addendum Note (Signed)
Addended by: Antonieta Iba on: 02/02/2021 11:03 AM   Modules accepted: Orders

## 2021-02-02 NOTE — Patient Instructions (Addendum)
Medication Instructions:  Your physician recommends that you continue on your current medications as directed. Please refer to the Current Medication list given to you today.  *If you need a refill on your cardiac medications before your next appointment, please call your pharmacy*  Lab Work: Fasting lipids, CMET, and CBC  If you have labs (blood work) drawn today and your tests are completely normal, you will receive your results only by: Marland Kitchen MyChart Message (if you have MyChart) OR . A paper copy in the mail If you have any lab test that is abnormal or we need to change your treatment, we will call you to review the results.  Testing/Procedures: Your physician has requested that you have an echocardiogram. Echocardiography is a painless test that uses sound waves to create images of your heart. It provides your doctor with information about the size and shape of your heart and how well your heart's chambers and valves are working. This procedure takes approximately one hour. There are no restrictions for this procedure.  Follow-Up: At [facility], you and your health needs are our priority.  As part of our continuing mission to provide you with exceptional heart care, we have created designated Provider Care Teams.  These Care Teams include your primary Cardiologist (physician) and Advanced Practice Providers (APPs -  Physician Assistants and Nurse Practitioners) who all work together to provide you with the care you need, when you need it.  Your next appointment:   1 year(s)  The format for your next appointment:   In Person  Provider:   You may see Fransico Him, MD or one of the following Advanced Practice Providers on your designated Care Team:    Melina Copa, PA-C  Ermalinda Barrios, PA-C

## 2021-02-03 DIAGNOSIS — C44719 Basal cell carcinoma of skin of left lower limb, including hip: Secondary | ICD-10-CM | POA: Diagnosis not present

## 2021-02-06 DIAGNOSIS — M810 Age-related osteoporosis without current pathological fracture: Secondary | ICD-10-CM | POA: Diagnosis not present

## 2021-02-06 DIAGNOSIS — C50919 Malignant neoplasm of unspecified site of unspecified female breast: Secondary | ICD-10-CM | POA: Diagnosis not present

## 2021-02-06 DIAGNOSIS — E782 Mixed hyperlipidemia: Secondary | ICD-10-CM | POA: Diagnosis not present

## 2021-02-06 DIAGNOSIS — I2510 Atherosclerotic heart disease of native coronary artery without angina pectoris: Secondary | ICD-10-CM | POA: Diagnosis not present

## 2021-02-06 DIAGNOSIS — I482 Chronic atrial fibrillation, unspecified: Secondary | ICD-10-CM | POA: Diagnosis not present

## 2021-02-06 DIAGNOSIS — N183 Chronic kidney disease, stage 3 unspecified: Secondary | ICD-10-CM | POA: Diagnosis not present

## 2021-02-06 DIAGNOSIS — G4733 Obstructive sleep apnea (adult) (pediatric): Secondary | ICD-10-CM | POA: Diagnosis not present

## 2021-02-06 DIAGNOSIS — I10 Essential (primary) hypertension: Secondary | ICD-10-CM | POA: Diagnosis not present

## 2021-02-06 DIAGNOSIS — I350 Nonrheumatic aortic (valve) stenosis: Secondary | ICD-10-CM | POA: Diagnosis not present

## 2021-02-06 DIAGNOSIS — R399 Unspecified symptoms and signs involving the genitourinary system: Secondary | ICD-10-CM | POA: Diagnosis not present

## 2021-02-06 DIAGNOSIS — Z0000 Encounter for general adult medical examination without abnormal findings: Secondary | ICD-10-CM | POA: Diagnosis not present

## 2021-02-19 DIAGNOSIS — C44719 Basal cell carcinoma of skin of left lower limb, including hip: Secondary | ICD-10-CM | POA: Diagnosis not present

## 2021-02-26 ENCOUNTER — Ambulatory Visit (HOSPITAL_COMMUNITY): Payer: Medicare Other | Attending: Cardiology

## 2021-02-26 ENCOUNTER — Other Ambulatory Visit: Payer: Self-pay

## 2021-02-26 ENCOUNTER — Other Ambulatory Visit: Payer: Medicare Other

## 2021-02-26 DIAGNOSIS — I272 Pulmonary hypertension, unspecified: Secondary | ICD-10-CM

## 2021-02-26 LAB — ECHOCARDIOGRAM COMPLETE
AR max vel: 2.15 cm2
AV Area VTI: 2.43 cm2
AV Area mean vel: 2.19 cm2
AV Mean grad: 7.4 mmHg
AV Peak grad: 12.6 mmHg
Ao pk vel: 1.77 m/s
S' Lateral: 2.5 cm

## 2021-02-27 ENCOUNTER — Telehealth: Payer: Self-pay

## 2021-02-27 DIAGNOSIS — I272 Pulmonary hypertension, unspecified: Secondary | ICD-10-CM

## 2021-02-27 DIAGNOSIS — G4733 Obstructive sleep apnea (adult) (pediatric): Secondary | ICD-10-CM

## 2021-02-27 NOTE — Telephone Encounter (Signed)
The patient has been notified of the result and verbalized understanding.  All questions (if any) were answered. Jacqueline Iba, RN 02/27/2021 4:36 PM  The patient states that she is leaving for [place] in 5 days and will not be returning until September 2022.

## 2021-02-27 NOTE — Telephone Encounter (Signed)
-----   Message from Sueanne Margarita, MD sent at 02/27/2021  8:19 AM EDT ----- Echo showed normal heart function with moderately thickened heart muscle, mildly enlarged RV, moderately elevated pressure in the arteries of the lungs call pulmonary HTN, severely enlarged upper chambers of the heart called the atria and stable TAVR. Compared to prior echo pulmonary HTN has worsened.  Please get PFTs with DLCO, PSG to rule out OSA and VQ scan to rule out PE.  Repeat echo in 1 year

## 2021-03-02 NOTE — Telephone Encounter (Signed)
Spoke with the patient who states that they are leaving town on 5/18 and that she would only be able to get the test done tomorrow prior to 11:30am.  I have spoken with scheduling and they do not have any availability tomorrow.

## 2021-03-02 NOTE — Addendum Note (Signed)
Addended by: Antonieta Iba on: 03/02/2021 03:49 PM   Modules accepted: Orders

## 2021-03-02 NOTE — Telephone Encounter (Signed)
Spoke with the patient's husband and advised that they need to come by the office first thing tomorrow morning for lab work. He verbalized understanding and states that the patient will be here.

## 2021-03-02 NOTE — Addendum Note (Signed)
Addended by: Antonieta Iba on: 03/02/2021 04:47 PM   Modules accepted: Orders

## 2021-03-03 ENCOUNTER — Other Ambulatory Visit: Payer: Self-pay

## 2021-03-03 ENCOUNTER — Other Ambulatory Visit: Payer: Medicare Other

## 2021-03-03 DIAGNOSIS — I272 Pulmonary hypertension, unspecified: Secondary | ICD-10-CM | POA: Diagnosis not present

## 2021-03-03 DIAGNOSIS — G4733 Obstructive sleep apnea (adult) (pediatric): Secondary | ICD-10-CM | POA: Diagnosis not present

## 2021-03-03 DIAGNOSIS — Z20822 Contact with and (suspected) exposure to covid-19: Secondary | ICD-10-CM | POA: Diagnosis not present

## 2021-03-03 DIAGNOSIS — J00 Acute nasopharyngitis [common cold]: Secondary | ICD-10-CM | POA: Diagnosis not present

## 2021-03-03 DIAGNOSIS — Z03818 Encounter for observation for suspected exposure to other biological agents ruled out: Secondary | ICD-10-CM | POA: Diagnosis not present

## 2021-03-03 LAB — D-DIMER, QUANTITATIVE: D-DIMER: 0.4 mg/L FEU (ref 0.00–0.49)

## 2021-04-27 DIAGNOSIS — Z20822 Contact with and (suspected) exposure to covid-19: Secondary | ICD-10-CM | POA: Diagnosis not present

## 2021-06-30 ENCOUNTER — Encounter: Payer: Self-pay | Admitting: Cardiology

## 2021-07-15 ENCOUNTER — Ambulatory Visit (HOSPITAL_COMMUNITY)
Admission: RE | Admit: 2021-07-15 | Discharge: 2021-07-15 | Disposition: A | Discharge status: Home / Self Care | Payer: Medicare Other | Source: Ambulatory Visit | Attending: Cardiology | Admitting: Cardiology

## 2021-07-15 ENCOUNTER — Encounter (HOSPITAL_COMMUNITY)
Admission: RE | Admit: 2021-07-15 | Discharge: 2021-07-15 | Disposition: A | Discharge status: Home / Self Care | Payer: Medicare Other | Source: Ambulatory Visit | Attending: Cardiology | Admitting: Cardiology

## 2021-07-15 ENCOUNTER — Other Ambulatory Visit: Payer: Self-pay

## 2021-07-15 DIAGNOSIS — I517 Cardiomegaly: Secondary | ICD-10-CM | POA: Diagnosis not present

## 2021-07-15 DIAGNOSIS — G4733 Obstructive sleep apnea (adult) (pediatric): Secondary | ICD-10-CM | POA: Diagnosis not present

## 2021-07-15 DIAGNOSIS — I272 Pulmonary hypertension, unspecified: Secondary | ICD-10-CM | POA: Diagnosis not present

## 2021-07-15 DIAGNOSIS — R0602 Shortness of breath: Secondary | ICD-10-CM | POA: Diagnosis not present

## 2021-07-15 DIAGNOSIS — J439 Emphysema, unspecified: Secondary | ICD-10-CM | POA: Diagnosis not present

## 2021-07-15 DIAGNOSIS — I10 Essential (primary) hypertension: Secondary | ICD-10-CM | POA: Diagnosis not present

## 2021-07-15 MED ORDER — TECHNETIUM TO 99M ALBUMIN AGGREGATED
4.4000 | Freq: Once | INTRAVENOUS | Status: AC | PRN
  Administered 2021-07-15: 4.4 via INTRAVENOUS

## 2021-07-22 DIAGNOSIS — R413 Other amnesia: Secondary | ICD-10-CM | POA: Diagnosis not present

## 2021-07-22 DIAGNOSIS — H0520 Unspecified exophthalmos: Secondary | ICD-10-CM | POA: Diagnosis not present

## 2021-07-22 DIAGNOSIS — Z23 Encounter for immunization: Secondary | ICD-10-CM | POA: Diagnosis not present

## 2021-07-22 DIAGNOSIS — R7309 Other abnormal glucose: Secondary | ICD-10-CM | POA: Diagnosis not present

## 2021-07-22 DIAGNOSIS — I10 Essential (primary) hypertension: Secondary | ICD-10-CM | POA: Diagnosis not present

## 2021-07-22 DIAGNOSIS — F419 Anxiety disorder, unspecified: Secondary | ICD-10-CM | POA: Diagnosis not present

## 2021-07-23 ENCOUNTER — Other Ambulatory Visit: Payer: Self-pay | Admitting: Family Medicine

## 2021-07-23 DIAGNOSIS — H0520 Unspecified exophthalmos: Secondary | ICD-10-CM

## 2021-07-23 DIAGNOSIS — R413 Other amnesia: Secondary | ICD-10-CM

## 2021-07-23 DIAGNOSIS — Z23 Encounter for immunization: Secondary | ICD-10-CM

## 2021-07-27 ENCOUNTER — Emergency Department (HOSPITAL_COMMUNITY)
Admission: EM | Admit: 2021-07-27 | Discharge: 2021-07-27 | Disposition: A | Discharge status: Home / Self Care | Payer: Medicare Other | Attending: Emergency Medicine | Admitting: Emergency Medicine

## 2021-07-27 ENCOUNTER — Other Ambulatory Visit: Payer: Self-pay

## 2021-07-27 ENCOUNTER — Encounter (HOSPITAL_COMMUNITY): Payer: Self-pay

## 2021-07-27 ENCOUNTER — Emergency Department (HOSPITAL_COMMUNITY): Payer: Medicare Other

## 2021-07-27 DIAGNOSIS — R531 Weakness: Secondary | ICD-10-CM | POA: Diagnosis not present

## 2021-07-27 DIAGNOSIS — I517 Cardiomegaly: Secondary | ICD-10-CM | POA: Diagnosis not present

## 2021-07-27 DIAGNOSIS — R0602 Shortness of breath: Secondary | ICD-10-CM | POA: Insufficient documentation

## 2021-07-27 DIAGNOSIS — I509 Heart failure, unspecified: Secondary | ICD-10-CM | POA: Diagnosis not present

## 2021-07-27 DIAGNOSIS — Z5321 Procedure and treatment not carried out due to patient leaving prior to being seen by health care provider: Secondary | ICD-10-CM | POA: Diagnosis not present

## 2021-07-27 DIAGNOSIS — R5383 Other fatigue: Secondary | ICD-10-CM | POA: Diagnosis not present

## 2021-07-27 DIAGNOSIS — I10 Essential (primary) hypertension: Secondary | ICD-10-CM | POA: Diagnosis not present

## 2021-07-27 LAB — CBC WITH DIFFERENTIAL/PLATELET
Abs Immature Granulocytes: 0.03 10*3/uL (ref 0.00–0.07)
Basophils Absolute: 0.1 10*3/uL (ref 0.0–0.1)
Basophils Relative: 1 %
Eosinophils Absolute: 0 10*3/uL (ref 0.0–0.5)
Eosinophils Relative: 0 %
HCT: 37.8 % (ref 36.0–46.0)
Hemoglobin: 12.1 g/dL (ref 12.0–15.0)
Immature Granulocytes: 0 %
Lymphocytes Relative: 9 %
Lymphs Abs: 1 10*3/uL (ref 0.7–4.0)
MCH: 31.2 pg (ref 26.0–34.0)
MCHC: 32 g/dL (ref 30.0–36.0)
MCV: 97.4 fL (ref 80.0–100.0)
Monocytes Absolute: 0.7 10*3/uL (ref 0.1–1.0)
Monocytes Relative: 6 %
Neutro Abs: 8.9 10*3/uL — ABNORMAL HIGH (ref 1.7–7.7)
Neutrophils Relative %: 84 %
Platelets: 174 10*3/uL (ref 150–400)
RBC: 3.88 MIL/uL (ref 3.87–5.11)
RDW: 14.8 % (ref 11.5–15.5)
WBC: 10.7 10*3/uL — ABNORMAL HIGH (ref 4.0–10.5)
nRBC: 0 % (ref 0.0–0.2)

## 2021-07-27 LAB — COMPREHENSIVE METABOLIC PANEL
ALT: 16 U/L (ref 0–44)
AST: 23 U/L (ref 15–41)
Albumin: 3.4 g/dL — ABNORMAL LOW (ref 3.5–5.0)
Alkaline Phosphatase: 54 U/L (ref 38–126)
Anion gap: 12 (ref 5–15)
BUN: 21 mg/dL (ref 8–23)
CO2: 21 mmol/L — ABNORMAL LOW (ref 22–32)
Calcium: 9.4 mg/dL (ref 8.9–10.3)
Chloride: 104 mmol/L (ref 98–111)
Creatinine, Ser: 1.06 mg/dL — ABNORMAL HIGH (ref 0.44–1.00)
GFR, Estimated: 52 mL/min — ABNORMAL LOW (ref >60)
Glucose, Bld: 110 mg/dL — ABNORMAL HIGH (ref 70–99)
Potassium: 4.2 mmol/L (ref 3.5–5.1)
Sodium: 137 mmol/L (ref 135–145)
Total Bilirubin: 1.4 mg/dL — ABNORMAL HIGH (ref 0.3–1.2)
Total Protein: 6.7 g/dL (ref 6.5–8.1)

## 2021-07-27 NOTE — ED Provider Notes (Signed)
Emergency Medicine Provider Triage Evaluation Note  [name] , a 85 y.o. female  was evaluated in triage.  Pt complains of 1 week of severe fatigue. No other signifciant symptoms.   Review of Systems  Positive: Fatigue Negative: NVD  Physical Exam  BP 133/69 (BP Location: Right Arm)   Pulse 66   Temp 98.1 F (36.7 C) (Oral)   Resp 20   SpO2 100%  Gen:   Awake, no distress   Resp:  Normal effort  MSK:   Moves extremities without difficulty  Other:    Medical Decision Making  Medically screening exam initiated at 6:09 PM.  Appropriate orders placed.  [name] was informed that the remainder of the evaluation will be completed by another provider, this initial triage assessment does not replace that evaluation, and the importance of remaining in the ED until their evaluation is complete.  Work-up initiated for patient with fatigue.  Will obtain EKG, basic labs, chest x-ray and urine   Tedd Sias, [place] 07/27/21 1820    Sherwood Gambler, MD 07/28/21 1528

## 2021-07-27 NOTE — ED Notes (Signed)
Pt stated that she would like to leave. Pt was advised to stay but still decided to leave

## 2021-07-27 NOTE — ED Triage Notes (Signed)
Pt reports 1 week of worsening generalized weakness, doe. Denies chest pain or leg swelling, hx of chf. Resp e.u at this time

## 2021-07-29 DIAGNOSIS — M25562 Pain in left knee: Secondary | ICD-10-CM | POA: Diagnosis not present

## 2021-08-04 ENCOUNTER — Ambulatory Visit
Admission: RE | Admit: 2021-08-04 | Discharge: 2021-08-04 | Disposition: A | Discharge status: Home / Self Care | Payer: Medicare Other | Source: Ambulatory Visit | Attending: Family Medicine | Admitting: Family Medicine

## 2021-08-04 ENCOUNTER — Other Ambulatory Visit: Payer: Self-pay | Admitting: Family Medicine

## 2021-08-04 ENCOUNTER — Emergency Department (HOSPITAL_BASED_OUTPATIENT_CLINIC_OR_DEPARTMENT_OTHER): Payer: Medicare Other

## 2021-08-04 ENCOUNTER — Inpatient Hospital Stay (HOSPITAL_BASED_OUTPATIENT_CLINIC_OR_DEPARTMENT_OTHER)
Admission: EM | Admit: 2021-08-04 | Discharge: 2021-08-12 | DRG: 871 | Disposition: A | Discharge status: Home Health | Payer: Medicare Other | Attending: Internal Medicine | Admitting: Internal Medicine

## 2021-08-04 ENCOUNTER — Encounter (HOSPITAL_BASED_OUTPATIENT_CLINIC_OR_DEPARTMENT_OTHER): Payer: Self-pay | Admitting: *Deleted

## 2021-08-04 ENCOUNTER — Other Ambulatory Visit: Payer: Self-pay

## 2021-08-04 DIAGNOSIS — J189 Pneumonia, unspecified organism: Secondary | ICD-10-CM | POA: Diagnosis present

## 2021-08-04 DIAGNOSIS — Z20822 Contact with and (suspected) exposure to covid-19: Secondary | ICD-10-CM | POA: Diagnosis not present

## 2021-08-04 DIAGNOSIS — R0602 Shortness of breath: Secondary | ICD-10-CM

## 2021-08-04 DIAGNOSIS — E8770 Fluid overload, unspecified: Secondary | ICD-10-CM | POA: Diagnosis not present

## 2021-08-04 DIAGNOSIS — Z853 Personal history of malignant neoplasm of breast: Secondary | ICD-10-CM

## 2021-08-04 DIAGNOSIS — E785 Hyperlipidemia, unspecified: Secondary | ICD-10-CM | POA: Diagnosis present

## 2021-08-04 DIAGNOSIS — K219 Gastro-esophageal reflux disease without esophagitis: Secondary | ICD-10-CM | POA: Diagnosis present

## 2021-08-04 DIAGNOSIS — Z96612 Presence of left artificial shoulder joint: Secondary | ICD-10-CM | POA: Diagnosis present

## 2021-08-04 DIAGNOSIS — Z96611 Presence of right artificial shoulder joint: Secondary | ICD-10-CM | POA: Diagnosis present

## 2021-08-04 DIAGNOSIS — Z96652 Presence of left artificial knee joint: Secondary | ICD-10-CM | POA: Diagnosis present

## 2021-08-04 DIAGNOSIS — B952 Enterococcus as the cause of diseases classified elsewhere: Secondary | ICD-10-CM

## 2021-08-04 DIAGNOSIS — R413 Other amnesia: Secondary | ICD-10-CM

## 2021-08-04 DIAGNOSIS — M797 Fibromyalgia: Secondary | ICD-10-CM | POA: Diagnosis present

## 2021-08-04 DIAGNOSIS — I2489 Other forms of acute ischemic heart disease: Secondary | ICD-10-CM

## 2021-08-04 DIAGNOSIS — Z885 Allergy status to narcotic agent status: Secondary | ICD-10-CM

## 2021-08-04 DIAGNOSIS — Z95828 Presence of other vascular implants and grafts: Secondary | ICD-10-CM

## 2021-08-04 DIAGNOSIS — I10 Essential (primary) hypertension: Secondary | ICD-10-CM | POA: Diagnosis present

## 2021-08-04 DIAGNOSIS — I330 Acute and subacute infective endocarditis: Secondary | ICD-10-CM | POA: Diagnosis present

## 2021-08-04 DIAGNOSIS — Z888 Allergy status to other drugs, medicaments and biological substances status: Secondary | ICD-10-CM

## 2021-08-04 DIAGNOSIS — I248 Other forms of acute ischemic heart disease: Secondary | ICD-10-CM

## 2021-08-04 DIAGNOSIS — E872 Acidosis, unspecified: Secondary | ICD-10-CM | POA: Diagnosis present

## 2021-08-04 DIAGNOSIS — Z86711 Personal history of pulmonary embolism: Secondary | ICD-10-CM

## 2021-08-04 DIAGNOSIS — H0520 Unspecified exophthalmos: Secondary | ICD-10-CM

## 2021-08-04 DIAGNOSIS — I517 Cardiomegaly: Secondary | ICD-10-CM | POA: Diagnosis not present

## 2021-08-04 DIAGNOSIS — R0902 Hypoxemia: Secondary | ICD-10-CM | POA: Diagnosis not present

## 2021-08-04 DIAGNOSIS — Z79899 Other long term (current) drug therapy: Secondary | ICD-10-CM

## 2021-08-04 DIAGNOSIS — A498 Other bacterial infections of unspecified site: Secondary | ICD-10-CM

## 2021-08-04 DIAGNOSIS — Y831 Surgical operation with implant of artificial internal device as the cause of abnormal reaction of the patient, or of later complication, without mention of misadventure at the time of the procedure: Secondary | ICD-10-CM | POA: Diagnosis present

## 2021-08-04 DIAGNOSIS — R7881 Bacteremia: Secondary | ICD-10-CM

## 2021-08-04 DIAGNOSIS — I2510 Atherosclerotic heart disease of native coronary artery without angina pectoris: Secondary | ICD-10-CM | POA: Diagnosis present

## 2021-08-04 DIAGNOSIS — Z91048 Other nonmedicinal substance allergy status: Secondary | ICD-10-CM

## 2021-08-04 DIAGNOSIS — R6520 Severe sepsis without septic shock: Secondary | ICD-10-CM | POA: Insufficient documentation

## 2021-08-04 DIAGNOSIS — G4733 Obstructive sleep apnea (adult) (pediatric): Secondary | ICD-10-CM | POA: Diagnosis present

## 2021-08-04 DIAGNOSIS — I272 Pulmonary hypertension, unspecified: Secondary | ICD-10-CM | POA: Diagnosis present

## 2021-08-04 DIAGNOSIS — I38 Endocarditis, valve unspecified: Secondary | ICD-10-CM

## 2021-08-04 DIAGNOSIS — Z7901 Long term (current) use of anticoagulants: Secondary | ICD-10-CM

## 2021-08-04 DIAGNOSIS — D649 Anemia, unspecified: Secondary | ICD-10-CM

## 2021-08-04 DIAGNOSIS — I4821 Permanent atrial fibrillation: Secondary | ICD-10-CM | POA: Diagnosis present

## 2021-08-04 DIAGNOSIS — Z8673 Personal history of transient ischemic attack (TIA), and cerebral infarction without residual deficits: Secondary | ICD-10-CM

## 2021-08-04 DIAGNOSIS — Z9012 Acquired absence of left breast and nipple: Secondary | ICD-10-CM

## 2021-08-04 DIAGNOSIS — I5032 Chronic diastolic (congestive) heart failure: Secondary | ICD-10-CM | POA: Diagnosis present

## 2021-08-04 DIAGNOSIS — J9601 Acute respiratory failure with hypoxia: Secondary | ICD-10-CM | POA: Diagnosis not present

## 2021-08-04 DIAGNOSIS — Z23 Encounter for immunization: Secondary | ICD-10-CM

## 2021-08-04 DIAGNOSIS — K8020 Calculus of gallbladder without cholecystitis without obstruction: Secondary | ICD-10-CM | POA: Diagnosis not present

## 2021-08-04 DIAGNOSIS — I110 Hypertensive heart disease with heart failure: Secondary | ICD-10-CM | POA: Diagnosis present

## 2021-08-04 DIAGNOSIS — Z91040 Latex allergy status: Secondary | ICD-10-CM

## 2021-08-04 DIAGNOSIS — T826XXA Infection and inflammatory reaction due to cardiac valve prosthesis, initial encounter: Secondary | ICD-10-CM

## 2021-08-04 DIAGNOSIS — Z953 Presence of xenogenic heart valve: Secondary | ICD-10-CM

## 2021-08-04 DIAGNOSIS — Z7983 Long term (current) use of bisphosphonates: Secondary | ICD-10-CM

## 2021-08-04 DIAGNOSIS — M79605 Pain in left leg: Secondary | ICD-10-CM | POA: Diagnosis not present

## 2021-08-04 DIAGNOSIS — Z87891 Personal history of nicotine dependence: Secondary | ICD-10-CM

## 2021-08-04 DIAGNOSIS — A419 Sepsis, unspecified organism: Secondary | ICD-10-CM | POA: Insufficient documentation

## 2021-08-04 DIAGNOSIS — Z96643 Presence of artificial hip joint, bilateral: Secondary | ICD-10-CM | POA: Diagnosis present

## 2021-08-04 DIAGNOSIS — A4181 Sepsis due to Enterococcus: Secondary | ICD-10-CM | POA: Diagnosis not present

## 2021-08-04 DIAGNOSIS — Z8249 Family history of ischemic heart disease and other diseases of the circulatory system: Secondary | ICD-10-CM

## 2021-08-04 LAB — CBC WITH DIFFERENTIAL/PLATELET
Abs Immature Granulocytes: 0.06 10*3/uL (ref 0.00–0.07)
Basophils Absolute: 0 10*3/uL (ref 0.0–0.1)
Basophils Relative: 0 %
Eosinophils Absolute: 0.1 10*3/uL (ref 0.0–0.5)
Eosinophils Relative: 0 %
HCT: 29.7 % — ABNORMAL LOW (ref 36.0–46.0)
Hemoglobin: 9.8 g/dL — ABNORMAL LOW (ref 12.0–15.0)
Immature Granulocytes: 0 %
Lymphocytes Relative: 3 %
Lymphs Abs: 0.5 10*3/uL — ABNORMAL LOW (ref 0.7–4.0)
MCH: 30.7 pg (ref 26.0–34.0)
MCHC: 33 g/dL (ref 30.0–36.0)
MCV: 93.1 fL (ref 80.0–100.0)
Monocytes Absolute: 0.6 10*3/uL (ref 0.1–1.0)
Monocytes Relative: 4 %
Neutro Abs: 16.6 10*3/uL — ABNORMAL HIGH (ref 1.7–7.7)
Neutrophils Relative %: 93 %
Platelets: 155 10*3/uL (ref 150–400)
RBC: 3.19 MIL/uL — ABNORMAL LOW (ref 3.87–5.11)
RDW: 14.8 % (ref 11.5–15.5)
WBC: 17.9 10*3/uL — ABNORMAL HIGH (ref 4.0–10.5)
nRBC: 0 % (ref 0.0–0.2)

## 2021-08-04 LAB — COMPREHENSIVE METABOLIC PANEL
ALT: 18 U/L (ref 0–44)
AST: 25 U/L (ref 15–41)
Albumin: 3 g/dL — ABNORMAL LOW (ref 3.5–5.0)
Alkaline Phosphatase: 70 U/L (ref 38–126)
Anion gap: 12 (ref 5–15)
BUN: 36 mg/dL — ABNORMAL HIGH (ref 8–23)
CO2: 18 mmol/L — ABNORMAL LOW (ref 22–32)
Calcium: 9 mg/dL (ref 8.9–10.3)
Chloride: 106 mmol/L (ref 98–111)
Creatinine, Ser: 1.21 mg/dL — ABNORMAL HIGH (ref 0.44–1.00)
GFR, Estimated: 44 mL/min — ABNORMAL LOW (ref >60)
Glucose, Bld: 133 mg/dL — ABNORMAL HIGH (ref 70–99)
Potassium: 3.4 mmol/L — ABNORMAL LOW (ref 3.5–5.1)
Sodium: 136 mmol/L (ref 135–145)
Total Bilirubin: 0.9 mg/dL (ref 0.3–1.2)
Total Protein: 6.5 g/dL (ref 6.5–8.1)

## 2021-08-04 LAB — TROPONIN I (HIGH SENSITIVITY): Troponin I (High Sensitivity): 53 ng/L — ABNORMAL HIGH (ref <18)

## 2021-08-04 LAB — RESP PANEL BY RT-PCR (FLU A&B, COVID) ARPGX2
Influenza A by PCR: NEGATIVE
Influenza B by PCR: NEGATIVE
SARS Coronavirus 2 by RT PCR: NEGATIVE

## 2021-08-04 LAB — BRAIN NATRIURETIC PEPTIDE: B Natriuretic Peptide: 474.6 pg/mL — ABNORMAL HIGH (ref 0.0–100.0)

## 2021-08-04 LAB — LACTIC ACID, PLASMA: Lactic Acid, Venous: 2.6 mmol/L (ref 0.5–1.9)

## 2021-08-04 MED ORDER — SODIUM CHLORIDE 0.9 % IV SOLN
500.0000 mg | Freq: Once | INTRAVENOUS | Status: AC
  Administered 2021-08-05: 500 mg via INTRAVENOUS
  Filled 2021-08-04: qty 500

## 2021-08-04 MED ORDER — IOHEXOL 350 MG/ML SOLN
60.0000 mL | Freq: Once | INTRAVENOUS | Status: AC | PRN
  Administered 2021-08-04: 60 mL via INTRAVENOUS

## 2021-08-04 MED ORDER — SODIUM CHLORIDE 0.9 % IV SOLN
1.0000 g | Freq: Once | INTRAVENOUS | Status: AC
  Administered 2021-08-05: 1 g via INTRAVENOUS
  Filled 2021-08-04: qty 10

## 2021-08-04 NOTE — ED Notes (Signed)
Pt O2 sats at 89% while this RN in room starting IV; pt placed on O2 @ 2l via [place] and increased to 94-95%

## 2021-08-04 NOTE — ED Triage Notes (Signed)
Shaking all over, weakness, sob for 10 days. She went to [facility] 10 days ago for the same but left due to long wait.

## 2021-08-04 NOTE — ED Provider Notes (Signed)
[facility] DEPARTMENT Provider Note   CSN: [id number] Arrival date & time: 08/04/21  1913     History Chief Complaint  Patient presents with   Shortness of Breath    [name] is a 85 y.o. female.  The history is provided by the patient, the spouse and medical records.  Shortness of Breath Severity:  Severe Onset quality:  Gradual Duration:  1 week Timing:  Constant Progression:  Worsening Chronicity:  New Context: not URI   Relieved by:  Nothing Worsened by:  Exertion Ineffective treatments:  None tried Associated symptoms: no abdominal pain, no chest pain, no cough, no diaphoresis, no fever, no headaches, no sputum production, no syncope, no vomiting and no wheezing   Risk factors: hx of cancer   Risk factors: no hx of PE/DVT       Past Medical History:  Diagnosis Date   Anginal pain (HCC)    Aortic stenosis    severe AS with moderate AR by echo with normal LVF   Arthritis    CAD in native artery, non obstructive cath 11/2016.  12/16/2016   Cancer ([place])    left breast mastectomy(cancer)-surgery only   Chronic diastolic congestive heart failure (HCC)    Complication of anesthesia    extremely claustophobic(only occurs with narcotic medications as stated per pt)   Diverticulosis    Dysrhythmia    Afib   Fibromyalgia    GERD (gastroesophageal reflux disease)    Hard of hearing    wears hearing aides    History of breast cancer no recurrence   dx 2004   s/p  left breast mastectomy w/ node dissection/   no chemo or radiation   History of TIA (transient ischemic attack)    per scan--  no residual   Hyperlipidemia    Hypertension    Nocturia    OSA on CPAP     severe/  AHI 34/hr   Permanent atrial fibrillation (HCC)    on Xarelto for CHADS2VASC score of 5   Pulmonary HTN ([place])    PASP 62mmHg by echo 11/2016   Urge urinary incontinence    Wears glasses     Patient Active Problem List   Diagnosis Date Noted   H/O total  shoulder replacement, left 10/26/2019   S/p TAVR (transcatheter aortic valve replacement), bioprosthetic 12/14/16 12/16/2016   CAD (coronary artery disease), native coronary artery 12/16/2016   Pulmonary HTN ([place])    S/P left TKA 11/11/2015   Overweight (BMI 25.0-29.9) 01/29/2015   S/P right THA, AA 01/28/2015   Chronic diastolic congestive heart failure (HCC)    OSA (obstructive sleep apnea) 02/25/2014   Permanent atrial fibrillation ([place])    Expected blood loss anemia 07/18/2013   Hypercholesteremia    Infected postoperative breast seroma    Breast implant removal status    S/P carpal tunnel release    Cancer ([place])    Shortness of breath    GERD (gastroesophageal reflux disease)    Complication of anesthesia    Menopause    Breast cancer ([place])    Lower back pain    TIA (transient ischemic attack)    Severe aortic stenosis    Fibromyalgia    Arthritis    Osteoarthrosis, unspecified whether generalized or localized, shoulder region 08/11/2012   hx: breast cancer, left, invasive lobular, multifocal, receptor + her 2 - 11/15/2011   HTN (hypertension) 11/13/2011   Retroperitoneal hematoma 03/19/2011    Past Surgical History:  Procedure  Laterality Date   ABDOMINOPLASTY  [place]   BREAST SURGERY Left 2004   mastectomy    CARDIAC CATHETERIZATION N/A 11/19/2016   Procedure: Right/Left Heart Cath and Coronary Angiography;  Surgeon: Troy Sine, MD;  Location: [facility];  Service: Cardiovascular;  Laterality: N/A;   CARPAL TUNNEL RELEASE Right 07-08-2004   COLONOSCOPY WITH ESOPHAGOGASTRODUODENOSCOPY (EGD)  2006   EXPLANTATION BREAST IMPLANT AND DEBRIDEMENT Left 09/15/2005   INTERSTIM IMPLANT PLACEMENT N/A 08/22/2014   Procedure: Barrie Lyme IMPLANT STAGE ONE/TWO;  Surgeon: Reece Packer, MD;  Location: [facility];  Service: Urology;  Laterality: N/A;   JOINT REPLACEMENT Left 2017   knee replacement   MASTECTOMY     MASTECTOMY,  MODIFIED RADICAL W/RECONSTRUCTION  09-16-2003   left breast W/ SLN DISSECTION (pt states had 13 operations for implant infection    REPLACE TISSUE EXPANDER LEFT BREAST AND TOTAL CAPSULECTOMY  08-05-2006   REVERSE SHOULDER ARTHROPLASTY Left 10/26/2019   Procedure: REVERSE SHOULDER ARTHROPLASTY;  Surgeon: Netta Cedars, MD;  Location: [facility];  Service: Orthopedics;  Laterality: Left;  with interscalene block   REVISION BREAST RECONSTRUCTION Left 11-11-2004   12-03-2004  DRAINAGE SEREMA LEFT CHEST   TEE WITHOUT CARDIOVERSION N/A 12/14/2016   Procedure: TRANSESOPHAGEAL ECHOCARDIOGRAM (TEE);  Surgeon: Burnell Blanks, MD;  Location: [facility];  Service: Open Heart Surgery;  Laterality: N/A;   TOTAL HIP ARTHROPLASTY Left 07/17/2013   Procedure: LEFT TOTAL HIP ARTHROPLASTY ANTERIOR APPROACH;  Surgeon: Mauri Pole, MD;  Location: [facility];  Service: Orthopedics;  Laterality: Left;   TOTAL HIP ARTHROPLASTY Right 01/28/2015   Procedure: RIGHT TOTAL HIP ARTHROPLASTY ANTERIOR APPROACH;  Surgeon: Paralee Cancel, MD;  Location: [facility];  Service: Orthopedics;  Laterality: Right;   TOTAL KNEE ARTHROPLASTY Left 11/11/2015   Procedure: LEFT TOTAL KNEE ARTHROPLASTY;  Surgeon: Paralee Cancel, MD;  Location: [facility];  Service: Orthopedics;  Laterality: Left;   TOTAL SHOULDER ARTHROPLASTY  08/11/2012   Procedure: TOTAL SHOULDER ARTHROPLASTY;  Surgeon: Augustin Schooling, MD;  Location: [facility];  Service: Orthopedics;  Laterality: Right;  RIGHT  TOTAL SHOULDER  ARTHROPLASTY    TRANSCATHETER AORTIC VALVE REPLACEMENT, TRANSFEMORAL N/A 12/14/2016   Procedure: TRANSCATHETER AORTIC VALVE REPLACEMENT, TRANSFEMORAL;  Surgeon: Burnell Blanks, MD;  Location: [facility];  Service: Open Heart Surgery;  Laterality: N/A;   TRANSTHORACIC ECHOCARDIOGRAM  02-25-2014   moderate LVH/  ef 55-60%/  mod. to sev. calcification AV with mild to moderate AV stenosis/  mild to mod. AR and MR/ mild PR/   moderate to severe LAE   TUBAL LIGATION  1973      OB History   No obstetric history on file.     Family History  Problem Relation Age of Onset   Heart disease Mother    Heart disease Father     Social History   Tobacco Use   Smoking status: Former    Packs/day: 0.25    Years: 8.00    Pack years: 2.00    Types: Cigarettes    Quit date: 10/18/1966    Years since quitting: 54.8   Smokeless tobacco: Never  Vaping Use   Vaping Use: Never used  Substance Use Topics   Alcohol use: No   Drug use: No    Home Medications Prior to Admission medications   Medication Sig Start Date End Date Taking? Authorizing Provider  alendronate (FOSAMAX) 70 MG tablet Take 70 mg by mouth every Saturday. 09/19/19  [provider]  amoxicillin (AMOXIL) 500 MG capsule Take 2,000 mg by mouth See admin instructions. Take 4 capsules (2000 mg) by mouth 1 hour prior to dental work 09/03/15   [provider]  atorvastatin (LIPITOR) 80 MG tablet Take 1 tablet (80 mg total) by mouth daily. 05/16/19   Sueanne Margarita, MD  diltiazem (CARDIZEM CD) 300 MG 24 hr capsule Take 1 capsule (300 mg total) by mouth daily. 09/21/19   Sueanne Margarita, MD  ezetimibe (ZETIA) 10 MG tablet Take 1 tablet (10 mg total) by mouth daily. 06/28/19 10/15/20  Sueanne Margarita, MD  furosemide (LASIX) 20 MG tablet Take 20 mg by mouth daily.     [provider]  hydrOXYzine (ATARAX/VISTARIL) 25 MG tablet Take 25 mg by mouth as directed.    [provider]  losartan (COZAAR) 100 MG tablet Take 1 tablet (100 mg total) by mouth daily. Patient taking differently: Take 50 mg by mouth daily. 08/08/19   Sueanne Margarita, MD  Melatonin 10 MG TABS Take 10 mg by mouth at bedtime as needed (for sleep).     [provider]  nitroGLYCERIN (NITROSTAT) 0.4 MG SL tablet Place 0.4 mg under the tongue every 5 (five) minutes as needed for chest pain.     [provider]  omeprazole (PRILOSEC) 20 MG capsule Take 20 mg by mouth daily as needed (acid  reflux/indigestion.).     [provider]  predniSONE (DELTASONE) 5 MG tablet Take 5 mg by mouth daily with breakfast.    [provider]  rivaroxaban (XARELTO) 20 MG TABS tablet Take 20 mg by mouth daily with supper.    [provider]  triamcinolone ointment (KENALOG) 0.1 % Apply 1 application topically 2 (two) times daily as needed (affected areas of feet).  08/01/19   [provider]  TURMERIC PO Take 1,000 mg by mouth 2 (two) times daily.    [provider]    Allergies    Beta adrenergic blockers, Other, Oxycodone, Sotalol hcl, Tramadol, Clonidine derivatives, Crestor [rosuvastatin calcium], Latex, Rosuvastatin calcium, Rosuvastatin, and Tape  Review of Systems   Review of Systems  Constitutional:  Positive for chills and fatigue. Negative for diaphoresis and fever.  HENT:  Negative for congestion.   Eyes:  Negative for visual disturbance.  Respiratory:  Positive for shortness of breath. Negative for cough, sputum production, chest tightness, wheezing and stridor.   Cardiovascular:  Negative for chest pain, palpitations, leg swelling and syncope.  Gastrointestinal:  Negative for abdominal pain, constipation, diarrhea and vomiting.  Genitourinary:  Negative for dysuria and flank pain.  Musculoskeletal:  Negative for back pain.  Neurological:  Negative for light-headedness and headaches.  Psychiatric/Behavioral:  Negative for agitation.   All other systems reviewed and are negative.  Physical Exam Updated Vital Signs BP 129/75   Pulse 92   Temp 98.1 F (36.7 C) (Oral)   Resp (!) 22   Ht 5\' 7"  (1.702 m)   Wt 84.7 kg   SpO2 97%   BMI 29.25 kg/m   Physical Exam Vitals and nursing note reviewed.  Constitutional:      General: She is not in acute distress.    Appearance: She is well-developed.  HENT:     Head: Normocephalic and atraumatic.  Eyes:     Conjunctiva/sclera: Conjunctivae normal.  Cardiovascular:     Rate and  Rhythm: Normal rate and regular rhythm.     Heart sounds: No murmur heard. Pulmonary:  Effort: Pulmonary effort is normal. Tachypnea present. No respiratory distress.     Breath sounds: No decreased breath sounds, wheezing, rhonchi or rales.  Chest:     Chest wall: No tenderness.  Abdominal:     Palpations: Abdomen is soft.     Tenderness: There is no abdominal tenderness.  Musculoskeletal:     Cervical back: Neck supple.     Right lower leg: No edema.     Left lower leg: No edema.  Skin:    General: Skin is warm and dry.     Capillary Refill: Capillary refill takes less than 2 seconds.     Findings: No erythema.  Neurological:     General: No focal deficit present.     Mental Status: She is alert.  Psychiatric:        Mood and Affect: Mood normal.    ED Results / Procedures / Treatments   Labs (all labs ordered are listed, but only abnormal results are displayed) Labs Reviewed  CBC WITH DIFFERENTIAL/PLATELET - Abnormal; Notable for the following components:      Result Value   WBC 17.9 (*)    RBC 3.19 (*)    Hemoglobin 9.8 (*)    HCT 29.7 (*)    Neutro Abs 16.6 (*)    Lymphs Abs 0.5 (*)    All other components within normal limits  COMPREHENSIVE METABOLIC PANEL - Abnormal; Notable for the following components:   Potassium 3.4 (*)    CO2 18 (*)    Glucose, Bld 133 (*)    BUN 36 (*)    Creatinine, Ser 1.21 (*)    Albumin 3.0 (*)    GFR, Estimated 44 (*)    All other components within normal limits  LACTIC ACID, PLASMA - Abnormal; Notable for the following components:   Lactic Acid, Venous 2.6 (*)    All other components within normal limits  BRAIN NATRIURETIC PEPTIDE - Abnormal; Notable for the following components:   B Natriuretic Peptide 474.6 (*)    All other components within normal limits  TROPONIN I (HIGH SENSITIVITY) - Abnormal; Notable for the following components:   Troponin I (High Sensitivity) 53 (*)    All other components within normal limits   RESP PANEL BY RT-PCR (FLU A&B, COVID) ARPGX2  CULTURE, BLOOD (ROUTINE X 2)  CULTURE, BLOOD (ROUTINE X 2)  URINE CULTURE  LACTIC ACID, PLASMA  URINALYSIS, ROUTINE W REFLEX MICROSCOPIC  TROPONIN I (HIGH SENSITIVITY)    EKG EKG Interpretation  Date/Time:  Tuesday August 04 2021 22:09:10 EDT Ventricular Rate:  85 PR Interval:    QRS Duration: 101 QT Interval:  380 QTC Calculation: 452 R Axis:   47 Text Interpretation: Atrial fibrillation Low voltage, precordial leads when compared to prior, similar afib. No STEMI Confirmed by Antony Blackbird [phone]) on 08/04/2021 10:12:39 PM  Radiology CT Angio Chest PE W and/or Wo Contrast  Result Date: 08/04/2021 CLINICAL DATA:  PE suspected, high prob Recently returned from [place], hypoxia, exertional shortness of breath, left leg pain, also rigors and some malaise EXAM: CT ANGIOGRAPHY CHEST WITH CONTRAST TECHNIQUE: Multidetector CT imaging of the chest was performed using the standard protocol during bolus administration of intravenous contrast. Multiplanar CT image reconstructions and MIPs were obtained to evaluate the vascular anatomy. CONTRAST:  30mL OMNIPAQUE IOHEXOL 350 MG/ML SOLN COMPARISON:  11/29/2016 FINDINGS: Cardiovascular: Heart is enlarged. Prior TAVR. Multi-vessel coronary artery calcifications. Diffuse aortic calcifications. No aneurysm. No filling defects in the pulmonary arteries to suggest pulmonary  emboli. Mediastinum/Nodes: No mediastinal, hilar, or axillary adenopathy. Trachea and esophagus are unremarkable. Thyroid unremarkable. Lungs/Pleura: No effusions. Tree-in-bud nodular densities seen in the upper lobes which could reflect small airways disease/alveolitis. No confluent airspace opacities. Upper Abdomen: Gallstones within the gallbladder. Musculoskeletal: Prior left breast implant. No acute bony abnormality. Review of the MIP images confirms the above findings. IMPRESSION: No evidence of pulmonary embolus. Subtle scattered  tree-in-bud nodular densities in the upper lobes bilaterally may reflect small airways disease/alveolitis. No confluent areas of consolidation. Cardiomegaly, coronary artery disease. Cholelithiasis. Aortic Atherosclerosis (ICD10-I70.0). Electronically Signed   By: Rolm Baptise M.D.   On: 08/04/2021 22:49   US Venous Img Lower Unilateral Left  Result Date: 08/04/2021 CLINICAL DATA:  Left leg pain EXAM: LEFT LOWER EXTREMITY VENOUS DOPPLER ULTRASOUND TECHNIQUE: Gray-scale sonography with compression, as well as color and duplex ultrasound, were performed to evaluate the deep venous system(s) from the level of the common femoral vein through the popliteal and proximal calf veins. COMPARISON:  None. FINDINGS: VENOUS Normal compressibility of the common femoral, superficial femoral, and popliteal veins, as well as the visualized calf veins. Visualized portions of profunda femoral vein and great saphenous vein unremarkable. No filling defects to suggest DVT on grayscale or color Doppler imaging. Doppler waveforms show normal direction of venous flow, normal respiratory plasticity and response to augmentation. Limited views of the contralateral common femoral vein are unremarkable. OTHER None. Limitations: none IMPRESSION: Negative. Electronically Signed   By: Rolm Baptise M.D.   On: 08/04/2021 23:36    Procedures Procedures   CRITICAL CARE Performed by: Gwenyth Allegra Tanai Bouler Total critical care time: 30 minutes Critical care time was exclusive of separately billable procedures and treating other patients. Critical care was necessary to treat or prevent imminent or life-threatening deterioration. Critical care was time spent personally by me on the following activities: development of treatment plan with patient and/or surrogate as well as nursing, discussions with consultants, evaluation of patient's response to treatment, examination of patient, obtaining history from patient or surrogate, ordering and  performing treatments and interventions, ordering and review of laboratory studies, ordering and review of radiographic studies, pulse oximetry and re-evaluation of patient's condition.   Medications Ordered in ED Medications  cefTRIAXone (ROCEPHIN) 1 g in sodium chloride 0.9 % 100 mL IVPB (1 g Intravenous New Bag/Given 08/05/21 0036)  azithromycin (ZITHROMAX) 500 mg in sodium chloride 0.9 % 250 mL IVPB (has no administration in time range)  iohexol (OMNIPAQUE) 350 MG/ML injection 60 mL (60 mLs Intravenous Contrast Given 08/04/21 2226)    ED Course  I have reviewed the triage vital signs and the nursing notes.  Pertinent labs & imaging results that were available during my care of the patient were reviewed by me and considered in my medical decision making (see chart for details).    MDM Rules/Calculators/A&P                           MORIA BROPHY is a 85 y.o. female with a complex past medical history including hypertension, aortic stenosis status post TAVR, CHF, CAD, previous breast cancer, TIA, sleep apnea, pulmonary hypertension, fibromyalgia, and atrial fibrillation on Xarelto who presents with shaking chills, exertional shortness of breath, fatigue, and left leg pain.  According to patient, they just got back from [place] several weeks ago and for the last week patient has been having pain in her left knee.  She reportedly saw an orthopedist and x-rays were reassuring  but is still hurting as well as in the popliteal fossa area.  She says that for the last week and a half she has been having worsening shortness of breath with exertion where she cannot catch her breath.  She says that it worsened significantly today and on arrival was found to have oxygen saturations in the 80s.  She does not take oxygen at home.  She denies any chest pain or palpitations and also denies any cough or congestion.  She denies nausea, vomiting, constipation, diarrhea, or urinary changes.  She denies any  history of DVT or PE.  She has not had any syncopal episodes but reports that she has have the shaking all over when she was septic from a UTI in the past.  On exam, lungs are surprisingly clear.  Chest is nontender.  She does have a murmur.  Abdomen nontender.  Legs are nonedematous and left knee is tender.  Good pulses.  Back and flanks nontender.  No focal neurologic deficits initially.  Patient resting now on 2 L nasal cannula to maintain oxygen saturations in the mid 90s.  EKG shows afib with no STEMI.   Clinically I do feel need to rule out several concerning etiologies of her symptoms.  We will get a CT PE study if her kidneys can tolerate to rule out pulmonary embolism and will get ultrasound to rule out DVT given her recent trip from [place].  With her shaking chills and fatigue feeling similar to when she was septic from UTI, although she has no urinary symptoms, will get urinalysis, labs, blood cultures, and the CT scan will evaluate the chest for pneumonia.  Will also get a BNP given her heart failure and troponin for the exertional shortness of breath.  Due to the new hypoxia, anticipate admission after work-up.  Patient CT scan did not show evidence of PE.  It did show some opacities.  Given the patient's leukocytosis, elevated lactic acid, and the shaking rigors similar to when she was septic, I am concerned about possible pneumonia.  COVID and flu negative.  Will give antibiotics.  Patient also has some elevated BNP worse than prior and has elevated troponin.  Although she has no chest pain I am concerned about some fluid overload as well.  Her ultrasound of the leg did not show DVT and there was no PE on the CT scan.  Still waiting on urinalysis she has not been able to urinate for [place].  Will call for admission for fluid overload versus pneumonia causing hypoxia and shortness of breath.  Patient will be admitted for further management.  Final Clinical Impression(s) / ED  Diagnoses Final diagnoses:  Hypoxia  Exertional shortness of breath  Community acquired pneumonia, unspecified laterality  Hypervolemia, unspecified hypervolemia type    Clinical Impression: 1. Hypoxia   2. Exertional shortness of breath   3. Community acquired pneumonia, unspecified laterality   4. Hypervolemia, unspecified hypervolemia type     Disposition: Admit  This note was prepared with assistance of Dragon voice recognition software. Occasional wrong-word or sound-a-like substitutions may have occurred due to the inherent limitations of voice recognition software.     Sebastien Jackson, Gwenyth Allegra, MD 08/05/21 [phone]

## 2021-08-05 ENCOUNTER — Inpatient Hospital Stay (HOSPITAL_COMMUNITY): Payer: Medicare Other

## 2021-08-05 ENCOUNTER — Encounter (HOSPITAL_COMMUNITY): Payer: Self-pay | Admitting: Internal Medicine

## 2021-08-05 DIAGNOSIS — Z7901 Long term (current) use of anticoagulants: Secondary | ICD-10-CM

## 2021-08-05 DIAGNOSIS — I248 Other forms of acute ischemic heart disease: Secondary | ICD-10-CM

## 2021-08-05 DIAGNOSIS — I330 Acute and subacute infective endocarditis: Secondary | ICD-10-CM | POA: Diagnosis not present

## 2021-08-05 DIAGNOSIS — I38 Endocarditis, valve unspecified: Secondary | ICD-10-CM | POA: Diagnosis not present

## 2021-08-05 DIAGNOSIS — E785 Hyperlipidemia, unspecified: Secondary | ICD-10-CM | POA: Diagnosis present

## 2021-08-05 DIAGNOSIS — E7800 Pure hypercholesterolemia, unspecified: Secondary | ICD-10-CM | POA: Diagnosis not present

## 2021-08-05 DIAGNOSIS — R0902 Hypoxemia: Secondary | ICD-10-CM | POA: Diagnosis not present

## 2021-08-05 DIAGNOSIS — I700 Atherosclerosis of aorta: Secondary | ICD-10-CM | POA: Diagnosis not present

## 2021-08-05 DIAGNOSIS — I351 Nonrheumatic aortic (valve) insufficiency: Secondary | ICD-10-CM | POA: Diagnosis not present

## 2021-08-05 DIAGNOSIS — I083 Combined rheumatic disorders of mitral, aortic and tricuspid valves: Secondary | ICD-10-CM | POA: Diagnosis not present

## 2021-08-05 DIAGNOSIS — R0602 Shortness of breath: Secondary | ICD-10-CM

## 2021-08-05 DIAGNOSIS — I361 Nonrheumatic tricuspid (valve) insufficiency: Secondary | ICD-10-CM | POA: Diagnosis not present

## 2021-08-05 DIAGNOSIS — G4733 Obstructive sleep apnea (adult) (pediatric): Secondary | ICD-10-CM | POA: Diagnosis present

## 2021-08-05 DIAGNOSIS — I2510 Atherosclerotic heart disease of native coronary artery without angina pectoris: Secondary | ICD-10-CM | POA: Diagnosis present

## 2021-08-05 DIAGNOSIS — J189 Pneumonia, unspecified organism: Secondary | ICD-10-CM | POA: Diagnosis present

## 2021-08-05 DIAGNOSIS — Z9012 Acquired absence of left breast and nipple: Secondary | ICD-10-CM | POA: Diagnosis not present

## 2021-08-05 DIAGNOSIS — Z9989 Dependence on other enabling machines and devices: Secondary | ICD-10-CM | POA: Diagnosis not present

## 2021-08-05 DIAGNOSIS — I5032 Chronic diastolic (congestive) heart failure: Secondary | ICD-10-CM | POA: Diagnosis not present

## 2021-08-05 DIAGNOSIS — I272 Pulmonary hypertension, unspecified: Secondary | ICD-10-CM

## 2021-08-05 DIAGNOSIS — I110 Hypertensive heart disease with heart failure: Secondary | ICD-10-CM | POA: Diagnosis not present

## 2021-08-05 DIAGNOSIS — E872 Acidosis, unspecified: Secondary | ICD-10-CM | POA: Diagnosis not present

## 2021-08-05 DIAGNOSIS — B952 Enterococcus as the cause of diseases classified elsewhere: Secondary | ICD-10-CM | POA: Diagnosis not present

## 2021-08-05 DIAGNOSIS — A419 Sepsis, unspecified organism: Secondary | ICD-10-CM | POA: Diagnosis not present

## 2021-08-05 DIAGNOSIS — Y831 Surgical operation with implant of artificial internal device as the cause of abnormal reaction of the patient, or of later complication, without mention of misadventure at the time of the procedure: Secondary | ICD-10-CM | POA: Diagnosis present

## 2021-08-05 DIAGNOSIS — T826XXD Infection and inflammatory reaction due to cardiac valve prosthesis, subsequent encounter: Secondary | ICD-10-CM | POA: Diagnosis not present

## 2021-08-05 DIAGNOSIS — J9601 Acute respiratory failure with hypoxia: Secondary | ICD-10-CM | POA: Diagnosis present

## 2021-08-05 DIAGNOSIS — T82897A Other specified complication of cardiac prosthetic devices, implants and grafts, initial encounter: Secondary | ICD-10-CM | POA: Diagnosis not present

## 2021-08-05 DIAGNOSIS — Z888 Allergy status to other drugs, medicaments and biological substances status: Secondary | ICD-10-CM | POA: Diagnosis not present

## 2021-08-05 DIAGNOSIS — I10 Essential (primary) hypertension: Secondary | ICD-10-CM

## 2021-08-05 DIAGNOSIS — K219 Gastro-esophageal reflux disease without esophagitis: Secondary | ICD-10-CM | POA: Diagnosis present

## 2021-08-05 DIAGNOSIS — Z954 Presence of other heart-valve replacement: Secondary | ICD-10-CM | POA: Diagnosis not present

## 2021-08-05 DIAGNOSIS — Z853 Personal history of malignant neoplasm of breast: Secondary | ICD-10-CM | POA: Diagnosis not present

## 2021-08-05 DIAGNOSIS — D649 Anemia, unspecified: Secondary | ICD-10-CM | POA: Diagnosis not present

## 2021-08-05 DIAGNOSIS — R7881 Bacteremia: Secondary | ICD-10-CM | POA: Diagnosis not present

## 2021-08-05 DIAGNOSIS — Z91040 Latex allergy status: Secondary | ICD-10-CM | POA: Diagnosis not present

## 2021-08-05 DIAGNOSIS — T826XXA Infection and inflammatory reaction due to cardiac valve prosthesis, initial encounter: Secondary | ICD-10-CM | POA: Diagnosis not present

## 2021-08-05 DIAGNOSIS — Z885 Allergy status to narcotic agent status: Secondary | ICD-10-CM | POA: Diagnosis not present

## 2021-08-05 DIAGNOSIS — Z91048 Other nonmedicinal substance allergy status: Secondary | ICD-10-CM | POA: Diagnosis not present

## 2021-08-05 DIAGNOSIS — M797 Fibromyalgia: Secondary | ICD-10-CM | POA: Diagnosis present

## 2021-08-05 DIAGNOSIS — I2489 Other forms of acute ischemic heart disease: Secondary | ICD-10-CM

## 2021-08-05 DIAGNOSIS — Z953 Presence of xenogenic heart valve: Secondary | ICD-10-CM | POA: Diagnosis not present

## 2021-08-05 DIAGNOSIS — R6520 Severe sepsis without septic shock: Secondary | ICD-10-CM | POA: Diagnosis not present

## 2021-08-05 DIAGNOSIS — Z8673 Personal history of transient ischemic attack (TIA), and cerebral infarction without residual deficits: Secondary | ICD-10-CM | POA: Diagnosis not present

## 2021-08-05 DIAGNOSIS — Z20822 Contact with and (suspected) exposure to covid-19: Secondary | ICD-10-CM | POA: Diagnosis not present

## 2021-08-05 DIAGNOSIS — I340 Nonrheumatic mitral (valve) insufficiency: Secondary | ICD-10-CM | POA: Diagnosis not present

## 2021-08-05 DIAGNOSIS — A4181 Sepsis due to Enterococcus: Secondary | ICD-10-CM | POA: Diagnosis not present

## 2021-08-05 DIAGNOSIS — I4821 Permanent atrial fibrillation: Secondary | ICD-10-CM | POA: Diagnosis not present

## 2021-08-05 LAB — BLOOD CULTURE ID PANEL (REFLEXED) - BCID2

## 2021-08-05 LAB — COMPREHENSIVE METABOLIC PANEL
ALT: 15 U/L (ref 0–44)
AST: 20 U/L (ref 15–41)
Albumin: 2.5 g/dL — ABNORMAL LOW (ref 3.5–5.0)
Alkaline Phosphatase: 61 U/L (ref 38–126)
Anion gap: 8 (ref 5–15)
BUN: 28 mg/dL — ABNORMAL HIGH (ref 8–23)
CO2: 22 mmol/L (ref 22–32)
Calcium: 8.7 mg/dL — ABNORMAL LOW (ref 8.9–10.3)
Chloride: 105 mmol/L (ref 98–111)
Creatinine, Ser: 1 mg/dL (ref 0.44–1.00)
GFR, Estimated: 56 mL/min — ABNORMAL LOW (ref >60)
Glucose, Bld: 137 mg/dL — ABNORMAL HIGH (ref 70–99)
Potassium: 3.5 mmol/L (ref 3.5–5.1)
Sodium: 135 mmol/L (ref 135–145)
Total Bilirubin: 1.1 mg/dL (ref 0.3–1.2)
Total Protein: 5.9 g/dL — ABNORMAL LOW (ref 6.5–8.1)

## 2021-08-05 LAB — CBC WITH DIFFERENTIAL/PLATELET
Abs Immature Granulocytes: 0.08 10*3/uL — ABNORMAL HIGH (ref 0.00–0.07)
Basophils Absolute: 0 10*3/uL (ref 0.0–0.1)
Basophils Relative: 0 %
Eosinophils Absolute: 0 10*3/uL (ref 0.0–0.5)
Eosinophils Relative: 0 %
HCT: 28.1 % — ABNORMAL LOW (ref 36.0–46.0)
Hemoglobin: 9.2 g/dL — ABNORMAL LOW (ref 12.0–15.0)
Immature Granulocytes: 1 %
Lymphocytes Relative: 7 %
Lymphs Abs: 0.9 10*3/uL (ref 0.7–4.0)
MCH: 30.6 pg (ref 26.0–34.0)
MCHC: 32.7 g/dL (ref 30.0–36.0)
MCV: 93.4 fL (ref 80.0–100.0)
Monocytes Absolute: 0.6 10*3/uL (ref 0.1–1.0)
Monocytes Relative: 5 %
Neutro Abs: 11.7 10*3/uL — ABNORMAL HIGH (ref 1.7–7.7)
Neutrophils Relative %: 87 %
Platelets: 139 10*3/uL — ABNORMAL LOW (ref 150–400)
RBC: 3.01 MIL/uL — ABNORMAL LOW (ref 3.87–5.11)
RDW: 14.8 % (ref 11.5–15.5)
WBC: 13.3 10*3/uL — ABNORMAL HIGH (ref 4.0–10.5)
nRBC: 0 % (ref 0.0–0.2)

## 2021-08-05 LAB — ECHOCARDIOGRAM COMPLETE
AR max vel: 0.98 cm2
AV Area VTI: 0.82 cm2
AV Area mean vel: 0.83 cm2
AV Mean grad: 19.4 mmHg
AV Peak grad: 30.8 mmHg
Ao pk vel: 2.78 m/s
Calc EF: 59 %
Height: 67 in
S' Lateral: 2.2 cm
Single Plane A2C EF: 61.6 %
Single Plane A4C EF: 55.8 %
Weight: 3001.78 oz

## 2021-08-05 LAB — IRON AND TIBC
Iron: 16 ug/dL — ABNORMAL LOW (ref 28–170)
Saturation Ratios: 5 % — ABNORMAL LOW (ref 10.4–31.8)
TIBC: 305 ug/dL (ref 250–450)
UIBC: 289 ug/dL

## 2021-08-05 LAB — MAGNESIUM: Magnesium: 2 mg/dL (ref 1.7–2.4)

## 2021-08-05 LAB — LACTIC ACID, PLASMA
Lactic Acid, Venous: 2.6 mmol/L (ref 0.5–1.9)
Lactic Acid, Venous: 1.4 mmol/L (ref 0.5–1.9)

## 2021-08-05 LAB — PROCALCITONIN: Procalcitonin: 3.83 ng/mL

## 2021-08-05 LAB — TROPONIN I (HIGH SENSITIVITY)
Troponin I (High Sensitivity): 72 ng/L — ABNORMAL HIGH (ref <18)
Troponin I (High Sensitivity): 67 ng/L — ABNORMAL HIGH (ref <18)

## 2021-08-05 LAB — STREP PNEUMONIAE URINARY ANTIGEN: Strep Pneumo Urinary Antigen: NEGATIVE

## 2021-08-05 MED ORDER — RIVAROXABAN 20 MG PO TABS
20.0000 mg | ORAL_TABLET | Freq: Every day | ORAL | Status: DC
  Administered 2021-08-05 – 2021-08-12 (×8): 20 mg via ORAL
  Filled 2021-08-05 (×8): qty 1

## 2021-08-05 MED ORDER — ATORVASTATIN CALCIUM 80 MG PO TABS
80.0000 mg | ORAL_TABLET | Freq: Every day | ORAL | Status: DC
  Administered 2021-08-05 – 2021-08-12 (×8): 80 mg via ORAL
  Filled 2021-08-05 (×8): qty 1

## 2021-08-05 MED ORDER — MELATONIN 5 MG PO TABS
10.0000 mg | ORAL_TABLET | Freq: Every evening | ORAL | Status: DC | PRN
  Administered 2021-08-09: 10 mg via ORAL
  Filled 2021-08-05: qty 2

## 2021-08-05 MED ORDER — SODIUM CHLORIDE 0.9 % IV SOLN
2.0000 g | Freq: Four times a day (QID) | INTRAVENOUS | Status: DC
  Administered 2021-08-05 – 2021-08-06 (×3): 2 g via INTRAVENOUS
  Filled 2021-08-05 (×4): qty 2000

## 2021-08-05 MED ORDER — ALBUTEROL SULFATE (2.5 MG/3ML) 0.083% IN NEBU: 2.5000 mg | INHALATION_SOLUTION | RESPIRATORY_TRACT | Status: DC | PRN

## 2021-08-05 MED ORDER — ACETAMINOPHEN 325 MG PO TABS: 650.0000 mg | ORAL_TABLET | Freq: Four times a day (QID) | ORAL | Status: DC | PRN

## 2021-08-05 MED ORDER — CEFTRIAXONE SODIUM 2 G IJ SOLR
2.0000 g | INTRAMUSCULAR | Status: DC
Start: 2021-08-05 — End: 2021-08-05
  Administered 2021-08-05: 2 g via INTRAVENOUS
  Filled 2021-08-05: qty 20

## 2021-08-05 MED ORDER — RIVAROXABAN 15 MG PO TABS: 15.0000 mg | ORAL_TABLET | Freq: Every day | ORAL | Status: DC

## 2021-08-05 MED ORDER — DILTIAZEM HCL ER COATED BEADS 240 MG PO CP24
240.0000 mg | ORAL_CAPSULE | Freq: Every morning | ORAL | Status: DC
  Administered 2021-08-06 – 2021-08-12 (×7): 240 mg via ORAL
  Filled 2021-08-05 (×8): qty 1

## 2021-08-05 MED ORDER — PANTOPRAZOLE SODIUM 40 MG PO TBEC
40.0000 mg | DELAYED_RELEASE_TABLET | Freq: Every day | ORAL | Status: DC
  Administered 2021-08-05 – 2021-08-12 (×8): 40 mg via ORAL
  Filled 2021-08-05 (×8): qty 1

## 2021-08-05 MED ORDER — RIVAROXABAN 20 MG PO TABS: 20.0000 mg | ORAL_TABLET | Freq: Every day | ORAL | Status: DC

## 2021-08-05 MED ORDER — PREDNISONE 5 MG PO TABS
5.0000 mg | ORAL_TABLET | Freq: Every day | ORAL | Status: DC
  Administered 2021-08-05 – 2021-08-12 (×8): 5 mg via ORAL
  Filled 2021-08-05 (×8): qty 1

## 2021-08-05 MED ORDER — ACETAMINOPHEN 650 MG RE SUPP: 650.0000 mg | Freq: Four times a day (QID) | RECTAL | Status: DC | PRN

## 2021-08-05 MED ORDER — AZITHROMYCIN 250 MG PO TABS
500.0000 mg | ORAL_TABLET | Freq: Every day | ORAL | Status: DC
  Administered 2021-08-05: 500 mg via ORAL
  Filled 2021-08-05: qty 2

## 2021-08-05 MED ORDER — DILTIAZEM HCL ER COATED BEADS 180 MG PO CP24
300.0000 mg | ORAL_CAPSULE | Freq: Every day | ORAL | Status: DC
  Administered 2021-08-05: 300 mg via ORAL
  Filled 2021-08-05: qty 1

## 2021-08-05 NOTE — Plan of Care (Signed)
[facility] will assume care on arrival to accepting facility. Until arrival, care as per EDP. However, [facility] available 24/7 for questions and assistance.  Nursing staff please page [facility] Admits and Consults ([phone]) as soon as the patient arrives the hospital.   

## 2021-08-05 NOTE — Assessment & Plan Note (Signed)
-   Mild troponin leak expected in setting of severe sepsis - Repeat troponin this afternoon - No complaints of chest pain

## 2021-08-05 NOTE — Assessment & Plan Note (Signed)
Stable.  We will hold her ARB due to elevated serum creatinine.

## 2021-08-05 NOTE — Assessment & Plan Note (Signed)
Continue Protonix °

## 2021-08-05 NOTE — H&P (Signed)
History and Physical    [name]:[id number] DOB: December 14, 1935 DOA: 08/04/2021  PCP: Maurice Small, MD (Inactive)   Patient coming from: Home  I have personally briefly reviewed patient's old medical records in [place]  CC: shaking chills HPI: 85 year old white female with a history of A. fib on chronic Xarelto, history of TAVR, hypertension, history of pulmonary hypertension, reflux reported history of chronic diastolic heart failure presents to the ER with sudden onset of shaking chills.  Patient denies any fever.  Patient states that she recently came back from [place] about 3 weeks ago.  Patient was born in [place]  She grew up in [place].  She moved from [place] to [place] about 25 years ago.  Patient denies any fevers at home.  Was short of breath.  No cough.  No nausea, diarrhea or vomiting.  Patient denies any leg swelling.  She states that she has been taking all of her medications.  This includes her Xarelto for anticoagulation.  Patient was placed on 2 L of oxygen.  Due to room air saturations of 89%.  CTPA was negative for PE.  Her laboratory evaluation demonstrated serum bicarbonate of 18, BUN of 36, creatinine 1.2.  Her baseline creatinine is about 0.8-1.0.  CBC showed a white count of 17.9, hemoglobin of 9.8 which is a 3 g drop in about a week and a half.  Initial troponin of 53 with a second troponin of 72.  BNP was elevated at 474.  Lactic acid was 2.6.  To the lactic acidosis, hypoxia and sudden onset of chills associated with leukocytosis, patient started on antibiotics with Rocephin and Zithromax.  Assumption was made that she had community-acquired pneumonia despite no radiographic evidence of pneumonia.  Patient transported to [facility] for further evaluation.   ED Course: CTPA negative for PE or pneumonia. Hypoxic with RA sats 89%. WBC nearly 18, lactic acid 2.6. started on IV abx.  Review of Systems:  Review of Systems   Constitutional:  Positive for chills and malaise/fatigue. Negative for fever and weight loss.  HENT: Negative.    Eyes: Negative.   Respiratory:  Positive for shortness of breath. Negative for cough.   Cardiovascular:  Negative for chest pain.  Gastrointestinal: Negative.   Genitourinary: Negative.   Musculoskeletal: Negative.   Skin: Negative.   Neurological: Negative.   Endo/Heme/Allergies: Negative.   Psychiatric/Behavioral: Negative.    All other systems reviewed and are negative.  Past Medical History:  Diagnosis Date   Anginal pain ([place])    Aortic stenosis    severe AS with moderate AR by echo with normal LVF   Arthritis    CAD in native artery, non obstructive cath 11/2016.  12/16/2016   Cancer ([place])    left breast mastectomy(cancer)-surgery only   Chronic diastolic congestive heart failure ([place])    Complication of anesthesia    extremely claustophobic(only occurs with narcotic medications as stated per pt)   Diverticulosis    Dysrhythmia    Afib   Fibromyalgia    GERD (gastroesophageal reflux disease)    Hard of hearing    wears hearing aides    History of breast cancer no recurrence   dx 2004   s/p  left breast mastectomy w/ node dissection/   no chemo or radiation   History of TIA (transient ischemic attack)    per scan--  no residual   Hyperlipidemia    Hypertension    Nocturia    OSA on  CPAP     severe/  AHI 34/hr   Permanent atrial fibrillation (HCC)    on Xarelto for CHADS2VASC score of 5   Pulmonary HTN (HCC)    PASP 30mmHg by echo 11/2016   Urge urinary incontinence    Wears glasses     Past Surgical History:  Procedure Laterality Date   ABDOMINOPLASTY  [place]   BREAST SURGERY Left 2004   mastectomy    CARDIAC CATHETERIZATION N/A 11/19/2016   Procedure: Right/Left Heart Cath and Coronary Angiography;  Surgeon: Troy Sine, MD;  Location: [facility];  Service: Cardiovascular;  Laterality: N/A;   CARPAL TUNNEL RELEASE  Right 07-08-2004   COLONOSCOPY WITH ESOPHAGOGASTRODUODENOSCOPY (EGD)  2006   EXPLANTATION BREAST IMPLANT AND DEBRIDEMENT Left 09/15/2005   INTERSTIM IMPLANT PLACEMENT N/A 08/22/2014   Procedure: Barrie Lyme IMPLANT STAGE ONE/TWO;  Surgeon: Reece Packer, MD;  Location: [facility];  Service: Urology;  Laterality: N/A;   JOINT REPLACEMENT Left 2017   knee replacement   MASTECTOMY     MASTECTOMY, MODIFIED RADICAL W/RECONSTRUCTION  09-16-2003   left breast W/ SLN DISSECTION (pt states had 13 operations for implant infection    REPLACE TISSUE EXPANDER LEFT BREAST AND TOTAL CAPSULECTOMY  08-05-2006   REVERSE SHOULDER ARTHROPLASTY Left 10/26/2019   Procedure: REVERSE SHOULDER ARTHROPLASTY;  Surgeon: Netta Cedars, MD;  Location: [facility];  Service: Orthopedics;  Laterality: Left;  with interscalene block   REVISION BREAST RECONSTRUCTION Left 11-11-2004   12-03-2004  DRAINAGE SEREMA LEFT CHEST   TEE WITHOUT CARDIOVERSION N/A 12/14/2016   Procedure: TRANSESOPHAGEAL ECHOCARDIOGRAM (TEE);  Surgeon: Burnell Blanks, MD;  Location: [place];  Service: Open Heart Surgery;  Laterality: N/A;   TOTAL HIP ARTHROPLASTY Left 07/17/2013   Procedure: LEFT TOTAL HIP ARTHROPLASTY ANTERIOR APPROACH;  Surgeon: Mauri Pole, MD;  Location: [facility];  Service: Orthopedics;  Laterality: Left;   TOTAL HIP ARTHROPLASTY Right 01/28/2015   Procedure: RIGHT TOTAL HIP ARTHROPLASTY ANTERIOR APPROACH;  Surgeon: Paralee Cancel, MD;  Location: [facility];  Service: Orthopedics;  Laterality: Right;   TOTAL KNEE ARTHROPLASTY Left 11/11/2015   Procedure: LEFT TOTAL KNEE ARTHROPLASTY;  Surgeon: Paralee Cancel, MD;  Location: [facility];  Service: Orthopedics;  Laterality: Left;   TOTAL SHOULDER ARTHROPLASTY  08/11/2012   Procedure: TOTAL SHOULDER ARTHROPLASTY;  Surgeon: Augustin Schooling, MD;  Location: [place];  Service: Orthopedics;  Laterality: Right;  RIGHT  TOTAL SHOULDER  ARTHROPLASTY    TRANSCATHETER AORTIC VALVE REPLACEMENT,  TRANSFEMORAL N/A 12/14/2016   Procedure: TRANSCATHETER AORTIC VALVE REPLACEMENT, TRANSFEMORAL;  Surgeon: Burnell Blanks, MD;  Location: [place];  Service: Open Heart Surgery;  Laterality: N/A;   TRANSTHORACIC ECHOCARDIOGRAM  02-25-2014   moderate LVH/  ef 55-60%/  mod. to sev. calcification AV with mild to moderate AV stenosis/  mild to mod. AR and MR/ mild PR/   moderate to severe LAE   TUBAL LIGATION  1973     reports that she quit smoking about 54 years ago. Her smoking use included cigarettes. She has a 2.00 pack-year smoking history. She has never used smokeless tobacco. She reports that she does not drink alcohol and does not use drugs.  Allergies  Allergen Reactions   Beta Adrenergic Blockers Shortness Of Breath   Other     Narcotic pain medications - anxiety attacks   Oxycodone Shortness Of Breath and Other (See Comments)    Tolerates Dilaudid, Fentanyl   Sotalol Hcl Shortness  Of Breath and Other (See Comments)   Tramadol Anxiety and Other (See Comments)    Can take if absolutely necessary   Clonidine Derivatives Palpitations    bradycardia   Crestor [Rosuvastatin Calcium] Other (See Comments)    MYALGIAS   Latex Rash    Per patient-bandaids- no problems with gloves   Rosuvastatin Calcium Nausea Only   Rosuvastatin    Tape Rash     bandaids     Family History  Problem Relation Age of Onset   Heart disease Mother    Heart disease Father     Prior to Admission medications   Medication Sig Start Date End Date Taking? Authorizing Provider  alendronate (FOSAMAX) 70 MG tablet Take 70 mg by mouth every Saturday. 09/19/19   [provider]  amoxicillin (AMOXIL) 500 MG capsule Take 2,000 mg by mouth See admin instructions. Take 4 capsules (2000 mg) by mouth 1 hour prior to dental work 09/03/15   [provider]  atorvastatin (LIPITOR) 80 MG tablet Take 1 tablet (80 mg total) by mouth daily. 05/16/19   Sueanne Margarita, MD  diltiazem (CARDIZEM CD) 300 MG 24  hr capsule Take 1 capsule (300 mg total) by mouth daily. 09/21/19   Sueanne Margarita, MD  ezetimibe (ZETIA) 10 MG tablet Take 1 tablet (10 mg total) by mouth daily. 06/28/19 10/15/20  Sueanne Margarita, MD  furosemide (LASIX) 20 MG tablet Take 20 mg by mouth daily.     [provider]  hydrOXYzine (ATARAX/VISTARIL) 25 MG tablet Take 25 mg by mouth as directed.    [provider]  losartan (COZAAR) 100 MG tablet Take 1 tablet (100 mg total) by mouth daily. Patient taking differently: Take 50 mg by mouth daily. 08/08/19   Sueanne Margarita, MD  Melatonin 10 MG TABS Take 10 mg by mouth at bedtime as needed (for sleep).     [provider]  nitroGLYCERIN (NITROSTAT) 0.4 MG SL tablet Place 0.4 mg under the tongue every 5 (five) minutes as needed for chest pain.     [provider]  omeprazole (PRILOSEC) 20 MG capsule Take 20 mg by mouth daily as needed (acid reflux/indigestion.).     [provider]  predniSONE (DELTASONE) 5 MG tablet Take 5 mg by mouth daily with breakfast.    [provider]  rivaroxaban (XARELTO) 20 MG TABS tablet Take 20 mg by mouth daily with supper.    [provider]  triamcinolone ointment (KENALOG) 0.1 % Apply 1 application topically 2 (two) times daily as needed (affected areas of feet).  08/01/19   [provider]  TURMERIC PO Take 1,000 mg by mouth 2 (two) times daily.    [provider]    Physical Exam: Vitals:   08/04/21 2300 08/05/21 0000 08/05/21 0040 08/05/21 0148  BP: 130/75 131/68 134/73 127/72  Pulse: 87 77 89 78  Resp: (!) 21 (!) 21 (!) 22 17  Temp:   98.5 F (36.9 C) 97.8 F (36.6 C)  TempSrc:   Oral Oral  SpO2: 97% 97% 99% 93%  Weight:      Height:        Physical Exam Vitals and nursing note reviewed.  Constitutional:      General: She is not in acute distress.    Appearance: Normal appearance. She is not ill-appearing, toxic-appearing or diaphoretic.  HENT:     Head:  Normocephalic and atraumatic.     Nose: Nose normal. No rhinorrhea.  Eyes:     General:        Right eye: No discharge.        Left eye: No discharge.  Cardiovascular:     Rate and Rhythm: Normal rate. Rhythm irregular.     Pulses: Normal pulses.     Heart sounds: Murmur heard.  Pulmonary:     Effort: Pulmonary effort is normal.     Breath sounds: Examination of the right-lower field reveals rales. Examination of the left-lower field reveals rales. Rales present.  Abdominal:     General: Bowel sounds are normal. There is no distension.     Tenderness: There is no abdominal tenderness. There is no guarding or rebound.  Musculoskeletal:     Right lower leg: No edema.     Left lower leg: No edema.  Skin:    General: Skin is warm and dry.     Capillary Refill: Capillary refill takes less than 2 seconds.  Neurological:     General: No focal deficit present.     Mental Status: She is alert and oriented to person, place, and time.     Labs on Admission: I have personally reviewed following labs and imaging studies  CBC: Recent Labs  Lab 08/04/21 2115  WBC 17.9*  NEUTROABS 16.6*  HGB 9.8*  HCT 29.7*  MCV 93.1  PLT 619   Basic Metabolic Panel: Recent Labs  Lab 08/04/21 2115  NA 136  K 3.4*  CL 106  CO2 18*  GLUCOSE 133*  BUN 36*  CREATININE 1.21*  CALCIUM 9.0   GFR: Estimated Creatinine Clearance: 39.4 mL/min (A) (by C-G formula based on SCr of 1.21 mg/dL (H)). Liver Function Tests: Recent Labs  Lab 08/04/21 2115  AST 25  ALT 18  ALKPHOS 70  BILITOT 0.9  PROT 6.5  ALBUMIN 3.0*   No results for input(s): LIPASE, AMYLASE in the last 168 hours. No results for input(s): AMMONIA in the last 168 hours. Coagulation Profile: No results for input(s): INR, PROTIME in the last 168 hours. Cardiac Enzymes: No results for input(s): CKTOTAL, CKMB, CKMBINDEX, TROPONINI in the last 168 hours. BNP (last 3 results) No results for input(s): PROBNP in the last 8760  hours. HbA1C: No results for input(s): HGBA1C in the last 72 hours. CBG: No results for input(s): GLUCAP in the last 168 hours. Lipid Profile: No results for input(s): CHOL, HDL, LDLCALC, TRIG, CHOLHDL, LDLDIRECT in the last 72 hours. Thyroid Function Tests: No results for input(s): TSH, T4TOTAL, FREET4, T3FREE, THYROIDAB in the last 72 hours. Anemia Panel: No results for input(s): VITAMINB12, FOLATE, FERRITIN, TIBC, IRON, RETICCTPCT in the last 72 hours. Urine analysis:    Component Value Date/Time   COLORURINE YELLOW 12/10/2016 1037   APPEARANCEUR CLEAR 12/10/2016 1037   LABSPEC 1.012 12/10/2016 1037   PHURINE 5.0 12/10/2016 1037   GLUCOSEU NEGATIVE 12/10/2016 1037   HGBUR NEGATIVE 12/10/2016 1037   [place] 12/10/2016 1037   KETONESUR NEGATIVE 12/10/2016 1037   PROTEINUR NEGATIVE 12/10/2016 1037   UROBILINOGEN 0.2 01/16/2015 0820   NITRITE NEGATIVE 12/10/2016 1037   LEUKOCYTESUR NEGATIVE 12/10/2016 1037    Radiological Exams on Admission: I have personally reviewed images CT Angio Chest PE W and/or Wo Contrast  Result Date: 08/04/2021 CLINICAL DATA:  PE suspected, high prob Recently returned from [place], hypoxia, exertional shortness of breath, left leg pain, also rigors and some malaise EXAM: CT ANGIOGRAPHY CHEST WITH CONTRAST TECHNIQUE: Multidetector CT imaging of the chest was performed using the standard protocol  during bolus administration of intravenous contrast. Multiplanar CT image reconstructions and MIPs were obtained to evaluate the vascular anatomy. CONTRAST:  81mL OMNIPAQUE IOHEXOL 350 MG/ML SOLN COMPARISON:  11/29/2016 FINDINGS: Cardiovascular: Heart is enlarged. Prior TAVR. Multi-vessel coronary artery calcifications. Diffuse aortic calcifications. No aneurysm. No filling defects in the pulmonary arteries to suggest pulmonary emboli. Mediastinum/Nodes: No mediastinal, hilar, or axillary adenopathy. Trachea and esophagus are unremarkable. Thyroid  unremarkable. Lungs/Pleura: No effusions. Tree-in-bud nodular densities seen in the upper lobes which could reflect small airways disease/alveolitis. No confluent airspace opacities. Upper Abdomen: Gallstones within the gallbladder. Musculoskeletal: Prior left breast implant. No acute bony abnormality. Review of the MIP images confirms the above findings. IMPRESSION: No evidence of pulmonary embolus. Subtle scattered tree-in-bud nodular densities in the upper lobes bilaterally may reflect small airways disease/alveolitis. No confluent areas of consolidation. Cardiomegaly, coronary artery disease. Cholelithiasis. Aortic Atherosclerosis (ICD10-I70.0). Electronically Signed   By: Rolm Baptise M.D.   On: 08/04/2021 22:49   US Venous Img Lower Unilateral Left  Result Date: 08/04/2021 CLINICAL DATA:  Left leg pain EXAM: LEFT LOWER EXTREMITY VENOUS DOPPLER ULTRASOUND TECHNIQUE: Gray-scale sonography with compression, as well as color and duplex ultrasound, were performed to evaluate the deep venous system(s) from the level of the common femoral vein through the popliteal and proximal calf veins. COMPARISON:  None. FINDINGS: VENOUS Normal compressibility of the common femoral, superficial femoral, and popliteal veins, as well as the visualized calf veins. Visualized portions of profunda femoral vein and great saphenous vein unremarkable. No filling defects to suggest DVT on grayscale or color Doppler imaging. Doppler waveforms show normal direction of venous flow, normal respiratory plasticity and response to augmentation. Limited views of the contralateral common femoral vein are unremarkable. OTHER None. Limitations: none IMPRESSION: Negative. Electronically Signed   By: Rolm Baptise M.D.   On: 08/04/2021 23:36    EKG: I have personally reviewed EKG: afib   Assessment/Plan Principal Problem:   Acute respiratory failure with hypoxia (HCC) Active Problems:   CAP (community acquired pneumonia)   Sepsis with  acute organ dysfunction (HCC)   Pulmonary HTN (HCC)   Acute on chronic anemia   HTN (hypertension)   GERD (gastroesophageal reflux disease)   Permanent atrial fibrillation (HCC)   S/p TAVR (transcatheter aortic valve replacement), bioprosthetic 12/14/16   Chronic anticoagulation - on Xarelto for afib    Acute respiratory failure with hypoxia ([place]) Admit to medical telemetry bed.  Continue supplemental oxygen.  Wean as tolerated.  CAP (community acquired pneumonia) Assumption is that she has community-acquired pneumonia despite no radiographic evidence.  Continue with Rocephin and Zithromax.  Check Legionella and strep pneumo antigens.  Check baseline procalcitonin.  Blood cultures were obtained in the ER.  Sepsis with acute organ dysfunction [facility]) Patient has evidence of acute organ dysfunction from sepsis due to elevated white count, hypoxia, lactic acidosis.  Currently unclear if she is having acute diastolic heart failure or that this is related to sepsis from her pneumonia.  We will hold off on giving her any IV fluids for now.  Pulmonary HTN ([place]) Patient's last echo was about 6 months ago.  She was noted to have worsening pulmonary hypertension.  Patient was supposed to get PFTs but these were never completed.  We will go ahead and order PFTs with DLCO.  We will update her echo.  Acute on chronic anemia Patient has acute anemia compared to labs drawn about a week and a half ago.  She denies any black or tarry stools.  We will check Hemoccult stools.  Check iron studies.  Continue Protonix.  HTN (hypertension) Stable.  We will hold her ARB due to elevated serum creatinine.  GERD (gastroesophageal reflux disease) Continue Protonix.  Permanent atrial fibrillation (HCC) Chronic.  Is on Xarelto for CVA prophylaxis.  Is on Cardizem for rate control.  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  Chronic anticoagulation - on Xarelto for  afib Continue Xarelto for A. fib.  DVT prophylaxis: Xarelto Code Status: Full Code Family Communication: no family at bedside  Disposition Plan: return home  Consults called: none  Admission status: Inpatient, Telemetry bed   Kristopher Oppenheim, DO [facility] 08/05/2021, 2:37 AM

## 2021-08-05 NOTE — ED Notes (Signed)
Report given to Karla RN

## 2021-08-05 NOTE — Assessment & Plan Note (Signed)
Continue Xarelto for A. fib.

## 2021-08-05 NOTE — Assessment & Plan Note (Addendum)
-   Subtle upper lobe opacities are presumed to be infection at this time especially given recent travel, mild cough on admission, leukocytosis, and chills at home. - Initial procalcitonin also elevated, 3.83 - also checking RVP - continue Rocephin and azithromycin - Strep urinary antigen negative.  Follow-up Legionella - Likely can discharge home on Thursday if ongoing clinical improvement

## 2021-08-05 NOTE — Assessment & Plan Note (Signed)
Patient has acute anemia compared to labs drawn about a week and a half ago.  She denies any black or tarry stools.  We will check Hemoccult stools.  Check iron studies.  Continue Protonix.

## 2021-08-05 NOTE — Assessment & Plan Note (Addendum)
Patient's last echo was about 6 months ago.  She was noted to have worsening pulmonary hypertension.  Patient was supposed to get PFTs but these were never completed.   -We will still need outpatient PFTs - Follow-up echo repeated this hospitalization

## 2021-08-05 NOTE — Progress Notes (Signed)
PHARMACY - PHYSICIAN COMMUNICATION CRITICAL VALUE ALERT - BLOOD CULTURE IDENTIFICATION (BCID)  [name] is an 85 y.o. female who presented to [facility] on 08/04/2021 with a chief complaint of shakes.  Name of physician (or Provider) Contacted: Opyd  Current antibiotics: ampicillin  Changes to prescribed antibiotics recommended:  Recommendations accepted by provider  Results for orders placed or performed during the hospital encounter of 08/04/21  Blood Culture ID Panel (Reflexed) (Collected: 08/04/2021 10:05 PM)  Result Value Ref Range   Enterococcus faecalis DETECTED (A) NOT DETECTED   Enterococcus Faecium NOT DETECTED NOT DETECTED   Listeria monocytogenes NOT DETECTED NOT DETECTED   Staphylococcus species NOT DETECTED NOT DETECTED   Staphylococcus aureus (BCID) NOT DETECTED NOT DETECTED   Staphylococcus epidermidis NOT DETECTED NOT DETECTED   Staphylococcus lugdunensis NOT DETECTED NOT DETECTED   Streptococcus species NOT DETECTED NOT DETECTED   Streptococcus agalactiae NOT DETECTED NOT DETECTED   Streptococcus pneumoniae NOT DETECTED NOT DETECTED   Streptococcus pyogenes NOT DETECTED NOT DETECTED   A.calcoaceticus-baumannii NOT DETECTED NOT DETECTED   Bacteroides fragilis NOT DETECTED NOT DETECTED   Enterobacterales NOT DETECTED NOT DETECTED   Enterobacter cloacae complex NOT DETECTED NOT DETECTED   Escherichia coli NOT DETECTED NOT DETECTED   Klebsiella aerogenes NOT DETECTED NOT DETECTED   Klebsiella oxytoca NOT DETECTED NOT DETECTED   Klebsiella pneumoniae NOT DETECTED NOT DETECTED   Proteus species NOT DETECTED NOT DETECTED   Salmonella species NOT DETECTED NOT DETECTED   Serratia marcescens NOT DETECTED NOT DETECTED   Haemophilus influenzae NOT DETECTED NOT DETECTED   Neisseria meningitidis NOT DETECTED NOT DETECTED   Pseudomonas aeruginosa NOT DETECTED NOT DETECTED   Stenotrophomonas maltophilia NOT DETECTED NOT DETECTED   Candida albicans NOT DETECTED  NOT DETECTED   Candida auris NOT DETECTED NOT DETECTED   Candida glabrata NOT DETECTED NOT DETECTED   Candida krusei NOT DETECTED NOT DETECTED   Candida parapsilosis NOT DETECTED NOT DETECTED   Candida tropicalis NOT DETECTED NOT DETECTED   Cryptococcus neoformans/gattii NOT DETECTED NOT DETECTED   Vancomycin resistance NOT DETECTED NOT DETECTED    Einar Grad 08/05/2021  9:17 PM

## 2021-08-05 NOTE — Assessment & Plan Note (Signed)
Chronic. 

## 2021-08-05 NOTE — ED Notes (Signed)
[facility] her for transport

## 2021-08-05 NOTE — Assessment & Plan Note (Addendum)
Patient has evidence of acute organ dysfunction from sepsis due to elevated white count, hypoxia, lactic acidosis.  - see CAP

## 2021-08-05 NOTE — Assessment & Plan Note (Addendum)
Chronic.  Is on Xarelto.  Is on Cardizem for rate control.

## 2021-08-05 NOTE — Assessment & Plan Note (Addendum)
-   Found to be hypoxic initially, 89% and placed on oxygen - Has been weaned down to room air

## 2021-08-05 NOTE — Progress Notes (Signed)
Progress Note    [name]   VQQ:[id number]  DOB: [birth year]  DOA: 08/04/2021     0 Date of Service: 08/05/2021   Clinical Course Ms. Fettes is an 85 yo female with PMH A. fib on Xarelto, history of TAVR, hypertension, pulmonary hypertension, GERD, diastolic CHF who presented to the ER with "shakes" at home.  She denied any subjective fevers or sweating at home.  She recently returned from a trip to [place] about 3 weeks ago. She had associated shortness of breath but denied any cough, hemoptysis, nausea, vomiting, diarrhea.  She also denied any leg swelling and has been compliant on Xarelto.  She initially required 2 L oxygen for hypoxia of 89% on admission. She underwent CT angio chest which was negative for PE and showed subtle scattered tree-in-bud nodular densities in the upper lobes bilaterally.  No obvious areas of consolidation.  Initial troponin was 53 followed by 72. She had no CP.  EKG showed A. fib, normal rate, no ischemic changes.  She was admitted for presumed pneumonia, started on Rocephin and azithromycin.  Assessment and Plan * Acute respiratory failure with hypoxia (HCC)-resolved as of 08/05/2021 - Found to be hypoxic initially, 89% and placed on oxygen - Has been weaned down to room air  Acute on chronic anemia Patient has acute anemia compared to labs drawn about a week and a half ago.  She denies any black or tarry stools.  We will check Hemoccult stools.  Check iron studies.  Continue Protonix.  Severe sepsis [facility]) Patient has evidence of acute organ dysfunction from sepsis due to elevated white count, hypoxia, lactic acidosis.  - see CAP  CAP (community acquired pneumonia) - Subtle upper lobe opacities are presumed to be infection at this time especially given recent travel, mild cough on admission, leukocytosis, and chills at home. - Initial procalcitonin also elevated, 3.83 - also checking RVP - continue Rocephin and azithromycin - Strep  urinary antigen negative.  Follow-up Legionella - Likely can discharge home on Thursday if ongoing clinical improvement  Pulmonary HTN ([place]) Patient's last echo was about 6 months ago.  She was noted to have worsening pulmonary hypertension.  Patient was supposed to get PFTs but these were never completed.   -We will still need outpatient PFTs - Follow-up echo repeated this hospitalization  HTN (hypertension) Stable.  We will hold her ARB due to elevated serum creatinine.  Demand ischemia (HCC) - Mild troponin leak expected in setting of severe sepsis - Repeat troponin this afternoon - No complaints of chest pain  Chronic anticoagulation - on Xarelto for afib Continue Xarelto for A. fib.  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  Permanent atrial fibrillation (HCC) Chronic.  Is on Xarelto.  Is on Cardizem for rate control.  GERD (gastroesophageal reflux disease) Continue Protonix.    Subjective:  Patient states she is feeling better this morning.  No further chills.  Denies any cough, chest pain, shortness of breath.  Objective Vitals:   08/05/21 0500 08/05/21 0555 08/05/21 0808 08/05/21 1137  BP:  130/80 133/77 130/65  Pulse:  83 93 (!) 43  Resp:  18 18 16   Temp:  98 F (36.7 C) 97.9 F (36.6 C) 98.1 F (36.7 C)  TempSrc:  Oral Oral Oral  SpO2:  92% 100% 95%  Weight: 85.1 kg     Height:       85.1 kg  Vital signs were reviewed and unremarkable.   Exam Physical Exam Constitutional:  Appearance: She is well-developed.  HENT:     Head: Normocephalic and atraumatic.     Mouth/Throat:     Mouth: Mucous membranes are moist.  Eyes:     Extraocular Movements: Extraocular movements intact.  Cardiovascular:     Rate and Rhythm: Normal rate and regular rhythm.  Pulmonary:     Effort: Pulmonary effort is normal.     Breath sounds: Normal breath sounds. No wheezing.  Abdominal:     General: Bowel sounds are normal.  There is no distension.     Palpations: Abdomen is soft.     Tenderness: There is no abdominal tenderness.  Musculoskeletal:        General: Normal range of motion.     Cervical back: Normal range of motion and neck supple.  Skin:    General: Skin is warm and dry.  Neurological:     General: No focal deficit present.     Mental Status: She is alert.  Psychiatric:        Mood and Affect: Mood normal.        Behavior: Behavior normal.     Labs / Other Information My review of labs, imaging, notes and other tests shows no new significant findings.    Disposition Plan: Status is: Inpatient  Remains inpatient appropriate because: Ongoing IV antibiotics and further trending of procalcitonin and renal function    Time spent: Greater than 50% of the 35 minute visit was spent in counseling/coordination of care for the patient as laid out in the A&P.  Dwyane Dee, MD [facility] 08/05/2021, 12:37 PM

## 2021-08-05 NOTE — Subjective & Objective (Signed)
CC: shaking chills HPI: 85 year old white female with a history of A. fib on chronic Xarelto, history of TAVR, hypertension, history of pulmonary hypertension, reflux reported history of chronic diastolic heart failure presents to the ER with sudden onset of shaking chills.  Patient denies any fever.  Patient states that she recently came back from [place] about 3 weeks ago.  Patient was born [place]  She grew up in [place].  She moved from [place] to [place] about 25 years ago.  Patient denies any fevers at home.  Was short of breath.  No cough.  No nausea, diarrhea or vomiting.  Patient denies any leg swelling.  She states that she has been taking all of her medications.  This includes her Xarelto for anticoagulation.  Patient was placed on 2 L of oxygen.  Due to room air saturations of 89%.  CTPA was negative for PE.  Her laboratory evaluation demonstrated serum bicarbonate of 18, BUN of 36, creatinine 1.2.  Her baseline creatinine is about 0.8-1.0.  CBC showed a white count of 17.9, hemoglobin of 9.8 which is a 3 g drop in about a week and a half.  Initial troponin of 53 with a second troponin of 72.  BNP was elevated at 474.  Lactic acid was 2.6.  To the lactic acidosis, hypoxia and sudden onset of chills associated with leukocytosis, patient started on antibiotics with Rocephin and Zithromax.  Assumption was made that she had community-acquired pneumonia despite no radiographic evidence of pneumonia.  Patient transported to [facility] for further evaluation.

## 2021-08-06 ENCOUNTER — Encounter (HOSPITAL_COMMUNITY): Payer: Medicare Other

## 2021-08-06 DIAGNOSIS — J9601 Acute respiratory failure with hypoxia: Secondary | ICD-10-CM

## 2021-08-06 LAB — MAGNESIUM: Magnesium: 2 mg/dL (ref 1.7–2.4)

## 2021-08-06 LAB — BASIC METABOLIC PANEL
Anion gap: 8 (ref 5–15)
BUN: 23 mg/dL (ref 8–23)
CO2: 23 mmol/L (ref 22–32)
Calcium: 8.9 mg/dL (ref 8.9–10.3)
Chloride: 107 mmol/L (ref 98–111)
Creatinine, Ser: 0.88 mg/dL (ref 0.44–1.00)
GFR, Estimated: >60 mL/min (ref >60)
Glucose, Bld: 122 mg/dL — ABNORMAL HIGH (ref 70–99)
Potassium: 3.6 mmol/L (ref 3.5–5.1)
Sodium: 138 mmol/L (ref 135–145)

## 2021-08-06 LAB — CBC WITH DIFFERENTIAL/PLATELET
Abs Immature Granulocytes: 0.04 10*3/uL (ref 0.00–0.07)
Basophils Absolute: 0 10*3/uL (ref 0.0–0.1)
Basophils Relative: 0 %
Eosinophils Absolute: 0 10*3/uL (ref 0.0–0.5)
Eosinophils Relative: 0 %
HCT: 29.3 % — ABNORMAL LOW (ref 36.0–46.0)
Hemoglobin: 9.5 g/dL — ABNORMAL LOW (ref 12.0–15.0)
Immature Granulocytes: 0 %
Lymphocytes Relative: 7 %
Lymphs Abs: 0.7 10*3/uL (ref 0.7–4.0)
MCH: 30.7 pg (ref 26.0–34.0)
MCHC: 32.4 g/dL (ref 30.0–36.0)
MCV: 94.8 fL (ref 80.0–100.0)
Monocytes Absolute: 0.4 10*3/uL (ref 0.1–1.0)
Monocytes Relative: 4 %
Neutro Abs: 9.8 10*3/uL — ABNORMAL HIGH (ref 1.7–7.7)
Neutrophils Relative %: 89 %
Platelets: 149 10*3/uL — ABNORMAL LOW (ref 150–400)
RBC: 3.09 MIL/uL — ABNORMAL LOW (ref 3.87–5.11)
RDW: 14.8 % (ref 11.5–15.5)
WBC: 11.1 10*3/uL — ABNORMAL HIGH (ref 4.0–10.5)
nRBC: 0 % (ref 0.0–0.2)

## 2021-08-06 LAB — PROCALCITONIN: Procalcitonin: 3.3 ng/mL

## 2021-08-06 LAB — LEGIONELLA PNEUMOPHILA SEROGP 1 UR AG: L. pneumophila Serogp 1 Ur Ag: NEGATIVE

## 2021-08-06 MED ORDER — SODIUM CHLORIDE 0.9 % IV SOLN
2.0000 g | Freq: Two times a day (BID) | INTRAVENOUS | Status: DC
  Administered 2021-08-06 – 2021-08-12 (×13): 2 g via INTRAVENOUS
  Filled 2021-08-06 (×14): qty 20

## 2021-08-06 MED ORDER — SODIUM CHLORIDE 0.9 % IV SOLN
2.0000 g | INTRAVENOUS | Status: DC
  Administered 2021-08-06 – 2021-08-07 (×5): 2 g via INTRAVENOUS
  Filled 2021-08-06 (×8): qty 2000

## 2021-08-06 NOTE — Progress Notes (Signed)
PHARMACY NOTE:  ANTIMICROBIAL RENAL DOSAGE ADJUSTMENT  Current antimicrobial regimen includes a mismatch between antimicrobial dosage and estimated renal function.  As per policy approved by the Pharmacy & Therapeutics and Medical Executive Committees, the antimicrobial dosage will be adjusted accordingly.  Current antimicrobial dosage:  Ampicillin 2g IV q6h  Indication: E.faecalis bacteremia  Renal Function:54.3 ml/min  Estimated Creatinine Clearance: 54.3 mL/min (by C-G formula based on SCr of 0.88 mg/dL). []      On intermittent HD, scheduled: []      On CRRT    Antimicrobial dosage has been changed to:  Ampicillin 2g IV q4h  Additional comments:   Thank you for allowing pharmacy to be a part of this patient's care.  Lestine Box, PharmD PGY2 Infectious Diseases Pharmacy Resident   Please check [email or web address] for unit-specific pharmacy phone numbers

## 2021-08-06 NOTE — Consult Note (Addendum)
I have seen and examined the patient. I have personally reviewed the clinical findings, laboratory findings, microbiological data and imaging studies. The assessment and treatment plan was discussed with the Resident, Delene Ruffini.  I agree with her/his recommendations except following additions/corrections.  85 Y O Female with PMH of multiple comorbidities (AV stenosis status post TAVR, dCHF/Pul HTN CAD, breast cancer, TIA, sleep apnea,fibromyalgia, and atrial fibrillation on Xarelto, left reverse shoulder arthroplasty, rt shoulder arthroplasty, rt and left hip arthroplasty, left knee arthroplasty) who presented to the ED with shaking chills, exertional shortness of breath, fatigue, left leg pain  for approx. a week.  She had a visit to [place] 3 weeks ago  ( Born [place] grew in [place] and moved to [place] 25 years ago). She also reportedly saw an orthopedist for left knee pain and had unremarkable Xray.   Denies any recent GU or GI symptoms. No pain/swelling/tenderness at the peripheral joints including prosthetic joints (left knee, left hip/rt hip  and rt/left  shoulder )  At ED, on 2 L [place] for oxygen sat in 80s  Work up remarkable for AKI with Cr 1.2, WBC 17.9, BNP 474, LA 2.6  Blood cx 10/18 both sets GPC , BCID as E faecalis ( no Vanc resistance)  Duplex negative for DVT of left leg  CTPA was negative for PE. Tree-in-bud nodular densities seen in the upper lobes which could reflect small airways disease/alveolitis. TTE with no evidence of vegetation/endocarditis or severe valvulopathy  Started on bsa (ceftriaxone and azithromycin) for concerns of pna   Exam Elderly lady sitting up in chair and appears comfortable HEENT WNL Chest - bilateral equal air entry CVS- s1s2, Irregular rhythm, systolic murmur+ Abdomen -soft and non tender  Extremities - no signs of septic joints in peripheral joints, pedal edema bilaterally  Skin - no rashes Neuro - awake, alert and oriented and grossly non  focal   Plan Continue IV ampicillin 2g iv q4 adjusted to her renal function  Add ceftriaxone  2g iv q12 for high concerns of endocarditis  Repeat 2 sets of blood cx today ( ordered  TEE ( planned for 10/24 per Cards) Monitor CBC and CMP Following   Rosiland Oz, MD Infectious Disease Physician [facility] [address] [place], [place] [address] Phone: [phone]  Fax: [place]    Date of Admission:  08/04/2021   Total days of antibiotics 2        Day 2 Ampicillin        Day 1 Rocephin              Reason for Consult: e faecalis bacteremia    Referring Provider: Terrilee Croak, MD, Johnnye Lana consult  Primary Care Provider:  Maurice Small, MD  Assessment: # E faecalis bacteremia with sepsis and acute organ dysfunction  - Complicated infection given Hx TAVR,  left knee replacement, left and right hip arthroplasty, left and right shoulder arthroplasty.  - ECHO from 10/19 showing tricuspid regurg mild to moderate which is worsened compared to previous assessment in May 2022. Concern for possible IE.  No current signs of infection/inflammation in her joints. - It is possible that she initially had a polymicrobial infection since it is uncommon for patients to present with sepsis from isolated E faecalis infection; however, she has improved clinically after initial abx regimen. She is now on appropriate antibiotics Ampicillin and Rocephin.  - She appears  to be improving overall. She has remained afebrile, blood pressure is stable. She is breathing comfortably on room air, though she does endorse some continued shortness of breath. Her WBC has trended down from 17.9 to 11.1, and she has been afebrile.    Plan: Patient appears to be improving clinically. She has remained afebrile, hemodynamically stable and breathing comfortably on RA. WBC count trending down. Will continue Ampicillin   Ceftriaxoneadded for possible IE given worsened tricuspid valve regurgitation and severity of initial infection.  Patient will need TEE to evaluate for possible IE. F/u sensitivites from initial Cx Repeat Cx ordered to ensure clearance. Will need to f/u results.  Continue to monitor with BMP and CBC  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   CAP (community acquired pneumonia)   Severe sepsis (HCC)   Chronic anticoagulation - on Xarelto for afib   Acute on chronic anemia   Demand ischemia (HCC)   Scheduled Meds:  atorvastatin  80 mg Oral Daily   diltiazem  240 mg Oral q morning   pantoprazole  40 mg Oral Daily   predniSONE  5 mg Oral Q breakfast   rivaroxaban  20 mg Oral Q supper   Continuous Infusions:  ampicillin (OMNIPEN) IV 2 g (08/06/21 0502)   PRN Meds:.acetaminophen **OR** acetaminophen, albuterol, melatonin  HPI: [name] is a 85 y.o. female with a hx of afib on Xarelto, hx of TAVR, HTN, and pulmonary htn, and diastolic heart failure, who presented with complaints of sudden onset shaking chills. Denied fever. Endorses SOB. No cough. No N/V/D. Denies LEE. She was saturating in 80"s on room air, was placed on [facility]. Slight increase in Cr was noted. Elevated WBC 17.9 initially. She was initially started on Rocephin and Zithromax for preseumed CAP despite negative CXR.    Review of Systems: Review of Systems  Constitutional:  Negative for chills and fever.  Respiratory: Negative.    Cardiovascular: Negative.   Gastrointestinal: Negative.  Negative for diarrhea, nausea and vomiting.  Genitourinary: Negative.  Negative for flank pain.  Musculoskeletal: Negative.  Negative for back pain, joint pain, myalgias and neck pain.  Skin: Negative.   Neurological: Negative.  Negative for dizziness and  headaches.  Psychiatric/Behavioral: Negative.     Past Medical History:  Diagnosis Date   Anginal pain ([place])    Aortic stenosis    severe AS with moderate AR by echo with normal LVF   Arthritis    CAD in native artery, non obstructive cath 11/2016.  12/16/2016   Cancer ([place])    left breast mastectomy(cancer)-surgery only   Chronic diastolic congestive heart failure ([place])    Complication of anesthesia    extremely claustophobic(only occurs with narcotic medications as stated per pt)   Diverticulosis    Dysrhythmia    Afib   Fibromyalgia    GERD (gastroesophageal reflux disease)    Hard of hearing    wears hearing aides    History of breast cancer no recurrence   dx 2004   s/p  left breast mastectomy w/ node dissection/   no chemo or radiation   History of TIA (transient ischemic attack)    per scan--  no residual   Hyperlipidemia    Hypertension    Nocturia    OSA on CPAP     severe/  AHI 34/hr  Permanent atrial fibrillation (HCC)    on Xarelto for CHADS2VASC score of 5   Pulmonary HTN (HCC)    PASP 1mmHg by echo 11/2016   Urge urinary incontinence    Wears glasses     Social History   Tobacco Use   Smoking status: Former    Packs/day: 0.25    Years: 8.00    Pack years: 2.00    Types: Cigarettes    Quit date: 10/18/1966    Years since quitting: 54.8   Smokeless tobacco: Never  Vaping Use   Vaping Use: Never used  Substance Use Topics   Alcohol use: No   Drug use: No    Family History  Problem Relation Age of Onset   Heart disease Mother    Heart disease Father    Allergies  Allergen Reactions   Beta Adrenergic Blockers Shortness Of Breath   Other     Narcotic pain medications - anxiety attacks   Oxycodone Shortness Of Breath and Other (See Comments)    Tolerates Dilaudid, Fentanyl   Sotalol Hcl Shortness Of Breath and Other (See Comments)   Tramadol Anxiety and Other (See Comments)    Can take if absolutely necessary   Clonidine Derivatives  Palpitations    bradycardia   Crestor [Rosuvastatin Calcium] Other (See Comments)    MYALGIAS   Latex Rash    Per patient-bandaids- no problems with gloves   Rosuvastatin Calcium Nausea Only   Rosuvastatin    Tape Rash     bandaids     OBJECTIVE: Blood pressure (!) 161/86, pulse (!) 102, temperature 98.2 F (36.8 C), temperature source Oral, resp. rate 18, height 5\' 7"  (1.702 m), weight 85.1 kg, SpO2 (!) 85 %.  Physical Exam Constitutional:      Appearance: She is well-developed and normal weight.  HENT:     Head: Normocephalic and atraumatic.  Cardiovascular:     Rate and Rhythm: Normal rate and regular rhythm.     Pulses: Normal pulses.  Pulmonary:     Effort: Pulmonary effort is normal.     Breath sounds: Normal breath sounds. No decreased breath sounds.  Abdominal:     General: Bowel sounds are normal.     Palpations: Abdomen is soft. There is no splenomegaly.     Tenderness: There is no abdominal tenderness. There is no rebound.  Musculoskeletal:        General: Normal range of motion.     Right lower leg: No tenderness. Edema present.     Left lower leg: No tenderness. Edema present.  Skin:    General: Skin is warm and dry.     Findings: No rash.  Neurological:     General: No focal deficit present.     Mental Status: She is alert and oriented to person, place, and time.  Psychiatric:        Mood and Affect: Mood normal.        Behavior: Behavior normal.    Lab Results Lab Results  Component Value Date   WBC 11.1 (H) 08/06/2021   HGB 9.5 (L) 08/06/2021   HCT 29.3 (L) 08/06/2021   MCV 94.8 08/06/2021   PLT 149 (L) 08/06/2021    Lab Results  Component Value Date   CREATININE 0.88 08/06/2021   BUN 23 08/06/2021   NA 138 08/06/2021   K 3.6 08/06/2021   CL 107 08/06/2021   CO2 23 08/06/2021    Lab Results  Component Value Date   ALT  15 08/05/2021   AST 20 08/05/2021   ALKPHOS 61 08/05/2021   BILITOT 1.1 08/05/2021     Microbiology: Recent  Results (from the past 240 hour(s))  Resp Panel by RT-PCR (Flu A&B, Covid) Nasopharyngeal Swab     Status: None   Collection Time: 08/04/21  9:38 PM   Specimen: Nasopharyngeal Swab; Nasopharyngeal(NP) swabs in vial transport medium  Result Value Ref Range Status   SARS Coronavirus 2 by RT PCR NEGATIVE NEGATIVE Final    Comment: (NOTE) SARS-CoV-2 target nucleic acids are NOT DETECTED.  The SARS-CoV-2 RNA is generally detectable in upper respiratory specimens during the acute phase of infection. The lowest concentration of SARS-CoV-2 viral copies this assay can detect is 138 copies/mL. A negative result does not preclude SARS-Cov-2 infection and should not be used as the sole basis for treatment or other patient management decisions. A negative result may occur with  improper specimen collection/handling, submission of specimen other than nasopharyngeal swab, presence of viral mutation(s) within the areas targeted by this assay, and inadequate number of viral copies(<138 copies/mL). A negative result must be combined with clinical observations, patient history, and epidemiological information. The expected result is Negative.  Fact Sheet for Patients:  EntrepreneurPulse.com.au  Fact Sheet for Healthcare Providers:  IncredibleEmployment.be  This test is no t yet approved or cleared by the [place] FDA and  has been authorized for detection and/or diagnosis of SARS-CoV-2 by FDA under an Emergency Use Authorization (EUA). This EUA will remain  in effect (meaning this test can be used) for the duration of the COVID-19 declaration under Section 564(b)(1) of the Act, 21 U.S.C.section 360bbb-3(b)(1), unless the authorization is terminated  or revoked sooner.       Influenza A by PCR NEGATIVE NEGATIVE Final   Influenza B by PCR NEGATIVE NEGATIVE Final    Comment: (NOTE) The Xpert Xpress SARS-CoV-2/FLU/RSV plus assay is intended as an aid in the  diagnosis of influenza from Nasopharyngeal swab specimens and should not be used as a sole basis for treatment. Nasal washings and aspirates are unacceptable for Xpert Xpress SARS-CoV-2/FLU/RSV testing.  Fact Sheet for Patients: EntrepreneurPulse.com.au  Fact Sheet for Healthcare Providers: IncredibleEmployment.be  This test is not yet approved or cleared by the [place] FDA and has been authorized for detection and/or diagnosis of SARS-CoV-2 by FDA under an Emergency Use Authorization (EUA). This EUA will remain in effect (meaning this test can be used) for the duration of the COVID-19 declaration under Section 564(b)(1) of the Act, 21 U.S.C. section 360bbb-3(b)(1), unless the authorization is terminated or revoked.  Performed at [facility], [address] [place], [place] [address]   Blood culture (routine x 2)     Status: None (Preliminary result)   Collection Time: 08/04/21  9:50 PM   Specimen: BLOOD RIGHT FOREARM  Result Value Ref Range Status   Specimen Description   Final    BLOOD RIGHT FOREARM Performed at [facility], [address] [place], [place] [address]    Special Requests   Final    BOTTLES DRAWN AEROBIC AND ANAEROBIC Blood Culture adequate volume Performed at [facility], [address] [place], [place] [address]    Culture  Setup Time   Final    GRAM POSITIVE COCCI IN CHAINS IN BOTH AEROBIC AND ANAEROBIC BOTTLES Performed at [facility], [place] [address] [place], [place] [address]    Culture PENDING  Incomplete   Report Status  PENDING  Incomplete  Blood culture (routine x 2)     Status: None (Preliminary result)   Collection Time: 08/04/21 10:05 PM   Specimen: BLOOD RIGHT WRIST  Result Value Ref Range Status   Specimen Description   Final    BLOOD RIGHT WRIST Performed at [facility], [address] [place], [place] [address]    Special Requests   Final     BOTTLES DRAWN AEROBIC AND ANAEROBIC Blood Culture adequate volume Performed at [facility], [address] [place], [place] [address]    Culture  Setup Time   Final    GRAM POSITIVE COCCI IN CHAINS IN BOTH AEROBIC AND ANAEROBIC BOTTLES CRITICAL RESULT CALLED TO, READ BACK BY AND VERIFIED WITH: PHARMD MICAHEL BITONTI 08/05/21@21 :12 BY TW Performed at [facility], [place] [address], [place], [place] [address]    Culture PENDING  Incomplete   Report Status PENDING  Incomplete  Blood Culture ID Panel (Reflexed)     Status: Abnormal   Collection Time: 08/04/21 10:05 PM  Result Value Ref Range Status   Enterococcus faecalis DETECTED (A) NOT DETECTED Final    Comment: CRITICAL RESULT CALLED TO, READ BACK BY AND VERIFIED WITH: PHARMD MICAHEL BITONTI 08/05/21@21 :12 BY TW    Enterococcus Faecium NOT DETECTED NOT DETECTED Final   Listeria monocytogenes NOT DETECTED NOT DETECTED Final   Staphylococcus species NOT DETECTED NOT DETECTED Final   Staphylococcus aureus (BCID) NOT DETECTED NOT DETECTED Final   Staphylococcus epidermidis NOT DETECTED NOT DETECTED Final   Staphylococcus lugdunensis NOT DETECTED NOT DETECTED Final   Streptococcus species NOT DETECTED NOT DETECTED Final   Streptococcus agalactiae NOT DETECTED NOT DETECTED Final   Streptococcus pneumoniae NOT DETECTED NOT DETECTED Final   Streptococcus pyogenes NOT DETECTED NOT DETECTED Final   A.calcoaceticus-baumannii NOT DETECTED NOT DETECTED Final   Bacteroides fragilis NOT DETECTED NOT DETECTED Final   Enterobacterales NOT DETECTED NOT DETECTED Final   Enterobacter cloacae complex NOT DETECTED NOT DETECTED Final   Escherichia coli NOT DETECTED NOT DETECTED Final   Klebsiella aerogenes NOT DETECTED NOT DETECTED Final   Klebsiella oxytoca NOT DETECTED NOT DETECTED Final   Klebsiella pneumoniae NOT DETECTED NOT DETECTED Final   Proteus species NOT DETECTED NOT DETECTED Final   Salmonella species NOT DETECTED NOT  DETECTED Final   Serratia marcescens NOT DETECTED NOT DETECTED Final   Haemophilus influenzae NOT DETECTED NOT DETECTED Final   Neisseria meningitidis NOT DETECTED NOT DETECTED Final   Pseudomonas aeruginosa NOT DETECTED NOT DETECTED Final   Stenotrophomonas maltophilia NOT DETECTED NOT DETECTED Final   Candida albicans NOT DETECTED NOT DETECTED Final   Candida auris NOT DETECTED NOT DETECTED Final   Candida glabrata NOT DETECTED NOT DETECTED Final   Candida krusei NOT DETECTED NOT DETECTED Final   Candida parapsilosis NOT DETECTED NOT DETECTED Final   Candida tropicalis NOT DETECTED NOT DETECTED Final   Cryptococcus neoformans/gattii NOT DETECTED NOT DETECTED Final   Vancomycin resistance NOT DETECTED NOT DETECTED Final    Comment: Performed at [facility], [address], [place], [place] [address]   Pertinent Imaging CT Angio Chest PE W and/or Wo Contrast  Result Date: 08/04/2021 CLINICAL DATA:  PE suspected, high prob Recently returned from [place], hypoxia, exertional shortness of breath, left leg pain, also rigors and some malaise EXAM: CT ANGIOGRAPHY CHEST WITH CONTRAST TECHNIQUE: Multidetector CT imaging of the chest was performed using the standard protocol during bolus administration of intravenous contrast.  Multiplanar CT image reconstructions and MIPs were obtained to evaluate the vascular anatomy. CONTRAST:  22mL OMNIPAQUE IOHEXOL 350 MG/ML SOLN COMPARISON:  11/29/2016 FINDINGS: Cardiovascular: Heart is enlarged. Prior TAVR. Multi-vessel coronary artery calcifications. Diffuse aortic calcifications. No aneurysm. No filling defects in the pulmonary arteries to suggest pulmonary emboli. Mediastinum/Nodes: No mediastinal, hilar, or axillary adenopathy. Trachea and esophagus are unremarkable. Thyroid unremarkable. Lungs/Pleura: No effusions. Tree-in-bud nodular densities seen in the upper lobes which could reflect small airways disease/alveolitis. No confluent airspace opacities.  Upper Abdomen: Gallstones within the gallbladder. Musculoskeletal: Prior left breast implant. No acute bony abnormality. Review of the MIP images confirms the above findings. IMPRESSION: No evidence of pulmonary embolus. Subtle scattered tree-in-bud nodular densities in the upper lobes bilaterally may reflect small airways disease/alveolitis. No confluent areas of consolidation. Cardiomegaly, coronary artery disease. Cholelithiasis. Aortic Atherosclerosis (ICD10-I70.0). Electronically Signed   By: Rolm Baptise M.D.   On: 08/04/2021 22:49   US Venous Img Lower Unilateral Left  Result Date: 08/04/2021 CLINICAL DATA:  Left leg pain EXAM: LEFT LOWER EXTREMITY VENOUS DOPPLER ULTRASOUND TECHNIQUE: Gray-scale sonography with compression, as well as color and duplex ultrasound, were performed to evaluate the deep venous system(s) from the level of the common femoral vein through the popliteal and proximal calf veins. COMPARISON:  None. FINDINGS: VENOUS Normal compressibility of the common femoral, superficial femoral, and popliteal veins, as well as the visualized calf veins. Visualized portions of profunda femoral vein and great saphenous vein unremarkable. No filling defects to suggest DVT on grayscale or color Doppler imaging. Doppler waveforms show normal direction of venous flow, normal respiratory plasticity and response to augmentation. Limited views of the contralateral common femoral vein are unremarkable. OTHER None. Limitations: none IMPRESSION: Negative. Electronically Signed   By: Rolm Baptise M.D.   On: 08/04/2021 23:36   ECHOCARDIOGRAM COMPLETE  Result Date: 08/05/2021    ECHOCARDIOGRAM REPORT   Patient Name:   LYNNZIE BLACKSON Date of Exam: 08/05/2021 Medical Rec #:  [id number]          Height:       67.0 in Accession #:    [id number]         Weight:       187.6 lb Date of Birth:  Jan 25, 1936          BSA:          1.968 m Patient Age:    13 years           BP:           130/80 mmHg Patient Gender:  F                  HR:           68 bpm. Exam Location:  Inpatient Procedure: 2D Echo, Cardiac Doppler and Color Doppler Indications:    SOB  History:        Patient has prior history of Echocardiogram examinations, most                 recent 02/26/2021. Risk Factors:Family History of Coronary Artery                 Disease and Hypertension.                 Aortic Valve: 26 mm Edwards Sapien prosthetic, stented (TAVR)                 valve is present in the aortic position.  Sonographer:  Helmut Muster Sonographer#2:  Helmut Muster Referring Phys: [id number] ERIC CHEN IMPRESSIONS  1. Left ventricular ejection fraction, by estimation, is 55 to 60%. The left ventricle has normal function. The left ventricle has no regional wall motion abnormalities. There is mild concentric left ventricular hypertrophy. Left ventricular diastolic parameters are indeterminate.  2. Right ventricular systolic function is normal. The right ventricular size is normal.  3. Left atrial size was severely dilated.  4. Right atrial size was severely dilated.  5. The mitral valve is normal in structure. No evidence of mitral valve regurgitation. No evidence of mitral stenosis.  6. Tricuspid valve regurgitation is mild to moderate.  7. The aortic valve has been repaired/replaced. Aortic valve regurgitation is not visualized. No aortic stenosis is present. There is a 26 mm Edwards Sapien prosthetic (TAVR) valve present in the aortic position. Aortic valve mean gradient measures 19.4  mmHg. Aortic valve Vmax measures 2.78 m/s.  8. The inferior vena cava is normal in size with greater than 50% respiratory variability, suggesting right atrial pressure of 3 mmHg. FINDINGS  Left Ventricle: Left ventricular ejection fraction, by estimation, is 55 to 60%. The left ventricle has normal function. The left ventricle has no regional wall motion abnormalities. The left ventricular internal cavity size was normal in size. There is  mild concentric left  ventricular hypertrophy. Left ventricular diastolic parameters are indeterminate. Right Ventricle: The right ventricular size is normal. No increase in right ventricular wall thickness. Right ventricular systolic function is normal. Left Atrium: Left atrial size was severely dilated. Right Atrium: Right atrial size was severely dilated. Pericardium: There is no evidence of pericardial effusion. Mitral Valve: The mitral valve is normal in structure. Mild mitral annular calcification. No evidence of mitral valve regurgitation. No evidence of mitral valve stenosis. Tricuspid Valve: The tricuspid valve is normal in structure. Tricuspid valve regurgitation is mild to moderate. No evidence of tricuspid stenosis. Aortic Valve: The aortic valve has been repaired/replaced. Aortic valve regurgitation is not visualized. No aortic stenosis is present. Aortic valve mean gradient measures 19.4 mmHg. Aortic valve peak gradient measures 30.8 mmHg. Aortic valve area, by VTI measures 0.82 cm. There is a 26 mm Edwards Sapien prosthetic, stented (TAVR) valve present in the aortic position. Pulmonic Valve: The pulmonic valve was normal in structure. Pulmonic valve regurgitation is not visualized. No evidence of pulmonic stenosis. Aorta: The aortic root is normal in size and structure. Venous: The inferior vena cava is normal in size with greater than 50% respiratory variability, suggesting right atrial pressure of 3 mmHg. IAS/Shunts: No atrial level shunt detected by color flow Doppler.  LEFT VENTRICLE PLAX 2D LVIDd:         3.00 cm     Diastology LVIDs:         2.20 cm     LV e' medial:  13.70 cm/s LV PW:         1.40 cm     LV e' lateral: 20.90 cm/s LV IVS:        1.40 cm LVOT diam:     1.80 cm LV SV:         51 LV SV Index:   26 LVOT Area:     2.54 cm  LV Volumes (MOD) LV vol d, MOD A2C: 58.1 ml LV vol d, MOD A4C: 62.9 ml LV vol s, MOD A2C: 22.3 ml LV vol s, MOD A4C: 27.8 ml LV SV MOD A2C:     35.8 ml LV SV MOD A4C:  62.9 ml LV  SV MOD BP:      36.5 ml RIGHT VENTRICLE             IVC RV S prime:     15.30 cm/s  IVC diam: 2.40 cm TAPSE (M-mode): 2.0 cm LEFT ATRIUM              Index        RIGHT ATRIUM           Index LA diam:        5.40 cm  2.74 cm/m   RA Area:     28.10 cm LA Vol (A2C):   169.0 ml 85.87 ml/m  RA Volume:   90.20 ml  45.83 ml/m LA Vol (A4C):   147.0 ml 74.69 ml/m LA Biplane Vol: 160.0 ml 81.30 ml/m  AORTIC VALVE AV Area (Vmax):    0.98 cm AV Area (Vmean):   0.83 cm AV Area (VTI):     0.82 cm AV Vmax:           277.67 cm/s AV Vmean:          224.000 cm/s AV VTI:            0.620 m AV Peak Grad:      30.8 mmHg AV Mean Grad:      19.4 mmHg LVOT Vmax:         107.00 cm/s LVOT Vmean:        72.700 cm/s LVOT VTI:          0.199 m LVOT/AV VTI ratio: 0.32  AORTA Ao Root diam: 3.10 cm Ao Asc diam:  3.40 cm TRICUSPID VALVE TR Peak grad:   50.1 mmHg TR Vmax:        354.00 cm/s  SHUNTS Systemic VTI:  0.20 m Systemic Diam: 1.80 cm Godfrey Pick Tobb DO Electronically signed by Berniece Salines DO Signature Date/Time: 08/05/2021/6:01:53 PM    Final        Delene Ruffini, MD PGY1 [place] [phone] 08/06/2021, 8:30 AM

## 2021-08-06 NOTE — Progress Notes (Signed)
PROGRESS NOTE  [name]  DOB: 03/03/1936  PCP: Maurice Small, MD (Inactive) GMW:[id number]  DOA: 08/04/2021  LOS: 1 day  Hospital Day: 3   Chief Complaint  Patient presents with   Shortness of Breath    Brief narrative: [name] is a 85 y.o. female with PMH significant for HTN, HLD, permanent A. fib on Xarelto, TIA, severe AS  s/p TAVR, chronic diastolic CHF, history of PE, pulmonary hypertension, GERD Patient presented to the ED on 10/18 with sudden onset of shaking chills at home with subjective fever.  Travel history significant for recent return from [place] about 3 weeks ago. In the ED, patient was hypoxic to 89% and required supplemental oxygen. CT angio chest did not show any evidence of pulm embolism but showed subtle scattered tree-in-bud nodular densities in the upper lobes bilaterally. Labs showed first troponin 53 and second at 72, WBC count 17.9, lactic acid 2.6, BNP 474, hemoglobin 9.8. EKG showed A. fib with normal rate, no ischemic changes.    Admitted to hospitalist service. See details as below  Subjective: Patient was seen and examined this afternoon.  Elderly Caucasian female.  Sitting up in chair.  Not in distress.  Not on supplemental oxygen.  Feels better than at presentation but got short of breath when she got back from bathroom 5 minutes prior.. Chart reviewed No fever last 24 hours, heart rate 102 this morning, blood pressure elevated to 161/86, Labs this morning with WBC count slightly better at 11.1, procalcitonin level improving  Assessment/Plan: Atypical pneumonia Sepsis - POA -In the setting of recent travel, patient presented with chills, hypoxia, leukocytosis and lactic acidosis and elevated procalcitonin level.  Chest imaging did not show any consolidation or symptoms highly suggestive of pneumonia. -Currently improving on IV Rocephin -WBC count improving.  Lactic acid level normalized, progressively improving.  Continue same  plan for now. Recent Labs  Lab 08/04/21 2115 08/04/21 2150 08/05/21 0039 08/05/21 0646 08/06/21 0139  WBC 17.9*  --   --  13.3* 11.1*  LATICACIDVEN  --  2.6* 2.6* 1.4  --   PROCALCITON  --   --   --  3.83 3.30   Enterococcus faecalis bacteremia -ID consult appreciated.  Currently on ampicillin -Pending sensitivity -May need further imaging because of a history of TAVR.  It seems cardiology has a plan to do TEE on 10/24.  Acute respiratory failure with hypoxia -On low-flow oxygen.  Wean down as tolerated.  Permanent A. Fib -Continue Cardizem, Xarelto  History of TIA Hyperlipidemia -On Xarelto, Lipitor, Zetia  Aortic stenosis s/p TAVR in 2018  Essential hypertension -blood pressure elevated to 160s this morning.  Continue Cardizem.  Resume losartan and Lasix.    Pulmonary hypertension -Follows up with cardiologist as an outpatient.  There was a plan to obtain PFT and DLCO as an outpatient.     Acute on chronic anemia -Patient has acute drop in hemoglobin from normal baseline to between 9 and 10 in last 3 days.  No acute evidence of bleeding.  Denies any black or tarry stool.   -Check ferritin level tomorrow. Recent Labs    02/02/21 1113 07/27/21 1820 07/27/21 1820 08/04/21 2115 08/05/21 0646 08/06/21 0139  HGB 12.6 12.1  --  9.8* 9.2* 9.5*  MCV 87 97.4   < > 93.1 93.4 94.8  TIBC  --   --   --   --  305  --   IRON  --   --   --   --  16*  --    < > = values in this interval not displayed.   GERD (gastroesophageal reflux disease) -Continue Protonix.  ?Not sure why patient was on chronic prednisone  Mobility: Encourage ambulation Code Status:   Code Status: Full Code  Nutritional status: Body mass index is 29.38 kg/m.     Diet:  Diet Order             Diet Heart Room service appropriate? Yes; Fluid consistency: Thin  Diet effective now                  DVT prophylaxis:  SCDs Start: 08/05/21 0234 rivaroxaban (XARELTO) tablet 20 mg    Antimicrobials: IV ampicillin, IV Rocephin Fluid: None Consultants: Infectious disease Family Communication: None at bedside  Status is: Inpatient Remains inpatient appropriate because: Bacteremia, pending TEE  Dispo: The patient is from: Home              Anticipated d/c is to: Home after TEE              Patient currently is not medically stable to d/c.   Difficult to place patient No     Infusions:   ampicillin (OMNIPEN) IV     cefTRIAXone (ROCEPHIN)  IV 2 g (08/06/21 1300)    Scheduled Meds:  atorvastatin  80 mg Oral Daily   diltiazem  240 mg Oral q morning   pantoprazole  40 mg Oral Daily   predniSONE  5 mg Oral Q breakfast   rivaroxaban  20 mg Oral Q supper    Antimicrobials: Anti-infectives (From admission, onward)    Start     Dose/Rate Route Frequency Ordered Stop   08/06/21 1600  ampicillin (OMNIPEN) 2 g in sodium chloride 0.9 % 100 mL IVPB        2 g 300 mL/hr over 20 Minutes Intravenous Every 4 hours 08/06/21 1415     08/06/21 1000  cefTRIAXone (ROCEPHIN) 2 g in sodium chloride 0.9 % 100 mL IVPB        2 g 200 mL/hr over 30 Minutes Intravenous Every 12 hours 08/06/21 0853     08/05/21 2215  ampicillin (OMNIPEN) 2 g in sodium chloride 0.9 % 100 mL IVPB  Status:  Discontinued        2 g 300 mL/hr over 20 Minutes Intravenous Every 6 hours 08/05/21 2127 08/06/21 1415   08/05/21 2200  azithromycin (ZITHROMAX) tablet 500 mg  Status:  Discontinued        500 mg Oral Daily at bedtime 08/05/21 0233 08/05/21 2127   08/05/21 2200  cefTRIAXone (ROCEPHIN) 2 g in sodium chloride 0.9 % 100 mL IVPB  Status:  Discontinued        2 g 200 mL/hr over 30 Minutes Intravenous Every 24 hours 08/05/21 0233 08/05/21 2127   08/04/21 2345  cefTRIAXone (ROCEPHIN) 1 g in sodium chloride 0.9 % 100 mL IVPB        1 g 200 mL/hr over 30 Minutes Intravenous  Once 08/04/21 2339 08/05/21 0056   08/04/21 2345  azithromycin (ZITHROMAX) 500 mg in sodium chloride 0.9 % 250 mL IVPB        500  mg 250 mL/hr over 60 Minutes Intravenous  Once 08/04/21 2339 08/05/21 0156       PRN meds: acetaminophen **OR** acetaminophen, albuterol, melatonin   Objective: Vitals:   08/06/21 0744 08/06/21 1609  BP: (!) 161/86 130/79  Pulse: (!) 102 84  Resp: 18 17  Temp:  98.2 F (36.8 C) 98.3 F (36.8 C)  SpO2: (!) 85% 93%    Intake/Output Summary (Last 24 hours) at 08/06/2021 1617 Last data filed at 08/06/2021 1430 Gross per 24 hour  Intake 340 ml  Output --  Net 340 ml   Filed Weights   08/04/21 1920 08/05/21 0500  Weight: 84.7 kg 85.1 kg   Weight change:  Body mass index is 29.38 kg/m.   Physical Exam: General exam: Pleasant, elderly Caucasian female.  Mild respiratory distress after exertion Skin: No rashes, lesions or ulcers. HEENT: Atraumatic, normocephalic, no obvious bleeding Lungs: Clear to auscultation bilaterally CVS: Regular rate and rhythm, no murmur GI/Abd soft, nontender, nondistended, bowel sound present CNS: Alert, awake, oriented x3 Psychiatry: Mood appropriate Extremities: No pedal edema, no calf tenderness  Data Review: I have personally reviewed the laboratory data and studies available.  Recent Labs  Lab 08/04/21 2115 08/05/21 0646 08/06/21 0139  WBC 17.9* 13.3* 11.1*  NEUTROABS 16.6* 11.7* 9.8*  HGB 9.8* 9.2* 9.5*  HCT 29.7* 28.1* 29.3*  MCV 93.1 93.4 94.8  PLT 155 139* 149*   Recent Labs  Lab 08/04/21 2115 08/05/21 0646 08/06/21 0139  NA 136 135 138  K 3.4* 3.5 3.6  CL 106 105 107  CO2 18* 22 23  GLUCOSE 133* 137* 122*  BUN 36* 28* 23  CREATININE 1.21* 1.00 0.88  CALCIUM 9.0 8.7* 8.9  MG  --  2.0 2.0    F/u labs ordered Unresulted Labs (From admission, onward)     Start     Ordered   08/07/21 0500  Ferritin  Tomorrow morning,   R        08/06/21 1003   08/06/21 0821  Culture, blood (routine x 2)  BLOOD CULTURE X 2,   R      08/06/21 0820   08/06/21 0500  Procalcitonin  Daily,   R      08/05/21 0233   08/06/21 5621   Basic metabolic panel  Daily,   R      08/05/21 1504   08/06/21 0500  Magnesium  Daily,   R      08/05/21 1504   08/06/21 0500  CBC with Differential/Platelet  Daily,   R      08/05/21 1504   08/05/21 1617  Miscellaneous [facility] test (send-out)  Once,   R        08/05/21 1617   08/05/21 0500  Occult blood card to lab, stool  Daily,   R      08/05/21 0233   08/05/21 0234  Legionella Pneumophila Serogp 1 Ur Ag  (COPD / Pneumonia / Cellulitis / Lower Extremity Wound)  Once,   R        08/05/21 0233            Signed, Terrilee Croak, MD [facility] 08/06/2021

## 2021-08-07 DIAGNOSIS — J9601 Acute respiratory failure with hypoxia: Secondary | ICD-10-CM | POA: Diagnosis not present

## 2021-08-07 LAB — CBC WITH DIFFERENTIAL/PLATELET
Abs Immature Granulocytes: 0.04 10*3/uL (ref 0.00–0.07)
Basophils Absolute: 0 10*3/uL (ref 0.0–0.1)
Basophils Relative: 0 %
Eosinophils Absolute: 0.1 10*3/uL (ref 0.0–0.5)
Eosinophils Relative: 1 %
HCT: 29.2 % — ABNORMAL LOW (ref 36.0–46.0)
Hemoglobin: 9.5 g/dL — ABNORMAL LOW (ref 12.0–15.0)
Immature Granulocytes: 0 %
Lymphocytes Relative: 13 %
Lymphs Abs: 1.4 10*3/uL (ref 0.7–4.0)
MCH: 30.5 pg (ref 26.0–34.0)
MCHC: 32.5 g/dL (ref 30.0–36.0)
MCV: 93.9 fL (ref 80.0–100.0)
Monocytes Absolute: 0.5 10*3/uL (ref 0.1–1.0)
Monocytes Relative: 5 %
Neutro Abs: 8.6 10*3/uL — ABNORMAL HIGH (ref 1.7–7.7)
Neutrophils Relative %: 81 %
Platelets: 183 10*3/uL (ref 150–400)
RBC: 3.11 MIL/uL — ABNORMAL LOW (ref 3.87–5.11)
RDW: 14.7 % (ref 11.5–15.5)
WBC: 10.7 10*3/uL — ABNORMAL HIGH (ref 4.0–10.5)
nRBC: 0 % (ref 0.0–0.2)

## 2021-08-07 LAB — MISC LABCORP TEST (SEND OUT): Labcorp test code: 139650

## 2021-08-07 LAB — BASIC METABOLIC PANEL
Anion gap: 10 (ref 5–15)
BUN: 21 mg/dL (ref 8–23)
CO2: 20 mmol/L — ABNORMAL LOW (ref 22–32)
Calcium: 9 mg/dL (ref 8.9–10.3)
Chloride: 107 mmol/L (ref 98–111)
Creatinine, Ser: 1.05 mg/dL — ABNORMAL HIGH (ref 0.44–1.00)
GFR, Estimated: 53 mL/min — ABNORMAL LOW (ref >60)
Glucose, Bld: 130 mg/dL — ABNORMAL HIGH (ref 70–99)
Potassium: 3 mmol/L — ABNORMAL LOW (ref 3.5–5.1)
Sodium: 137 mmol/L (ref 135–145)

## 2021-08-07 LAB — CULTURE, BLOOD (ROUTINE X 2)
Special Requests: ADEQUATE
Special Requests: ADEQUATE

## 2021-08-07 LAB — MAGNESIUM: Magnesium: 1.9 mg/dL (ref 1.7–2.4)

## 2021-08-07 LAB — FERRITIN: Ferritin: 81 ng/mL (ref 11–307)

## 2021-08-07 LAB — PROCALCITONIN: Procalcitonin: 2.51 ng/mL

## 2021-08-07 MED ORDER — SODIUM CHLORIDE 0.9 % IV SOLN: INTRAVENOUS | Status: DC

## 2021-08-07 MED ORDER — FUROSEMIDE 20 MG PO TABS: 20.0000 mg | ORAL_TABLET | Freq: Every day | ORAL | Status: DC

## 2021-08-07 MED ORDER — POTASSIUM CHLORIDE CRYS ER 20 MEQ PO TBCR
40.0000 meq | EXTENDED_RELEASE_TABLET | Freq: Once | ORAL | Status: AC
  Administered 2021-08-07: 40 meq via ORAL
  Filled 2021-08-07: qty 2

## 2021-08-07 MED ORDER — SODIUM CHLORIDE 0.9 % IV SOLN
2.0000 g | Freq: Four times a day (QID) | INTRAVENOUS | Status: DC
  Administered 2021-08-07 – 2021-08-12 (×19): 2 g via INTRAVENOUS
  Filled 2021-08-07 (×24): qty 2000

## 2021-08-07 MED ORDER — LOSARTAN POTASSIUM 50 MG PO TABS
100.0000 mg | ORAL_TABLET | Freq: Every day | ORAL | Status: DC
  Administered 2021-08-07 – 2021-08-12 (×6): 100 mg via ORAL
  Filled 2021-08-07 (×6): qty 2

## 2021-08-07 MED ORDER — FUROSEMIDE 20 MG PO TABS
20.0000 mg | ORAL_TABLET | Freq: Every day | ORAL | Status: DC
  Administered 2021-08-08 – 2021-08-12 (×5): 20 mg via ORAL
  Filled 2021-08-07 (×5): qty 1

## 2021-08-07 NOTE — Care Management Important Message (Signed)
Important Message  Patient Details  Name: [name] MRN: [id number] Date of Birth: 27-Mar-1936   Medicare Important Message Given:  Yes     Lindley Stachnik Montine Circle 08/07/2021, 3:56 PM

## 2021-08-07 NOTE — Progress Notes (Signed)
   [name] has been requested to perform a transesophageal echocardiogram on [name] for bacteremia.  After careful review of history and examination, the risks and benefits of transesophageal echocardiogram have been explained including risks of esophageal damage, perforation (1:10,000 risk), bleeding, pharyngeal hematoma as well as other potential complications associated with conscious sedation including aspiration, arrhythmia, respiratory failure and death. Alternatives to treatment were discussed, questions were answered. Patient is willing to proceed.   Procedure scheduled for 08/10/2021 at 8:30am with Dr. Harrell Gave. Will place orders.  Darreld Mclean, PA-C 08/07/2021 5:01 PM

## 2021-08-07 NOTE — Progress Notes (Signed)
PROGRESS NOTE  [name]  DOB: [birth year]  PCP: Maurice Small, MD (Inactive) [id number]  DOA: 08/04/2021  LOS: 2 days  Hospital Day: 4   Chief Complaint  Patient presents with   Shortness of Breath    Brief narrative: [name] is a 85 y.o. female with PMH significant for HTN, HLD, permanent A. fib on Xarelto, TIA, severe AS  s/p TAVR, chronic diastolic CHF, history of PE, pulmonary hypertension, GERD Patient presented to the ED on 10/18 with sudden onset of shaking chills at home with subjective fever.  Travel history significant for recent return from [place] about 3 weeks ago. In the ED, patient was hypoxic to 89% and required supplemental oxygen. CT angio chest did not show any evidence of pulm embolism but showed subtle scattered tree-in-bud nodular densities in the upper lobes bilaterally. Labs showed first troponin 53 and second at 72, WBC count 17.9, lactic acid 2.6, BNP 474, hemoglobin 9.8. EKG showed A. fib with normal rate, no ischemic changes.    Admitted to hospitalist service. See details as below  Subjective: Patient was seen and examined this morning.  Not in distress.  Sitting up in chair.  Not on supplemental oxygen. No fever.  WBC count improving.  Assessment/Plan: Atypical pneumonia Sepsis - POA -In the setting of recent travel, patient presented with chills, hypoxia, leukocytosis and lactic acidosis and elevated procalcitonin level.  Chest imaging did not show any consolidation or symptoms highly suggestive of pneumonia. -Currently improving on IV Rocephin -WBC count improving.  Lactic acid level normalized, progressively improving.   Recent Labs  Lab 08/04/21 2115 08/04/21 2150 08/05/21 0039 08/05/21 0646 08/06/21 0139 08/07/21 0133  WBC 17.9*  --   --  13.3* 11.1* 10.7*  LATICACIDVEN  --  2.6* 2.6* 1.4  --   --   PROCALCITON  --   --   --  3.83 3.30 2.51   Enterococcus faecalis bacteremia -ID consult appreciated.  Currently  on ampicillin -Pending sensitivity -Pending TEE on 10/24.  Acute respiratory failure with hypoxia -On low-flow oxygen.  Wean down as tolerated.  Permanent A. Fib -Continue Cardizem, Xarelto  History of TIA Hyperlipidemia -On Xarelto, Lipitor, Zetia  Aortic stenosis s/p TAVR in 2018  Essential hypertension -blood pressure elevated to 160s this morning.  Continue Cardizem.  Resume losartan and Lasix.    Pulmonary hypertension -Follows up with cardiologist as an outpatient.  There was a plan to obtain PFT and DLCO as an outpatient.     Acute on chronic anemia -Patient has acute drop in hemoglobin from normal baseline to between 9 and 10 in last 3 days.  No acute evidence of bleeding.  Denies any black or tarry stool.  Ferritin level at 81. Recent Labs    07/27/21 1820 08/04/21 2115 08/05/21 0646 08/06/21 0139 08/07/21 0133  HGB 12.1 9.8* 9.2* 9.5* 9.5*  MCV 97.4 93.1 93.4 94.8 93.9  FERRITIN  --   --   --   --  81  TIBC  --   --  305  --   --   IRON  --   --  16*  --   --    GERD (gastroesophageal reflux disease) -Continue Protonix.  ?Not sure why patient was on chronic prednisone.  Currently controlled on the same.  Mobility: Encourage ambulation Code Status:   Code Status: Full Code  Nutritional status: Body mass index is 29.38 kg/m.     Diet:  Diet Order  Diet Heart Room service appropriate? Yes; Fluid consistency: Thin  Diet effective now                  DVT prophylaxis:  SCDs Start: 08/05/21 0234 rivaroxaban (XARELTO) tablet 20 mg   Antimicrobials: IV ampicillin, IV Rocephin Fluid: None Consultants: Infectious disease Family Communication: None at bedside  Status is: Inpatient Remains inpatient appropriate because: Bacteremia, pending TEE on 10/24  Dispo: The patient is from: Home              Anticipated d/c is to: Home after TEE              Patient currently is not medically stable to d/c.   Difficult to place patient  No     Infusions:   ampicillin (OMNIPEN) IV 2 g (08/07/21 0751)   cefTRIAXone (ROCEPHIN)  IV 2 g (08/07/21 1039)    Scheduled Meds:  atorvastatin  80 mg Oral Daily   diltiazem  240 mg Oral q morning   furosemide  20 mg Oral Daily   losartan  100 mg Oral Daily   pantoprazole  40 mg Oral Daily   predniSONE  5 mg Oral Q breakfast   rivaroxaban  20 mg Oral Q supper    Antimicrobials: Anti-infectives (From admission, onward)    Start     Dose/Rate Route Frequency Ordered Stop   08/06/21 1600  ampicillin (OMNIPEN) 2 g in sodium chloride 0.9 % 100 mL IVPB        2 g 300 mL/hr over 20 Minutes Intravenous Every 4 hours 08/06/21 1415     08/06/21 1000  cefTRIAXone (ROCEPHIN) 2 g in sodium chloride 0.9 % 100 mL IVPB        2 g 200 mL/hr over 30 Minutes Intravenous Every 12 hours 08/06/21 0853     08/05/21 2215  ampicillin (OMNIPEN) 2 g in sodium chloride 0.9 % 100 mL IVPB  Status:  Discontinued        2 g 300 mL/hr over 20 Minutes Intravenous Every 6 hours 08/05/21 2127 08/06/21 1415   08/05/21 2200  azithromycin (ZITHROMAX) tablet 500 mg  Status:  Discontinued        500 mg Oral Daily at bedtime 08/05/21 0233 08/05/21 2127   08/05/21 2200  cefTRIAXone (ROCEPHIN) 2 g in sodium chloride 0.9 % 100 mL IVPB  Status:  Discontinued        2 g 200 mL/hr over 30 Minutes Intravenous Every 24 hours 08/05/21 0233 08/05/21 2127   08/04/21 2345  cefTRIAXone (ROCEPHIN) 1 g in sodium chloride 0.9 % 100 mL IVPB        1 g 200 mL/hr over 30 Minutes Intravenous  Once 08/04/21 2339 08/05/21 0056   08/04/21 2345  azithromycin (ZITHROMAX) 500 mg in sodium chloride 0.9 % 250 mL IVPB        500 mg 250 mL/hr over 60 Minutes Intravenous  Once 08/04/21 2339 08/05/21 0156       PRN meds: acetaminophen **OR** acetaminophen, albuterol, melatonin   Objective: Vitals:   08/07/21 0400 08/07/21 0733  BP: (!) 143/84 (!) 162/83  Pulse: 73 82  Resp: 17 16  Temp: 97.6 F (36.4 C) (!) 97.4 F (36.3 C)   SpO2: 92% 94%    Intake/Output Summary (Last 24 hours) at 08/07/2021 1330 Last data filed at 08/07/2021 0451 Gross per 24 hour  Intake 740 ml  Output --  Net 740 ml   Filed Weights   08/04/21 1920  08/05/21 0500  Weight: 84.7 kg 85.1 kg   Weight change:  Body mass index is 29.38 kg/m.   Physical Exam: General exam: Pleasant, elderly Caucasian female.  Mild respiratory distress after exertion Skin: No rashes, lesions or ulcers. HEENT: Atraumatic, normocephalic, no obvious bleeding Lungs: Clear to auscultation bilaterally CVS: Regular rate and rhythm, no murmur GI/Abd soft, nontender, nondistended, bowel sound present CNS: Alert, awake, oriented x3 Psychiatry: Mood appropriate Extremities: No pedal edema, no calf tenderness  Data Review: I have personally reviewed the laboratory data and studies available.  Recent Labs  Lab 08/04/21 2115 08/05/21 0646 08/06/21 0139 08/07/21 0133  WBC 17.9* 13.3* 11.1* 10.7*  NEUTROABS 16.6* 11.7* 9.8* 8.6*  HGB 9.8* 9.2* 9.5* 9.5*  HCT 29.7* 28.1* 29.3* 29.2*  MCV 93.1 93.4 94.8 93.9  PLT 155 139* 149* 183   Recent Labs  Lab 08/04/21 2115 08/05/21 0646 08/06/21 0139 08/07/21 0133  NA 136 135 138 137  K 3.4* 3.5 3.6 3.0*  CL 106 105 107 107  CO2 18* 22 23 20*  GLUCOSE 133* 137* 122* 130*  BUN 36* 28* 23 21  CREATININE 1.21* 1.00 0.88 1.05*  CALCIUM 9.0 8.7* 8.9 9.0  MG  --  2.0 2.0 1.9    F/u labs ordered Unresulted Labs (From admission, onward)     Start     Ordered   08/06/21 5784  Basic metabolic panel  Daily,   R      08/05/21 1504   08/06/21 0500  Magnesium  Daily,   R      08/05/21 1504   08/06/21 0500  CBC with Differential/Platelet  Daily,   R      08/05/21 1504   08/05/21 1617  Miscellaneous [facility] test (send-out)  Once,   R        08/05/21 1617   08/05/21 0500  Occult blood card to lab, stool  Daily,   R      08/05/21 0233            Signed, Terrilee Croak, MD [facility] 08/07/2021

## 2021-08-08 DIAGNOSIS — J9601 Acute respiratory failure with hypoxia: Secondary | ICD-10-CM | POA: Diagnosis not present

## 2021-08-08 LAB — BASIC METABOLIC PANEL
Anion gap: 9 (ref 5–15)
BUN: 19 mg/dL (ref 8–23)
CO2: 20 mmol/L — ABNORMAL LOW (ref 22–32)
Calcium: 8.6 mg/dL — ABNORMAL LOW (ref 8.9–10.3)
Chloride: 111 mmol/L (ref 98–111)
Creatinine, Ser: 1 mg/dL (ref 0.44–1.00)
GFR, Estimated: 56 mL/min — ABNORMAL LOW (ref >60)
Glucose, Bld: 93 mg/dL (ref 70–99)
Potassium: 3.5 mmol/L (ref 3.5–5.1)
Sodium: 140 mmol/L (ref 135–145)

## 2021-08-08 LAB — CBC WITH DIFFERENTIAL/PLATELET
Abs Immature Granulocytes: 0.03 10*3/uL (ref 0.00–0.07)
Basophils Absolute: 0 10*3/uL (ref 0.0–0.1)
Basophils Relative: 0 %
Eosinophils Absolute: 0.2 10*3/uL (ref 0.0–0.5)
Eosinophils Relative: 2 %
HCT: 26.4 % — ABNORMAL LOW (ref 36.0–46.0)
Hemoglobin: 8.6 g/dL — ABNORMAL LOW (ref 12.0–15.0)
Immature Granulocytes: 0 %
Lymphocytes Relative: 16 %
Lymphs Abs: 1.4 10*3/uL (ref 0.7–4.0)
MCH: 30.5 pg (ref 26.0–34.0)
MCHC: 32.6 g/dL (ref 30.0–36.0)
MCV: 93.6 fL (ref 80.0–100.0)
Monocytes Absolute: 0.7 10*3/uL (ref 0.1–1.0)
Monocytes Relative: 8 %
Neutro Abs: 6.9 10*3/uL (ref 1.7–7.7)
Neutrophils Relative %: 74 %
Platelets: 175 10*3/uL (ref 150–400)
RBC: 2.82 MIL/uL — ABNORMAL LOW (ref 3.87–5.11)
RDW: 14.8 % (ref 11.5–15.5)
WBC: 9.3 10*3/uL (ref 4.0–10.5)
nRBC: 0 % (ref 0.0–0.2)

## 2021-08-08 LAB — MAGNESIUM: Magnesium: 1.8 mg/dL (ref 1.7–2.4)

## 2021-08-08 NOTE — Progress Notes (Signed)
PROGRESS NOTE  [name]  DOB: 1936-07-02  PCP: Maurice Small, MD (Inactive) GYK:[id number]  DOA: 08/04/2021  LOS: 3 days  Hospital Day: 5   Chief Complaint  Patient presents with   Shortness of Breath    Brief narrative: [name] is a 85 y.o. female with PMH significant for HTN, HLD, permanent A. fib on Xarelto, TIA, severe AS  s/p TAVR, chronic diastolic CHF, history of PE, pulmonary hypertension, GERD Patient presented to the ED on 10/18 with sudden onset of shaking chills at home with subjective fever.  Travel history significant for recent return from [place] about 3 weeks ago. In the ED, patient was hypoxic to 89% and required supplemental oxygen. CT angio chest did not show any evidence of pulm embolism but showed subtle scattered tree-in-bud nodular densities in the upper lobes bilaterally. Labs showed first troponin 53 and second at 72, WBC count 17.9, lactic acid 2.6, BNP 474, hemoglobin 9.8. EKG showed A. fib with normal rate, no ischemic changes.    Admitted to hospitalist service. See details as below  Subjective: Patient was seen and examined this morning.  Sitting up in chair.  Not in distress.  No new symptoms. Waiting for TEE on Monday.  Assessment/Plan: Atypical pneumonia Sepsis - POA -In the setting of recent travel, patient presented with chills, hypoxia, leukocytosis and lactic acidosis and elevated procalcitonin level.  Chest imaging did not show any consolidation or symptoms highly suggestive of pneumonia. -Currently improving on IV Rocephin -WBC count improving.  Lactic acid level normalized, progressively improving.   Recent Labs  Lab 08/04/21 2115 08/04/21 2150 08/05/21 0039 08/05/21 0646 08/06/21 0139 08/07/21 0133 08/08/21 0115  WBC 17.9*  --   --  13.3* 11.1* 10.7* 9.3  LATICACIDVEN  --  2.6* 2.6* 1.4  --   --   --   PROCALCITON  --   --   --  3.83 3.30 2.51  --     Enterococcus faecalis bacteremia -ID consult  appreciated.  Currently on ampicillin -Pending sensitivity -Pending TEE on 10/24.  Acute respiratory failure with hypoxia -On low-flow oxygen.  Wean down as tolerated.  Permanent A. Fib -Continue Cardizem, Xarelto  History of TIA Hyperlipidemia -On Xarelto, Lipitor, Zetia  Aortic stenosis s/p TAVR in 2018  Essential hypertension -blood pressure elevated to 160s this morning.   -Currently on Cardizem, losartan and Lasix.  Patient reports that recently lisinopril was stopped by her PCP for cough.    Pulmonary hypertension -Follows up with cardiologist as an outpatient.  There was a plan to obtain PFT and DLCO as an outpatient.     Acute on chronic anemia -Patient has acute drop in hemoglobin from normal baseline to between 9 and 10 in last 3 days.  Further dropped to 8.6 today.  Unclear cause.  Obtain FOBT.   Recent Labs    08/04/21 2115 08/05/21 0646 08/06/21 0139 08/07/21 0133 08/08/21 0115  HGB 9.8* 9.2* 9.5* 9.5* 8.6*  MCV 93.1 93.4 94.8 93.9 93.6  FERRITIN  --   --   --  81  --   TIBC  --  305  --   --   --   IRON  --  16*  --   --   --     GERD (gastroesophageal reflux disease) -Continue Protonix.  ?Not sure why patient was on chronic prednisone.  Currently controlled on the same.  Mobility: Encourage ambulation Code Status:   Code Status: Full Code  Nutritional status:  Body mass index is 29.04 kg/m.     Diet:  Diet Order             Diet NPO time specified  Diet effective midnight           Diet NPO time specified  Diet effective midnight           Diet Heart Room service appropriate? Yes; Fluid consistency: Thin  Diet effective now                  DVT prophylaxis:  SCDs Start: 08/05/21 0234 rivaroxaban (XARELTO) tablet 20 mg   Antimicrobials: IV ampicillin, IV Rocephin Fluid: None Consultants: Infectious disease Family Communication: None at bedside  Status is: Inpatient Remains inpatient appropriate because: Bacteremia, pending TEE  on 10/24  Dispo: The patient is from: Home              Anticipated d/c is to: Home after TEE              Patient currently is not medically stable to d/c.   Difficult to place patient No     Infusions:   sodium chloride 20 mL/hr at 08/07/21 2106   ampicillin (OMNIPEN) IV 2 g (08/08/21 0535)   cefTRIAXone (ROCEPHIN)  IV 2 g (08/08/21 0925)    Scheduled Meds:  atorvastatin  80 mg Oral Daily   diltiazem  240 mg Oral q morning   furosemide  20 mg Oral Daily   losartan  100 mg Oral Daily   pantoprazole  40 mg Oral Daily   predniSONE  5 mg Oral Q breakfast   rivaroxaban  20 mg Oral Q supper    Antimicrobials: Anti-infectives (From admission, onward)    Start     Dose/Rate Route Frequency Ordered Stop   08/07/21 1800  ampicillin (OMNIPEN) 2 g in sodium chloride 0.9 % 100 mL IVPB        2 g 300 mL/hr over 20 Minutes Intravenous Every 6 hours 08/07/21 1430     08/06/21 1600  ampicillin (OMNIPEN) 2 g in sodium chloride 0.9 % 100 mL IVPB  Status:  Discontinued        2 g 300 mL/hr over 20 Minutes Intravenous Every 4 hours 08/06/21 1415 08/07/21 1430   08/06/21 1000  cefTRIAXone (ROCEPHIN) 2 g in sodium chloride 0.9 % 100 mL IVPB        2 g 200 mL/hr over 30 Minutes Intravenous Every 12 hours 08/06/21 0853     08/05/21 2215  ampicillin (OMNIPEN) 2 g in sodium chloride 0.9 % 100 mL IVPB  Status:  Discontinued        2 g 300 mL/hr over 20 Minutes Intravenous Every 6 hours 08/05/21 2127 08/06/21 1415   08/05/21 2200  azithromycin (ZITHROMAX) tablet 500 mg  Status:  Discontinued        500 mg Oral Daily at bedtime 08/05/21 0233 08/05/21 2127   08/05/21 2200  cefTRIAXone (ROCEPHIN) 2 g in sodium chloride 0.9 % 100 mL IVPB  Status:  Discontinued        2 g 200 mL/hr over 30 Minutes Intravenous Every 24 hours 08/05/21 0233 08/05/21 2127   08/04/21 2345  cefTRIAXone (ROCEPHIN) 1 g in sodium chloride 0.9 % 100 mL IVPB        1 g 200 mL/hr over 30 Minutes Intravenous  Once 08/04/21 2339  08/05/21 0056   08/04/21 2345  azithromycin (ZITHROMAX) 500 mg in sodium chloride 0.9 % 250 mL  IVPB        500 mg 250 mL/hr over 60 Minutes Intravenous  Once 08/04/21 2339 08/05/21 0156       PRN meds: acetaminophen **OR** acetaminophen, albuterol, melatonin   Objective: Vitals:   08/08/21 0453 08/08/21 0737  BP: (!) 159/99 (!) 156/84  Pulse: 91 76  Resp: 17   Temp: 97.9 F (36.6 C) 98.2 F (36.8 C)  SpO2: 93% 94%    Intake/Output Summary (Last 24 hours) at 08/08/2021 1154 Last data filed at 08/08/2021 0550 Gross per 24 hour  Intake 1380 ml  Output 1 ml  Net 1379 ml    Filed Weights   08/04/21 1920 08/05/21 0500 08/08/21 0535  Weight: 84.7 kg 85.1 kg 84.1 kg   Weight change:  Body mass index is 29.04 kg/m.   Physical Exam: General exam: Pleasant, elderly Caucasian female.  Not in respiratory distress this morning.   Skin: No rashes, lesions or ulcers. HEENT: Atraumatic, normocephalic, no obvious bleeding Lungs: Clear to auscultation bilaterally CVS: Regular rate and rhythm, no murmur GI/Abd soft, nontender, nondistended, bowel sound present CNS: Alert, awake, oriented x3 Psychiatry: Mood appropriate Extremities: No pedal edema, no calf tenderness  Data Review: I have personally reviewed the laboratory data and studies available.  Recent Labs  Lab 08/04/21 2115 08/05/21 0646 08/06/21 0139 08/07/21 0133 08/08/21 0115  WBC 17.9* 13.3* 11.1* 10.7* 9.3  NEUTROABS 16.6* 11.7* 9.8* 8.6* 6.9  HGB 9.8* 9.2* 9.5* 9.5* 8.6*  HCT 29.7* 28.1* 29.3* 29.2* 26.4*  MCV 93.1 93.4 94.8 93.9 93.6  PLT 155 139* 149* 183 175    Recent Labs  Lab 08/04/21 2115 08/05/21 0646 08/06/21 0139 08/07/21 0133 08/08/21 0115  NA 136 135 138 137 140  K 3.4* 3.5 3.6 3.0* 3.5  CL 106 105 107 107 111  CO2 18* 22 23 20* 20*  GLUCOSE 133* 137* 122* 130* 93  BUN 36* 28* 23 21 19   CREATININE 1.21* 1.00 0.88 1.05* 1.00  CALCIUM 9.0 8.7* 8.9 9.0 8.6*  MG  --  2.0 2.0 1.9 1.8      F/u labs ordered Unresulted Labs (From admission, onward)     Start     Ordered   08/06/21 2563  Basic metabolic panel  Daily,   R      08/05/21 1504   08/06/21 0500  Magnesium  Daily,   R      08/05/21 1504   08/06/21 0500  CBC with Differential/Platelet  Daily,   R      08/05/21 1504   08/05/21 0500  Occult blood card to lab, stool  Daily,   R      08/05/21 0233            Signed, Terrilee Croak, MD [facility] 08/08/2021

## 2021-08-09 DIAGNOSIS — J9601 Acute respiratory failure with hypoxia: Secondary | ICD-10-CM | POA: Diagnosis not present

## 2021-08-09 LAB — MAGNESIUM: Magnesium: 1.9 mg/dL (ref 1.7–2.4)

## 2021-08-09 LAB — CBC WITH DIFFERENTIAL/PLATELET
Abs Immature Granulocytes: 0.06 10*3/uL (ref 0.00–0.07)
Basophils Absolute: 0.1 10*3/uL (ref 0.0–0.1)
Basophils Relative: 1 %
Eosinophils Absolute: 0.2 10*3/uL (ref 0.0–0.5)
Eosinophils Relative: 2 %
HCT: 32.5 % — ABNORMAL LOW (ref 36.0–46.0)
Hemoglobin: 10.3 g/dL — ABNORMAL LOW (ref 12.0–15.0)
Immature Granulocytes: 1 %
Lymphocytes Relative: 16 %
Lymphs Abs: 1.9 10*3/uL (ref 0.7–4.0)
MCH: 30.2 pg (ref 26.0–34.0)
MCHC: 31.7 g/dL (ref 30.0–36.0)
MCV: 95.3 fL (ref 80.0–100.0)
Monocytes Absolute: 0.7 10*3/uL (ref 0.1–1.0)
Monocytes Relative: 6 %
Neutro Abs: 8.9 10*3/uL — ABNORMAL HIGH (ref 1.7–7.7)
Neutrophils Relative %: 74 %
Platelets: 207 10*3/uL (ref 150–400)
RBC: 3.41 MIL/uL — ABNORMAL LOW (ref 3.87–5.11)
RDW: 14.9 % (ref 11.5–15.5)
WBC: 11.8 10*3/uL — ABNORMAL HIGH (ref 4.0–10.5)
nRBC: 0 % (ref 0.0–0.2)

## 2021-08-09 LAB — BASIC METABOLIC PANEL
Anion gap: 11 (ref 5–15)
BUN: 15 mg/dL (ref 8–23)
CO2: 21 mmol/L — ABNORMAL LOW (ref 22–32)
Calcium: 8.8 mg/dL — ABNORMAL LOW (ref 8.9–10.3)
Chloride: 107 mmol/L (ref 98–111)
Creatinine, Ser: 0.99 mg/dL (ref 0.44–1.00)
GFR, Estimated: 57 mL/min — ABNORMAL LOW (ref >60)
Glucose, Bld: 87 mg/dL (ref 70–99)
Potassium: 3.4 mmol/L — ABNORMAL LOW (ref 3.5–5.1)
Sodium: 139 mmol/L (ref 135–145)

## 2021-08-09 LAB — SURGICAL PCR SCREEN
MRSA, PCR: NEGATIVE
Staphylococcus aureus: NEGATIVE

## 2021-08-09 NOTE — Plan of Care (Signed)

## 2021-08-09 NOTE — Progress Notes (Addendum)
PROGRESS NOTE  [name]  DOB: [birth year]  PCP: Maurice Small, MD (Inactive) BSJ:[id number]  DOA: 08/04/2021  LOS: 4 days  Hospital Day: 6   Chief Complaint  Patient presents with   Shortness of Breath    Brief narrative: [name] is a 85 y.o. female with PMH significant for HTN, HLD, permanent A. fib on Xarelto, TIA, severe AS  s/p TAVR, chronic diastolic CHF, history of PE, pulmonary hypertension, GERD Patient presented to the ED on 10/18 with sudden onset of shaking chills at home with subjective fever.  Travel history significant for recent return from [place] about 3 weeks ago. In the ED, patient was hypoxic to 89% and required supplemental oxygen. CT angio chest did not show any evidence of pulm embolism but showed subtle scattered tree-in-bud nodular densities in the upper lobes bilaterally. Labs showed first troponin 53 and second at 72, WBC count 17.9, lactic acid 2.6, BNP 474, hemoglobin 9.8. EKG showed A. fib with normal rate, no ischemic changes.    Admitted to hospitalist service. See details as below  Subjective: Patient was seen and examined this morning.  Sitting up in chair.  Not in distress.  No eye symptoms.  Waiting for TEE tomorrow. I asked the patient why she is on prednisone at home patient does not remember why she is on prednisone at 5 mg daily.  I asked if she has rheumatoid arthritis, she is not aware but both her hands have significant small joint deformities.  Assessment/Plan: Atypical pneumonia Sepsis - POA -In the setting of recent travel, patient presented with chills, hypoxia, leukocytosis and lactic acidosis and elevated procalcitonin level.  Chest imaging did not show any consolidation or symptoms highly suggestive of pneumonia. -Currently improving on IV Rocephin -No cough, fever. -WBC count improving.  Lactic acid level normalized, progressively improving.   Recent Labs  Lab 08/04/21 2150 08/05/21 0039 08/05/21 0646  08/06/21 0139 08/07/21 0133 08/08/21 0115 08/09/21 0204  WBC  --   --  13.3* 11.1* 10.7* 9.3 11.8*  LATICACIDVEN 2.6* 2.6* 1.4  --   --   --   --   PROCALCITON  --   --  3.83 3.30 2.51  --   --     Enterococcus faecalis bacteremia -ID consult appreciated.  Currently on ampicillin -Pending sensitivity -Pending TEE on 10/24.  Acute respiratory failure with hypoxia -Initially required supplemental oxygen.  Currently breathing on room air.  Encourage ambulation.Marland Kitchen  Permanent A. Fib -Continue Cardizem, Xarelto  History of TIA Hyperlipidemia -On Xarelto, Lipitor, Zetia  Aortic stenosis s/p TAVR in 2018  Essential hypertension -blood pressure elevated to 160s this morning.   -Currently on Cardizem, losartan and Lasix.  Patient reports that recently lisinopril was stopped by her PCP for cough.    Pulmonary hypertension -Follows up with cardiologist as an outpatient.  There was a plan to obtain PFT and DLCO as an outpatient.  Continue to follow-up as an outpatient.   Acute on chronic anemia -Patient has acute drop in hemoglobin from normal baseline to between 9 and 10 in last 3 days.  Hemoglobin much better at 10.3 today.   Recent Labs    08/05/21 0646 08/06/21 0139 08/07/21 0133 08/08/21 0115 08/09/21 0204  HGB 9.2* 9.5* 9.5* 8.6* 10.3*  MCV 93.4 94.8 93.9 93.6 95.3  FERRITIN  --   --  81  --   --   TIBC 305  --   --   --   --  IRON 16*  --   --   --   --     GERD (gastroesophageal reflux disease) -Continue Protonix.  ?? History of rheumatoid arthritis -On prednisone 5 mg daily.  Mobility: Encourage ambulation Code Status:   Code Status: Full Code  Nutritional status: Body mass index is 29.04 kg/m.     Diet:  Diet Order             Diet NPO time specified  Diet effective midnight           Diet NPO time specified  Diet effective midnight           Diet Heart Room service appropriate? Yes; Fluid consistency: Thin  Diet effective now                   DVT prophylaxis:  SCDs Start: 08/05/21 0234 rivaroxaban (XARELTO) tablet 20 mg   Antimicrobials: IV ampicillin, IV Rocephin Fluid: None Consultants: Infectious disease Family Communication: None at bedside  Status is: Inpatient Remains inpatient appropriate because: Bacteremia, pending TEE on 10/24  Dispo: The patient is from: Home              Anticipated d/c is to: Home after TEE              Patient currently is not medically stable to d/c.   Difficult to place patient No     Infusions:   sodium chloride 20 mL/hr at 08/07/21 2106   ampicillin (OMNIPEN) IV Stopped (08/09/21 6606)   cefTRIAXone (ROCEPHIN)  IV 2 g (08/09/21 0825)    Scheduled Meds:  atorvastatin  80 mg Oral Daily   diltiazem  240 mg Oral q morning   furosemide  20 mg Oral Daily   losartan  100 mg Oral Daily   pantoprazole  40 mg Oral Daily   predniSONE  5 mg Oral Q breakfast   rivaroxaban  20 mg Oral Q supper    Antimicrobials: Anti-infectives (From admission, onward)    Start     Dose/Rate Route Frequency Ordered Stop   08/07/21 1800  ampicillin (OMNIPEN) 2 g in sodium chloride 0.9 % 100 mL IVPB        2 g 300 mL/hr over 20 Minutes Intravenous Every 6 hours 08/07/21 1430     08/06/21 1600  ampicillin (OMNIPEN) 2 g in sodium chloride 0.9 % 100 mL IVPB  Status:  Discontinued        2 g 300 mL/hr over 20 Minutes Intravenous Every 4 hours 08/06/21 1415 08/07/21 1430   08/06/21 1000  cefTRIAXone (ROCEPHIN) 2 g in sodium chloride 0.9 % 100 mL IVPB        2 g 200 mL/hr over 30 Minutes Intravenous Every 12 hours 08/06/21 0853     08/05/21 2215  ampicillin (OMNIPEN) 2 g in sodium chloride 0.9 % 100 mL IVPB  Status:  Discontinued        2 g 300 mL/hr over 20 Minutes Intravenous Every 6 hours 08/05/21 2127 08/06/21 1415   08/05/21 2200  azithromycin (ZITHROMAX) tablet 500 mg  Status:  Discontinued        500 mg Oral Daily at bedtime 08/05/21 0233 08/05/21 2127   08/05/21 2200  cefTRIAXone (ROCEPHIN) 2 g  in sodium chloride 0.9 % 100 mL IVPB  Status:  Discontinued        2 g 200 mL/hr over 30 Minutes Intravenous Every 24 hours 08/05/21 0233 08/05/21 2127   08/04/21 2345  cefTRIAXone (  ROCEPHIN) 1 g in sodium chloride 0.9 % 100 mL IVPB        1 g 200 mL/hr over 30 Minutes Intravenous  Once 08/04/21 2339 08/05/21 0056   08/04/21 2345  azithromycin (ZITHROMAX) 500 mg in sodium chloride 0.9 % 250 mL IVPB        500 mg 250 mL/hr over 60 Minutes Intravenous  Once 08/04/21 2339 08/05/21 0156       PRN meds: acetaminophen **OR** acetaminophen, albuterol, melatonin   Objective: Vitals:   08/09/21 0422 08/09/21 0845  BP: (!) 160/85 (!) 165/77  Pulse: 83 81  Resp:  17  Temp: 97.8 F (36.6 C) 97.9 F (36.6 C)  SpO2: 93% 94%    Intake/Output Summary (Last 24 hours) at 08/09/2021 1203 Last data filed at 08/09/2021 0800 Gross per 24 hour  Intake 100 ml  Output --  Net 100 ml    Filed Weights   08/05/21 0500 08/08/21 0535 08/09/21 0422  Weight: 85.1 kg 84.1 kg 84.1 kg   Weight change: 0 kg Body mass index is 29.04 kg/m.   Physical Exam: General exam: Pleasant, elderly Caucasian female.  Not in respiratory distress.  No pain. Skin: No rashes, lesions or ulcers. HEENT: Atraumatic, normocephalic, no obvious bleeding Lungs: Clear to auscultation bilaterally. CVS: Regular rate and rhythm, no murmur GI/Abd soft, nontender, nondistended, bowel sound present CNS: Alert, awake, oriented x3 Psychiatry: Mood appropriate Extremities: No pedal edema, no calf tenderness  Data Review: I have personally reviewed the laboratory data and studies available.  Recent Labs  Lab 08/05/21 0646 08/06/21 0139 08/07/21 0133 08/08/21 0115 08/09/21 0204  WBC 13.3* 11.1* 10.7* 9.3 11.8*  NEUTROABS 11.7* 9.8* 8.6* 6.9 8.9*  HGB 9.2* 9.5* 9.5* 8.6* 10.3*  HCT 28.1* 29.3* 29.2* 26.4* 32.5*  MCV 93.4 94.8 93.9 93.6 95.3  PLT 139* 149* 183 175 207    Recent Labs  Lab 08/05/21 0646 08/06/21 0139  08/07/21 0133 08/08/21 0115 08/09/21 0204  NA 135 138 137 140 139  K 3.5 3.6 3.0* 3.5 3.4*  CL 105 107 107 111 107  CO2 22 23 20* 20* 21*  GLUCOSE 137* 122* 130* 93 87  BUN 28* 23 21 19 15   CREATININE 1.00 0.88 1.05* 1.00 0.99  CALCIUM 8.7* 8.9 9.0 8.6* 8.8*  MG 2.0 2.0 1.9 1.8 1.9     F/u labs ordered Unresulted Labs (From admission, onward)     Start     Ordered   08/08/21 1157  Occult blood card to lab, stool RN will collect  Once,   R       Question:  Specimen to be collected by:  Answer:  RN will collect   08/08/21 1156   08/06/21 7416  Basic metabolic panel  Daily,   R      08/05/21 1504   08/06/21 0500  Magnesium  Daily,   R      08/05/21 1504   08/06/21 0500  CBC with Differential/Platelet  Daily,   R      08/05/21 1504            Signed, Terrilee Croak, MD [facility] 08/09/2021

## 2021-08-10 ENCOUNTER — Inpatient Hospital Stay (HOSPITAL_COMMUNITY): Payer: Medicare Other | Admitting: Anesthesiology

## 2021-08-10 ENCOUNTER — Encounter (HOSPITAL_COMMUNITY): Payer: Self-pay | Admitting: Internal Medicine

## 2021-08-10 ENCOUNTER — Inpatient Hospital Stay (HOSPITAL_COMMUNITY): Payer: Medicare Other

## 2021-08-10 ENCOUNTER — Encounter (HOSPITAL_COMMUNITY)
Admission: EM | Disposition: A | Discharge status: Home Health | Payer: Self-pay | Source: Home / Self Care | Attending: Internal Medicine

## 2021-08-10 DIAGNOSIS — Z953 Presence of xenogenic heart valve: Secondary | ICD-10-CM

## 2021-08-10 DIAGNOSIS — I361 Nonrheumatic tricuspid (valve) insufficiency: Secondary | ICD-10-CM | POA: Diagnosis not present

## 2021-08-10 DIAGNOSIS — T826XXD Infection and inflammatory reaction due to cardiac valve prosthesis, subsequent encounter: Secondary | ICD-10-CM

## 2021-08-10 DIAGNOSIS — I340 Nonrheumatic mitral (valve) insufficiency: Secondary | ICD-10-CM | POA: Diagnosis not present

## 2021-08-10 DIAGNOSIS — I38 Endocarditis, valve unspecified: Secondary | ICD-10-CM

## 2021-08-10 DIAGNOSIS — B952 Enterococcus as the cause of diseases classified elsewhere: Secondary | ICD-10-CM

## 2021-08-10 DIAGNOSIS — I4821 Permanent atrial fibrillation: Secondary | ICD-10-CM

## 2021-08-10 DIAGNOSIS — R6520 Severe sepsis without septic shock: Secondary | ICD-10-CM

## 2021-08-10 DIAGNOSIS — A419 Sepsis, unspecified organism: Secondary | ICD-10-CM

## 2021-08-10 DIAGNOSIS — R7881 Bacteremia: Secondary | ICD-10-CM

## 2021-08-10 DIAGNOSIS — A498 Other bacterial infections of unspecified site: Secondary | ICD-10-CM

## 2021-08-10 HISTORY — PX: TEE WITHOUT CARDIOVERSION: SHX5443

## 2021-08-10 LAB — BASIC METABOLIC PANEL
Anion gap: 12 (ref 5–15)
BUN: 12 mg/dL (ref 8–23)
CO2: 21 mmol/L — ABNORMAL LOW (ref 22–32)
Calcium: 8.5 mg/dL — ABNORMAL LOW (ref 8.9–10.3)
Chloride: 105 mmol/L (ref 98–111)
Creatinine, Ser: 0.99 mg/dL (ref 0.44–1.00)
GFR, Estimated: 57 mL/min — ABNORMAL LOW (ref >60)
Glucose, Bld: 82 mg/dL (ref 70–99)
Potassium: 3.4 mmol/L — ABNORMAL LOW (ref 3.5–5.1)
Sodium: 138 mmol/L (ref 135–145)

## 2021-08-10 LAB — MAGNESIUM: Magnesium: 1.6 mg/dL — ABNORMAL LOW (ref 1.7–2.4)

## 2021-08-10 LAB — CBC WITH DIFFERENTIAL/PLATELET
Abs Immature Granulocytes: 0.09 10*3/uL — ABNORMAL HIGH (ref 0.00–0.07)
Basophils Absolute: 0.1 10*3/uL (ref 0.0–0.1)
Basophils Relative: 1 %
Eosinophils Absolute: 0.2 10*3/uL (ref 0.0–0.5)
Eosinophils Relative: 2 %
HCT: 29.8 % — ABNORMAL LOW (ref 36.0–46.0)
Hemoglobin: 9.6 g/dL — ABNORMAL LOW (ref 12.0–15.0)
Immature Granulocytes: 1 %
Lymphocytes Relative: 14 %
Lymphs Abs: 1.5 10*3/uL (ref 0.7–4.0)
MCH: 30.5 pg (ref 26.0–34.0)
MCHC: 32.2 g/dL (ref 30.0–36.0)
MCV: 94.6 fL (ref 80.0–100.0)
Monocytes Absolute: 0.9 10*3/uL (ref 0.1–1.0)
Monocytes Relative: 8 %
Neutro Abs: 8.3 10*3/uL — ABNORMAL HIGH (ref 1.7–7.7)
Neutrophils Relative %: 74 %
Platelets: 209 10*3/uL (ref 150–400)
RBC: 3.15 MIL/uL — ABNORMAL LOW (ref 3.87–5.11)
RDW: 15.1 % (ref 11.5–15.5)
WBC: 11.1 10*3/uL — ABNORMAL HIGH (ref 4.0–10.5)
nRBC: 0 % (ref 0.0–0.2)

## 2021-08-10 SURGERY — ECHOCARDIOGRAM, TRANSESOPHAGEAL
Anesthesia: Monitor Anesthesia Care

## 2021-08-10 MED ORDER — BUTAMBEN-TETRACAINE-BENZOCAINE 2-2-14 % EX AERO
INHALATION_SPRAY | CUTANEOUS | Status: DC | PRN
  Administered 2021-08-10: 2 via TOPICAL

## 2021-08-10 MED ORDER — PROPOFOL 500 MG/50ML IV EMUL
INTRAVENOUS | Status: DC | PRN
  Administered 2021-08-10: 100 ug/kg/min via INTRAVENOUS

## 2021-08-10 MED ORDER — PROPOFOL 10 MG/ML IV BOLUS
INTRAVENOUS | Status: DC | PRN
  Administered 2021-08-10 (×4): 20 mg via INTRAVENOUS

## 2021-08-10 NOTE — Progress Notes (Deleted)
  Echocardiogram 2D Echocardiogram has been performed.  Fidel Levy 08/10/2021, 9:26 AM

## 2021-08-10 NOTE — TOC Initial Note (Signed)
Transition of Care [facility]) - Initial/Assessment Note    Patient Details  Name: [name] MRN: [id number] Date of Birth: [birth year]  Transition of [facility]) CM/SW Contact:    Marilu Favre, RN Phone Number: 08/10/2021, 4:44 PM  Clinical Narrative:                 Spoke to patient at bedside. Patient in town visiting daughter. Patient does have a [place] PCP DR Maurice Small and medicare. Patient was planning to return to [place], however now that she needs 6 weeks of IV ABX her and her husband are making arrangements to stay for at least 6 weeks.    Patient aware she will have a HHRN however HHRN will not be present every time a dose is due. Prior to discharge Pam with [employer] will provide patient and husband education on ABX at home. Patient voiced understanding.   [place] ( [place]) will provide [facility], spoke to [place].  Expected Discharge Plan: [place]     Patient Goals and CMS Choice Patient states their goals for this hospitalization and ongoing recovery are:: to return home CMS Medicare.gov Compare Post Acute Care list provided to:: Patient Choice offered to / list presented to : Patient  Expected Discharge Plan and Services Expected Discharge Plan: [place]   Discharge Planning Services: CM Consult Post Acute Care Choice: [place] arrangements for the past 2 months: Single Family Home                   DME Agency: NA       HH Arranged: RN [place] Agency: [place] ([place]) Date [place]: 08/10/21 Time [place]: 40 Representative spoke with at [place]: [place] Arrangements/Services Living arrangements for the past 2 months: [place] with:: Spouse Patient language and need for interpreter reviewed:: Yes Do you feel safe going back to the place where you live?: Yes      Need for Family Participation in Patient Care: Yes  (Comment) Care giver support system in place?: Yes (comment)   Criminal Activity/Legal Involvement Pertinent to Current Situation/Hospitalization: No - Comment as needed  Activities of Daily Living Home Assistive Devices/Equipment: Cane (specify quad or straight) ADL Screening (condition at time of admission) Patient's cognitive ability adequate to safely complete daily activities?: Yes Is the patient deaf or have difficulty hearing?: No Does the patient have difficulty seeing, even when wearing glasses/contacts?: No Does the patient have difficulty concentrating, remembering, or making decisions?: No Patient able to express need for assistance with ADLs?: Yes Does the patient have difficulty dressing or bathing?: No Independently performs ADLs?: Yes (appropriate for developmental age) Does the patient have difficulty walking or climbing stairs?: No Weakness of Legs: Both Weakness of Arms/Hands: None  Permission Sought/Granted   Permission granted to share information with : No              Emotional Assessment Appearance:: Appears stated age Attitude/Demeanor/Rapport: Engaged Affect (typically observed): Accepting Orientation: : Oriented to Self, Oriented to Place, Oriented to  Time, Oriented to Situation Alcohol / Substance Use: Not Applicable Psych Involvement: No (comment)  Admission diagnosis:  Hypoxia [R09.02] CAP (community acquired pneumonia) [J18.9] Exertional shortness of breath [R06.02] Hypervolemia, unspecified hypervolemia type [E87.70] Community acquired pneumonia, unspecified laterality [J18.9] Acute respiratory failure with hypoxia ([place]) [J96.01] Patient Active Problem List   Diagnosis Date Noted   Bacteremia  CAP (community acquired pneumonia) 08/05/2021   Severe sepsis ([place]) 08/05/2021   Chronic anticoagulation - on Xarelto for afib 08/05/2021   Acute on chronic anemia 08/05/2021   Demand ischemia ([place]) 08/05/2021   H/O total shoulder replacement,  left 10/26/2019   S/p TAVR (transcatheter aortic valve replacement), bioprosthetic 12/14/16 12/16/2016   CAD (coronary artery disease), native coronary artery 12/16/2016   Pulmonary HTN ([place])    S/P left TKA 11/11/2015   Overweight (BMI 25.0-29.9) 01/29/2015   S/P right THA, AA 01/28/2015   Chronic diastolic congestive heart failure (HCC)    OSA (obstructive sleep apnea) 02/25/2014   Permanent atrial fibrillation ([place])    Expected blood loss anemia 07/18/2013   Hypercholesteremia    Infected postoperative breast seroma    Breast implant removal status    S/P carpal tunnel release    Cancer (HCC)    Shortness of breath    GERD (gastroesophageal reflux disease)    Complication of anesthesia    Menopause    Breast cancer ([place])    Lower back pain    TIA (transient ischemic attack)    Severe aortic stenosis    Fibromyalgia    Arthritis    Osteoarthrosis, unspecified whether generalized or localized, shoulder region 08/11/2012   hx: breast cancer, left, invasive lobular, multifocal, receptor + her 2 - 11/15/2011   HTN (hypertension) 11/13/2011   Retroperitoneal hematoma 03/19/2011   PCP:  Maurice Small, MD (Inactive) Pharmacy:   [id number] - [place], [place] [address] [place] [address] Phone: [phone] Fax: [phone]     Social Determinants of Health (SDOH) Interventions    Readmission Risk Interventions No flowsheet data found.

## 2021-08-10 NOTE — Anesthesia Procedure Notes (Signed)
Procedure Name: MAC Date/Time: 08/10/2021 8:45 AM Performed by: Amadeo Garnet, CRNA Pre-anesthesia Checklist: Patient identified, Emergency Drugs available, Patient being monitored and Suction available Patient Re-evaluated:Patient Re-evaluated prior to induction Oxygen Delivery Method: Nasal cannula Preoxygenation: Pre-oxygenation with 100% oxygen Induction Type: IV induction Placement Confirmation: positive ETCO2 Dental Injury: Teeth and Oropharynx as per pre-operative assessment

## 2021-08-10 NOTE — Anesthesia Preprocedure Evaluation (Addendum)
Anesthesia Evaluation  Patient identified by MRN, date of birth, ID band Patient awake    Reviewed: Allergy & Precautions, NPO status , Patient's Chart, lab work & pertinent test results  Airway Mallampati: III  TM Distance: >3 FB Neck ROM: Full  Mouth opening: Limited Mouth Opening  Dental  (+) Teeth Intact, Dental Advisory Given   Pulmonary sleep apnea and Continuous Positive Airway Pressure Ventilation , former smoker,    Pulmonary exam normal breath sounds clear to auscultation       Cardiovascular hypertension, Pt. on medications + angina + CAD and +CHF  Normal cardiovascular exam+ dysrhythmias Atrial Fibrillation + Valvular Problems/Murmurs (s/p TAVR 2018) AS  Rhythm:Regular Rate:Normal  TTE 2022 1. Left ventricular ejection fraction, by estimation, is 55 to 60%. The  left ventricle has normal function. The left ventricle has no regional  wall motion abnormalities. There is mild concentric left ventricular  hypertrophy. Left ventricular diastolic  parameters are indeterminate.  2. Right ventricular systolic function is normal. The right ventricular  size is normal.  3. Left atrial size was severely dilated.  4. Right atrial size was severely dilated.  5. The mitral valve is normal in structure. No evidence of mitral valve  regurgitation. No evidence of mitral stenosis.  6. Tricuspid valve regurgitation is mild to moderate.  7. The aortic valve has been repaired/replaced. Aortic valve  regurgitation is not visualized. No aortic stenosis is present. There is a  26 mm Edwards Sapien prosthetic (TAVR) valve present in the aortic  position. Aortic valve mean gradient measures 19.4  mmHg. Aortic valve Vmax measures 2.78 m/s.  8. The inferior vena cava is normal in size with greater than 50%  respiratory variability, suggesting right atrial pressure of 3 mmHg.  Cath 2018 (prior to TAVR) Ost RCA lesion, 35  %stenosed. Ost 1st Diag lesion, 70 %stenosed. The left ventricular systolic function is normal. LV end diastolic pressure is normal. There is severe aortic valve stenosis.   Normal LV function with at least 1+ MR.  Mild right heart pressure elevation with mild pulmonary hypertension.  Moderately severe aortic valve stenosis with a peak gradient of 29 and mean gradient of 21.7.  Aortic valve area calculated to 0.8 cm2.   Mild coronary obstructive disease with 70% focal eccentric ostial narrowing of the first diagonal branch of the LAD and superior takeoff dominant RCA with mild 30-40% very proximal stenosis eccentrically.  Normal ramus intermediate and left circumflex coronary arteries.  Mild calcification of the ascending aorta.  Supravalvular aortography was not performed to assess for aortic regurgitation.    Neuro/Psych TIAnegative psych ROS   GI/Hepatic Neg liver ROS, GERD  ,  Endo/Other  negative endocrine ROS  Renal/GU negative Renal ROS  negative genitourinary   Musculoskeletal  (+) Arthritis , Fibromyalgia -  Abdominal   Peds  Hematology  (+) Blood dyscrasia (on xarelto, Hgb 9.6), anemia ,   Anesthesia Other Findings TEE for bacteremia  Reproductive/Obstetrics                           Anesthesia Physical Anesthesia Plan  ASA: 3  Anesthesia Plan: MAC   Post-op Pain Management:    Induction: Intravenous  PONV Risk Score and Plan: 2 and Propofol infusion and Treatment may vary due to age or medical condition  Airway Management Planned: Natural Airway  Additional Equipment:   Intra-op Plan:   Post-operative Plan:   Informed Consent: I have reviewed the patients  History and Physical, chart, labs and discussed the procedure including the risks, benefits and alternatives for the proposed anesthesia with the patient or authorized representative who has indicated his/her understanding and acceptance.     Dental advisory  given  Plan Discussed with: CRNA  Anesthesia Plan Comments:        Anesthesia Quick Evaluation

## 2021-08-10 NOTE — Progress Notes (Signed)
PROGRESS NOTE  [name]  DOB: 1936-03-16  PCP: Maurice Small, MD (Inactive) HER:[phone]  DOA: 08/04/2021  LOS: 5 days  Hospital Day: 7   Chief Complaint  Patient presents with   Shortness of Breath    Brief narrative: [name] is a 85 y.o. female with PMH significant for HTN, HLD, permanent A. fib on Xarelto, TIA, severe AS  s/p TAVR, chronic diastolic CHF, history of PE, pulmonary hypertension, GERD Patient presented to the ED on 10/18 with sudden onset of shaking chills at home with subjective fever.  Travel history significant for recent return from [place] about 3 weeks ago. In the ED, patient was hypoxic to 89% and required supplemental oxygen. CT angio chest did not show any evidence of pulm embolism but showed subtle scattered tree-in-bud nodular densities in the upper lobes bilaterally. Labs showed first troponin 53 and second at 72, WBC count 17.9, lactic acid 2.6, BNP 474, hemoglobin 9.8. EKG showed A. fib with normal rate, no ischemic changes.    Admitted to hospitalist service. See details as below  Subjective: Patient was seen and examined this morning.  Pleasant elderly Caucasian female.  Sitting up in chair.  Not in distress.  Feels better than at presentation. Underwent TEE.  Seems to be having prosthetic aortic valve endocarditis.  Assessment/Plan: Enterococcus faecalis bacteremia Prosthetic aortic valve endocarditis -Blood culture sent on admission grew Enterococcus faecalis.  ID consult was obtained and patient was started on IV ampicillin.  Clinically improving. -10/24, patient underwent TEE which suggested prosthetic aortic valve endocarditis.  Patient had TAVR done in 2018 by Dr. Angelena Form.  Updated ID.  Paged Dr. Angelena Form as well.  Atypical pneumonia Sepsis - POA -In the setting of recent travel, patient presented with chills, hypoxia, leukocytosis and lactic acidosis and elevated procalcitonin level.  Chest imaging did not show any  consolidation or symptoms highly suggestive of pneumonia. -Currently on IV Rocephin -No cough, fever. -WBC count improving.  Lactic acid level normalized, progressively improving.   Recent Labs  Lab 08/04/21 2150 08/05/21 0039 08/05/21 0646 08/06/21 0139 08/07/21 0133 08/08/21 0115 08/09/21 0204 08/10/21 0109  WBC  --   --  13.3* 11.1* 10.7* 9.3 11.8* 11.1*  LATICACIDVEN 2.6* 2.6* 1.4  --   --   --   --   --   PROCALCITON  --   --  3.83 3.30 2.51  --   --   --     Acute respiratory failure with hypoxia -Initially required supplemental oxygen.  Currently breathing on room air.  Encourage ambulation.Marland Kitchen  Permanent A. Fib -Continue Cardizem, Xarelto  History of TIA Hyperlipidemia -On Xarelto, Lipitor, Zetia  Essential hypertension -Currently on Cardizem, losartan and Lasix.  Patient reports that recently lisinopril was stopped by her PCP for cough. -Blood pressure overall controlled well.  Was elevated this morning probably prior to the procedure.  Continue to monitor.    Pulmonary hypertension -Follows up with cardiologist as an outpatient.  There was a plan to obtain PFT and DLCO as an outpatient.  Continue to follow-up as an outpatient.   Acute on chronic anemia -Patient has acute drop in hemoglobin from normal baseline to between 9 and 10 in last 3 days.  No active bleeding.   Recent Labs    08/05/21 0646 08/06/21 0139 08/07/21 0133 08/08/21 0115 08/09/21 0204 08/10/21 0109  HGB 9.2* 9.5* 9.5* 8.6* 10.3* 9.6*  MCV 93.4 94.8 93.9 93.6 95.3 94.6  FERRITIN  --   --  81  --   --   --   TIBC 305  --   --   --   --   --   IRON 16*  --   --   --   --   --     GERD (gastroesophageal reflux disease) -Continue Protonix.  ?? History of rheumatoid arthritis -On prednisone 5 mg daily.  Mobility: Encourage ambulation Code Status:   Code Status: Full Code  Nutritional status: Body mass index is 29.04 kg/m.     Diet:  Diet Order     None      DVT prophylaxis:   SCDs Start: 08/05/21 0234 rivaroxaban (XARELTO) tablet 20 mg   Antimicrobials: IV ampicillin, IV Rocephin Fluid: None Consultants: Infectious disease Family Communication: None at bedside  Status is: Inpatient Remains inpatient appropriate because: New finding of prosthetic valve endocarditis  Dispo: The patient is from: Home              Anticipated d/c is to: Home.  Depending on the aggressiveness of treatment for endocarditis              Patient currently is not medically stable to d/c.   Difficult to place patient No     Infusions:   ampicillin (OMNIPEN) IV 2 g (08/10/21 0553)   cefTRIAXone (ROCEPHIN)  IV 2 g (08/10/21 1012)    Scheduled Meds:  atorvastatin  80 mg Oral Daily   diltiazem  240 mg Oral q morning   furosemide  20 mg Oral Daily   losartan  100 mg Oral Daily   pantoprazole  40 mg Oral Daily   predniSONE  5 mg Oral Q breakfast   rivaroxaban  20 mg Oral Q supper    Antimicrobials: Anti-infectives (From admission, onward)    Start     Dose/Rate Route Frequency Ordered Stop   08/07/21 1800  ampicillin (OMNIPEN) 2 g in sodium chloride 0.9 % 100 mL IVPB        2 g 300 mL/hr over 20 Minutes Intravenous Every 6 hours 08/07/21 1430     08/06/21 1600  ampicillin (OMNIPEN) 2 g in sodium chloride 0.9 % 100 mL IVPB  Status:  Discontinued        2 g 300 mL/hr over 20 Minutes Intravenous Every 4 hours 08/06/21 1415 08/07/21 1430   08/06/21 1000  cefTRIAXone (ROCEPHIN) 2 g in sodium chloride 0.9 % 100 mL IVPB        2 g 200 mL/hr over 30 Minutes Intravenous Every 12 hours 08/06/21 0853     08/05/21 2215  ampicillin (OMNIPEN) 2 g in sodium chloride 0.9 % 100 mL IVPB  Status:  Discontinued        2 g 300 mL/hr over 20 Minutes Intravenous Every 6 hours 08/05/21 2127 08/06/21 1415   08/05/21 2200  azithromycin (ZITHROMAX) tablet 500 mg  Status:  Discontinued        500 mg Oral Daily at bedtime 08/05/21 0233 08/05/21 2127   08/05/21 2200  cefTRIAXone (ROCEPHIN) 2 g in  sodium chloride 0.9 % 100 mL IVPB  Status:  Discontinued        2 g 200 mL/hr over 30 Minutes Intravenous Every 24 hours 08/05/21 0233 08/05/21 2127   08/04/21 2345  cefTRIAXone (ROCEPHIN) 1 g in sodium chloride 0.9 % 100 mL IVPB        1 g 200 mL/hr over 30 Minutes Intravenous  Once 08/04/21 2339 08/05/21 0056   08/04/21 2345  azithromycin (ZITHROMAX) 500 mg in sodium chloride 0.9 % 250 mL IVPB        500 mg 250 mL/hr over 60 Minutes Intravenous  Once 08/04/21 2339 08/05/21 0156       PRN meds: acetaminophen **OR** acetaminophen, albuterol, melatonin   Objective: Vitals:   08/10/21 0930 08/10/21 0946  BP: (!) 163/96 (!) 153/85  Pulse: 86 (!) 54  Resp: 15 16  Temp:  97.6 F (36.4 C)  SpO2: 100% 92%    Intake/Output Summary (Last 24 hours) at 08/10/2021 1133 Last data filed at 08/10/2021 0912 Gross per 24 hour  Intake 1180.4 ml  Output --  Net 1180.4 ml    Filed Weights   08/08/21 0535 08/09/21 0422 08/10/21 0803  Weight: 84.1 kg 84.1 kg 84.1 kg   Weight change:  Body mass index is 29.04 kg/m.   Physical Exam: General exam: Pleasant, elderly Caucasian female.  Sitting up in chair.  Not in distress.  Not requiring supplemental oxygen Skin: No rashes, lesions or ulcers. HEENT: Atraumatic, normocephalic, no obvious bleeding Lungs: Clear to auscultation bilaterally. CVS: Regular rate and rhythm, no murmur GI/Abd soft, nontender, nondistended, bowel sound present CNS: Alert, awake, oriented x3 Psychiatry: Mood appropriate Extremities: No pedal edema, no calf tenderness  Data Review: I have personally reviewed the laboratory data and studies available.  Recent Labs  Lab 08/06/21 0139 08/07/21 0133 08/08/21 0115 08/09/21 0204 08/10/21 0109  WBC 11.1* 10.7* 9.3 11.8* 11.1*  NEUTROABS 9.8* 8.6* 6.9 8.9* 8.3*  HGB 9.5* 9.5* 8.6* 10.3* 9.6*  HCT 29.3* 29.2* 26.4* 32.5* 29.8*  MCV 94.8 93.9 93.6 95.3 94.6  PLT 149* 183 175 207 209    Recent Labs  Lab  08/06/21 0139 08/07/21 0133 08/08/21 0115 08/09/21 0204 08/10/21 0109  NA 138 137 140 139 138  K 3.6 3.0* 3.5 3.4* 3.4*  CL 107 107 111 107 105  CO2 23 20* 20* 21* 21*  GLUCOSE 122* 130* 93 87 82  BUN 23 21 19 15 12   CREATININE 0.88 1.05* 1.00 0.99 0.99  CALCIUM 8.9 9.0 8.6* 8.8* 8.5*  MG 2.0 1.9 1.8 1.9 1.6*     F/u labs ordered Unresulted Labs (From admission, onward)     Start     Ordered   08/08/21 1157  Occult blood card to lab, stool RN will collect  Once,   R       Question:  Specimen to be collected by:  Answer:  RN will collect   08/08/21 1156            Signed, Terrilee Croak, MD [facility] 08/10/2021

## 2021-08-10 NOTE — CV Procedure (Addendum)
    TRANSESOPHAGEAL ECHOCARDIOGRAM   NAME:  [name]   MRN: [id number] DOB:  11/18/1935   ADMIT DATE: 08/04/2021  INDICATIONS: Bacteremia  PROCEDURE:   Informed consent was obtained prior to the procedure. The risks, benefits and alternatives for the procedure were discussed and the patient comprehended these risks.  Risks include, but are not limited to, cough, sore throat, vomiting, nausea, somnolence, esophageal and stomach trauma or perforation, bleeding, low blood pressure, aspiration, pneumonia, infection, trauma to the teeth and death.    Procedural time out performed. The oropharynx was anesthetized with topical 1% cetacaine.    Patient received monitored anesthesia care under the supervision of Dr. Lanetta Inch. Patient received a total of 263 mg propofol during the procedure.  The transesophageal probe was inserted in the esophagus and stomach without difficulty and multiple views were obtained.    COMPLICATIONS:    There were no immediate complications.  FINDINGS:  LEFT VENTRICLE: EF = 55-60%.  No regional wall motion abnormalities.  RIGHT VENTRICLE: Normal size and function.   LEFT ATRIUM: No thrombus/mass. Severely dilated  LEFT ATRIAL APPENDAGE: No thrombus/mass.   RIGHT ATRIUM: No thrombus/mass. Severely dilated  AORTIC VALVE:  S/P TAVR with Edwards Sapien 3, 65mm (date 12/14/2016). There is a vegetation seen on the prosthetic aortic valve, small in size, predominantly on the ventricular side of the aortic valve but small filament also see on aortic side of the valve. There is trivial regurgitation.  MITRAL VALVE:    Normal structure. Mild-moderate regurgitation. No vegetation.  TRICUSPID VALVE: Normal structure. Mild-moderate regurgitation. No vegetation.  PULMONIC VALVE: Grossly normal structure. Mild regurgitation. No apparent vegetation.  INTERATRIAL SEPTUM: No PFO or ASD seen by color Doppler.  PERICARDIUM: trivial effusion noted.  DESCENDING  AORTA: Moderate diffuse plaque seen   CONCLUSION: Positive study for endocarditis of prosthetic aortic valve. Findings will be communicated to primary team.   Buford Dresser, MD, PhD [facility]  [address] [place], [place] [address] [phone]   9:07 AM

## 2021-08-10 NOTE — Interval H&P Note (Signed)
History and Physical Interval Note:  08/10/2021 7:42 AM  [name]  has presented today for surgery, with the diagnosis of BACTEREMIA.  The various methods of treatment have been discussed with the patient and family. After consideration of risks, benefits and other options for treatment, the patient has consented to  Procedure(s): TRANSESOPHAGEAL ECHOCARDIOGRAM (TEE) (N/A) as a surgical intervention.  The patient's history has been reviewed, patient examined, no change in status, stable for surgery.  I have reviewed the patient's chart and labs.  Questions were answered to the patient's satisfaction.     [name] Gave

## 2021-08-10 NOTE — Consult Note (Addendum)
Cardiology Consultation:   Patient ID: DHANA TOTTON MRN: [id number]; DOB: 06/18/1936  Admit date: 08/04/2021 Date of Consult: 08/10/2021  PCP:  Maurice Small, MD (Inactive)   [facility] Providers Cardiologist:  Fransico Him, MD      Patient Profile:   [name] is a 85 y.o. female with a PMH of chronic atrial fibrillation, chronic diastolic CHF, non-obstructive CAD, Aortic stenosis s/p TAVR in 2018, HTN, HLD, OSA on CPAP, GERD, breast cancer, and obesity who is being seen 08/10/2021 for the evaluation of endocarditis at the request of Dr. Pietro Cassis.  History of Present Illness:   [name] was last evaluated by cardiology at an outpatient visit with Dr. Radford Pax 02/02/21 at which time she was doing well from a cardiac standpoint without complaints. She was recommended to undergo a repeat echo for monitoring of Pulm HTN and AS s/p TAVR. This occurred 02/2021 showing EF 60-65%, moderate LVH, indeterminate LV diastolic function, no RWMA, moderately elevated PA pressures, severe biatrial enlargement, and AV s/p TAVR stable from previous. Given worsening pulm HTN she was recommended to undergo PFT testing (though does not appear this was completed) and a VQ scan which was negative for PE.   She presented to the urgent care 07/27/21 with complaints of fatigue x 1 week. She had an EKG and blood work which showed mild leukocytosis, though left due to long wait. She then presented to [facility] ED 08/04/21 with complaints of rigors, weakness, and SOB. She was transferred to [facility] and admitted to the hospital for sepsis with suspected CAP. Blood cultures grew enterococcus faecalis prompting ID involvement. She has continued on IV antibiotics, currently ampicillin. She underwent an echocardiogram which showed EF 55-60%, mild LVH, severe biatrial enlargement, mild-moderate TR, and AV s/p TAVR. TEE showed vegetation  on the prosthetic aortic valve. Cardiology asked to see for endocarditis of TAVR.    At the time of this evaluation she is resting comfortably in the bedside chair without complaints. She feels back to her usual self. She has been ambulating independently in her room without trouble. We discussed her endocarditis and anticipated long term antibiotics. She is hopeful to complete this course at home with her husbands help. She has no complaints of CP, SOB, palpitations, dizziness, lightheadedness, syncope, orthopnea, PND, or changes in her mild LE edema.    Past Medical History:  Diagnosis Date   Anginal pain ([place])    Aortic stenosis    severe AS with moderate AR by echo with normal LVF   Arthritis    CAD in native artery, non obstructive cath 11/2016.  12/16/2016   Cancer ([place])    left breast mastectomy(cancer)-surgery only   Chronic diastolic congestive heart failure ([place])    Complication of anesthesia    extremely claustophobic(only occurs with narcotic medications as stated per pt)   Diverticulosis    Dysrhythmia    Afib   Fibromyalgia    GERD (gastroesophageal reflux disease)    Hard of hearing    wears hearing aides    History of breast cancer no recurrence   dx 2004   s/p  left breast mastectomy w/ node dissection/   no chemo or radiation   History of TIA (transient ischemic attack)    per scan--  no residual   Hyperlipidemia    Hypertension    Nocturia    OSA on CPAP     severe/  AHI 34/hr   Permanent atrial fibrillation ([place])    on Xarelto for [place]  score of 5   Pulmonary HTN ([facility])    PASP 28mmHg by echo 11/2016   Urge urinary incontinence    Wears glasses     Past Surgical History:  Procedure Laterality Date   ABDOMINOPLASTY  [place]   BREAST SURGERY Left 2004   mastectomy    CARDIAC CATHETERIZATION N/A 11/19/2016   Procedure: Right/Left Heart Cath and Coronary Angiography;  Surgeon: Troy Sine, MD;  Location: [place] [facility];  Service: Cardiovascular;  Laterality: N/A;   CARPAL TUNNEL RELEASE Right 07-08-2004    COLONOSCOPY WITH ESOPHAGOGASTRODUODENOSCOPY (EGD)  2006   EXPLANTATION BREAST IMPLANT AND DEBRIDEMENT Left 09/15/2005   INTERSTIM IMPLANT PLACEMENT N/A 08/22/2014   Procedure: Barrie Lyme IMPLANT STAGE ONE/TWO;  Surgeon: Reece Packer, MD;  Location: [facility];  Service: Urology;  Laterality: N/A;   JOINT REPLACEMENT Left 2017   knee replacement   MASTECTOMY     MASTECTOMY, MODIFIED RADICAL W/RECONSTRUCTION  09-16-2003   left breast W/ SLN DISSECTION (pt states had 13 operations for implant infection    REPLACE TISSUE EXPANDER LEFT BREAST AND TOTAL CAPSULECTOMY  08-05-2006   REVERSE SHOULDER ARTHROPLASTY Left 10/26/2019   Procedure: REVERSE SHOULDER ARTHROPLASTY;  Surgeon: Netta Cedars, MD;  Location: [facility];  Service: Orthopedics;  Laterality: Left;  with interscalene block   REVISION BREAST RECONSTRUCTION Left 11-11-2004   12-03-2004  DRAINAGE SEREMA LEFT CHEST   TEE WITHOUT CARDIOVERSION N/A 12/14/2016   Procedure: TRANSESOPHAGEAL ECHOCARDIOGRAM (TEE);  Surgeon: Burnell Blanks, MD;  Location: [place];  Service: Open Heart Surgery;  Laterality: N/A;   TOTAL HIP ARTHROPLASTY Left 07/17/2013   Procedure: LEFT TOTAL HIP ARTHROPLASTY ANTERIOR APPROACH;  Surgeon: Mauri Pole, MD;  Location: [facility];  Service: Orthopedics;  Laterality: Left;   TOTAL HIP ARTHROPLASTY Right 01/28/2015   Procedure: RIGHT TOTAL HIP ARTHROPLASTY ANTERIOR APPROACH;  Surgeon: Paralee Cancel, MD;  Location: [facility];  Service: Orthopedics;  Laterality: Right;   TOTAL KNEE ARTHROPLASTY Left 11/11/2015   Procedure: LEFT TOTAL KNEE ARTHROPLASTY;  Surgeon: Paralee Cancel, MD;  Location: [facility];  Service: Orthopedics;  Laterality: Left;   TOTAL SHOULDER ARTHROPLASTY  08/11/2012   Procedure: TOTAL SHOULDER ARTHROPLASTY;  Surgeon: [place]in Schooling, MD;  Location: [place];  Service: Orthopedics;  Laterality: Right;  RIGHT  TOTAL SHOULDER  ARTHROPLASTY    TRANSCATHETER AORTIC VALVE REPLACEMENT, TRANSFEMORAL N/A  12/14/2016   Procedure: TRANSCATHETER AORTIC VALVE REPLACEMENT, TRANSFEMORAL;  Surgeon: Burnell Blanks, MD;  Location: [place];  Service: Open Heart Surgery;  Laterality: N/A;   TRANSTHORACIC ECHOCARDIOGRAM  02-25-2014   moderate LVH/  ef 55-60%/  mod. to sev. calcification AV with mild to moderate AV stenosis/  mild to mod. AR and MR/ mild PR/   moderate to severe LAE   TUBAL LIGATION  1973     Home Medications:  Prior to Admission medications   Medication Sig Start Date End Date Taking? Authorizing Provider  alendronate (FOSAMAX) 70 MG tablet Take 70 mg by mouth every Saturday. 09/19/19  Yes [provider]  amoxicillin (AMOXIL) 500 MG capsule Take 2,000 mg by mouth See admin instructions. Take 4 capsules (2000 mg) by mouth 1 hour prior to dental work 09/03/15  Yes [provider]  atorvastatin (LIPITOR) 80 MG tablet Take 1 tablet (80 mg total) by mouth daily. 05/16/19  Yes Turner, Eber Hong, MD  DILT-XR 240 MG 24 hr capsule Take 240 mg by mouth every morning. 07/23/21  Yes  [provider]  ezetimibe (ZETIA) 10 MG tablet Take 1 tablet (10 mg total) by mouth daily. 06/28/19 08/05/21 Yes Turner, Eber Hong, MD  furosemide (LASIX) 20 MG tablet Take 20 mg by mouth daily.    Yes [provider]  hydrOXYzine (ATARAX/VISTARIL) 25 MG tablet Take 25 mg by mouth every 12 (twelve) hours as needed for anxiety.   Yes [provider]  losartan (COZAAR) 100 MG tablet Take 1 tablet (100 mg total) by mouth daily. 08/08/19  Yes Turner, Eber Hong, MD  Melatonin 10 MG TABS Take 10 mg by mouth at bedtime as needed (for sleep).    Yes [provider]  nitroGLYCERIN (NITROSTAT) 0.4 MG SL tablet Place 0.4 mg under the tongue every 5 (five) minutes as needed for chest pain.    Yes [provider]  Nutritional Supplements (VITAMIN D BOOSTER PO) Take 800 Units by mouth at bedtime. Kalcipos-d forte   Yes [provider]  omeprazole (PRILOSEC) 20 MG capsule  Take 20 mg by mouth daily as needed (acid reflux/indigestion.).    Yes [provider]  predniSONE (DELTASONE) 5 MG tablet Take 5 mg by mouth daily with breakfast.   Yes [provider]  rivaroxaban (XARELTO) 20 MG TABS tablet Take 20 mg by mouth daily with supper.   Yes [provider]  triamcinolone ointment (KENALOG) 0.1 % Apply 1 application topically 2 (two) times daily as needed (affected areas of feet).  08/01/19  Yes [provider]  diltiazem (CARDIZEM CD) 300 MG 24 hr capsule Take 1 capsule (300 mg total) by mouth daily. Patient not taking: No sig reported 09/21/19   Sueanne Margarita, MD    Inpatient Medications: Scheduled Meds:  atorvastatin  80 mg Oral Daily   diltiazem  240 mg Oral q morning   furosemide  20 mg Oral Daily   losartan  100 mg Oral Daily   pantoprazole  40 mg Oral Daily   predniSONE  5 mg Oral Q breakfast   rivaroxaban  20 mg Oral Q supper   Continuous Infusions:  ampicillin (OMNIPEN) IV 2 g (08/10/21 1218)   cefTRIAXone (ROCEPHIN)  IV 2 g (08/10/21 1012)   PRN Meds: acetaminophen **OR** acetaminophen, albuterol, melatonin  Allergies:    Allergies  Allergen Reactions   Beta Adrenergic Blockers Shortness Of Breath   Other     Narcotic pain medications - anxiety attacks   Oxycodone Shortness Of Breath and Other (See Comments)    Tolerates Dilaudid, Fentanyl   Sotalol Hcl Shortness Of Breath and Other (See Comments)   Tramadol Anxiety and Other (See Comments)    Can take if absolutely necessary   Clonidine Derivatives Palpitations    bradycardia   Crestor [Rosuvastatin Calcium] Other (See Comments)    MYALGIAS   Latex Rash    Per patient-bandaids- no problems with gloves   Rosuvastatin Calcium Nausea Only   Rosuvastatin    Tape Rash     bandaids     Social History:   Social History   Socioeconomic History   Marital status: Married    Spouse name: Not on file   Number of children: Not on file   Years of  education: Not on file   Highest education level: Not on file  Occupational History   Not on file  Tobacco Use   Smoking status: Former    Packs/day: 0.25    Years: 8.00    Pack years: 2.00    Types: Cigarettes  Quit date: 10/18/1966    Years since quitting: 54.8   Smokeless tobacco: Never  Vaping Use   Vaping Use: Never used  Substance and Sexual Activity   Alcohol use: No   Drug use: No   Sexual activity: Yes  Other Topics Concern   Not on file  Social History Narrative   Not on file   Social Determinants of Health   Financial Resource Strain: Not on file  Food Insecurity: Not on file  Transportation Needs: Not on file  Physical Activity: Not on file  Stress: Not on file  Social Connections: Not on file  Intimate Partner Violence: Not on file    Family History:    Family History  Problem Relation Age of Onset   Heart disease Mother    Heart disease Father      ROS:  Please see the history of present illness.   All other ROS reviewed and negative.     Physical Exam/Data:   Vitals:   08/10/21 0807 08/10/21 0915 08/10/21 0930 08/10/21 0946  BP: (!) 196/98 (!) 153/83 (!) 163/96 (!) 153/85  Pulse: 92 88 86 (!) 54  Resp: 11 16 15 16   Temp:  98.2 F (36.8 C)  97.6 F (36.4 C)  TempSrc:    Oral  SpO2: 100% 98% 100% 92%  Weight:      Height:        Intake/Output Summary (Last 24 hours) at 08/10/2021 1605 Last data filed at 08/10/2021 0912 Gross per 24 hour  Intake 1180.4 ml  Output --  Net 1180.4 ml   Last 3 Weights 08/10/2021 08/09/2021 08/08/2021  Weight (lbs) 185 lb 6.5 oz 185 lb 6.5 oz 185 lb 6.5 oz  Weight (kg) 84.1 kg 84.1 kg 84.1 kg     Body mass index is 29.04 kg/m.  General:  Well nourished, well developed, in no acute distress HEENT: sclera anicteric Neck: no JVD Vascular: No carotid bruits; Distal pulses 2+ bilaterally Cardiac:  normal S1, S2; RRR; + murmur, no rubs or gallops Lungs:  clear to auscultation bilaterally, no wheezing,  rhonchi or rales  Abd: soft, nontender, no hepatomegaly  Ext: trace-1+ LE edema Musculoskeletal:  No deformities, BUE and BLE strength normal and equal Skin: warm and dry  Neuro:  CNs 2-12 intact, no focal abnormalities noted Psych:  Normal affect   EKG:  The EKG was personally reviewed and demonstrates:  atrial fibrillation, rate 78 bpm, non-specific T wave abnormalities Telemetry:  Not on telemetry  Relevant CV Studies:  TEE 08/10/21: LEFT VENTRICLE: EF = 55-60%.  No regional wall motion abnormalities.  RIGHT VENTRICLE: Normal size and function.    LEFT ATRIUM: No thrombus/mass. Severely dilated   LEFT ATRIAL APPENDAGE: No thrombus/mass.    RIGHT ATRIUM: No thrombus/mass. Severely dilated   AORTIC VALVE:  S/P TAVR with Edwards Sapien 3, 72mm (date 12/14/2016). There is a vegetation seen on the prosthetic aortic valve, small in size, predominantly on the ventricular side of the aortic valve but small filament also see on aortic side of the valve. There is trivial regurgitation.   MITRAL VALVE:    Normal structure. Mild regurgitation. No vegetation.   TRICUSPID VALVE: Normal structure. MIld regurgitation. No vegetation.   PULMONIC VALVE: Grossly normal structure. Mild regurgitation. No apparent vegetation.   INTERATRIAL SEPTUM: No PFO or ASD seen by color Doppler.   PERICARDIUM: No effusion noted.   DESCENDING AORTA: Moderate diffuse plaque seen    CONCLUSION: Positive study for endocarditis of prosthetic  aortic valve. Findings will be communicated to primary team.    Echocardiogram 08/05/21: 1. Left ventricular ejection fraction, by estimation, is 55 to 60%. The  left ventricle has normal function. The left ventricle has no regional  wall motion abnormalities. There is mild concentric left ventricular  hypertrophy. Left ventricular diastolic  parameters are indeterminate.   2. Right ventricular systolic function is normal. The right ventricular  size is normal.   3.  Left atrial size was severely dilated.   4. Right atrial size was severely dilated.   5. The mitral valve is normal in structure. No evidence of mitral valve  regurgitation. No evidence of mitral stenosis.   6. Tricuspid valve regurgitation is mild to moderate.   7. The aortic valve has been repaired/replaced. Aortic valve  regurgitation is not visualized. No aortic stenosis is present. There is a  26 mm Edwards Sapien prosthetic (TAVR) valve present in the aortic  position. Aortic valve mean gradient measures 19.4   mmHg. Aortic valve Vmax measures 2.78 m/s.   8. The inferior vena cava is normal in size with greater than 50%  respiratory variability, suggesting right atrial pressure of 3 mmHg.  Echocardiogram 02/2021: 1. Left ventricular ejection fraction, by estimation, is 60 to 65%. The  left ventricle has normal function. The left ventricle has no regional  wall motion abnormalities. There is moderate left ventricular hypertrophy.  Left ventricular diastolic  parameters are indeterminate.   2. Right ventricular systolic function is normal. The right ventricular  size is mildly enlarged. There is moderately elevated pulmonary artery  systolic pressure. The estimated right ventricular systolic pressure is  78.4 mmHg.   3. Left atrial size was severely dilated.   4. Right atrial size was severely dilated.   5. The mitral valve is normal in structure. Trivial mitral valve  regurgitation.   6. The aortic valve has been repaired/replaced. Aortic valve  regurgitation is not visualized. There is a 26 mm Edwards Sapien  prosthetic (TAVR) valve present in the aortic position. Procedure Date:  12/14/16. Echo findings are consistent with normal  structure and function of the aortic valve prosthesis. MG 82mmHg, stable  from prior echo 08/22/19   Comparison(s): 08/22/19 EF 60-65%. AV 89mmHg mean PG, 52mmHg peak PG.   Laboratory Data:  High Sensitivity Troponin:   Recent Labs  Lab  08/04/21 2115 08/05/21 0039 08/05/21 1146  TROPONINIHS 53* 72* 67*     Chemistry Recent Labs  Lab 08/08/21 0115 08/09/21 0204 08/10/21 0109  NA 140 139 138  K 3.5 3.4* 3.4*  CL 111 107 105  CO2 20* 21* 21*  GLUCOSE 93 87 82  BUN 19 15 12   CREATININE 1.00 0.99 0.99  CALCIUM 8.6* 8.8* 8.5*  MG 1.8 1.9 1.6*  GFRNONAA 56* 57* 57*  ANIONGAP 9 11 12     Recent Labs  Lab 08/04/21 2115 08/05/21 0646  PROT 6.5 5.9*  ALBUMIN 3.0* 2.5*  AST 25 20  ALT 18 15  ALKPHOS 70 61  BILITOT 0.9 1.1   Lipids No results for input(s): CHOL, TRIG, HDL, LABVLDL, LDLCALC, CHOLHDL in the last 168 hours.  Hematology Recent Labs  Lab 08/08/21 0115 08/09/21 0204 08/10/21 0109  WBC 9.3 11.8* 11.1*  RBC 2.82* 3.41* 3.15*  HGB 8.6* 10.3* 9.6*  HCT 26.4* 32.5* 29.8*  MCV 93.6 95.3 94.6  MCH 30.5 30.2 30.5  MCHC 32.6 31.7 32.2  RDW 14.8 14.9 15.1  PLT 175 207 209   Thyroid No results for  input(s): TSH, FREET4 in the last 168 hours.  BNP Recent Labs  Lab 08/04/21 2115  BNP 474.6*    DDimer No results for input(s): DDIMER in the last 168 hours.   Radiology/Studies:  No results found.   Assessment and Plan:   1. Endocarditis of TAVR valve: patient presented with sepsis in the setting of enterococcus faecalis bacteremia and atypical PNA. TTE with EF 55-60% increased TAVR valve mean gradient. She underwent TEE today which showed endocarditis of TAVR valve. ID is following - she is on IV ampicillin and IV ceftriaxone - Recommend ongoing conservative management for endocarditis  - Continue IV antibiotics per ID - Will need close monitoring - consider repeat TEE +/- Cardiac CT in 6 weeks to evaluate for resolution prior to stopping antibiotics.   2. Non-obstructive CAD: no recent anginal complaints. Not on aspirin due to need for anticoagulation - Continue statin  3. Chronic diastolic CHF/pulmonary hypertension: EF 55-60% on echo this admission. Initially presented with SOB in the  setting of atypical PNA. Recommended to undergo outpatient PFT's back in May but has not completed this yet. She has continued on po lasix 20mg  daily and maintained euvolemic status - Continue to monitor volume status closely with daily weight and strict I&Os   4. Chronic atrial fibrillation: rates well controlled on home diltiazem - Continue diltiazem for rate control - Continue xarelto for stroke ppx  5. HTN: BP stable - Continue diltiazem, losartan, and lasix  6. HLD: LDL 55 01/2021; at goal of <70 - continue atorvastatin  7. OSA: on CPAP - Continue CPAP use at discharge       Risk Assessment/Risk Scores:   CHA2DS2-VASc Score = 7  This indicates a 11.2% annual risk of stroke. The patient's score is based upon: CHF History: 1 HTN History: 1 Diabetes History: 1 Stroke History: 0 Vascular Disease History: 1 Age Score: 2 Gender Score: 1          For questions or updates, please contact [place] Please consult [email or web address] for contact info under    Signed, Abigail Butts, PA-C  08/10/2021 4:05 PM    Personally seen and examined. Agree with APP above with the following comments: Briefly 85 yo frail woman with permanent AF, TAVR in 2018, hx of UTI and PNA who presented with bacteremia and found to have endocarditis. Patient notes that she has had improvement in her fevers, chill, and rigors with antibiotic therapy.  Has no HF sx but her parents had in the past and died from CHF Exam notable for systolic crescendo without diastolic murmur.   Labs notable for two sets of clearing cultures 10/20 and 10/23 Personally reviewed relevant tests, TEE does not show abscess or aortic involvement Would recommend  - six weeks antibiotics per ID - No early surgical criteria (HF or valve dysfunction, fungi, heart block, or abscess, no embolic disease and vegetation < 1 cm) - Will get Cardiac CT planning on going back to [place] and presence of abscess may greatly change  her care (I'm unclear if she would be a surgical candidate; she notes she does her own washing and uses a walker, has frail appearance)  Rudean Haskell, MD [facility]  [address] [place], [place] [address] [phone]  6:07 PM

## 2021-08-10 NOTE — Anesthesia Postprocedure Evaluation (Signed)
Anesthesia Post Note  Patient: [name]  Procedure(s) Performed: TRANSESOPHAGEAL ECHOCARDIOGRAM (TEE)     Patient location during evaluation: PACU Anesthesia Type: MAC Level of consciousness: awake and alert Pain management: pain level controlled Vital Signs Assessment: post-procedure vital signs reviewed and stable Respiratory status: spontaneous breathing, nonlabored ventilation, respiratory function stable and patient connected to nasal cannula oxygen Cardiovascular status: stable and blood pressure returned to baseline Postop Assessment: no apparent nausea or vomiting Anesthetic complications: no   No notable events documented.  Last Vitals:  Vitals:   08/10/21 0930 08/10/21 0946  BP: (!) 163/96 (!) 153/85  Pulse: 86 (!) 54  Resp: 15 16  Temp:  36.4 C  SpO2: 100% 92%    Last Pain:  Vitals:   08/10/21 1013  TempSrc:   PainSc: 0-No pain                 Malak Orantes L Aolani Piggott

## 2021-08-10 NOTE — Progress Notes (Signed)
Mobility Specialist Progress Note:   08/10/21 1013  Mobility  Activity Ambulated in hall  Level of Assistance Standby assist, set-up cues, supervision of patient - no hands on  Assistive Device Front wheel walker  Distance Ambulated (ft) 80 ft  Mobility Ambulated with assistance in hallway  Mobility Response Tolerated well  Mobility performed by Mobility specialist  Bed Position Chair  $Mobility charge 1 Mobility   Pt received in chair requesting to use bathroom before ambulation. No complaints of pain during ambulation. Pt. Returned to chair with call bell in reach and all needs met.   [facility] Health and safety inspector Phone [phone]

## 2021-08-10 NOTE — Progress Notes (Signed)
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 Dr. Angelena Form has been contacted for further evaluation. Will need 6 weeks of antibiotic therapy with ampicillin and ceftriaxone. Discussed the treatment plan and need for PICC line. May be returning to [place] in 4 weeks and husband is going to contact providers. Blood cultures from 08/09/21 remain without growth to date and okay for PICC placement.  Will await Cardiology evaluation. Continue current dose of ampicillin and ceftriaxone.  PLAN:  Continue ampicillin and ceftriaxone PICC line placement prior to discharge.  Awaiting evaluation by Dr. Angelena Form. 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 TEE positive for prosthetic valve endocarditis. Husband at beside and has questions about next step. Planning to return to [place] in the next 4 weeks and considering staying through treatment.   Allergies  Allergen Reactions   Beta Adrenergic Blockers Shortness Of Breath   Other     Narcotic pain medications - anxiety attacks   Oxycodone Shortness Of Breath and Other (See  Comments)    Tolerates Dilaudid, Fentanyl   Sotalol Hcl Shortness Of Breath and Other (See Comments)   Tramadol Anxiety and Other (See Comments)    Can take if absolutely necessary   Clonidine Derivatives Palpitations    bradycardia   Crestor [Rosuvastatin Calcium] Other (See Comments)    MYALGIAS   Latex Rash    Per patient-bandaids- no problems with gloves   Rosuvastatin Calcium Nausea Only   Rosuvastatin    Tape Rash     bandaids      Review of Systems: Review of Systems  Constitutional:  Negative for chills, fever and weight loss.  Respiratory:  Negative for cough, shortness of breath and wheezing.   Cardiovascular:  Negative for chest pain and leg swelling.  Gastrointestinal:  Negative for abdominal pain, constipation, diarrhea, nausea and vomiting.  Skin:  Negative for rash.     OBJECTIVE: Vitals:   08/10/21 0807 08/10/21 0915 08/10/21 0930 08/10/21 0946  BP: (!) 196/98 (!) 153/83 (!) 163/96 (!) 153/85  Pulse: 92 88 86 (!) 54  Resp: 11 16 15 16   Temp:  98.2 F (36.8 C)  97.6 F (36.4 C)  TempSrc:    Oral  SpO2: 100% 98% 100% 92%  Weight:      Height:       Body mass index is 29.04 kg/m.  Physical Exam Constitutional:      General: She is not in acute distress.    Appearance:  She is well-developed.  Cardiovascular:     Rate and Rhythm: Normal rate and regular rhythm.     Heart sounds: Normal heart sounds.  Pulmonary:     Effort: Pulmonary effort is normal.     Breath sounds: Normal breath sounds.  Skin:    General: Skin is warm and dry.  Neurological:     Mental Status: She is alert and oriented to person, place, and time.  Psychiatric:        Behavior: Behavior normal.        Thought Content: Thought content normal.        Judgment: Judgment normal.    Lab Results Lab Results  Component Value Date   WBC 11.1 (H) 08/10/2021   HGB 9.6 (L) 08/10/2021   HCT 29.8 (L) 08/10/2021   MCV 94.6 08/10/2021   PLT 209 08/10/2021    Lab Results   Component Value Date   CREATININE 0.99 08/10/2021   BUN 12 08/10/2021   NA 138 08/10/2021   K 3.4 (L) 08/10/2021   CL 105 08/10/2021   CO2 21 (L) 08/10/2021    Lab Results  Component Value Date   ALT 15 08/05/2021   AST 20 08/05/2021   ALKPHOS 61 08/05/2021   BILITOT 1.1 08/05/2021     Microbiology: Recent Results (from the past 240 hour(s))  Resp Panel by RT-PCR (Flu A&B, Covid) Nasopharyngeal Swab     Status: None   Collection Time: 08/04/21  9:38 PM   Specimen: Nasopharyngeal Swab; Nasopharyngeal(NP) swabs in vial transport medium  Result Value Ref Range Status   SARS Coronavirus 2 by RT PCR NEGATIVE NEGATIVE Final    Comment: (NOTE) SARS-CoV-2 target nucleic acids are NOT DETECTED.  The SARS-CoV-2 RNA is generally detectable in upper respiratory specimens during the acute phase of infection. The lowest concentration of SARS-CoV-2 viral copies this assay can detect is 138 copies/mL. A negative result does not preclude SARS-Cov-2 infection and should not be used as the sole basis for treatment or other patient management decisions. A negative result may occur with  improper specimen collection/handling, submission of specimen other than nasopharyngeal swab, presence of viral mutation(s) within the areas targeted by this assay, and inadequate number of viral copies(<138 copies/mL). A negative result must be combined with clinical observations, patient history, and epidemiological information. The expected result is Negative.  Fact Sheet for Patients:  EntrepreneurPulse.com.au  Fact Sheet for Healthcare Providers:  IncredibleEmployment.be  This test is no t yet approved or cleared by the [place] FDA and  has been authorized for detection and/or diagnosis of SARS-CoV-2 by FDA under an Emergency Use Authorization (EUA). This EUA will remain  in effect (meaning this test can be used) for the duration of the COVID-19 declaration  under Section 564(b)(1) of the Act, 21 U.S.C.section 360bbb-3(b)(1), unless the authorization is terminated  or revoked sooner.       Influenza A by PCR NEGATIVE NEGATIVE Final   Influenza B by PCR NEGATIVE NEGATIVE Final    Comment: (NOTE) The Xpert Xpress SARS-CoV-2/FLU/RSV plus assay is intended as an aid in the diagnosis of influenza from Nasopharyngeal swab specimens and should not be used as a sole basis for treatment. Nasal washings and aspirates are unacceptable for Xpert Xpress SARS-CoV-2/FLU/RSV testing.  Fact Sheet for Patients: EntrepreneurPulse.com.au  Fact Sheet for Healthcare Providers: IncredibleEmployment.be  This test is not yet approved or cleared by the [place] FDA and has been authorized for detection and/or diagnosis of SARS-CoV-2 by FDA  under an Emergency Use Authorization (EUA). This EUA will remain in effect (meaning this test can be used) for the duration of the COVID-19 declaration under Section 564(b)(1) of the Act, 21 U.S.C. section 360bbb-3(b)(1), unless the authorization is terminated or revoked.  Performed at [facility], [address] [place], [place] [address]   Blood culture (routine x 2)     Status: Abnormal   Collection Time: 08/04/21  9:50 PM   Specimen: BLOOD RIGHT FOREARM  Result Value Ref Range Status   Specimen Description   Final    BLOOD RIGHT FOREARM Performed at [facility], [address] [place], [place] [address]    Special Requests   Final    BOTTLES DRAWN AEROBIC AND ANAEROBIC Blood Culture adequate volume Performed at [facility], [address] [place], [place] [address]    Culture  Setup Time   Final    GRAM POSITIVE COCCI IN CHAINS IN BOTH AEROBIC AND ANAEROBIC BOTTLES CRITICAL VALUE NOTED.  VALUE IS CONSISTENT WITH PREVIOUSLY REPORTED AND CALLED VALUE.    Culture (A)  Final    ENTEROCOCCUS FAECALIS SUSCEPTIBILITIES PERFORMED ON  PREVIOUS CULTURE WITHIN THE LAST 5 DAYS. Performed at [facility], [place] [address], [place], [place] [address]    Report Status 08/07/2021 FINAL  Final  Blood culture (routine x 2)     Status: Abnormal   Collection Time: 08/04/21 10:05 PM   Specimen: BLOOD RIGHT WRIST  Result Value Ref Range Status   Specimen Description   Final    BLOOD RIGHT WRIST Performed at [facility], [address] [place], [place] [address]    Special Requests   Final    BOTTLES DRAWN AEROBIC AND ANAEROBIC Blood Culture adequate volume Performed at [facility], [address] [place], [place] [address]    Culture  Setup Time   Final    GRAM POSITIVE COCCI IN CHAINS IN BOTH AEROBIC AND ANAEROBIC BOTTLES CRITICAL RESULT CALLED TO, READ BACK BY AND VERIFIED WITH: PHARMD MICAHEL BITONTI 08/05/21@21 :12 BY TW Performed at [facility], [place] [address] [place], [place] [address]    Culture ENTEROCOCCUS FAECALIS (A)  Final   Report Status 08/07/2021 FINAL  Final   Organism ID, Bacteria ENTEROCOCCUS FAECALIS  Final      Susceptibility   Enterococcus faecalis - MIC*    AMPICILLIN <=2 SENSITIVE Sensitive     VANCOMYCIN 1 SENSITIVE Sensitive     GENTAMICIN SYNERGY SENSITIVE Sensitive     * ENTEROCOCCUS FAECALIS  Blood Culture ID Panel (Reflexed)     Status: Abnormal   Collection Time: 08/04/21 10:05 PM  Result Value Ref Range Status   Enterococcus faecalis DETECTED (A) NOT DETECTED Final    Comment: CRITICAL RESULT CALLED TO, READ BACK BY AND VERIFIED WITH: PHARMD MICAHEL BITONTI 08/05/21@21 :12 BY TW    Enterococcus Faecium NOT DETECTED NOT DETECTED Final   Listeria monocytogenes NOT DETECTED NOT DETECTED Final   Staphylococcus species NOT DETECTED NOT DETECTED Final   Staphylococcus aureus (BCID) NOT DETECTED NOT DETECTED Final   Staphylococcus epidermidis NOT DETECTED NOT DETECTED Final   Staphylococcus lugdunensis NOT DETECTED NOT DETECTED Final   Streptococcus  species NOT DETECTED NOT DETECTED Final   Streptococcus agalactiae NOT DETECTED NOT DETECTED Final   Streptococcus pneumoniae NOT DETECTED NOT DETECTED Final   Streptococcus pyogenes NOT DETECTED NOT DETECTED Final   A.calcoaceticus-baumannii NOT DETECTED NOT DETECTED  Final   Bacteroides fragilis NOT DETECTED NOT DETECTED Final   Enterobacterales NOT DETECTED NOT DETECTED Final   Enterobacter cloacae complex NOT DETECTED NOT DETECTED Final   Escherichia coli NOT DETECTED NOT DETECTED Final   Klebsiella aerogenes NOT DETECTED NOT DETECTED Final   Klebsiella oxytoca NOT DETECTED NOT DETECTED Final   Klebsiella pneumoniae NOT DETECTED NOT DETECTED Final   Proteus species NOT DETECTED NOT DETECTED Final   Salmonella species NOT DETECTED NOT DETECTED Final   Serratia marcescens NOT DETECTED NOT DETECTED Final   Haemophilus influenzae NOT DETECTED NOT DETECTED Final   Neisseria meningitidis NOT DETECTED NOT DETECTED Final   Pseudomonas aeruginosa NOT DETECTED NOT DETECTED Final   Stenotrophomonas maltophilia NOT DETECTED NOT DETECTED Final   Candida albicans NOT DETECTED NOT DETECTED Final   Candida auris NOT DETECTED NOT DETECTED Final   Candida glabrata NOT DETECTED NOT DETECTED Final   Candida krusei NOT DETECTED NOT DETECTED Final   Candida parapsilosis NOT DETECTED NOT DETECTED Final   Candida tropicalis NOT DETECTED NOT DETECTED Final   Cryptococcus neoformans/gattii NOT DETECTED NOT DETECTED Final   Vancomycin resistance NOT DETECTED NOT DETECTED Final    Comment: Performed at [facility], [place] [address], [place], [place] [address]  Culture, blood (routine x 2)     Status: None (Preliminary result)   Collection Time: 08/06/21  8:46 AM   Specimen: BLOOD  Result Value Ref Range Status   Specimen Description BLOOD RIGHT ANTECUBITAL  Final   Special Requests   Final    BOTTLES DRAWN AEROBIC AND ANAEROBIC Blood Culture adequate volume   Culture   Final    NO GROWTH 4  DAYS Performed at [facility], [place] [address] [place], [place] [address]    Report Status PENDING  Incomplete  Culture, blood (routine x 2)     Status: None (Preliminary result)   Collection Time: 08/06/21  8:52 AM   Specimen: BLOOD RIGHT HAND  Result Value Ref Range Status   Specimen Description BLOOD RIGHT HAND  Final   Special Requests   Final    BOTTLES DRAWN AEROBIC ONLY Blood Culture adequate volume   Culture   Final    NO GROWTH 4 DAYS Performed at [facility], [place] [address], [place], [place] [address]    Report Status PENDING  Incomplete  Surgical pcr screen     Status: None   Collection Time: 08/09/21  9:06 PM   Specimen: Nasal Mucosa; Nasal Swab  Result Value Ref Range Status   MRSA, PCR NEGATIVE NEGATIVE Final   Staphylococcus aureus NEGATIVE NEGATIVE Final    Comment: (NOTE) The Xpert SA Assay (FDA approved for NASAL specimens in patients 30 years of age and older), is one component of a comprehensive surveillance program. It is not intended to diagnose infection nor to guide or monitor treatment. Performed at [facility], [place] [address] [place], [place] [address]      Terri Piedra, [place] for Infectious Disease [facility]  08/10/2021  3:53 PM

## 2021-08-10 NOTE — Transfer of Care (Signed)
Immediate Anesthesia Transfer of Care Note  Patient: [name]  Procedure(s) Performed: TRANSESOPHAGEAL ECHOCARDIOGRAM (TEE)  Patient Location: PACU  Anesthesia Type:MAC  Level of Consciousness: awake, alert  and oriented  Airway & Oxygen Therapy: Patient Spontanous Breathing and Patient connected to nasal cannula oxygen  Post-op Assessment: Report given to RN, Post -op Vital signs reviewed and stable and Patient moving all extremities  Post vital signs: Reviewed and stable  Last Vitals:  Vitals Value Taken Time  BP 153/83 08/10/21 0915  Temp    Pulse 87 08/10/21 0916  Resp 25 08/10/21 0916  SpO2 94 % 08/10/21 0916  Vitals shown include unvalidated device data.  Last Pain:  Vitals:   08/10/21 0803  TempSrc: Temporal  PainSc: 0-No pain         Complications: No notable events documented.

## 2021-08-10 NOTE — Progress Notes (Signed)
  Echocardiogram Echocardiogram Transesophageal has been performed.  [name] 08/10/2021, 9:26 AM

## 2021-08-11 ENCOUNTER — Inpatient Hospital Stay: Payer: Self-pay

## 2021-08-11 ENCOUNTER — Inpatient Hospital Stay (HOSPITAL_COMMUNITY): Payer: Medicare Other

## 2021-08-11 ENCOUNTER — Encounter (HOSPITAL_COMMUNITY): Payer: Self-pay | Admitting: Cardiology

## 2021-08-11 DIAGNOSIS — I700 Atherosclerosis of aorta: Secondary | ICD-10-CM

## 2021-08-11 DIAGNOSIS — J189 Pneumonia, unspecified organism: Secondary | ICD-10-CM

## 2021-08-11 DIAGNOSIS — J9601 Acute respiratory failure with hypoxia: Secondary | ICD-10-CM | POA: Diagnosis not present

## 2021-08-11 DIAGNOSIS — R7881 Bacteremia: Secondary | ICD-10-CM | POA: Diagnosis not present

## 2021-08-11 DIAGNOSIS — B952 Enterococcus as the cause of diseases classified elsewhere: Secondary | ICD-10-CM | POA: Diagnosis not present

## 2021-08-11 LAB — CULTURE, BLOOD (ROUTINE X 2)
Culture: NO GROWTH
Culture: NO GROWTH
Special Requests: ADEQUATE
Special Requests: ADEQUATE

## 2021-08-11 MED ORDER — CHLORHEXIDINE GLUCONATE CLOTH 2 % EX PADS
6.0000 | MEDICATED_PAD | Freq: Every day | CUTANEOUS | Status: DC
  Administered 2021-08-12: 6 via TOPICAL

## 2021-08-11 MED ORDER — IOHEXOL 350 MG/ML SOLN
80.0000 mL | Freq: Once | INTRAVENOUS | Status: AC | PRN
  Administered 2021-08-11: 80 mL via INTRAVENOUS

## 2021-08-11 MED ORDER — SODIUM CHLORIDE 0.9 % IV SOLN: 2.0000 g | Freq: Two times a day (BID) | INTRAVENOUS | Status: DC

## 2021-08-11 MED ORDER — DILTIAZEM HCL 25 MG/5ML IV SOLN
INTRAVENOUS | Status: AC
  Administered 2021-08-11: 5 mg
  Filled 2021-08-11: qty 5

## 2021-08-11 MED ORDER — SODIUM CHLORIDE 0.9% FLUSH
10.0000 mL | Freq: Two times a day (BID) | INTRAVENOUS | Status: DC
  Administered 2021-08-11: 10 mL

## 2021-08-11 MED ORDER — SODIUM CHLORIDE 0.9% FLUSH: 10.0000 mL | INTRAVENOUS | Status: DC | PRN

## 2021-08-11 MED ORDER — CEFTRIAXONE IV (FOR PTA / DISCHARGE USE ONLY): 2.0000 g | Freq: Two times a day (BID) | INTRAVENOUS | 0 refills | Status: AC

## 2021-08-11 MED ORDER — AMPICILLIN IV (FOR PTA / DISCHARGE USE ONLY): 8.0000 mg | INTRAVENOUS | 0 refills | Status: AC

## 2021-08-11 MED ORDER — DILTIAZEM HCL 60 MG PO TABS
90.0000 mg | ORAL_TABLET | Freq: Once | ORAL | Status: AC
  Administered 2021-08-11: 90 mg via ORAL
  Filled 2021-08-11: qty 2

## 2021-08-11 MED ORDER — SODIUM CHLORIDE 0.9 % IV SOLN: 2.0000 g | Freq: Four times a day (QID) | INTRAVENOUS | Status: DC

## 2021-08-11 NOTE — Progress Notes (Signed)
Mobility Specialist Progress Note:   08/11/21 0951  Mobility  Activity Ambulated in hall  Level of Assistance Standby assist, set-up cues, supervision of patient - no hands on  Assistive Device Front wheel walker  Distance Ambulated (ft) 220 ft  Mobility Ambulated with assistance in hallway  Mobility Response Tolerated well  Mobility performed by Mobility specialist  Bed Position Chair  $Mobility charge 1 Mobility   Pt received in chair willing to participate in mobility. No complaints of pain and asymptomatic throughout ambulation. Pt returned to chair with call bell in reach and all needs met.   [facility] Health and safety inspector Phone [phone]

## 2021-08-11 NOTE — TOC Progression Note (Signed)
Transition of Care [facility]) - Progression Note    Patient Details  Name: [name] MRN: [id number] Date of Birth: 04-Nov-1935  [facility] CM/SW Contact  Jacalyn Lefevre Edson [place], RN Phone Number: 08/11/2021, 3:15 PM  Clinical Narrative:     NCM was unaware patient was being discharged today. [facility] cannot have home IV ABX ready for discharge today. MD and nurse aware.     Expected Discharge Plan: [place]    Expected Discharge Plan and Services Expected Discharge Plan: [place]   Discharge Planning Services: CM Consult Post Acute Care Choice: [place] arrangements for the past 2 months: Single Family Home Expected Discharge Date: 08/11/21                 DME Agency: NA       HH Arranged: RN [place] Agency: [place] ([place]) Date [place]: 08/10/21 Time [place]: 5749 Representative spoke with at [place]: [place] Determinants of Health ([place]) Interventions    Readmission Risk Interventions No flowsheet data found.

## 2021-08-11 NOTE — Significant Event (Signed)
Patient had a coronary CT today. She has a very large vegetation.  The duration of antibiotics may get longer.  ID and cardiology to weigh in. CM working to arrange home health for IV antibiotics.

## 2021-08-11 NOTE — Progress Notes (Signed)
[facility] for Infectious Disease  Date of Admission:  08/04/2021     Total days of antibiotics 8         ASSESSMENT:  [name] blood cultures from 08/06/21 have finalized without growth to date. CT coronary with no evidence of abscess. Plan for 6 weeks of IV antibiotics with ampicillin and ceftriaxone. Will need PICC placed prior to discharge. Home Health working on getting antibiotics available for travel back to [place]. Home Health/OPAT orders below. Remaining and supportive care per Primary Team.   PLAN:  Continue ampicillin and ceftriaxone through 09/16/21. PICC line with double lumen prior to discharge. Home Health/OPAT orders below. Home Health working to accommodate travel plans back to [place].  Remaining medical and supportive care per primary team [facility] for discharge from ID standpoint.    Diagnosis:  Enterococcal endocarditis   Culture Result: Enterococcus faecalis   Allergies  Allergen Reactions   Beta Adrenergic Blockers Shortness Of Breath   Other     Narcotic pain medications - anxiety attacks   Oxycodone Shortness Of Breath and Other (See Comments)    Tolerates Dilaudid, Fentanyl   Sotalol Hcl Shortness Of Breath and Other (See Comments)   Tramadol Anxiety and Other (See Comments)    Can take if absolutely necessary   Clonidine Derivatives Palpitations    bradycardia   Crestor [Rosuvastatin Calcium] Other (See Comments)    MYALGIAS   Latex Rash    Per patient-bandaids- no problems with gloves   Rosuvastatin Calcium Nausea Only   Rosuvastatin    Tape Rash     bandaids     OPAT Orders Discharge antibiotics to be given via PICC line Discharge antibiotics: Per pharmacy protocol   Duration: 6 weeks   End Date: 09/16/21   [facility] Care Per Protocol:  Home health RN for IV administration and teaching; PICC line care and labs.    Labs weekly while on IV antibiotics: _X_ CBC with differential _X_ BMP __ CMP _X_ CRP _X_ ESR __  Vancomycin trough __ CK  __ Please pull PIC at completion of IV antibiotics _X_ Please leave PIC in place until doctor has seen patient or been notified  Fax weekly labs to [phone]  Clinic Follow Up Appt:  Follow up in [place] at end of treatment.    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 CT coronary completed with no evidence of abscess. Feeling okay. No fevers/chills.   Allergies  Allergen Reactions   Beta Adrenergic Blockers Shortness Of Breath   Other     Narcotic pain medications - anxiety attacks   Oxycodone Shortness Of Breath and Other (See Comments)  Tolerates Dilaudid, Fentanyl   Sotalol Hcl Shortness Of Breath and Other (See Comments)   Tramadol Anxiety and Other (See Comments)    Can take if absolutely necessary   Clonidine Derivatives Palpitations    bradycardia   Crestor [Rosuvastatin Calcium] Other (See Comments)    MYALGIAS   Latex Rash    Per patient-bandaids- no problems with gloves   Rosuvastatin Calcium Nausea Only   Rosuvastatin    Tape Rash     bandaids      Review of Systems: Review of Systems  Constitutional:  Negative for chills, fever and weight loss.  Respiratory:  Negative for cough, shortness of breath and wheezing.    Cardiovascular:  Negative for chest pain and leg swelling.  Gastrointestinal:  Negative for abdominal pain, constipation, diarrhea, nausea and vomiting.  Skin:  Negative for rash.     OBJECTIVE: Vitals:   08/11/21 0750 08/11/21 1049 08/11/21 1300 08/11/21 1305  BP: (!) 158/91 127/67 117/70 (!) 118/59  Pulse: 87 77 90 70  Resp: 18     Temp: 98.1 F (36.7 C)     TempSrc: Oral     SpO2: 97% 99%    Weight:      Height:       Body mass index is 29.04 kg/m.  Physical Exam Constitutional:      General: She is not in acute distress.    Appearance: She is well-developed.  Cardiovascular:     Rate and Rhythm: Normal rate and regular rhythm.     Heart sounds: Normal heart sounds.  Pulmonary:     Effort: Pulmonary effort is normal.     Breath sounds: Normal breath sounds.  Skin:    General: Skin is warm and dry.  Neurological:     Mental Status: She is alert and oriented to person, place, and time.  Psychiatric:        Behavior: Behavior normal.        Thought Content: Thought content normal.        Judgment: Judgment normal.    Lab Results Lab Results  Component Value Date   WBC 11.1 (H) 08/10/2021   HGB 9.6 (L) 08/10/2021   HCT 29.8 (L) 08/10/2021   MCV 94.6 08/10/2021   PLT 209 08/10/2021    Lab Results  Component Value Date   CREATININE 0.99 08/10/2021   BUN 12 08/10/2021   NA 138 08/10/2021   K 3.4 (L) 08/10/2021   CL 105 08/10/2021   CO2 21 (L) 08/10/2021    Lab Results  Component Value Date   ALT 15 08/05/2021   AST 20 08/05/2021   ALKPHOS 61 08/05/2021   BILITOT 1.1 08/05/2021     Microbiology: Recent Results (from the past 240 hour(s))  Resp Panel by RT-PCR (Flu A&B, Covid) Nasopharyngeal Swab     Status: None   Collection Time: 08/04/21  9:38 PM   Specimen: Nasopharyngeal Swab; Nasopharyngeal(NP) swabs in vial transport medium  Result Value Ref Range Status   SARS Coronavirus 2 by RT PCR NEGATIVE NEGATIVE Final    Comment: (NOTE) SARS-CoV-2  target nucleic acids are NOT DETECTED.  The SARS-CoV-2 RNA is generally detectable in upper respiratory specimens during the acute phase of infection. The lowest concentration of SARS-CoV-2 viral copies this assay can detect is 138 copies/mL. A negative result does not preclude SARS-Cov-2 infection and should not be used as the sole basis for treatment or other patient management decisions. A negative result may occur with  improper  specimen collection/handling, submission of specimen other than nasopharyngeal swab, presence of viral mutation(s) within the areas targeted by this assay, and inadequate number of viral copies(<138 copies/mL). A negative result must be combined with clinical observations, patient history, and epidemiological information. The expected result is Negative.  Fact Sheet for Patients:  EntrepreneurPulse.com.au  Fact Sheet for Healthcare Providers:  IncredibleEmployment.be  This test is no t yet approved or cleared by the [place] FDA and  has been authorized for detection and/or diagnosis of SARS-CoV-2 by FDA under an Emergency Use Authorization (EUA). This EUA will remain  in effect (meaning this test can be used) for the duration of the COVID-19 declaration under Section 564(b)(1) of the Act, 21 U.S.C.section 360bbb-3(b)(1), unless the authorization is terminated  or revoked sooner.       Influenza A by PCR NEGATIVE NEGATIVE Final   Influenza B by PCR NEGATIVE NEGATIVE Final    Comment: (NOTE) The Xpert Xpress SARS-CoV-2/FLU/RSV plus assay is intended as an aid in the diagnosis of influenza from Nasopharyngeal swab specimens and should not be used as a sole basis for treatment. Nasal washings and aspirates are unacceptable for Xpert Xpress SARS-CoV-2/FLU/RSV testing.  Fact Sheet for Patients: EntrepreneurPulse.com.au  Fact Sheet for Healthcare  Providers: IncredibleEmployment.be  This test is not yet approved or cleared by the [place] FDA and has been authorized for detection and/or diagnosis of SARS-CoV-2 by FDA under an Emergency Use Authorization (EUA). This EUA will remain in effect (meaning this test can be used) for the duration of the COVID-19 declaration under Section 564(b)(1) of the Act, 21 U.S.C. section 360bbb-3(b)(1), unless the authorization is terminated or revoked.  Performed at [facility], [address] [place], [place] [address]   Blood culture (routine x 2)     Status: Abnormal   Collection Time: 08/04/21  9:50 PM   Specimen: BLOOD RIGHT FOREARM  Result Value Ref Range Status   Specimen Description   Final    BLOOD RIGHT FOREARM Performed at [facility], [address] [place], [place] [address]    Special Requests   Final    BOTTLES DRAWN AEROBIC AND ANAEROBIC Blood Culture adequate volume Performed at [facility], [address] [place], [place] [address]    Culture  Setup Time   Final    GRAM POSITIVE COCCI IN CHAINS IN BOTH AEROBIC AND ANAEROBIC BOTTLES CRITICAL VALUE NOTED.  VALUE IS CONSISTENT WITH PREVIOUSLY REPORTED AND CALLED VALUE.    Culture (A)  Final    ENTEROCOCCUS FAECALIS SUSCEPTIBILITIES PERFORMED ON PREVIOUS CULTURE WITHIN THE LAST 5 DAYS. Performed at [facility], [place] [address] [place], [place] [address]    Report Status 08/07/2021 FINAL  Final  Blood culture (routine x 2)     Status: Abnormal   Collection Time: 08/04/21 10:05 PM   Specimen: BLOOD RIGHT WRIST  Result Value Ref Range Status   Specimen Description   Final    BLOOD RIGHT WRIST Performed at [facility], [address] [place], [place] [address]    Special Requests   Final    BOTTLES DRAWN AEROBIC AND ANAEROBIC Blood Culture adequate volume Performed at [facility], [address] [place], [place] [address]     Culture  Setup Time   Final    GRAM POSITIVE COCCI IN CHAINS IN BOTH AEROBIC AND ANAEROBIC BOTTLES CRITICAL RESULT CALLED TO, READ BACK BY AND VERIFIED  WITH: PHARMD MICAHEL BITONTI 08/05/21_0 :12 BY TW Performed at [facility], [place] [address], [place], [place] [address]    Culture ENTEROCOCCUS FAECALIS (A)  Final   Report Status 08/07/2021 FINAL  Final   Organism ID, Bacteria ENTEROCOCCUS FAECALIS  Final      Susceptibility   Enterococcus faecalis - MIC*    AMPICILLIN <=2 SENSITIVE Sensitive     VANCOMYCIN 1 SENSITIVE Sensitive     GENTAMICIN SYNERGY SENSITIVE Sensitive     * ENTEROCOCCUS FAECALIS  Blood Culture ID Panel (Reflexed)     Status: Abnormal   Collection Time: 08/04/21 10:05 PM  Result Value Ref Range Status   Enterococcus faecalis DETECTED (A) NOT DETECTED Final    Comment: CRITICAL RESULT CALLED TO, READ BACK BY AND VERIFIED WITH: PHARMD MICAHEL BITONTI 08/05/21_1 :12 BY TW    Enterococcus Faecium NOT DETECTED NOT DETECTED Final   Listeria monocytogenes NOT DETECTED NOT DETECTED Final   Staphylococcus species NOT DETECTED NOT DETECTED Final   Staphylococcus aureus (BCID) NOT DETECTED NOT DETECTED Final   Staphylococcus epidermidis NOT DETECTED NOT DETECTED Final   Staphylococcus lugdunensis NOT DETECTED NOT DETECTED Final   Streptococcus species NOT DETECTED NOT DETECTED Final   Streptococcus agalactiae NOT DETECTED NOT DETECTED Final   Streptococcus pneumoniae NOT DETECTED NOT DETECTED Final   Streptococcus pyogenes NOT DETECTED NOT DETECTED Final   A.calcoaceticus-baumannii NOT DETECTED NOT DETECTED Final   Bacteroides fragilis NOT DETECTED NOT DETECTED Final   Enterobacterales NOT DETECTED NOT DETECTED Final   Enterobacter cloacae complex NOT DETECTED NOT DETECTED Final   Escherichia coli NOT DETECTED NOT DETECTED Final   Klebsiella aerogenes NOT DETECTED NOT DETECTED Final   Klebsiella oxytoca NOT DETECTED NOT DETECTED Final   Klebsiella pneumoniae NOT  DETECTED NOT DETECTED Final   Proteus species NOT DETECTED NOT DETECTED Final   Salmonella species NOT DETECTED NOT DETECTED Final   Serratia marcescens NOT DETECTED NOT DETECTED Final   Haemophilus influenzae NOT DETECTED NOT DETECTED Final   Neisseria meningitidis NOT DETECTED NOT DETECTED Final   Pseudomonas aeruginosa NOT DETECTED NOT DETECTED Final   Stenotrophomonas maltophilia NOT DETECTED NOT DETECTED Final   Candida albicans NOT DETECTED NOT DETECTED Final   Candida auris NOT DETECTED NOT DETECTED Final   Candida glabrata NOT DETECTED NOT DETECTED Final   Candida krusei NOT DETECTED NOT DETECTED Final   Candida parapsilosis NOT DETECTED NOT DETECTED Final   Candida tropicalis NOT DETECTED NOT DETECTED Final   Cryptococcus neoformans/gattii NOT DETECTED NOT DETECTED Final   Vancomycin resistance NOT DETECTED NOT DETECTED Final    Comment: Performed at [facility], [place] [address] [place], [place] [address]  Culture, blood (routine x 2)     Status: None   Collection Time: 08/06/21  8:46 AM   Specimen: BLOOD  Result Value Ref Range Status   Specimen Description BLOOD RIGHT ANTECUBITAL  Final   Special Requests   Final    BOTTLES DRAWN AEROBIC AND ANAEROBIC Blood Culture adequate volume   Culture   Final    NO GROWTH 5 DAYS Performed at [facility], [place] [address], [place],  [address]    Report Status 08/11/2021 FINAL  Final  Culture, blood (routine x 2)     Status: None   Collection Time: 08/06/21  8:52 AM   Specimen: BLOOD RIGHT HAND  Result Value Ref Range Status   Specimen Description BLOOD RIGHT HAND  Final   Special Requests   Final  BOTTLES DRAWN AEROBIC ONLY Blood Culture adequate volume   Culture   Final    NO GROWTH 5 DAYS Performed at [facility], [place] [address] [place], [place] [address]    Report Status 08/11/2021 FINAL  Final  Surgical pcr screen     Status: None   Collection Time: 08/09/21  9:06 PM   Specimen: Nasal Mucosa;  Nasal Swab  Result Value Ref Range Status   MRSA, PCR NEGATIVE NEGATIVE Final   Staphylococcus aureus NEGATIVE NEGATIVE Final    Comment: (NOTE) The Xpert SA Assay (FDA approved for NASAL specimens in patients 31 years of age and older), is one component of a comprehensive surveillance program. It is not intended to diagnose infection nor to guide or monitor treatment. Performed at [facility], [place] [address], [place], [place] [address]      Terri Piedra, [place] for Infectious Disease [facility]  08/11/2021  2:07 PM

## 2021-08-11 NOTE — Progress Notes (Signed)
Progress Note  Patient Name: [name] Date of Encounter: 08/11/2021  Primary Cardiologist: Fransico Him, MD   Subjective   No events overnight.  Is planned for CT today. Has PIV.  Inpatient Medications    Scheduled Meds:  atorvastatin  80 mg Oral Daily   diltiazem  240 mg Oral q morning   furosemide  20 mg Oral Daily   losartan  100 mg Oral Daily   pantoprazole  40 mg Oral Daily   predniSONE  5 mg Oral Q breakfast   rivaroxaban  20 mg Oral Q supper   Continuous Infusions:  ampicillin (OMNIPEN) IV 2 g (08/11/21 0510)   cefTRIAXone (ROCEPHIN)  IV 2 g (08/11/21 0955)   PRN Meds: acetaminophen **OR** acetaminophen, albuterol, melatonin   Vital Signs    Vitals:   08/10/21 2032 08/11/21 0356 08/11/21 0750 08/11/21 1049  BP: 138/74 (!) 150/87 (!) 158/91 127/67  Pulse: (!) 109 (!) 106 87 77  Resp: 19 17 18    Temp: 98 F (36.7 C) 97.6 F (36.4 C) 98.1 F (36.7 C)   TempSrc: Oral Oral Oral   SpO2: 96% 92% 97% 99%  Weight:      Height:        Intake/Output Summary (Last 24 hours) at 08/11/2021 1134 Last data filed at 08/11/2021 0800 Gross per 24 hour  Intake 120 ml  Output --  Net 120 ml   Filed Weights   08/08/21 0535 08/09/21 0422 08/10/21 0803  Weight: 84.1 kg 84.1 kg 84.1 kg    Telemetry    None on tele - Personally Reviewed  ECG    No new - Personally Reviewed  Physical Exam   GEN: No acute distress.   Neck: No JVD Cardiac: IRIR, soft systolic murmur rubs, or gallops.  Respiratory: Clear to auscultation bilaterally. GI: Soft, nontender, non-distended  MS: No edema; No deformity. Neuro:  Nonfocal  Psych: Normal affect   Labs    Chemistry Recent Labs  Lab 08/04/21 2115 08/05/21 0646 08/06/21 0139 08/08/21 0115 08/09/21 0204 08/10/21 0109  NA 136 135   < > 140 139 138  K 3.4* 3.5   < > 3.5 3.4* 3.4*  CL 106 105   < > 111 107 105  CO2 18* 22   < > 20* 21* 21*  GLUCOSE 133* 137*   < > 93 87 82  BUN 36* 28*   < > 19 15 12    CREATININE 1.21* 1.00   < > 1.00 0.99 0.99  CALCIUM 9.0 8.7*   < > 8.6* 8.8* 8.5*  PROT 6.5 5.9*  --   --   --   --   ALBUMIN 3.0* 2.5*  --   --   --   --   AST 25 20  --   --   --   --   ALT 18 15  --   --   --   --   ALKPHOS 70 61  --   --   --   --   BILITOT 0.9 1.1  --   --   --   --   GFRNONAA 44* 56*   < > 56* 57* 57*  ANIONGAP 12 8   < > 9 11 12    < > = values in this interval not displayed.     Hematology Recent Labs  Lab 08/08/21 0115 08/09/21 0204 08/10/21 0109  WBC 9.3 11.8* 11.1*  RBC 2.82* 3.41* 3.15*  HGB 8.6*  10.3* 9.6*  HCT 26.4* 32.5* 29.8*  MCV 93.6 95.3 94.6  MCH 30.5 30.2 30.5  MCHC 32.6 31.7 32.2  RDW 14.8 14.9 15.1  PLT 175 207 209    Cardiac EnzymesNo results for input(s): TROPONINI in the last 168 hours. No results for input(s): TROPIPOC in the last 168 hours.   BNP Recent Labs  Lab 08/04/21 2115  BNP 474.6*     DDimer No results for input(s): DDIMER in the last 168 hours.   Radiology    ECHO TEE  Result Date: 08/10/2021    TRANSESOPHOGEAL ECHO REPORT   Patient Name:   [name] Date of Exam: 08/10/2021 Medical Rec #:  [id number]          Height:       67.0 in Accession #:    [id number]         Weight:       185.4 lb Date of Birth:  1936-08-17          BSA:          1.958 m Patient Age:    85 years           BP:           153/84 mmHg Patient Gender: F                  HR:           87 bpm. Exam Location:  Inpatient Procedure: Transesophageal Echo, 3D Echo, Color Doppler and Cardiac Doppler Indications:     Endocarditis  History:         Patient has prior history of Echocardiogram examinations, most                  recent 08/05/2021. CHF, CAD, TIA and Pulmonary HTN,                  Arrythmias:Atrial Fibrillation; Risk Factors:Dyslipidemia and                  Hypertension.                  Aortic Valve: 26 mm Edwards Sapien prosthetic, stented (TAVR)                  valve is present in the aortic position. Procedure Date:                   12/14/16.  Sonographer:     Bernadene Person RDCS Referring Phys:  [id number] Darreld Mclean Diagnosing Phys: Buford Dresser MD PROCEDURE: After discussion of the risks and benefits of a TEE, an informed consent was obtained from the patient. The transesophogeal probe was passed without difficulty through the esophogus of the patient. Local oropharyngeal anesthetic was provided with Cetacaine. Sedation performed by different physician. The patient was monitored while under deep sedation. Anesthestetic sedation was provided intravenously by Anesthesiology: 263.34mg  of Propofol. The patient's vital signs; including heart rate, blood pressure, and oxygen saturation; remained stable throughout the procedure. The patient developed no complications during the procedure. IMPRESSIONS  1. Left ventricular ejection fraction, by estimation, is 55 to 60%. The left ventricle has normal function. The left ventricle has no regional wall motion abnormalities.  2. Right ventricular systolic function is normal. The right ventricular size is normal.  3. Left atrial size was severely dilated. No left atrial/left atrial appendage thrombus was detected.  4. Right atrial size was severely dilated.  5. The  mitral valve is normal in structure. Mild to moderate mitral valve regurgitation.  6. Tricuspid valve regurgitation is mild to moderate.  7. There is a vegetation seen on the prosthetic aortic valve, 0.8 x 0.7 cm, predominantly on the ventricular side of the aortic valve but small filament also see on aortic side of the valve. The aortic valve has been repaired/replaced. Aortic valve regurgitation is trivial. There is a 26 mm Edwards Sapien prosthetic (TAVR) valve present in the aortic position. Procedure Date: 12/14/16.  8. There is Moderate (Grade III) plaque involving the descending aorta. Conclusion(s)/Recommendation(s): Positive study for endocarditis of prosthetic aortic valve. FINDINGS  Left Ventricle: Left ventricular  ejection fraction, by estimation, is 55 to 60%. The left ventricle has normal function. The left ventricle has no regional wall motion abnormalities. The left ventricular internal cavity size was normal in size. Right Ventricle: The right ventricular size is normal. No increase in right ventricular wall thickness. Right ventricular systolic function is normal. Left Atrium: Left atrial size was severely dilated. No left atrial/left atrial appendage thrombus was detected. Right Atrium: Right atrial size was severely dilated. Pericardium: Trivial pericardial effusion is present. Mitral Valve: The mitral valve is normal in structure. Mild to moderate mitral valve regurgitation. There is no evidence of mitral valve vegetation. Tricuspid Valve: The tricuspid valve is normal in structure. Tricuspid valve regurgitation is mild to moderate. No evidence of tricuspid stenosis. There is no evidence of tricuspid valve vegetation. Aortic Valve: There is a vegetation seen on the prosthetic aortic valve, 0.8 x 0.7 cm, predominantly on the ventricular side of the aortic valve but small filament also see on aortic side of the valve. The aortic valve has been repaired/replaced. Aortic valve regurgitation is trivial. There is a 26 mm Edwards Sapien prosthetic, stented (TAVR) valve present in the aortic position. Procedure Date: 12/14/16. Pulmonic Valve: The pulmonic valve was grossly normal. Pulmonic valve regurgitation is mild. No evidence of pulmonic stenosis. There is no evidence of pulmonic valve vegetation. Aorta: The aortic root and ascending aorta are structurally normal, with no evidence of dilitation. There is moderate (Grade III) plaque involving the descending aorta. IAS/Shunts: No atrial level shunt detected by color flow Doppler.  TRICUSPID VALVE TR Peak grad:   34.6 mmHg TR Vmax:        294.00 cm/s Buford Dresser MD Electronically signed by Buford Dresser MD Signature Date/Time: 08/10/2021/4:21:19 PM    Final     [place] EKG SITE RITE  Result Date: 08/11/2021 If Site Rite image not attached, placement could not be confirmed due to current cardiac rhythm.    Patient Profile     85 y.o. female with prosthetic valve endocarditis  Assessment & Plan    Endocarditis of TAVR valve:  -  valve is concerning; we are pursuing Cardiac CT - at a minimum will need 6 weeks ABx  Non-obstructive CAD:  - Continue statin, asymptomatic    Permanent atrial fibrillation:  - rates well controlled on home diltiazem - and Xarelto   HTN  - BP stable - Continue diltiazem, losartan, and lasix   HLD:  - LDL 55 01/2021; at goal of <70 - continue atorvastatin   OSA: on CPAP - Continue CPAP use at discharge      For questions or updates, please contact [facility] Please consult [email or web address] for contact info under Cardiology/STEMI.      Signed, Werner Lean, MD  08/11/2021, 11:34 AM

## 2021-08-11 NOTE — Progress Notes (Signed)
PHARMACY CONSULT NOTE FOR:  OUTPATIENT  PARENTERAL ANTIBIOTIC THERAPY (OPAT)  Indication: PV Endocarditis Regimen:  Ampicillin 8g IV q24h as a continuous infusion Ceftriaxone 2g IV q12h End date: 09/16/21  IV antibiotic discharge orders are pended. To discharging provider:  please sign these orders via discharge navigator,  Select New Orders & click on the button choice - Manage This Unsigned Work.     Thank you for allowing pharmacy to be a part of this patient's care.  Lestine Box, PharmD PGY2 Infectious Diseases Pharmacy Resident   Please check [email or web address] for unit-specific pharmacy phone numbers

## 2021-08-11 NOTE — Discharge Summary (Signed)
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 female with PMH significant for HTN, HLD, permanent A. fib on Xarelto, TIA, severe AS  s/p TAVR, chronic diastolic CHF, history of PE, pulmonary hypertension, GERD Patient presented to the ED on 10/18 with sudden onset of shaking chills at home with subjective fever.  Travel history significant for recent return from [place] about 3 weeks ago. In the ED, patient was hypoxic to 89% and required supplemental oxygen. CT angio chest did not show any evidence of pulm embolism but showed subtle scattered tree-in-bud nodular densities in the upper lobes bilaterally. Labs showed first troponin 53 and second at 72, WBC count 17.9, lactic acid 2.6, BNP 474, hemoglobin 9.8. EKG showed A. fib with normal rate, no ischemic changes.    Admitted to hospitalist service. See details as below  Subjective: Patient was seen and examined this morning.  Sitting up in chair.  Not in distress.  No new symptoms.  ID and cardiology follow-up appreciated. PICC line today  Hospital course Enterococcus faecalis bacteremia Prosthetic  aortic valve endocarditis -Blood culture sent on admission grew Enterococcus faecalis.  ID consult was obtained and patient was started on IV ampicillin.  Clinically improving. -10/24, patient underwent TEE which suggested prosthetic aortic valve endocarditis.  Patient had TAVR done in 2018 by Dr. Angelena Form.  ID and cardiology follow-up appreciated. -Plan is to continue IV ampicillin and IV Rocephin for 6 weeks.  To follow-up with ID and cardiology as an outpatient  Atypical pneumonia Sepsis - POA -In the setting of recent travel, patient presented with chills, hypoxia, leukocytosis and lactic acidosis and elevated procalcitonin level.  Chest imaging did not show any consolidation or symptoms highly suggestive of pneumonia. -On IV Rocephin. -No cough, fever. -WBC count improving.  Lactic acid level normalized, progressively improving.   Recent Labs  Lab 08/04/21 2150 08/05/21 0039 08/05/21 0646 08/06/21 0139 08/07/21 0133 08/08/21 0115 08/09/21 0204 08/10/21 0109  WBC  --   --  13.3* 11.1* 10.7* 9.3 11.8* 11.1*  LATICACIDVEN 2.6* 2.6* 1.4  --   --   --   --   --   PROCALCITON  --   --  3.83 3.30 2.51  --   --   --    Acute respiratory failure with hypoxia -Initially required supplemental oxygen.  Currently breathing on room air.  Encourage ambulation.Marland Kitchen  Permanent A. Fib -Continue Cardizem, Xarelto  History of TIA Hyperlipidemia -On Xarelto, Lipitor, Zetia  Essential hypertension -Currently on Cardizem, losartan and Lasix.  Patient reports that recently lisinopril was stopped by her PCP for cough. -Blood pressure overall controlled well.  Blood pressure was elevated this morning probably  prior to the procedure.  Continue to monitor.    Pulmonary hypertension -Follows up with cardiologist as an outpatient.  There was a plan to obtain PFT and DLCO as an outpatient.  Continue to follow-up as an outpatient.   Acute on chronic anemia -Patient has acute drop in hemoglobin from  normal baseline to between 9 and 10 in last 3 days.  No active bleeding.   Recent Labs    08/05/21 0646 08/06/21 0139 08/07/21 0133 08/08/21 0115 08/09/21 0204 08/10/21 0109  HGB 9.2* 9.5* 9.5* 8.6* 10.3* 9.6*  MCV 93.4 94.8 93.9 93.6 95.3 94.6  FERRITIN  --   --  81  --   --   --   TIBC 305  --   --   --   --   --   IRON 16*  --   --   --   --   --    GERD (gastroesophageal reflux disease) -Continue Protonix.  ?? History of rheumatoid arthritis -On prednisone 5 mg daily.   Allergies as of 08/11/2021       Reactions   Beta Adrenergic Blockers Shortness Of Breath   Other    Narcotic pain medications - anxiety attacks   Oxycodone Shortness Of Breath, Other (See Comments)   Tolerates Dilaudid, Fentanyl   Sotalol Hcl Shortness Of Breath, Other (See Comments)   Tramadol Anxiety, Other (See Comments)   Can take if absolutely necessary   Clonidine Derivatives Palpitations   bradycardia   Crestor [rosuvastatin Calcium] Other (See Comments)   MYALGIAS   Latex Rash   Per patient-bandaids- no problems with gloves   Rosuvastatin Calcium Nausea Only   Rosuvastatin    Tape Rash    bandaids         Medication List     STOP taking these medications    amoxicillin 500 MG capsule Commonly known as: AMOXIL   diltiazem 300 MG 24 hr capsule Commonly known as: Cardizem CD       TAKE these medications    alendronate 70 MG tablet Commonly known as: FOSAMAX Take 70 mg by mouth every Saturday.   ampicillin  IVPB Inject 8 mg into the vein daily. Administer as a continuous infusion Indication:  PV Endocarditis First Dose: Yes Last Day of Therapy:  09/16/21 Labs - Once weekly:  CBC/D and BMP, Labs - Every other week:  ESR and CRP Method of administration: Ambulatory Pump (Continuous Infusion) Method of administration may be changed at the discretion of home infusion pharmacist based upon assessment of the patient and/or caregiver's ability to self-administer the  medication ordered.   atorvastatin 80 MG tablet Commonly known as: LIPITOR Take 1 tablet (80 mg total) by mouth daily.   cefTRIAXone  IVPB Commonly known as: ROCEPHIN Inject 2 g into the vein every 12 (twelve) hours. Indication:  PV Endocarditis First Dose: Yes Last Day of Therapy:  09/16/21 Labs - Once weekly:  CBC/D and BMP, Labs - Every other week:  ESR and CRP Method of administration: IV Push Method of administration may be changed at the discretion of home infusion pharmacist based upon assessment of the patient and/or caregiver's ability to self-administer the medication ordered.   Dilt-XR 240 MG 24 hr capsule Generic drug: diltiazem Take 240 mg by mouth every morning.   ezetimibe 10 MG tablet Commonly known as: ZETIA Take 1 tablet (10 mg total) by mouth daily.   furosemide 20 MG tablet Commonly known as: LASIX Take 20 mg by mouth  daily.   hydrOXYzine 25 MG tablet Commonly known as: ATARAX/VISTARIL Take 25 mg by mouth every 12 (twelve) hours as needed for anxiety.   losartan 100 MG tablet Commonly known as: COZAAR Take 1 tablet (100 mg total) by mouth daily.   Melatonin 10 MG Tabs Take 10 mg by mouth at bedtime as needed (for sleep).   nitroGLYCERIN 0.4 MG SL tablet Commonly known as: NITROSTAT Place 0.4 mg under the tongue every 5 (five) minutes as needed for chest pain.   omeprazole 20 MG capsule Commonly known as: PRILOSEC Take 20 mg by mouth daily as needed (acid reflux/indigestion.).   predniSONE 5 MG tablet Commonly known as: DELTASONE Take 5 mg by mouth daily with breakfast.   rivaroxaban 20 MG Tabs tablet Commonly known as: XARELTO Take 20 mg by mouth daily with supper.   triamcinolone ointment 0.1 % Commonly known as: KENALOG Apply 1 application topically 2 (two) times daily as needed (affected areas of feet).   VITAMIN D BOOSTER PO Take 800 Units by mouth at bedtime. Kalcipos-d forte               Discharge Care Instructions   (From admission, onward)           Start     Ordered   08/11/21 0000  Change dressing on IV access line weekly and PRN  (Home infusion instructions - [facility] )        08/11/21 1504            Discharge Instructions:  Diet Recommendation:  Discharge Diet Orders (From admission, onward)     Start     Ordered   08/11/21 0000  Diet - low sodium heart healthy        08/11/21 1504            Follow ups:    Follow-up [place], [facility] Follow up.   Contact information: [address] [place] [address] [phone]         [facility] Follow up.   Why: [phone]        Maurice Small, MD Follow up.   Specialty: Family Medicine Contact information: [place] [address] [phone]         Sueanne Margarita, MD .   Specialty: Cardiology Contact information: [phone] [address] [place] [address] [phone]                 Wound care:     Discharge Exam:   Vitals:   08/11/21 0750 08/11/21 1049 08/11/21 1300 08/11/21 1305  BP: (!) 158/91 127/67 117/70 (!) 118/59  Pulse: 87 77 90 70  Resp: 18     Temp: 98.1 F (36.7 C)     TempSrc: Oral     SpO2: 97% 99%    Weight:      Height:        Body mass index is 29.04 kg/m.  General exam: Pleasant, not in distress.  Not on supplemental oxygen Skin: No rashes, lesions or ulcers. HEENT: Atraumatic, normocephalic, no obvious bleeding Lungs: Clear to auscultation bilaterally. CVS: Regular rate and rhythm, soft systolic murmur GI/Abd soft, nontender, nondistended, bowel sound present CNS: Alert, awake, oriented x3 Psychiatry: Mood appropriate Extremities: No pedal edema, no calf tenderness  Time coordinating discharge: 35 minutes   The results of significant diagnostics from this hospitalization (including imaging, microbiology, ancillary  and laboratory) are listed below for reference.     Procedures and Diagnostic Studies:   CT Angio Chest PE W and/or Wo Contrast  Result Date: 08/04/2021 CLINICAL DATA:  PE suspected, high prob Recently returned from [place], hypoxia, exertional shortness of breath, left leg pain, also rigors and some malaise EXAM: CT ANGIOGRAPHY CHEST WITH CONTRAST TECHNIQUE: Multidetector CT imaging of the chest was performed using the standard protocol during bolus administration of intravenous contrast. Multiplanar CT image reconstructions and MIPs were obtained to evaluate the vascular anatomy. CONTRAST:  73m OMNIPAQUE IOHEXOL 350 MG/ML SOLN COMPARISON:  11/29/2016 FINDINGS: Cardiovascular: Heart is enlarged. Prior TAVR. Multi-vessel coronary artery calcifications. Diffuse aortic calcifications. No aneurysm. No filling defects in the pulmonary arteries to suggest pulmonary emboli. Mediastinum/Nodes: No mediastinal, hilar, or axillary adenopathy. Trachea and esophagus are unremarkable. Thyroid unremarkable. Lungs/Pleura: No effusions. Tree-in-bud nodular densities seen in the upper lobes which could reflect small airways disease/alveolitis. No confluent airspace opacities. Upper Abdomen: Gallstones within the gallbladder. Musculoskeletal: Prior left breast implant. No acute bony abnormality. Review of the MIP images confirms the above findings. IMPRESSION: No evidence of pulmonary embolus. Subtle scattered tree-in-bud nodular densities in the upper lobes bilaterally may reflect small airways disease/alveolitis. No confluent areas of consolidation. Cardiomegaly, coronary artery disease. Cholelithiasis. Aortic Atherosclerosis (ICD10-I70.0). Electronically Signed   By: KRolm BaptiseM.D.   On: 08/04/2021 22:49   UKoreaVenous Img Lower Unilateral Left  Result Date: 08/04/2021 CLINICAL DATA:  Left leg pain EXAM: LEFT LOWER EXTREMITY VENOUS DOPPLER ULTRASOUND TECHNIQUE: Gray-scale sonography with compression, as well as color and duplex ultrasound, were performed to evaluate  the deep venous system(s) from the level of the common femoral vein through the popliteal and proximal calf veins. COMPARISON:  None. FINDINGS: VENOUS Normal compressibility of the common femoral, superficial femoral, and popliteal veins, as well as the visualized calf veins. Visualized portions of profunda femoral vein and great saphenous vein unremarkable. No filling defects to suggest DVT on grayscale or color Doppler imaging. Doppler waveforms show normal direction of venous flow, normal respiratory plasticity and response to augmentation. Limited views of the contralateral common femoral vein are unremarkable. OTHER None. Limitations: none IMPRESSION: Negative. Electronically Signed   By: KRolm BaptiseM.D.   On: 08/04/2021 23:36   ECHOCARDIOGRAM COMPLETE  Result Date: 08/05/2021    ECHOCARDIOGRAM REPORT   Patient Name:   LJANEIL SCHEXNAYDERDate of Exam: 08/05/2021 Medical Rec #:  [id number]         Height:       67.0 in Accession #:    [id number]        Weight:       187.6 lb Date of Birth:  311-23-1937         BSA:          1.968 m Patient Age:    89years           BP:           130/80 mmHg Patient Gender: F                  HR:           68 bpm. Exam Location:  Inpatient Procedure: 2D Echo, Cardiac Doppler and Color Doppler Indications:    SOB  History:        Patient has prior history of Echocardiogram examinations, most                 recent  02/26/2021. Risk Factors:Family History of Coronary Artery                 Disease and Hypertension.                 Aortic Valve: 26 mm Edwards Sapien prosthetic, stented (TAVR)                 valve is present in the aortic position.  Sonographer:    Helmut Muster Sonographer#2:  Helmut Muster Referring Phys: [place]  1. Left ventricular ejection fraction, by estimation, is 55 to 60%. The left ventricle has normal function. The left ventricle has no regional wall motion abnormalities. There is mild concentric left ventricular hypertrophy.  Left ventricular diastolic parameters are indeterminate.  2. Right ventricular systolic function is normal. The right ventricular size is normal.  3. Left atrial size was severely dilated.  4. Right atrial size was severely dilated.  5. The mitral valve is normal in structure. No evidence of mitral valve regurgitation. No evidence of mitral stenosis.  6. Tricuspid valve regurgitation is mild to moderate.  7. The aortic valve has been repaired/replaced. Aortic valve regurgitation is not visualized. No aortic stenosis is present. There is a 26 mm Edwards Sapien prosthetic (TAVR) valve present in the aortic position. Aortic valve mean gradient measures 19.4  mmHg. Aortic valve Vmax measures 2.78 m/s.  8. The inferior vena cava is normal in size with greater than 50% respiratory variability, suggesting right atrial pressure of 3 mmHg. FINDINGS  Left Ventricle: Left ventricular ejection fraction, by estimation, is 55 to 60%. The left ventricle has normal function. The left ventricle has no regional wall motion abnormalities. The left ventricular internal cavity size was normal in size. There is  mild concentric left ventricular hypertrophy. Left ventricular diastolic parameters are indeterminate. Right Ventricle: The right ventricular size is normal. No increase in right ventricular wall thickness. Right ventricular systolic function is normal. Left Atrium: Left atrial size was severely dilated. Right Atrium: Right atrial size was severely dilated. Pericardium: There is no evidence of pericardial effusion. Mitral Valve: The mitral valve is normal in structure. Mild mitral annular calcification. No evidence of mitral valve regurgitation. No evidence of mitral valve stenosis. Tricuspid Valve: The tricuspid valve is normal in structure. Tricuspid valve regurgitation is mild to moderate. No evidence of tricuspid stenosis. Aortic Valve: The aortic valve has been repaired/replaced. Aortic valve regurgitation is not visualized.  No aortic stenosis is present. Aortic valve mean gradient measures 19.4 mmHg. Aortic valve peak gradient measures 30.8 mmHg. Aortic valve area, by VTI measures 0.82 cm. There is a 26 mm Edwards Sapien prosthetic, stented (TAVR) valve present in the aortic position. Pulmonic Valve: The pulmonic valve was normal in structure. Pulmonic valve regurgitation is not visualized. No evidence of pulmonic stenosis. Aorta: The aortic root is normal in size and structure. Venous: The inferior vena cava is normal in size with greater than 50% respiratory variability, suggesting right atrial pressure of 3 mmHg. IAS/Shunts: No atrial level shunt detected by color flow Doppler.  LEFT VENTRICLE PLAX 2D LVIDd:         3.00 cm     Diastology LVIDs:         2.20 cm     LV e' medial:  13.70 cm/s LV PW:         1.40 cm     LV e' lateral: 20.90 cm/s LV IVS:        1.40 cm  LVOT diam:     1.80 cm LV SV:         51 LV SV Index:   26 LVOT Area:     2.54 cm  LV Volumes (MOD) LV vol d, MOD A2C: 58.1 ml LV vol d, MOD A4C: 62.9 ml LV vol s, MOD A2C: 22.3 ml LV vol s, MOD A4C: 27.8 ml LV SV MOD A2C:     35.8 ml LV SV MOD A4C:     62.9 ml LV SV MOD BP:      36.5 ml RIGHT VENTRICLE             IVC RV S prime:     15.30 cm/s  IVC diam: 2.40 cm TAPSE (M-mode): 2.0 cm LEFT ATRIUM              Index        RIGHT ATRIUM           Index LA diam:        5.40 cm  2.74 cm/m   RA Area:     28.10 cm LA Vol (A2C):   169.0 ml 85.87 ml/m  RA Volume:   90.20 ml  45.83 ml/m LA Vol (A4C):   147.0 ml 74.69 ml/m LA Biplane Vol: 160.0 ml 81.30 ml/m  AORTIC VALVE AV Area (Vmax):    0.98 cm AV Area (Vmean):   0.83 cm AV Area (VTI):     0.82 cm AV Vmax:           277.67 cm/s AV Vmean:          224.000 cm/s AV VTI:            0.620 m AV Peak Grad:      30.8 mmHg AV Mean Grad:      19.4 mmHg LVOT Vmax:         107.00 cm/s LVOT Vmean:        72.700 cm/s LVOT VTI:          0.199 m LVOT/AV VTI ratio: 0.32  AORTA Ao Root diam: 3.10 cm Ao Asc diam:  3.40 cm TRICUSPID  VALVE TR Peak grad:   50.1 mmHg TR Vmax:        354.00 cm/s  SHUNTS Systemic VTI:  0.20 m Systemic Diam: 1.80 cm Kardie Tobb DO Electronically signed by Berniece Salines DO Signature Date/Time: 08/05/2021/6:01:53 PM    Final      Labs:   Basic Metabolic Panel: Recent Labs  Lab 08/06/21 0139 08/07/21 0133 08/08/21 0115 08/09/21 0204 08/10/21 0109  NA 138 137 140 139 138  K 3.6 3.0* 3.5 3.4* 3.4*  CL 107 107 111 107 105  CO2 23 20* 20* 21* 21*  GLUCOSE 122* 130* 93 87 82  BUN _0 CREATININE 0.88 1.05* 1.00 0.99 0.99  CALCIUM 8.9 9.0 8.6* 8.8* 8.5*  MG 2.0 1.9 1.8 1.9 1.6*   GFR Estimated Creatinine Clearance: 48 mL/min (by C-G formula based on SCr of 0.99 mg/dL). Liver Function Tests: Recent Labs  Lab 08/04/21 2115 08/05/21 0646  AST 25 20  ALT 18 15  ALKPHOS 70 61  BILITOT 0.9 1.1  PROT 6.5 5.9*  ALBUMIN 3.0* 2.5*   No results for input(s): LIPASE, AMYLASE in the last 168 hours. No results for input(s): AMMONIA in the last 168 hours. Coagulation profile No results for input(s): INR, PROTIME in the last 168 hours.  CBC: Recent Labs  Lab 08/06/21 0139 08/07/21  0133 08/08/21 0115 08/09/21 0204 08/10/21 0109  WBC 11.1* 10.7* 9.3 11.8* 11.1*  NEUTROABS 9.8* 8.6* 6.9 8.9* 8.3*  HGB 9.5* 9.5* 8.6* 10.3* 9.6*  HCT 29.3* 29.2* 26.4* 32.5* 29.8*  MCV 94.8 93.9 93.6 95.3 94.6  PLT 149* 183 175 207 209   Cardiac Enzymes: No results for input(s): CKTOTAL, CKMB, CKMBINDEX, TROPONINI in the last 168 hours. BNP: Invalid input(s): POCBNP CBG: No results for input(s): GLUCAP in the last 168 hours. D-Dimer No results for input(s): DDIMER in the last 72 hours. Hgb A1c No results for input(s): HGBA1C in the last 72 hours. Lipid Profile No results for input(s): CHOL, HDL, LDLCALC, TRIG, CHOLHDL, LDLDIRECT in the last 72 hours. Thyroid function studies No results for input(s): TSH, T4TOTAL, T3FREE, THYROIDAB in the last 72 hours.  Invalid input(s):  FREET3 Anemia work up No results for input(s): VITAMINB12, FOLATE, FERRITIN, TIBC, IRON, RETICCTPCT in the last 72 hours. Microbiology Recent Results (from the past 240 hour(s))  Resp Panel by RT-PCR (Flu A&B, Covid) Nasopharyngeal Swab     Status: None   Collection Time: 08/04/21  9:38 PM   Specimen: Nasopharyngeal Swab; Nasopharyngeal(NP) swabs in vial transport medium  Result Value Ref Range Status   SARS Coronavirus 2 by RT PCR NEGATIVE NEGATIVE Final    Comment: (NOTE) SARS-CoV-2 target nucleic acids are NOT DETECTED.  The SARS-CoV-2 RNA is generally detectable in upper respiratory specimens during the acute phase of infection. The lowest concentration of SARS-CoV-2 viral copies this assay can detect is 138 copies/mL. A negative result does not preclude SARS-Cov-2 infection and should not be used as the sole basis for treatment or other patient management decisions. A negative result may occur with  improper specimen collection/handling, submission of specimen other than nasopharyngeal swab, presence of viral mutation(s) within the areas targeted by this assay, and inadequate number of viral copies(<138 copies/mL). A negative result must be combined with clinical observations, patient history, and epidemiological information. The expected result is Negative.  Fact Sheet for Patients:  EntrepreneurPulse.com.au  Fact Sheet for Healthcare Providers:  IncredibleEmployment.be  This test is no t yet approved or cleared by the [place] FDA and  has been authorized for detection and/or diagnosis of SARS-CoV-2 by FDA under an Emergency Use Authorization (EUA). This EUA will remain  in effect (meaning this test can be used) for the duration of the COVID-19 declaration under Section 564(b)(1) of the Act, 21 U.S.C.section 360bbb-3(b)(1), unless the authorization is terminated  or revoked sooner.       Influenza A by PCR NEGATIVE NEGATIVE  Final   Influenza B by PCR NEGATIVE NEGATIVE Final    Comment: (NOTE) The Xpert Xpress SARS-CoV-2/FLU/RSV plus assay is intended as an aid in the diagnosis of influenza from Nasopharyngeal swab specimens and should not be used as a sole basis for treatment. Nasal washings and aspirates are unacceptable for Xpert Xpress SARS-CoV-2/FLU/RSV testing.  Fact Sheet for Patients: EntrepreneurPulse.com.au  Fact Sheet for Healthcare Providers: IncredibleEmployment.be  This test is not yet approved or cleared by the [place] FDA and has been authorized for detection and/or diagnosis of SARS-CoV-2 by FDA under an Emergency Use Authorization (EUA). This EUA will remain in effect (meaning this test can be used) for the duration of the COVID-19 declaration under Section 564(b)(1) of the Act, 21 U.S.C. section 360bbb-3(b)(1), unless the authorization is terminated or revoked.  Performed at [facility], [address] [place], [place] [address]   Blood culture (routine x  2)     Status: Abnormal   Collection Time: 08/04/21  9:50 PM   Specimen: BLOOD RIGHT FOREARM  Result Value Ref Range Status   Specimen Description   Final    BLOOD RIGHT FOREARM Performed at [facility], [address] [place], [place] [address]    Special Requests   Final    BOTTLES DRAWN AEROBIC AND ANAEROBIC Blood Culture adequate volume Performed at [facility], [address] [place], [place] [address]    Culture  Setup Time   Final    GRAM POSITIVE COCCI IN CHAINS IN BOTH AEROBIC AND ANAEROBIC BOTTLES CRITICAL VALUE NOTED.  VALUE IS CONSISTENT WITH PREVIOUSLY REPORTED AND CALLED VALUE.    Culture (A)  Final    ENTEROCOCCUS FAECALIS SUSCEPTIBILITIES PERFORMED ON PREVIOUS CULTURE WITHIN THE LAST 5 DAYS. Performed at [facility], [place] [address], [place], [place] [address]    Report Status 08/07/2021 FINAL  Final  Blood culture  (routine x 2)     Status: Abnormal   Collection Time: 08/04/21 10:05 PM   Specimen: BLOOD RIGHT WRIST  Result Value Ref Range Status   Specimen Description   Final    BLOOD RIGHT WRIST Performed at [facility], [address] [place], [place] [address]    Special Requests   Final    BOTTLES DRAWN AEROBIC AND ANAEROBIC Blood Culture adequate volume Performed at [facility], [address] [place], [place] [address]    Culture  Setup Time   Final    GRAM POSITIVE COCCI IN CHAINS IN BOTH AEROBIC AND ANAEROBIC BOTTLES CRITICAL RESULT CALLED TO, READ BACK BY AND VERIFIED WITH: PHARMD MICAHEL BITONTI 08/05/21_0 :12 BY TW Performed at [facility], [place] [address] [place], [place] [address]    Culture ENTEROCOCCUS FAECALIS (A)  Final   Report Status 08/07/2021 FINAL  Final   Organism ID, Bacteria ENTEROCOCCUS FAECALIS  Final      Susceptibility   Enterococcus faecalis - MIC*    AMPICILLIN <=2 SENSITIVE Sensitive     VANCOMYCIN 1 SENSITIVE Sensitive     GENTAMICIN SYNERGY SENSITIVE Sensitive     * ENTEROCOCCUS FAECALIS  Blood Culture ID Panel (Reflexed)     Status: Abnormal   Collection Time: 08/04/21 10:05 PM  Result Value Ref Range Status   Enterococcus faecalis DETECTED (A) NOT DETECTED Final    Comment: CRITICAL RESULT CALLED TO, READ BACK BY AND VERIFIED WITH: PHARMD MICAHEL BITONTI 08/05/21_1 :12 BY TW    Enterococcus Faecium NOT DETECTED NOT DETECTED Final   Listeria monocytogenes NOT DETECTED NOT DETECTED Final   Staphylococcus species NOT DETECTED NOT DETECTED Final   Staphylococcus aureus (BCID) NOT DETECTED NOT DETECTED Final   Staphylococcus epidermidis NOT DETECTED NOT DETECTED Final   Staphylococcus lugdunensis NOT DETECTED NOT DETECTED Final   Streptococcus species NOT DETECTED NOT DETECTED Final   Streptococcus agalactiae NOT DETECTED NOT DETECTED Final   Streptococcus pneumoniae NOT DETECTED NOT DETECTED Final   Streptococcus  pyogenes NOT DETECTED NOT DETECTED Final   A.calcoaceticus-baumannii NOT DETECTED NOT DETECTED Final   Bacteroides fragilis NOT DETECTED NOT DETECTED Final   Enterobacterales NOT DETECTED NOT DETECTED Final   Enterobacter cloacae complex NOT DETECTED NOT DETECTED Final   Escherichia coli NOT DETECTED NOT DETECTED Final   Klebsiella aerogenes NOT DETECTED NOT DETECTED Final   Klebsiella oxytoca NOT DETECTED NOT DETECTED Final   Klebsiella pneumoniae NOT DETECTED NOT DETECTED  Final   Proteus species NOT DETECTED NOT DETECTED Final   Salmonella species NOT DETECTED NOT DETECTED Final   Serratia marcescens NOT DETECTED NOT DETECTED Final   Haemophilus influenzae NOT DETECTED NOT DETECTED Final   Neisseria meningitidis NOT DETECTED NOT DETECTED Final   Pseudomonas aeruginosa NOT DETECTED NOT DETECTED Final   Stenotrophomonas maltophilia NOT DETECTED NOT DETECTED Final   Candida albicans NOT DETECTED NOT DETECTED Final   Candida auris NOT DETECTED NOT DETECTED Final   Candida glabrata NOT DETECTED NOT DETECTED Final   Candida krusei NOT DETECTED NOT DETECTED Final   Candida parapsilosis NOT DETECTED NOT DETECTED Final   Candida tropicalis NOT DETECTED NOT DETECTED Final   Cryptococcus neoformans/gattii NOT DETECTED NOT DETECTED Final   Vancomycin resistance NOT DETECTED NOT DETECTED Final    Comment: Performed at [facility], [address], [place], [place] [address]  Culture, blood (routine x 2)     Status: None   Collection Time: 08/06/21  8:46 AM   Specimen: BLOOD  Result Value Ref Range Status   Specimen Description BLOOD RIGHT ANTECUBITAL  Final   Special Requests   Final    BOTTLES DRAWN AEROBIC AND ANAEROBIC Blood Culture adequate volume   Culture   Final    NO GROWTH 5 DAYS Performed at [facility], [place] [address], [place], [place] [address]    Report Status 08/11/2021 FINAL  Final  Culture, blood (routine x 2)     Status: None   Collection Time: 08/06/21  8:52  AM   Specimen: BLOOD RIGHT HAND  Result Value Ref Range Status   Specimen Description BLOOD RIGHT HAND  Final   Special Requests   Final    BOTTLES DRAWN AEROBIC ONLY Blood Culture adequate volume   Culture   Final    NO GROWTH 5 DAYS Performed at [facility], [place] [address], [place], [place] [address]    Report Status 08/11/2021 FINAL  Final  Surgical pcr screen     Status: None   Collection Time: 08/09/21  9:06 PM   Specimen: Nasal Mucosa; Nasal Swab  Result Value Ref Range Status   MRSA, PCR NEGATIVE NEGATIVE Final   Staphylococcus aureus NEGATIVE NEGATIVE Final    Comment: (NOTE) The Xpert SA Assay (FDA approved for NASAL specimens in patients 71 years of age and older), is one component of a comprehensive surveillance program. It is not intended to diagnose infection nor to guide or monitor treatment. Performed at [facility], [place] [address], [place], [place] [address]      Signed: Terrilee Croak  [facility] 08/11/2021, 3:05 PM

## 2021-08-12 DIAGNOSIS — I351 Nonrheumatic aortic (valve) insufficiency: Secondary | ICD-10-CM

## 2021-08-12 DIAGNOSIS — R7881 Bacteremia: Secondary | ICD-10-CM | POA: Diagnosis not present

## 2021-08-12 DIAGNOSIS — T826XXD Infection and inflammatory reaction due to cardiac valve prosthesis, subsequent encounter: Secondary | ICD-10-CM | POA: Diagnosis not present

## 2021-08-12 DIAGNOSIS — T82897A Other specified complication of cardiac prosthetic devices, implants and grafts, initial encounter: Secondary | ICD-10-CM

## 2021-08-12 DIAGNOSIS — B952 Enterococcus as the cause of diseases classified elsewhere: Secondary | ICD-10-CM | POA: Diagnosis not present

## 2021-08-12 DIAGNOSIS — J9601 Acute respiratory failure with hypoxia: Secondary | ICD-10-CM | POA: Diagnosis not present

## 2021-08-12 DIAGNOSIS — Z954 Presence of other heart-valve replacement: Secondary | ICD-10-CM

## 2021-08-12 NOTE — Consult Note (Addendum)
[address]       [place] [address]             [phone]        [place] M [place] Medical Record #[id number] Date of Birth: 1936/07/11  Referring: ID/[place] Primary Care: Maurice Small, MD (Inactive) Primary Cardiologist:Traci Radford Pax, MD  Chief Complaint:    Chief Complaint  Patient presents with   Shortness of Breath    History of Present Illness:      Ms. Sienkiewicz is an 85 yo female known to [facility].  She underwent a TAVR procedure performed by Dr. Cyndia Bent in 2018.  She presented to [facility] on 08/04/2021 with complaints of shaking chills, shortness of breath, fatigue and left leg pain.  She recently returned to the [place] after traveling to [place].  Her symptoms had been increasing over the past week and a half to the point she is unable to catch her breath on exertion.  This significantly worsened on day of presentation and on arrival she was hypoxic with sats in the 80s. CT of chest was negative for PE.  Labs revelaed leukocytosis and drop in hemoglobin level.  Lactic acid was also elevated.  She was admitted and transferred to [facility] for further treatment.  She was started on Empiric antibiotics.  Duplex of her LE was negative for DVT.  Blood cultures were positive for E. Faecalis.  ID consult was obtained and recommended further antibiotics and TEE to check for infective endocarditis.  This was performed on 10/24 and revealed a small aortic valve vegetation on her previously placed prosthetic valve.   Further workup with CT scan for cardiac morphology shows a large vegetation on her Aortic Valve.  Due to this cardiothoracic surgery consultation has been requested.  No complaints.  States she feels well.  Hoping to go home today.  Current Activity/ Functional Status: Patient is independent with mobility/ambulation, transfers, ADL's, IADL's.   Past Medical History:  Diagnosis Date   Anginal pain ([place])    Aortic  stenosis    severe AS with moderate AR by echo with normal LVF   Arthritis    CAD in native artery, non obstructive cath 11/2016.  12/16/2016   Cancer ([place])    left breast mastectomy(cancer)-surgery only   Chronic diastolic congestive heart failure (HCC)    Complication of anesthesia    extremely claustophobic(only occurs with narcotic medications as stated per pt)   Diverticulosis    Dysrhythmia    Afib   Fibromyalgia    GERD (gastroesophageal reflux disease)    Hard of hearing    wears hearing aides    History of breast cancer no recurrence   dx 2004   s/p  left breast mastectomy w/ node dissection/   no chemo or radiation   History of TIA (transient ischemic attack)    per scan--  no residual   Hyperlipidemia    Hypertension    Nocturia    OSA on CPAP     severe/  AHI 34/hr   Permanent atrial fibrillation ([place])    on Xarelto for CHADS2VASC score of 5   Pulmonary HTN ([place])    PASP 27mmHg by echo 11/2016   Urge urinary incontinence    Wears glasses     Past Surgical History:  Procedure Laterality Date   ABDOMINOPLASTY  [place]   BREAST SURGERY Left 2004   mastectomy  CARDIAC CATHETERIZATION N/A 11/19/2016   Procedure: Right/Left Heart Cath and Coronary Angiography;  Surgeon: Troy Sine, MD;  Location: [facility];  Service: Cardiovascular;  Laterality: N/A;   CARPAL TUNNEL RELEASE Right 07-08-2004   COLONOSCOPY WITH ESOPHAGOGASTRODUODENOSCOPY (EGD)  2006   EXPLANTATION BREAST IMPLANT AND DEBRIDEMENT Left 09/15/2005   INTERSTIM IMPLANT PLACEMENT N/A 08/22/2014   Procedure: Barrie Lyme IMPLANT STAGE ONE/TWO;  Surgeon: Reece Packer, MD;  Location: [facility];  Service: Urology;  Laterality: N/A;   JOINT REPLACEMENT Left 2017   knee replacement   MASTECTOMY     MASTECTOMY, MODIFIED RADICAL W/RECONSTRUCTION  09-16-2003   left breast W/ SLN DISSECTION (pt states had 13 operations for implant infection    REPLACE TISSUE EXPANDER  LEFT BREAST AND TOTAL CAPSULECTOMY  08-05-2006   REVERSE SHOULDER ARTHROPLASTY Left 10/26/2019   Procedure: REVERSE SHOULDER ARTHROPLASTY;  Surgeon: Netta Cedars, MD;  Location: [facility];  Service: Orthopedics;  Laterality: Left;  with interscalene block   REVISION BREAST RECONSTRUCTION Left 11-11-2004   12-03-2004  DRAINAGE SEREMA LEFT CHEST   TEE WITHOUT CARDIOVERSION N/A 12/14/2016   Procedure: TRANSESOPHAGEAL ECHOCARDIOGRAM (TEE);  Surgeon: Burnell Blanks, MD;  Location: [facility];  Service: Open Heart Surgery;  Laterality: N/A;   TEE WITHOUT CARDIOVERSION N/A 08/10/2021   Procedure: TRANSESOPHAGEAL ECHOCARDIOGRAM (TEE);  Surgeon: Buford Dresser, MD;  Location: [facility];  Service: Cardiovascular;  Laterality: N/A;   TOTAL HIP ARTHROPLASTY Left 07/17/2013   Procedure: LEFT TOTAL HIP ARTHROPLASTY ANTERIOR APPROACH;  Surgeon: Mauri Pole, MD;  Location: [facility];  Service: Orthopedics;  Laterality: Left;   TOTAL HIP ARTHROPLASTY Right 01/28/2015   Procedure: RIGHT TOTAL HIP ARTHROPLASTY ANTERIOR APPROACH;  Surgeon: Paralee Cancel, MD;  Location: [facility];  Service: Orthopedics;  Laterality: Right;   TOTAL KNEE ARTHROPLASTY Left 11/11/2015   Procedure: LEFT TOTAL KNEE ARTHROPLASTY;  Surgeon: Paralee Cancel, MD;  Location: [facility];  Service: Orthopedics;  Laterality: Left;   TOTAL SHOULDER ARTHROPLASTY  08/11/2012   Procedure: TOTAL SHOULDER ARTHROPLASTY;  Surgeon: Augustin Schooling, MD;  Location: [facility];  Service: Orthopedics;  Laterality: Right;  RIGHT  TOTAL SHOULDER  ARTHROPLASTY    TRANSCATHETER AORTIC VALVE REPLACEMENT, TRANSFEMORAL N/A 12/14/2016   Procedure: TRANSCATHETER AORTIC VALVE REPLACEMENT, TRANSFEMORAL;  Surgeon: Burnell Blanks, MD;  Location: [facility];  Service: Open Heart Surgery;  Laterality: N/A;   TRANSTHORACIC ECHOCARDIOGRAM  02-25-2014   moderate LVH/  ef 55-60%/  mod. to sev. calcification AV with mild to moderate AV stenosis/  mild to mod. AR and MR/ mild PR/    moderate to severe LAE   TUBAL LIGATION  1973    Social History   Tobacco Use  Smoking Status Former   Packs/day: 0.25   Years: 8.00   Pack years: 2.00   Types: Cigarettes   Quit date: 10/18/1966   Years since quitting: 54.8  Smokeless Tobacco Never    Social History   Substance and Sexual Activity  Alcohol Use No     Allergies  Allergen Reactions   Beta Adrenergic Blockers Shortness Of Breath   Other     Narcotic pain medications - anxiety attacks   Oxycodone Shortness Of Breath and Other (See Comments)    Tolerates Dilaudid, Fentanyl   Sotalol Hcl Shortness Of Breath and Other (See Comments)   Tramadol Anxiety and Other (See Comments)    Can take if absolutely necessary   Clonidine Derivatives Palpitations    bradycardia   Crestor [  Rosuvastatin Calcium] Other (See Comments)    MYALGIAS   Latex Rash    Per patient-bandaids- no problems with gloves   Rosuvastatin Calcium Nausea Only   Rosuvastatin    Tape Rash     bandaids     Current Facility-Administered Medications  Medication Dose Route Frequency Provider Last Rate Last Admin   acetaminophen (TYLENOL) tablet 650 mg  650 mg Oral Q6H PRN Buford Dresser, MD       Or   acetaminophen (TYLENOL) suppository 650 mg  650 mg Rectal Q6H PRN Buford Dresser, MD       albuterol (PROVENTIL) (2.5 MG/3ML) 0.083% nebulizer solution 2.5 mg  2.5 mg Nebulization Q2H PRN Buford Dresser, MD       ampicillin (OMNIPEN) 2 g in sodium chloride 0.9 % 100 mL IVPB  2 g Intravenous Q6H Buford Dresser, MD 300 mL/hr at 08/12/21 0624 2 g at 08/12/21 0624   atorvastatin (LIPITOR) tablet 80 mg  80 mg Oral Daily Buford Dresser, MD   80 mg at 08/12/21 0846   cefTRIAXone (ROCEPHIN) 2 g in sodium chloride 0.9 % 100 mL IVPB  2 g Intravenous Q12H Buford Dresser, MD 200 mL/hr at 08/12/21 0941 2 g at 08/12/21 0941   Chlorhexidine Gluconate Cloth 2 % PADS 6 each  6 each Topical Daily Dahal, Marlowe Aschoff, MD   6  each at 08/12/21 0848   diltiazem (CARDIZEM CD) 24 hr capsule 240 mg  240 mg Oral q morning Buford Dresser, MD   240 mg at 08/12/21 0846   furosemide (LASIX) tablet 20 mg  20 mg Oral Daily Buford Dresser, MD   20 mg at 08/12/21 0846   losartan (COZAAR) tablet 100 mg  100 mg Oral Daily Buford Dresser, MD   100 mg at 08/12/21 0846   melatonin tablet 10 mg  10 mg Oral QHS PRN Buford Dresser, MD   10 mg at 08/09/21 2049   pantoprazole (PROTONIX) EC tablet 40 mg  40 mg Oral Daily Buford Dresser, MD   40 mg at 08/12/21 0846   predniSONE (DELTASONE) tablet 5 mg  5 mg Oral Q breakfast Buford Dresser, MD   5 mg at 08/12/21 5035   rivaroxaban (XARELTO) tablet 20 mg  20 mg Oral Q supper Buford Dresser, MD   20 mg at 08/11/21 1826   sodium chloride flush (NS) 0.9 % injection 10-40 mL  10-40 mL Intracatheter Q12H Dahal, Marlowe Aschoff, MD   10 mL at 08/11/21 2206   sodium chloride flush (NS) 0.9 % injection 10-40 mL  10-40 mL Intracatheter PRN Terrilee Croak, MD        Medications Prior to Admission  Medication Sig Dispense Refill Last Dose   alendronate (FOSAMAX) 70 MG tablet Take 70 mg by mouth every Saturday.   08/01/2021   amoxicillin (AMOXIL) 500 MG capsule Take 2,000 mg by mouth See admin instructions. Take 4 capsules (2000 mg) by mouth 1 hour prior to dental work  0 unk   atorvastatin (LIPITOR) 80 MG tablet Take 1 tablet (80 mg total) by mouth daily. 90 tablet 3 08/03/2021   DILT-XR 240 MG 24 hr capsule Take 240 mg by mouth every morning.   08/03/2021   ezetimibe (ZETIA) 10 MG tablet Take 1 tablet (10 mg total) by mouth daily. 90 tablet 3 08/03/2021   furosemide (LASIX) 20 MG tablet Take 20 mg by mouth daily.    Past Week   hydrOXYzine (ATARAX/VISTARIL) 25 MG tablet Take 25 mg by mouth every 12 (twelve)  hours as needed for anxiety.   08/03/2021   losartan (COZAAR) 100 MG tablet Take 1 tablet (100 mg total) by mouth daily. 90 tablet 3 08/03/2021    Melatonin 10 MG TABS Take 10 mg by mouth at bedtime as needed (for sleep).    unk   nitroGLYCERIN (NITROSTAT) 0.4 MG SL tablet Place 0.4 mg under the tongue every 5 (five) minutes as needed for chest pain.    unk   Nutritional Supplements (VITAMIN D BOOSTER PO) Take 800 Units by mouth at bedtime. Kalcipos-d forte      omeprazole (PRILOSEC) 20 MG capsule Take 20 mg by mouth daily as needed (acid reflux/indigestion.).    Past Week   predniSONE (DELTASONE) 5 MG tablet Take 5 mg by mouth daily with breakfast.   08/03/2021   rivaroxaban (XARELTO) 20 MG TABS tablet Take 20 mg by mouth daily with supper.   08/03/2021 at 1830   triamcinolone ointment (KENALOG) 0.1 % Apply 1 application topically 2 (two) times daily as needed (affected areas of feet).    unk   diltiazem (CARDIZEM CD) 300 MG 24 hr capsule Take 1 capsule (300 mg total) by mouth daily. (Patient not taking: No sig reported) 90 capsule 3 Not Taking    Family History  Problem Relation Age of Onset   Heart disease Mother    Heart disease Father      Review of Systems:   ROS     Cardiac Review of Systems: Y or  [    ]= no  Chest Pain [    ]  Resting SOB [   ] Exertional SOB  [ Y ]  Orthopnea [  ]   Pedal Edema [   ]    Palpitations [Y  ] Syncope  [  ]   Presyncope [   ]  General Review of Systems: [Y] = yes [  ]=no Constitional: recent weight change [  ]; anorexia [  ]; fatigue [ Y ]; nausea [  ]; night sweats [  ]; fever [  Y]; or chills [ Y ]                                                               Dental: Last Dentist visit:   Eye : blurred vision [  ]; diplopia [   ]; vision changes [  ];  Amaurosis fugax[  ]; Resp: cough [  ];  wheezing[  ];  hemoptysis[  ]; shortness of breath[  ]; paroxysmal nocturnal dyspnea[  ]; dyspnea on exertion[Y  ]; or orthopnea[  ];  GI:  gallstones[  ], vomiting[  ];  dysphagia[  ]; melena[  ];  hematochezia [  ]; heartburn[  ];   Hx of  Colonoscopy[  ]; GU: kidney stones [  ]; hematuria[  ];    dysuria [  ];  nocturia[  ];  history of     obstruction [  ]; urinary frequency [  ]             Skin: rash, swelling[ N ];, hair loss[  ];  peripheral edema[  ];  or itching[  ]; Musculosketetal: myalgias[  ];  joint swelling[  ];  joint erythema[  ];  joint pain[  ];  back pain[  ];  Heme/Lymph: bruising[  ];  bleeding[  ];  anemia[  ];  Neuro: TIA[  ];  headaches[  ];  stroke[  ];  vertigo[  ];  seizures[  ];   paresthesias[  ];  difficulty walking[  ];  Psych:depression[  ]; anxiety[  ];  Endocrine: diabetes[  ];  thyroid dysfunction[  ];  Physical Exam: BP (!) 143/71   Pulse 79   Temp 98.5 F (36.9 C) (Oral)   Resp 18   Ht 5\' 7"  (1.702 m)   Wt 84.1 kg   SpO2 100%   BMI 29.04 kg/m    General appearance: alert, cooperative, and no distress Head: Normocephalic, without obvious abnormality, atraumatic Resp: clear to auscultation bilaterally Cardio: regular rate and rhythm and + systolic murmur GI: soft, non-tender; bowel sounds normal; no masses,  no organomegaly Extremities: extremities normal, atraumatic, no cyanosis or edema Neurologic: Grossly normal  Diagnostic Studies & Laboratory data:     Recent Radiology Findings:   CT CORONARY MORPH W/CTA COR W/SCORE W/CA W/CM &/OR WO/CM  Addendum Date: 08/11/2021   ADDENDUM REPORT: 08/11/2021 15:27 CLINICAL DATA:  Aortic Valve Endocarditis EXAM: Cardiac TAVR CT TECHNIQUE: The patient was scanned on a Siemens Force 683 slice scanner. A 120 kV retrospective scan was triggered in the descending thoracic aorta at 111 HU's. Gantry rotation speed was 270 msecs and collimation was .9 mm. No beta blockade or nitro were given. The 3D data set was reconstructed in 5% intervals of the R-R cycle. Systolic and diastolic phases were analyzed on a dedicated work station using MPR, MIP and VRT modes. The patient received 90 cc of contrast. FINDINGS: Aortic Valve: There is a 26 mm Edwards Sapien Valve. There is thickening of all three leaflets with a  large vegetation that inserts on the leaflet tips and extends into the level of the LVOT and the aortic root. There is no evidence of valvular abscess or pseudoaneurysm. There is no evidence of perforation. There is no evidence of intervalvular fibrosa abscess. There is an 18 mm maximal diameter of the vegetation. Aorta: There is aortic atherosclerosis. Main pulmonary artery: dilation of the main pulmonary artery 33 mm (moderate). Atria: There is biatrial dilation. Left atrial appendage: There is a filling defect in the left atrial appendage that is associated with thrombus. Mitral Valve: No evidence of vegetation Notable coronary artery calcification with calcium score 2124 (97th percentile) IMPRESSION: 1. There is a large vegetation of the prosthetic aortic valve without abscess. Reaching out to primary team. 2.  There is a left atrial thrombus. RECOMMENDATIONS: Coronary artery calcium (CAC) score is a strong predictor of incident coronary heart disease (CHD) and provides predictive information beyond traditional risk factors. CAC scoring is reasonable to use in the decision to withhold, postpone, or initiate statin therapy in intermediate-risk or selected borderline-risk asymptomatic adults (age 20-75 years and LDL-C >=70 to <190 mg/dL) who do not have diabetes or established atherosclerotic cardiovascular disease (ASCVD).* In intermediate-risk (10-year ASCVD risk >=7.5% to <20%) adults or selected borderline-risk (10-year ASCVD risk >=5% to <7.5%) adults in whom a CAC score is measured for the purpose of making a treatment decision the following recommendations have been made: If CAC = 0, it is reasonable to withhold statin therapy and reassess in 5 to 10 years, as long as higher risk conditions are absent (diabetes mellitus, family history of premature CHD in first degree relatives (males <55 years; females <65 years), cigarette smoking, LDL >=190 mg/dL or  other independent risk factors). If CAC is 1 to 99, it  is reasonable to initiate statin therapy for patients >=11 years of age. If CAC is >=100 or >=75th percentile, it is reasonable to initiate statin therapy at any age. Cardiology referral should be considered for patients with CAC scores >=400 or >=75th percentile. *2018 AHA/ACC/AACVPR/AAPA/ABC/ACPM/ADA/AGS/APhA/ASPC/NLA/PCNA Guideline on the Management of Blood Cholesterol: A Report of the American College of Cardiology/American Heart Association Task Force on Clinical Practice Guidelines. J Am Coll Cardiol. 2019;73(24):3168-3209. Mahesh  Chandrasekhar Electronically Signed   By: Rudean Haskell M.D.   On: 08/11/2021 15:27   Result Date: 08/11/2021 EXAM: OVER-READ INTERPRETATION  CT CHEST The following report is an over-read performed by radiologist Dr. Aletta Edouard of [facility], [place] on 08/11/2021. This over-read does not include interpretation of cardiac or coronary anatomy or pathology. The coronary CTA interpretation by the cardiologist is attached. COMPARISON:  CT a of the chest on 08/04/2021 FINDINGS: Vascular: Aortic atherosclerosis without visualized aneurysm. Dilated central pulmonary arteries with main pulmonary artery measuring up to approximately 3.3 cm. Mediastinum/Nodes: Visualized mediastinum and hilar regions demonstrate no lymphadenopathy or masses. Lungs/Pleura: Mild patchy ground-glass opacities scattered throughout both lungs may be slightly more prominent compared to the prior CTA and is suggestive either mild alveolar edema or atypical infection. Correlation suggested clinically. No pleural effusions, dense airspace consolidation or pneumothorax detected. Stable 3 mm subpleural nodular density in the lateral right lower lobe. This may be partially calcified based on appearance. Upper Abdomen: No acute abnormality. Musculoskeletal: No chest wall mass or suspicious bone lesions identified. IMPRESSION: 1. Stable aortic atherosclerosis without visualized aneurysm. 2. Dilated  central pulmonary arteries suggesting component of underlying pulmonary hypertension. 3. Mild patchy ground-glass opacities scattered throughout both lungs may be slightly more prominent compared to the prior CTA and is suggestive of either mild pulmonary edema or atypical infection. 4. Stable 3 mm subpleural nodule in the lateral right lower lobe. This may be partially calcified. Electronically Signed: By: Aletta Edouard M.D. On: 08/11/2021 13:46   DG CHEST PORT 1 VIEW  Result Date: 08/11/2021 CLINICAL DATA:  PICC line placement EXAM: PORTABLE CHEST 1 VIEW COMPARISON:  07/27/2021 FINDINGS: Cardiomegaly. Prior TAVR. Right PICC line in place with the tip in the SVC. No confluent opacities, effusions or edema. No acute bony abnormality. IMPRESSION: Right PICC line tip in the SVC. Cardiomegaly. No active disease. Electronically Signed   By: Rolm Baptise M.D.   On: 08/11/2021 17:53   [place] EKG SITE RITE  Result Date: 08/11/2021 If Site Rite image not attached, placement could not be confirmed due to current cardiac rhythm.    I have independently reviewed the above radiologic studies and discussed with the patient   Recent Lab Findings: Lab Results  Component Value Date   WBC 11.1 (H) 08/10/2021   HGB 9.6 (L) 08/10/2021   HCT 29.8 (L) 08/10/2021   PLT 209 08/10/2021   GLUCOSE 82 08/10/2021   CHOL 148 02/02/2021   TRIG 76 02/02/2021   HDL 78 02/02/2021   LDLCALC 55 02/02/2021   ALT 15 08/05/2021   AST 20 08/05/2021   NA 138 08/10/2021   K 3.4 (L) 08/10/2021   CL 105 08/10/2021   CREATININE 0.99 08/10/2021   BUN 12 08/10/2021   CO2 21 (L) 08/10/2021   TSH 1.570 01/24/2012   INR 1.32 12/14/2016   HGBA1C 5.6 12/10/2016      Assessment / Plan:     Infective Endocarditis of TAVR valve placed  in 2018- patient would be high risk for surgery and is not a candidate. Surgery is not indicated at this time.  Would follow ID recommendations for antibiotic treatment... can follow up with  Cardiology/TAVR clinic once initial course is completed  Ellwood Handler, PA-C  08/12/2021 11:20 AM

## 2021-08-12 NOTE — Progress Notes (Signed)
AVS given and reviewed with pt. Medications discussed. Pam with [employer] to bedside for IV abx teaching with pt and pt's husband. All questions answered to satisfaction. Pt and pt's husband verbalized understanding of information given. Pt escorted off the unit with all belongings via wheelchair by this RN.

## 2021-08-12 NOTE — Progress Notes (Signed)
Patient seen and examined.  No overnight events.  Denies any chest pain or shortness of breath.  Feels lousy otherwise no complaints.  Walking around.  PICC line right arm intact.  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 fib on Cardizem and therapeutic on Xarelto Essential hypertension  Patient was noted to be not a surgical candidate for prosthetic valve endocarditis. Planning to treat with IV ampicillin and IV Rocephin for 6 weeks, cardiology and infectious disease follow-up.  Drug monitoring and lab testing as well as follow-up as scheduled with ID and cardiology clinic.  Stable for discharge with home infusion arrangements.   Total time spent: 22 minutes.  Discharge summary from 10/25 reviewed, discussed with cardiology infectious disease team.  Patient is stable for discharge.

## 2021-08-12 NOTE — TOC Progression Note (Addendum)
Transition of Care [facility]) - Progression Note    Patient Details  Name: [name] MRN: [id number] Date of Birth: 1936-10-12  [facility] CM/SW Contact  Jacalyn Lefevre Edson Snowball, RN Phone Number: 08/12/2021, 10:47 AM  Clinical Narrative:     In progression MD reported possible discharge today.   Pam with [employer] and Ramond Marrow with [facility] both aware.   Pam with [employer] will hook up late today around 4 to 4:30 due to short drug stability and mixing. She is explaining to patient    Expected Discharge Plan: [place]    Expected Discharge Plan and Services Expected Discharge Plan: [place]   Discharge Planning Services: CM Consult Post Acute Care Choice: [place] arrangements for the past 2 months: Single Family Home Expected Discharge Date: 08/11/21                 DME Agency: NA       HH Arranged: RN [place] Agency: [place] ([place]) Date [place]: 08/10/21 Time [place]: [id number] Representative spoke with at [place]: [place] Determinants of Health ([place]) Interventions    Readmission Risk Interventions No flowsheet data found.

## 2021-08-12 NOTE — Progress Notes (Signed)
[facility] for Infectious Disease  Date of Admission:  08/04/2021     Total days of antibiotics 9         ASSESSMENT:  Ms. [name] continues to receive ampicillin and ceftriaxone for Enterococcus faecalis aortic prosthetic valve endocarditis with no adverse side effects. Have asked CVTS to evaluate for any potential surgical interventions. Discussed the continued risks of endocarditis including possibility for septic emboli that increase risk of stroke. Will continue with current plan of 6 weeks of ampicillin and ceftriaxone. She will be in [place] at the end of treatment and need follow up there at the end of treatment as she will need chronic suppression with Amoxicillin at the completion of her IV treatment.   PLAN:  Continue current dose of ceftriaxone and ampicillin  OPAT orders placed. CVTS evaluation for surgical intervention as appropriate.  Will need follow up at end of treatment in [place].  Remains okay for discharge from ID standpoint pending CVTS.   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   CAP (community acquired pneumonia)   Severe sepsis (HCC)   Chronic anticoagulation - on Xarelto for afib   Acute on chronic anemia   Demand ischemia (HCC)   Bacteremia   Prosthetic valve endocarditis (HCC)   Enterococcus faecalis infection    atorvastatin  80 mg Oral Daily   Chlorhexidine Gluconate Cloth  6 each Topical Daily   diltiazem  240 mg Oral q morning   furosemide  20 mg Oral Daily   losartan  100 mg Oral Daily   pantoprazole  40 mg Oral Daily   predniSONE  5 mg Oral Q breakfast   rivaroxaban  20 mg Oral Q supper   sodium chloride flush  10-40 mL Intracatheter Q12H    SUBJECTIVE:  Afebrile overnight with no acute events. No fevers/chills.   Allergies  Allergen Reactions   Beta  Adrenergic Blockers Shortness Of Breath   Other     Narcotic pain medications - anxiety attacks   Oxycodone Shortness Of Breath and Other (See Comments)    Tolerates Dilaudid, Fentanyl   Sotalol Hcl Shortness Of Breath and Other (See Comments)   Tramadol Anxiety and Other (See Comments)    Can take if absolutely necessary   Clonidine Derivatives Palpitations    bradycardia   Crestor [Rosuvastatin Calcium] Other (See Comments)    MYALGIAS   Latex Rash    Per patient-bandaids- no problems with gloves   Rosuvastatin Calcium Nausea Only   Rosuvastatin    Tape Rash     bandaids      Review of Systems: Review of Systems  Constitutional:  Negative for chills, fever and weight loss.  Respiratory:  Negative for cough, shortness of breath and wheezing.   Cardiovascular:  Negative for chest pain and leg swelling.  Gastrointestinal:  Negative for abdominal pain, constipation, diarrhea, nausea and vomiting.  Skin:  Negative for rash.     OBJECTIVE: Vitals:   08/11/21 2041 08/12/21 0504 08/12/21 0827 08/12/21 1010  BP: (!) 181/92 (!) 180/88  (!) 143/71  Pulse: 95 75 90 79  Resp: 18 20 18    Temp: 97.7 F (36.5 C) (!) 97.5 F (36.4 C) 98.5 F (36.9 C)   TempSrc: Oral Oral Oral   SpO2: 98% (!) 89% 99% 100%  Weight:      Height:  Body mass index is 29.04 kg/m.  Physical Exam Constitutional:      General: She is not in acute distress.    Appearance: She is well-developed.  Cardiovascular:     Rate and Rhythm: Normal rate and regular rhythm.     Heart sounds: Normal heart sounds.  Pulmonary:     Effort: Pulmonary effort is normal.     Breath sounds: Normal breath sounds.  Skin:    General: Skin is warm and dry.  Neurological:     Mental Status: She is alert and oriented to person, place, and time.  Psychiatric:        Behavior: Behavior normal.        Thought Content: Thought content normal.        Judgment: Judgment normal.    Lab Results Lab Results   Component Value Date   WBC 11.1 (H) 08/10/2021   HGB 9.6 (L) 08/10/2021   HCT 29.8 (L) 08/10/2021   MCV 94.6 08/10/2021   PLT 209 08/10/2021    Lab Results  Component Value Date   CREATININE 0.99 08/10/2021   BUN 12 08/10/2021   NA 138 08/10/2021   K 3.4 (L) 08/10/2021   CL 105 08/10/2021   CO2 21 (L) 08/10/2021    Lab Results  Component Value Date   ALT 15 08/05/2021   AST 20 08/05/2021   ALKPHOS 61 08/05/2021   BILITOT 1.1 08/05/2021     Microbiology: Recent Results (from the past 240 hour(s))  Resp Panel by RT-PCR (Flu A&B, Covid) Nasopharyngeal Swab     Status: None   Collection Time: 08/04/21  9:38 PM   Specimen: Nasopharyngeal Swab; Nasopharyngeal(NP) swabs in vial transport medium  Result Value Ref Range Status   SARS Coronavirus 2 by RT PCR NEGATIVE NEGATIVE Final    Comment: (NOTE) SARS-CoV-2 target nucleic acids are NOT DETECTED.  The SARS-CoV-2 RNA is generally detectable in upper respiratory specimens during the acute phase of infection. The lowest concentration of SARS-CoV-2 viral copies this assay can detect is 138 copies/mL. A negative result does not preclude SARS-Cov-2 infection and should not be used as the sole basis for treatment or other patient management decisions. A negative result may occur with  improper specimen collection/handling, submission of specimen other than nasopharyngeal swab, presence of viral mutation(s) within the areas targeted by this assay, and inadequate number of viral copies(<138 copies/mL). A negative result must be combined with clinical observations, patient history, and epidemiological information. The expected result is Negative.  Fact Sheet for Patients:  EntrepreneurPulse.com.au  Fact Sheet for Healthcare Providers:  IncredibleEmployment.be  This test is no t yet approved or cleared by the [place] FDA and  has been authorized for detection and/or diagnosis of  SARS-CoV-2 by FDA under an Emergency Use Authorization (EUA). This EUA will remain  in effect (meaning this test can be used) for the duration of the COVID-19 declaration under Section 564(b)(1) of the Act, 21 U.S.C.section 360bbb-3(b)(1), unless the authorization is terminated  or revoked sooner.       Influenza A by PCR NEGATIVE NEGATIVE Final   Influenza B by PCR NEGATIVE NEGATIVE Final    Comment: (NOTE) The Xpert Xpress SARS-CoV-2/FLU/RSV plus assay is intended as an aid in the diagnosis of influenza from Nasopharyngeal swab specimens and should not be used as a sole basis for treatment. Nasal washings and aspirates are unacceptable for Xpert Xpress SARS-CoV-2/FLU/RSV testing.  Fact Sheet for Patients: EntrepreneurPulse.com.au  Fact Sheet for Healthcare Providers: IncredibleEmployment.be  This test is not yet approved or cleared by the [place] and has been authorized for detection and/or diagnosis of SARS-CoV-2 by FDA under an Emergency Use Authorization (EUA). This EUA will remain in effect (meaning this test can be used) for the duration of the COVID-19 declaration under Section 564(b)(1) of the Act, 21 U.S.C. section 360bbb-3(b)(1), unless the authorization is terminated or revoked.  Performed at [facility], [address] [place], [place] [address]   Blood culture (routine x 2)     Status: Abnormal   Collection Time: 08/04/21  9:50 PM   Specimen: BLOOD RIGHT FOREARM  Result Value Ref Range Status   Specimen Description   Final    BLOOD RIGHT FOREARM Performed at [facility], [address] [place], [place] [address]    Special Requests   Final    BOTTLES DRAWN AEROBIC AND ANAEROBIC Blood Culture adequate volume Performed at [facility], [address] [place], [place] [address]    Culture  Setup Time   Final    GRAM POSITIVE COCCI IN CHAINS IN BOTH AEROBIC AND ANAEROBIC  BOTTLES CRITICAL VALUE NOTED.  VALUE IS CONSISTENT WITH PREVIOUSLY REPORTED AND CALLED VALUE.    Culture (A)  Final    ENTEROCOCCUS FAECALIS SUSCEPTIBILITIES PERFORMED ON PREVIOUS CULTURE WITHIN THE LAST 5 DAYS. Performed at [facility], [place] [address] [place], [place] [address]    Report Status 08/07/2021 FINAL  Final  Blood culture (routine x 2)     Status: Abnormal   Collection Time: 08/04/21 10:05 PM   Specimen: BLOOD RIGHT WRIST  Result Value Ref Range Status   Specimen Description   Final    BLOOD RIGHT WRIST Performed at [facility], [address] [place], [place] [address]    Special Requests   Final    BOTTLES DRAWN AEROBIC AND ANAEROBIC Blood Culture adequate volume Performed at [facility], [address] [place], [place] [address]    Culture  Setup Time   Final    GRAM POSITIVE COCCI IN CHAINS IN BOTH AEROBIC AND ANAEROBIC BOTTLES CRITICAL RESULT CALLED TO, READ BACK BY AND VERIFIED WITH: PHARMD MICAHEL BITONTI 08/05/21@21 :12 BY TW Performed at [facility], [place] [address], [place], [place] [address]    Culture ENTEROCOCCUS FAECALIS (A)  Final   Report Status 08/07/2021 FINAL  Final   Organism ID, Bacteria ENTEROCOCCUS FAECALIS  Final      Susceptibility   Enterococcus faecalis - MIC*    AMPICILLIN <=2 SENSITIVE Sensitive     VANCOMYCIN 1 SENSITIVE Sensitive     GENTAMICIN SYNERGY SENSITIVE Sensitive     * ENTEROCOCCUS FAECALIS  Blood Culture ID Panel (Reflexed)     Status: Abnormal   Collection Time: 08/04/21 10:05 PM  Result Value Ref Range Status   Enterococcus faecalis DETECTED (A) NOT DETECTED Final    Comment: CRITICAL RESULT CALLED TO, READ BACK BY AND VERIFIED WITH: PHARMD MICAHEL BITONTI 08/05/21@21 :12 BY TW    Enterococcus Faecium NOT DETECTED NOT DETECTED Final   Listeria monocytogenes NOT DETECTED NOT DETECTED Final   Staphylococcus species NOT DETECTED NOT DETECTED Final   Staphylococcus aureus (BCID) NOT  DETECTED NOT DETECTED Final   Staphylococcus epidermidis NOT DETECTED NOT DETECTED Final   Staphylococcus lugdunensis NOT DETECTED NOT DETECTED Final   Streptococcus species NOT DETECTED NOT DETECTED Final   Streptococcus agalactiae NOT DETECTED NOT DETECTED Final  Streptococcus pneumoniae NOT DETECTED NOT DETECTED Final   Streptococcus pyogenes NOT DETECTED NOT DETECTED Final   A.calcoaceticus-baumannii NOT DETECTED NOT DETECTED Final   Bacteroides fragilis NOT DETECTED NOT DETECTED Final   Enterobacterales NOT DETECTED NOT DETECTED Final   Enterobacter cloacae complex NOT DETECTED NOT DETECTED Final   Escherichia coli NOT DETECTED NOT DETECTED Final   Klebsiella aerogenes NOT DETECTED NOT DETECTED Final   Klebsiella oxytoca NOT DETECTED NOT DETECTED Final   Klebsiella pneumoniae NOT DETECTED NOT DETECTED Final   Proteus species NOT DETECTED NOT DETECTED Final   Salmonella species NOT DETECTED NOT DETECTED Final   Serratia marcescens NOT DETECTED NOT DETECTED Final   Haemophilus influenzae NOT DETECTED NOT DETECTED Final   Neisseria meningitidis NOT DETECTED NOT DETECTED Final   Pseudomonas aeruginosa NOT DETECTED NOT DETECTED Final   Stenotrophomonas maltophilia NOT DETECTED NOT DETECTED Final   Candida albicans NOT DETECTED NOT DETECTED Final   Candida auris NOT DETECTED NOT DETECTED Final   Candida glabrata NOT DETECTED NOT DETECTED Final   Candida krusei NOT DETECTED NOT DETECTED Final   Candida parapsilosis NOT DETECTED NOT DETECTED Final   Candida tropicalis NOT DETECTED NOT DETECTED Final   Cryptococcus neoformans/gattii NOT DETECTED NOT DETECTED Final   Vancomycin resistance NOT DETECTED NOT DETECTED Final    Comment: Performed at [facility], [address], [place], [place] [address]  Culture, blood (routine x 2)     Status: None   Collection Time: 08/06/21  8:46 AM   Specimen: BLOOD  Result Value Ref Range Status   Specimen Description BLOOD RIGHT ANTECUBITAL   Final   Special Requests   Final    BOTTLES DRAWN AEROBIC AND ANAEROBIC Blood Culture adequate volume   Culture   Final    NO GROWTH 5 DAYS Performed at [facility], [place] [address], [place], [place] [address]    Report Status 08/11/2021 FINAL  Final  Culture, blood (routine x 2)     Status: None   Collection Time: 08/06/21  8:52 AM   Specimen: BLOOD RIGHT HAND  Result Value Ref Range Status   Specimen Description BLOOD RIGHT HAND  Final   Special Requests   Final    BOTTLES DRAWN AEROBIC ONLY Blood Culture adequate volume   Culture   Final    NO GROWTH 5 DAYS Performed at [facility], [place] [address] [place], [place] [address]    Report Status 08/11/2021 FINAL  Final  Surgical pcr screen     Status: None   Collection Time: 08/09/21  9:06 PM   Specimen: Nasal Mucosa; Nasal Swab  Result Value Ref Range Status   MRSA, PCR NEGATIVE NEGATIVE Final   Staphylococcus aureus NEGATIVE NEGATIVE Final    Comment: (NOTE) The Xpert SA Assay (FDA approved for NASAL specimens in patients 95 years of age and older), is one component of a comprehensive surveillance program. It is not intended to diagnose infection nor to guide or monitor treatment. Performed at [facility], [place] [address], [place],  [address]      Terri Piedra, [facility]  08/12/2021  11:06 AM

## 2021-08-12 NOTE — Plan of Care (Signed)
  Problem: Activity: Goal: Ability to tolerate increased activity will improve Outcome: Progressing   Problem: Clinical Measurements: Goal: Ability to maintain a body temperature in the normal range will improve Outcome: Progressing   Problem: Respiratory: Goal: Ability to maintain adequate ventilation will improve Outcome: Progressing   Problem: Nutrition: Goal: Adequate nutrition will be maintained Outcome: Progressing   Problem: Pain Managment: Goal: General experience of comfort will improve Outcome: Progressing   Problem: Safety: Goal: Ability to remain free from injury will improve Outcome: Progressing   Problem: Skin Integrity: Goal: Risk for impaired skin integrity will decrease Outcome: Progressing

## 2021-08-12 NOTE — Progress Notes (Signed)
Progress Note  Patient Name: [name] Date of Encounter: 08/12/2021  Primary Cardiologist: Fransico Him, MD   Subjective   Cardiac CT showed evidence of large vegetation and LAA thrombus.  Husband asks if her prosthetic knee could be affected; knee pain from one week prior has not resolved.  No further fevers or chills.  Inpatient Medications    Scheduled Meds:  atorvastatin  80 mg Oral Daily   Chlorhexidine Gluconate Cloth  6 each Topical Daily   diltiazem  240 mg Oral q morning   furosemide  20 mg Oral Daily   losartan  100 mg Oral Daily   pantoprazole  40 mg Oral Daily   predniSONE  5 mg Oral Q breakfast   rivaroxaban  20 mg Oral Q supper   sodium chloride flush  10-40 mL Intracatheter Q12H   Continuous Infusions:  ampicillin (OMNIPEN) IV 2 g (08/12/21 0624)   cefTRIAXone (ROCEPHIN)  IV 2 g (08/12/21 0941)   PRN Meds: acetaminophen **OR** acetaminophen, albuterol, melatonin, sodium chloride flush   Vital Signs    Vitals:   08/11/21 2041 08/12/21 0504 08/12/21 0827 08/12/21 1010  BP: (!) 181/92 (!) 180/88  (!) 143/71  Pulse: 95 75 90 79  Resp: 18 20 18    Temp: 97.7 F (36.5 C) (!) 97.5 F (36.4 C) 98.5 F (36.9 C)   TempSrc: Oral Oral Oral   SpO2: 98% (!) 89% 99% 100%  Weight:      Height:        Intake/Output Summary (Last 24 hours) at 08/12/2021 1126 Last data filed at 08/12/2021 0900 Gross per 24 hour  Intake 237 ml  Output --  Net 237 ml   Filed Weights   08/08/21 0535 08/09/21 0422 08/10/21 0803  Weight: 84.1 kg 84.1 kg 84.1 kg    Telemetry    None on tele - Personally Reviewed  ECG    No new - Personally Reviewed  Physical Exam   GEN: No acute distress.   Neck: No JVD Cardiac: IRIR, soft systolic murmur rubs, or gallops.  Respiratory: Clear to auscultation bilaterally. GI: Soft, nontender, non-distended  MS: No edema; No deformity; there is no swelling or erythema around either knee Neuro:  Nonfocal  Psych: Normal  affect   Labs    Chemistry Recent Labs  Lab 08/08/21 0115 08/09/21 0204 08/10/21 0109  NA 140 139 138  K 3.5 3.4* 3.4*  CL 111 107 105  CO2 20* 21* 21*  GLUCOSE 93 87 82  BUN 19 15 12   CREATININE 1.00 0.99 0.99  CALCIUM 8.6* 8.8* 8.5*  GFRNONAA 56* 57* 57*  ANIONGAP 9 11 12      Hematology Recent Labs  Lab 08/08/21 0115 08/09/21 0204 08/10/21 0109  WBC 9.3 11.8* 11.1*  RBC 2.82* 3.41* 3.15*  HGB 8.6* 10.3* 9.6*  HCT 26.4* 32.5* 29.8*  MCV 93.6 95.3 94.6  MCH 30.5 30.2 30.5  MCHC 32.6 31.7 32.2  RDW 14.8 14.9 15.1  PLT 175 207 209    Cardiac EnzymesNo results for input(s): TROPONINI in the last 168 hours. No results for input(s): TROPIPOC in the last 168 hours.   BNP No results for input(s): BNP, PROBNP in the last 168 hours.    DDimer No results for input(s): DDIMER in the last 168 hours.   Radiology    CT CORONARY MORPH W/CTA COR W/SCORE W/CA W/CM &/OR WO/CM  Addendum Date: 08/11/2021   ADDENDUM REPORT: 08/11/2021 15:27 CLINICAL DATA:  Aortic Valve Endocarditis  EXAM: Cardiac TAVR CT TECHNIQUE: The patient was scanned on a Siemens Force 161 slice scanner. A 120 kV retrospective scan was triggered in the descending thoracic aorta at 111 HU's. Gantry rotation speed was 270 msecs and collimation was .9 mm. No beta blockade or nitro were given. The 3D data set was reconstructed in 5% intervals of the R-R cycle. Systolic and diastolic phases were analyzed on a dedicated work station using MPR, MIP and VRT modes. The patient received 90 cc of contrast. FINDINGS: Aortic Valve: There is a 26 mm Edwards Sapien Valve. There is thickening of all three leaflets with a large vegetation that inserts on the leaflet tips and extends into the level of the LVOT and the aortic root. There is no evidence of valvular abscess or pseudoaneurysm. There is no evidence of perforation. There is no evidence of intervalvular fibrosa abscess. There is an 18 mm maximal diameter of the vegetation.  Aorta: There is aortic atherosclerosis. Main pulmonary artery: dilation of the main pulmonary artery 33 mm (moderate). Atria: There is biatrial dilation. Left atrial appendage: There is a filling defect in the left atrial appendage that is associated with thrombus. Mitral Valve: No evidence of vegetation Notable coronary artery calcification with calcium score 2124 (97th percentile) IMPRESSION: 1. There is a large vegetation of the prosthetic aortic valve without abscess. Reaching out to primary team. 2.  There is a left atrial thrombus. RECOMMENDATIONS: Coronary artery calcium (CAC) score is a strong predictor of incident coronary heart disease (CHD) and provides predictive information beyond traditional risk factors. CAC scoring is reasonable to use in the decision to withhold, postpone, or initiate statin therapy in intermediate-risk or selected borderline-risk asymptomatic adults (age 51-75 years and LDL-C >=70 to <190 mg/dL) who do not have diabetes or established atherosclerotic cardiovascular disease (ASCVD).* In intermediate-risk (10-year ASCVD risk >=7.5% to <20%) adults or selected borderline-risk (10-year ASCVD risk >=5% to <7.5%) adults in whom a CAC score is measured for the purpose of making a treatment decision the following recommendations have been made: If CAC = 0, it is reasonable to withhold statin therapy and reassess in 5 to 10 years, as long as higher risk conditions are absent (diabetes mellitus, family history of premature CHD in first degree relatives (males <55 years; females <65 years), cigarette smoking, LDL >=190 mg/dL or other independent risk factors). If CAC is 1 to 99, it is reasonable to initiate statin therapy for patients >=31 years of age. If CAC is >=100 or >=75th percentile, it is reasonable to initiate statin therapy at any age. Cardiology referral should be considered for patients with CAC scores >=400 or >=75th percentile. *2018  AHA/ACC/AACVPR/AAPA/ABC/ACPM/ADA/AGS/APhA/ASPC/NLA/PCNA Guideline on the Management of Blood Cholesterol: A Report of the American College of Cardiology/American Heart Association Task Force on Clinical Practice Guidelines. J Am Coll Cardiol. 2019;73(24):3168-3209. Damany Eastman Electronically Signed   By: Rudean Haskell M.D.   On: 08/11/2021 15:27   Result Date: 08/11/2021 EXAM: OVER-READ INTERPRETATION  CT CHEST The following report is an over-read performed by radiologist Dr. Aletta Edouard of [facility], [place] on 08/11/2021. This over-read does not include interpretation of cardiac or coronary anatomy or pathology. The coronary CTA interpretation by the cardiologist is attached. COMPARISON:  CT a of the chest on 08/04/2021 FINDINGS: Vascular: Aortic atherosclerosis without visualized aneurysm. Dilated central pulmonary arteries with main pulmonary artery measuring up to approximately 3.3 cm. Mediastinum/Nodes: Visualized mediastinum and hilar regions demonstrate no lymphadenopathy or masses. Lungs/Pleura: Mild patchy ground-glass opacities scattered  throughout both lungs may be slightly more prominent compared to the prior CTA and is suggestive either mild alveolar edema or atypical infection. Correlation suggested clinically. No pleural effusions, dense airspace consolidation or pneumothorax detected. Stable 3 mm subpleural nodular density in the lateral right lower lobe. This may be partially calcified based on appearance. Upper Abdomen: No acute abnormality. Musculoskeletal: No chest wall mass or suspicious bone lesions identified. IMPRESSION: 1. Stable aortic atherosclerosis without visualized aneurysm. 2. Dilated central pulmonary arteries suggesting component of underlying pulmonary hypertension. 3. Mild patchy ground-glass opacities scattered throughout both lungs may be slightly more prominent compared to the prior CTA and is suggestive of either mild pulmonary edema or atypical  infection. 4. Stable 3 mm subpleural nodule in the lateral right lower lobe. This may be partially calcified. Electronically Signed: By: Aletta Edouard M.D. On: 08/11/2021 13:46   DG CHEST PORT 1 VIEW  Result Date: 08/11/2021 CLINICAL DATA:  PICC line placement EXAM: PORTABLE CHEST 1 VIEW COMPARISON:  07/27/2021 FINDINGS: Cardiomegaly. Prior TAVR. Right PICC line in place with the tip in the SVC. No confluent opacities, effusions or edema. No acute bony abnormality. IMPRESSION: Right PICC line tip in the SVC. Cardiomegaly. No active disease. Electronically Signed   By: Rolm Baptise M.D.   On: 08/11/2021 17:53   [place] EKG SITE RITE  Result Date: 08/11/2021 If Site Rite image not attached, placement could not be confirmed due to current cardiac rhythm.    Patient Profile     85 y.o. female with prosthetic valve endocarditis  Assessment & Plan    Endocarditis of TAVR valve:  - discussed with IM and CT surgery was called (large vegetation greater than 1 cm). I have significant concerns that patient would not tolerate SAVR and would be a high surgical risk, no evidence of AI or AS at this time - ABX per ID;  suspect she will need chronic suppression - reviewed imaging with patient and husband  Permanent atrial fibrillation:  - rates well controlled on home diltiazem - LAA thrombus on Xarelto; no plans for DCCV - has BB allergy (SOB)     HTN  - BP elevated post increase diltiazem for CT; will change back to home regimen for 08/13/21 - Continue diltiazem, losartan, and lasix  Non-obstructive CAD with high CAC score:  - Continue statin, asymptomatic   HLD:  - LDL 55 01/2021; at goal of <70 - continue atorvastatin   OSA: on CPAP - Continue CPAP use at discharge   Will work on follow up with me, Dr. Radford Pax or Dr. Julianne Handler (or Pod)  For questions or updates, please contact [facility] Please consult [email or web address] for contact info under Cardiology/STEMI.      Signed, Werner Lean, MD  08/12/2021, 11:26 AM

## 2021-08-12 NOTE — Progress Notes (Signed)
Mobility Specialist Progress Note:   08/12/21 0945  Mobility  Activity Ambulated in hall  Level of Assistance Modified independent, requires aide device or extra time  Assistive Device Front wheel walker  Distance Ambulated (ft) 300 ft  Mobility Ambulated independently in hallway  Mobility Response Tolerated well  Mobility performed by Mobility specialist  Bed Position Chair  $Mobility charge 1 Mobility   Pt c/o minor SOB, otherwise asx during ambulation.   Nelta Numbers Mobility Specialist  Phone [phone]

## 2021-08-13 DIAGNOSIS — Z9181 History of falling: Secondary | ICD-10-CM | POA: Diagnosis not present

## 2021-08-13 DIAGNOSIS — Z7952 Long term (current) use of systemic steroids: Secondary | ICD-10-CM | POA: Diagnosis not present

## 2021-08-13 DIAGNOSIS — Z452 Encounter for adjustment and management of vascular access device: Secondary | ICD-10-CM | POA: Diagnosis not present

## 2021-08-13 DIAGNOSIS — I5032 Chronic diastolic (congestive) heart failure: Secondary | ICD-10-CM | POA: Diagnosis not present

## 2021-08-13 DIAGNOSIS — I25119 Atherosclerotic heart disease of native coronary artery with unspecified angina pectoris: Secondary | ICD-10-CM | POA: Diagnosis not present

## 2021-08-13 DIAGNOSIS — M1990 Unspecified osteoarthritis, unspecified site: Secondary | ICD-10-CM | POA: Diagnosis not present

## 2021-08-13 DIAGNOSIS — Z7901 Long term (current) use of anticoagulants: Secondary | ICD-10-CM | POA: Diagnosis not present

## 2021-08-13 DIAGNOSIS — I272 Pulmonary hypertension, unspecified: Secondary | ICD-10-CM | POA: Diagnosis not present

## 2021-08-13 DIAGNOSIS — M797 Fibromyalgia: Secondary | ICD-10-CM | POA: Diagnosis not present

## 2021-08-13 DIAGNOSIS — Z7983 Long term (current) use of bisphosphonates: Secondary | ICD-10-CM | POA: Diagnosis not present

## 2021-08-13 DIAGNOSIS — D500 Iron deficiency anemia secondary to blood loss (chronic): Secondary | ICD-10-CM | POA: Diagnosis not present

## 2021-08-13 DIAGNOSIS — I110 Hypertensive heart disease with heart failure: Secondary | ICD-10-CM | POA: Diagnosis not present

## 2021-08-13 DIAGNOSIS — Z792 Long term (current) use of antibiotics: Secondary | ICD-10-CM | POA: Diagnosis not present

## 2021-08-13 DIAGNOSIS — E785 Hyperlipidemia, unspecified: Secondary | ICD-10-CM | POA: Diagnosis not present

## 2021-08-13 DIAGNOSIS — N390 Urinary tract infection, site not specified: Secondary | ICD-10-CM | POA: Diagnosis not present

## 2021-08-13 DIAGNOSIS — I248 Other forms of acute ischemic heart disease: Secondary | ICD-10-CM | POA: Diagnosis not present

## 2021-08-13 DIAGNOSIS — Z87891 Personal history of nicotine dependence: Secondary | ICD-10-CM | POA: Diagnosis not present

## 2021-08-13 DIAGNOSIS — T826XXA Infection and inflammatory reaction due to cardiac valve prosthesis, initial encounter: Secondary | ICD-10-CM | POA: Diagnosis not present

## 2021-08-13 DIAGNOSIS — A4181 Sepsis due to Enterococcus: Secondary | ICD-10-CM | POA: Diagnosis not present

## 2021-08-13 DIAGNOSIS — K219 Gastro-esophageal reflux disease without esophagitis: Secondary | ICD-10-CM | POA: Diagnosis not present

## 2021-08-13 DIAGNOSIS — N3941 Urge incontinence: Secondary | ICD-10-CM | POA: Diagnosis not present

## 2021-08-13 DIAGNOSIS — G4733 Obstructive sleep apnea (adult) (pediatric): Secondary | ICD-10-CM | POA: Diagnosis not present

## 2021-08-13 DIAGNOSIS — H9190 Unspecified hearing loss, unspecified ear: Secondary | ICD-10-CM | POA: Diagnosis not present

## 2021-08-13 DIAGNOSIS — I4821 Permanent atrial fibrillation: Secondary | ICD-10-CM | POA: Diagnosis not present

## 2021-08-13 DIAGNOSIS — J188 Other pneumonia, unspecified organism: Secondary | ICD-10-CM | POA: Diagnosis not present

## 2021-08-17 DIAGNOSIS — N390 Urinary tract infection, site not specified: Secondary | ICD-10-CM | POA: Diagnosis not present

## 2021-08-17 DIAGNOSIS — R112 Nausea with vomiting, unspecified: Secondary | ICD-10-CM | POA: Diagnosis not present

## 2021-08-17 DIAGNOSIS — J188 Other pneumonia, unspecified organism: Secondary | ICD-10-CM | POA: Diagnosis not present

## 2021-08-17 DIAGNOSIS — Z452 Encounter for adjustment and management of vascular access device: Secondary | ICD-10-CM | POA: Diagnosis not present

## 2021-08-17 DIAGNOSIS — I110 Hypertensive heart disease with heart failure: Secondary | ICD-10-CM | POA: Diagnosis not present

## 2021-08-17 DIAGNOSIS — T826XXA Infection and inflammatory reaction due to cardiac valve prosthesis, initial encounter: Secondary | ICD-10-CM | POA: Diagnosis not present

## 2021-08-17 DIAGNOSIS — A4181 Sepsis due to Enterococcus: Secondary | ICD-10-CM | POA: Diagnosis not present

## 2021-08-18 ENCOUNTER — Ambulatory Visit
Admission: RE | Admit: 2021-08-18 | Discharge: 2021-08-18 | Disposition: A | Discharge status: Home / Self Care | Payer: Medicare Other | Source: Ambulatory Visit | Attending: Family Medicine | Admitting: Family Medicine

## 2021-08-18 ENCOUNTER — Other Ambulatory Visit: Payer: Self-pay

## 2021-08-18 DIAGNOSIS — J3489 Other specified disorders of nose and nasal sinuses: Secondary | ICD-10-CM | POA: Diagnosis not present

## 2021-08-18 DIAGNOSIS — R413 Other amnesia: Secondary | ICD-10-CM

## 2021-08-18 DIAGNOSIS — I614 Nontraumatic intracerebral hemorrhage in cerebellum: Secondary | ICD-10-CM | POA: Diagnosis not present

## 2021-08-18 DIAGNOSIS — H0520 Unspecified exophthalmos: Secondary | ICD-10-CM

## 2021-08-18 DIAGNOSIS — Z23 Encounter for immunization: Secondary | ICD-10-CM

## 2021-08-20 DIAGNOSIS — T826XXA Infection and inflammatory reaction due to cardiac valve prosthesis, initial encounter: Secondary | ICD-10-CM | POA: Diagnosis not present

## 2021-08-20 DIAGNOSIS — A419 Sepsis, unspecified organism: Secondary | ICD-10-CM | POA: Diagnosis not present

## 2021-08-20 DIAGNOSIS — J188 Other pneumonia, unspecified organism: Secondary | ICD-10-CM | POA: Diagnosis not present

## 2021-08-20 DIAGNOSIS — N390 Urinary tract infection, site not specified: Secondary | ICD-10-CM | POA: Diagnosis not present

## 2021-08-20 DIAGNOSIS — I110 Hypertensive heart disease with heart failure: Secondary | ICD-10-CM | POA: Diagnosis not present

## 2021-08-20 DIAGNOSIS — Z452 Encounter for adjustment and management of vascular access device: Secondary | ICD-10-CM | POA: Diagnosis not present

## 2021-08-20 DIAGNOSIS — A4181 Sepsis due to Enterococcus: Secondary | ICD-10-CM | POA: Diagnosis not present

## 2021-08-24 DIAGNOSIS — T826XXA Infection and inflammatory reaction due to cardiac valve prosthesis, initial encounter: Secondary | ICD-10-CM | POA: Diagnosis not present

## 2021-08-24 DIAGNOSIS — Z452 Encounter for adjustment and management of vascular access device: Secondary | ICD-10-CM | POA: Diagnosis not present

## 2021-08-24 DIAGNOSIS — A4181 Sepsis due to Enterococcus: Secondary | ICD-10-CM | POA: Diagnosis not present

## 2021-08-24 DIAGNOSIS — J188 Other pneumonia, unspecified organism: Secondary | ICD-10-CM | POA: Diagnosis not present

## 2021-08-24 DIAGNOSIS — A419 Sepsis, unspecified organism: Secondary | ICD-10-CM | POA: Diagnosis not present

## 2021-08-24 DIAGNOSIS — I110 Hypertensive heart disease with heart failure: Secondary | ICD-10-CM | POA: Diagnosis not present

## 2021-08-24 DIAGNOSIS — N390 Urinary tract infection, site not specified: Secondary | ICD-10-CM | POA: Diagnosis not present

## 2021-08-24 NOTE — Progress Notes (Signed)
Cardiology Office Note    Date:  09/01/2021   ID:  ELLIOT SIMONEAUX, DOB [birth year], MRN [id number]   PCP:  Maurice Small, MD (Inactive)   [place]  Cardiologist:  Fransico Him, MD   Advanced Practice Provider:  No care team member to display Electrophysiologist:  None   [phone]   Chief Complaint  Patient presents with   Follow-up     History of Present Illness:  [name] is a 85 y.o. female with a PMH of chronic atrial fibrillation, chronic diastolic CHF, non-obstructive CAD, Aortic stenosis s/p TAVR in 2018, HTN, HLD, OSA on CPAP, GERD, breast cancer, and obesity, echo 02/2021 LVEF 60 to 65% moderate LVH moderately elevated PA pressures, severe biatrial enlargement and aortic valve status post TAVR stable from previous.  She had worsening pulmonary hypertension and PFTs recommended VQ scan was negative for PE.  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  Cardiac CT showed evidence of large vegetation and LAA thrombus.   Patient comes in for f/u. Feels tired and short of breath with exertion. Has 17 steps at home and she gets short of breath-has been going on since before hospitalization. Had to cancel their trip back to [place]. Legs have been swollen.  Past Medical History:  Diagnosis Date   Anginal pain ([place])    Aortic stenosis    severe AS with moderate AR by echo with normal LVF   Arthritis    CAD in native artery, non obstructive cath 11/2016.  12/16/2016   Cancer ([place])    left breast mastectomy(cancer)-surgery only   Chronic diastolic congestive heart failure (HCC)    Complication of anesthesia    extremely claustophobic(only occurs with narcotic medications as stated per pt)   Diverticulosis    Dysrhythmia    Afib   Fibromyalgia    GERD (gastroesophageal reflux disease)    Hard of hearing    wears hearing aides    History of breast  cancer no recurrence   dx 2004   s/p  left breast mastectomy w/ node dissection/   no chemo or radiation   History of TIA (transient ischemic attack)    per scan--  no residual   Hyperlipidemia    Hypertension    Nocturia    OSA on CPAP     severe/  AHI 34/hr   Permanent atrial fibrillation ([place])    on Xarelto for CHADS2VASC score of 5   Pulmonary HTN ([place])    PASP 33mHg by echo 11/2016   Urge urinary incontinence    Wears glasses     Past Surgical History:  Procedure Laterality Date   ABDOMINOPLASTY  [place]  BREAST SURGERY Left 2004   mastectomy    CARDIAC CATHETERIZATION N/A 11/19/2016   Procedure: Right/Left Heart Cath and Coronary Angiography;  Surgeon: TTroy Sine MD;  [facility];  Service: Cardiovascular;  Laterality: N/A;   CARPAL TUNNEL RELEASE Right 07-08-2004   COLONOSCOPY WITH ESOPHAGOGASTRODUODENOSCOPY (EGD)  2006   EXPLANTATION BREAST IMPLANT AND DEBRIDEMENT Left 09/15/2005   INTERSTIM IMPLANT PLACEMENT N/A 08/22/2014   Procedure: IBarrie LymeIMPLANT STAGE ONE/TWO;  Surgeon: SReece Packer MD;  [facility]  Service: Urology;  Laterality: N/A;   JOINT REPLACEMENT Left 2017   knee replacement   MASTECTOMY     MASTECTOMY, MODIFIED RADICAL W/RECONSTRUCTION  09-16-2003   left breast W/ SLN DISSECTION (pt states had 13 operations for implant infection    REPLACE TISSUE EXPANDER LEFT BREAST AND TOTAL CAPSULECTOMY  08-05-2006   REVERSE SHOULDER ARTHROPLASTY Left 10/26/2019   Procedure: REVERSE SHOULDER ARTHROPLASTY;  Surgeon: Netta Cedars, MD;  Location: [facility];  Service: Orthopedics;  Laterality: Left;  with interscalene block   REVISION BREAST RECONSTRUCTION Left 11-11-2004   12-03-2004  DRAINAGE SEREMA LEFT CHEST   TEE WITHOUT CARDIOVERSION N/A 12/14/2016   Procedure: TRANSESOPHAGEAL ECHOCARDIOGRAM (TEE);  Surgeon: Burnell Blanks, MD;  Location: [facility];  Service: Open Heart Surgery;  Laterality:  N/A;   TEE WITHOUT CARDIOVERSION N/A 08/10/2021   Procedure: TRANSESOPHAGEAL ECHOCARDIOGRAM (TEE);  Surgeon: Buford Dresser, MD;  Location: [facility];  Service: Cardiovascular;  Laterality: N/A;   TOTAL HIP ARTHROPLASTY Left 07/17/2013   Procedure: LEFT TOTAL HIP ARTHROPLASTY ANTERIOR APPROACH;  Surgeon: Mauri Pole, MD;  Location: [facility];  Service: Orthopedics;  Laterality: Left;   TOTAL HIP ARTHROPLASTY Right 01/28/2015   Procedure: RIGHT TOTAL HIP ARTHROPLASTY ANTERIOR APPROACH;  Surgeon: Paralee Cancel, MD;  Location: [facility];  Service: Orthopedics;  Laterality: Right;   TOTAL KNEE ARTHROPLASTY Left 11/11/2015   Procedure: LEFT TOTAL KNEE ARTHROPLASTY;  Surgeon: Paralee Cancel, MD;  Location: [facility];  Service: Orthopedics;  Laterality: Left;   TOTAL SHOULDER ARTHROPLASTY  08/11/2012   Procedure: TOTAL SHOULDER ARTHROPLASTY;  Surgeon: Augustin Schooling, MD;  Location: [facility];  Service: Orthopedics;  Laterality: Right;  RIGHT  TOTAL SHOULDER  ARTHROPLASTY    TRANSCATHETER AORTIC VALVE REPLACEMENT, TRANSFEMORAL N/A 12/14/2016   Procedure: TRANSCATHETER AORTIC VALVE REPLACEMENT, TRANSFEMORAL;  Surgeon: Burnell Blanks, MD;  Location: [facility];  Service: Open Heart Surgery;  Laterality: N/A;   TRANSTHORACIC ECHOCARDIOGRAM  02-25-2014   moderate LVH/  ef 55-60%/  mod. to sev. calcification AV with mild to moderate AV stenosis/  mild to mod. AR and MR/ mild PR/   moderate to severe LAE   TUBAL LIGATION  1973    Current Medications: Current Meds  Medication Sig   alendronate (FOSAMAX) 70 MG tablet Take 70 mg by mouth every Saturday.   ampicillin IVPB Inject 8 mg into the vein daily. Administer as a continuous infusion Indication:  PV Endocarditis First Dose: Yes Last Day of Therapy:  09/16/21 Labs - Once weekly:  CBC/D and BMP, Labs - Every other week:  ESR and CRP Method of administration: Ambulatory Pump (Continuous Infusion) Method of administration may be changed at the discretion  of home infusion pharmacist based upon assessment of the patient and/or caregiver's ability to self-administer the medication ordered.   atorvastatin (LIPITOR) 80 MG tablet Take 1 tablet (80 mg total) by mouth daily.   cefTRIAXone (ROCEPHIN) IVPB Inject 2 g into the vein every 12 (twelve) hours. Indication:  PV Endocarditis First Dose: Yes Last Day of Therapy:  09/16/21 Labs - Once weekly:  CBC/D and BMP, Labs - Every other week:  ESR and CRP Method of administration: IV Push Method of administration may be changed at the discretion of home infusion pharmacist based upon assessment of the patient and/or caregiver's ability to self-administer the medication ordered.   DILT-XR 240 MG 24 hr capsule Take 240 mg by mouth every morning.   furosemide (LASIX) 20 MG tablet Take 20 mg by mouth daily.    hydrOXYzine (ATARAX/VISTARIL) 25 MG tablet Take 25 mg by mouth every 12 (twelve) hours as needed for anxiety.   losartan (COZAAR) 100 MG tablet  Take 1 tablet (100 mg total) by mouth daily.   Melatonin 10 MG TABS Take 10 mg by mouth at bedtime as needed (for sleep).    nitroGLYCERIN (NITROSTAT) 0.4 MG SL tablet Place 0.4 mg under the tongue every 5 (five) minutes as needed for chest pain.    Nutritional Supplements (VITAMIN D BOOSTER PO) Take 800 Units by mouth at bedtime. Kalcipos-d forte   omeprazole (PRILOSEC) 20 MG capsule Take 20 mg by mouth daily as needed (acid reflux/indigestion.).    predniSONE (DELTASONE) 5 MG tablet Take 5 mg by mouth daily with breakfast.   rivaroxaban (XARELTO) 20 MG TABS tablet Take 20 mg by mouth daily with supper.   triamcinolone ointment (KENALOG) 0.1 % Apply 1 application topically 2 (two) times daily as needed (affected areas of feet).      Allergies:   Beta adrenergic blockers, Other, Oxycodone, Sotalol hcl, Tramadol, Clonidine derivatives, Crestor [rosuvastatin calcium], Latex, Rosuvastatin calcium, Rosuvastatin, and Tape   Social History   Socioeconomic History    Marital status: Married    Spouse name: Not on file   Number of children: Not on file   Years of education: Not on file   Highest education level: Not on file  Occupational History   Not on file  Tobacco Use   Smoking status: Former    Packs/day: 0.25    Years: 8.00    Pack years: 2.00    Types: Cigarettes    Quit date: 10/18/1966    Years since quitting: 54.9   Smokeless tobacco: Never  Vaping Use   Vaping Use: Never used  Substance and Sexual Activity   Alcohol use: No   Drug use: No   Sexual activity: Yes  Other Topics Concern   Not on file  Social History Narrative   Not on file   Social Determinants of Health   Financial Resource Strain: Not on file  Food Insecurity: Not on file  Transportation Needs: Not on file  Physical Activity: Not on file  Stress: Not on file  Social Connections: Not on file     Family History:  The patient's  family history includes Heart disease in her father and mother.   ROS:   Please see the history of present illness.    ROS All other systems reviewed and are negative.   PHYSICAL EXAM:   VS:  BP (!) 148/80   Pulse 86   Ht '5\' 7"'  (1.702 m)   Wt 181 lb (82.1 kg)   SpO2 98%   BMI 28.35 kg/m   Physical Exam  GEN: Well nourished, well developed, in no acute distress  Neck: no JVD, carotid bruits, or masses Cardiac:irreg irreg 2/6 systolic murmur LSB  Respiratory:  clear to auscultation bilaterally, normal work of breathing GI: soft, nontender, nondistended, + BS Ext: plus 1 edema bilaterally otherwise without cyanosis, clubbing, Good distal pulses bilaterally Neuro:  Alert and Oriented x 3 Psych: euthymic mood, full affect  Wt Readings from Last 3 Encounters:  09/01/21 181 lb (82.1 kg)  08/10/21 185 lb 6.5 oz (84.1 kg)  02/02/21 186 lb 12.8 oz (84.7 kg)      Studies/Labs Reviewed:   EKG:  EKG is  ordered today.  The ekg ordered today demonstrates Afib with nonspecific ST changes  Recent Labs: 08/04/2021: B  Natriuretic Peptide 474.6 08/05/2021: ALT 15 08/10/2021: BUN 12; Creatinine, Ser 0.99; Hemoglobin 9.6; Magnesium 1.6; Platelets 209; Potassium 3.4; Sodium 138   Lipid Panel    Component Value  Date/Time   CHOL 148 02/02/2021 1113   TRIG 76 02/02/2021 1113   HDL 78 02/02/2021 1113   CHOLHDL 1.9 02/02/2021 1113   LDLCALC 55 02/02/2021 1113    Additional studies/ records that were reviewed today include:    TEE 08/10/21: LEFT VENTRICLE: EF = 55-60%.  No regional wall motion abnormalities.  RIGHT VENTRICLE: Normal size and function.    LEFT ATRIUM: No thrombus/mass. Severely dilated   LEFT ATRIAL APPENDAGE: No thrombus/mass.    RIGHT ATRIUM: No thrombus/mass. Severely dilated   AORTIC VALVE:  S/P TAVR with Edwards Sapien 3, 70m (date 12/14/2016). There is a vegetation seen on the prosthetic aortic valve, small in size, predominantly on the ventricular side of the aortic valve but small filament also see on aortic side of the valve. There is trivial regurgitation.   MITRAL VALVE:    Normal structure. Mild regurgitation. No vegetation.   TRICUSPID VALVE: Normal structure. MIld regurgitation. No vegetation.   PULMONIC VALVE: Grossly normal structure. Mild regurgitation. No apparent vegetation.   INTERATRIAL SEPTUM: No PFO or ASD seen by color Doppler.   PERICARDIUM: No effusion noted.   DESCENDING AORTA: Moderate diffuse plaque seen    CONCLUSION: Positive study for endocarditis of prosthetic aortic valve. Findings will be communicated to primary team.     Echocardiogram 08/05/21: 1. Left ventricular ejection fraction, by estimation, is 55 to 60%. The  left ventricle has normal function. The left ventricle has no regional  wall motion abnormalities. There is mild concentric left ventricular  hypertrophy. Left ventricular diastolic  parameters are indeterminate.   2. Right ventricular systolic function is normal. The right ventricular  size is normal.   3. Left atrial size  was severely dilated.   4. Right atrial size was severely dilated.   5. The mitral valve is normal in structure. No evidence of mitral valve  regurgitation. No evidence of mitral stenosis.   6. Tricuspid valve regurgitation is mild to moderate.   7. The aortic valve has been repaired/replaced. Aortic valve  regurgitation is not visualized. No aortic stenosis is present. There is a  26 mm Edwards Sapien prosthetic (TAVR) valve present in the aortic  position. Aortic valve mean gradient measures 19.4   mmHg. Aortic valve Vmax measures 2.78 m/s.   8. The inferior vena cava is normal in size with greater than 50%  respiratory variability, suggesting right atrial pressure of 3 mmHg.   Echocardiogram 02/2021: 1. Left ventricular ejection fraction, by estimation, is 60 to 65%. The  left ventricle has normal function. The left ventricle has no regional  wall motion abnormalities. There is moderate left ventricular hypertrophy.  Left ventricular diastolic  parameters are indeterminate.   2. Right ventricular systolic function is normal. The right ventricular  size is mildly enlarged. There is moderately elevated pulmonary artery  systolic pressure. The estimated right ventricular systolic pressure is  556.3mmHg.   3. Left atrial size was severely dilated.   4. Right atrial size was severely dilated.   5. The mitral valve is normal in structure. Trivial mitral valve  regurgitation.   6. The aortic valve has been repaired/replaced. Aortic valve  regurgitation is not visualized. There is a 26 mm Edwards Sapien  prosthetic (TAVR) valve present in the aortic position. Procedure Date:  12/14/16. Echo findings are consistent with normal  structure and function of the aortic valve prosthesis. MG 872mg, stable  from prior echo 08/22/19   Comparison(s): 08/22/19 EF 60-65%. AV 52m84m mean PG, 57m36m  peak PG.    Coronary CTAIMPRESSION: 1. There is a large vegetation of the prosthetic aortic valve without  abscess. Reaching out to primary team.   2.  There is a left atrial thrombus.   RECOMMENDATIONS:   Coronary artery calcium (CAC) score is a strong predictor of incident coronary heart disease (CHD) and provides predictive information beyond traditional risk factors. CAC scoring is reasonable to use in the decision to withhold, postpone, or initiate statin therapy in intermediate-risk or selected borderline-risk asymptomatic adults (age 15-75 years and LDL-C >=70 to <190 mg/dL) who do not have diabetes or established atherosclerotic cardiovascular disease (ASCVD).* In intermediate-risk (10-year ASCVD risk >=7.5% to <20%) adults or selected borderline-risk (10-year ASCVD risk >=5% to <7.5%) adults in whom a CAC score is measured for the purpose of making a treatment decision the following recommendations have been made:   If CAC = 0, it is reasonable to withhold statin therapy and reassess in 5 to 10 years, as long as higher risk conditions are absent (diabetes mellitus, family history of premature CHD in first degree relatives (males <55 years; females <65 years), cigarette smoking, LDL >=190 mg/dL or other independent risk factors).   If CAC is 1 to 99, it is reasonable to initiate statin therapy for patients >=17 years of age.   If CAC is >=100 or >=75th percentile, it is reasonable to initiate statin therapy at any age.   Cardiology referral should be considered for patients with CAC scores >=400 or >=75th percentile.   *2018 AHA/ACC/AACVPR/AAPA/ABC/ACPM/ADA/AGS/APhA/ASPC/NLA/PCNA Guideline on the Management of Blood Cholesterol: A Report of the American College of Cardiology/American Heart Association Task Force on Clinical Practice Guidelines. J Am Coll Cardiol. 2019;73(24):3168-3209.   Mahesh  Chandrasekhar  Risk Assessment/Calculations:    CHA2DS2-VASc Score = 7   This indicates a 11.2% annual risk of stroke. The patient's score is based upon: CHF History: 1 HTN  History: 1 Diabetes History: 1 Stroke History: 0 Vascular Disease History: 1 Age Score: 2 Gender Score: 1        ASSESSMENT:    1. Severe sepsis ([place])   2. Prosthetic valve endocarditis, subsequent encounter   3. Permanent atrial fibrillation ([place])   4. Essential hypertension   5. Coronary artery disease involving native coronary artery of native heart without angina pectoris   6. Hyperlipidemia, unspecified hyperlipidemia type   7. OSA (obstructive sleep apnea)      PLAN:  In order of problems listed above:  Endocarditis of TAVR valve large vegetation greater than 1 cm.  Felt to be high risk and would not tolerate SAVR.  Antibiotics per ID suspect she will need chronic suppression. Currently tolerating IV antibiotics  Permanent atrial fibrillation LA thrombus on Xarelto no plans for cardioversion has beta-blocker allergy rates controlled with diltiazem  Hypertension BP overall controlled  LE edema- new for her-increase lasix 40 mg daily x 3 days then back to 20 mg daily. She was givne Kdur 20 meq by PCP but not taking yet-I asked her to take with the extra lasix and will check labs today to see if she needs it daily.  Nonobstructive CAD-no angina  HLD LDL 57 on Zetia  OSA on CPAP  Shared Decision Making/Informed Consent        Medication Adjustments/Labs and Tests Ordered: Current medicines are reviewed at length with the patient today.  Concerns regarding medicines are outlined above.  Medication changes, Labs and Tests ordered today are listed in the Patient Instructions below. Patient Instructions  Medication  Instructions:  Your physician has recommended you make the following change in your medication:  TAKE AN EXTRA LASIX FOR 3 DAYS   TAKE POTASSIUM 20 MEQ ALONG WITH LASIX FOR 3 DAYS  *If you need a refill on your cardiac medications before your next appointment, please call your pharmacy*   Lab Work: TODAY: BMET, CBC If you have labs (blood work) drawn  today and your tests are completely normal, you will receive your results only by: [place] (if you have MyChart) OR A paper copy in the mail If you have any lab test that is abnormal or we need to change your treatment, we will call you to review the results.   Testing/Procedures: NONE   Follow-Up: At [facility], you and your health needs are our priority.  As part of our continuing mission to provide you with exceptional heart care, we have created designated Provider Care Teams.  These Care Teams include your primary Cardiologist (physician) and Advanced Practice Providers (APPs -  Physician Assistants and Nurse Practitioners) who all work together to provide you with the care you need, when you need it.  Your next appointment:   6-8 week(s)  The format for your next appointment:   In Person  Provider:   Fransico Him, MD {  Two Gram Sodium Diet 2000 mg  What is Sodium? Sodium is a mineral found naturally in many foods. The most significant source of sodium in the diet is table salt, which is about 40% sodium.  Processed, convenience, and preserved foods also contain a large amount of sodium.  The body needs only 500 mg of sodium daily to function,  A normal diet provides more than enough sodium even if you do not use salt.  Why Limit Sodium? A build up of sodium in the body can cause thirst, increased blood pressure, shortness of breath, and water retention.  Decreasing sodium in the diet can reduce edema and risk of heart attack or stroke associated with high blood pressure.  Keep in mind that there are many other factors involved in these health problems.  Heredity, obesity, lack of exercise, cigarette smoking, stress and what you eat all play a role.  General Guidelines: Do not add salt at the table or in cooking.  One teaspoon of salt contains over 2 grams of sodium. Read food labels Avoid processed and convenience foods Ask your dietitian before eating any foods not  dicussed in the menu planning guidelines Consult your physician if you wish to use a salt substitute or a sodium containing medication such as antacids.  Limit milk and milk products to 16 oz (2 cups) per day.  Shopping Hints: READ LABELS!! "Dietetic" does not necessarily mean low sodium. Salt and other sodium ingredients are often added to foods during processing.    Menu Planning Guidelines Food Group Choose More Often Avoid  Beverages (see also the milk group All fruit juices, low-sodium, salt-free vegetables juices, low-sodium carbonated beverages Regular vegetable or tomato juices, commercially softened water used for drinking or cooking  Breads and Cereals Enriched white, wheat, rye and pumpernickel bread, hard rolls and dinner rolls; muffins, cornbread and waffles; most dry cereals, cooked cereal without added salt; unsalted crackers and breadsticks; low sodium or homemade bread crumbs Bread, rolls and crackers with salted tops; quick breads; instant hot cereals; pancakes; commercial bread stuffing; self-rising flower and biscuit mixes; regular bread crumbs or cracker crumbs  Desserts and Sweets Desserts and sweets mad with mild should be within allowance  Instant pudding mixes and cake mixes  Fats Butter or margarine; vegetable oils; unsalted salad dressings, regular salad dressings limited to 1 Tbs; light, sour and heavy cream Regular salad dressings containing bacon fat, bacon bits, and salt pork; snack dips made with instant soup mixes or processed cheese; salted nuts  Fruits Most fresh, frozen and canned fruits Fruits processed with salt or sodium-containing ingredient (some dried fruits are processed with sodium sulfites        Vegetables Fresh, frozen vegetables and low- sodium canned vegetables Regular canned vegetables, sauerkraut, pickled vegetables, and others prepared in brine; frozen vegetables in sauces; vegetables seasoned with ham, bacon or salt pork  Condiments, Sauces,  Miscellaneous  Salt substitute with physician's approval; pepper, herbs, spices; vinegar, lemon or lime juice; hot pepper sauce; garlic powder, onion powder, low sodium soy sauce (1 Tbs.); low sodium condiments (ketchup, chili sauce, mustard) in limited amounts (1 tsp.) fresh ground horseradish; unsalted tortilla chips, pretzels, potato chips, popcorn, salsa (1/4 cup) Any seasoning made with salt including garlic salt, celery salt, onion salt, and seasoned salt; sea salt, rock salt, kosher salt; meat tenderizers; monosodium glutamate; mustard, regular soy sauce, barbecue, sauce, chili sauce, teriyaki sauce, steak sauce, Worcestershire sauce, and most flavored vinegars; canned gravy and mixes; regular condiments; salted snack foods, olives, picles, relish, horseradish sauce, catsup   Food preparation: Try these seasonings Meats:    Pork Sage, onion Serve with applesauce  Chicken Poultry seasoning, thyme, parsley Serve with cranberry sauce  Lamb Curry powder, rosemary, garlic, thyme Serve with mint sauce or jelly  Veal Marjoram, basil Serve with current jelly, cranberry sauce  Beef Pepper, bay leaf Serve with dry mustard, unsalted chive butter  Fish [place] leaf, dill Serve with unsalted lemon butter, unsalted parsley butter  Vegetables:    Asparagus Lemon juice   Broccoli Lemon juice   Carrots Mustard dressing parsley, mint, nutmeg, glazed with unsalted butter and sugar   Green beans Marjoram, lemon juice, nutmeg,dill seed   Tomatoes Basil, marjoram, onion   Spice /blend for [employer]" 4 tsp ground thyme 1 tsp ground sage 3 tsp ground rosemary 4 tsp ground marjoram   Test your knowledge A product that says "Salt Free" may still contain sodium. True or False Garlic Powder and Hot Pepper Sauce an be used as alternative seasonings.True or False Processed foods have more sodium than fresh foods.  True or False Canned Vegetables have less sodium than froze True or False   WAYS TO DECREASE YOUR  SODIUM INTAKE Avoid the use of added salt in cooking and at the table.  Table salt (and other prepared seasonings which contain salt) is probably one of the greatest sources of sodium in the diet.  Unsalted foods can gain flavor from the sweet, sour, and butter taste sensations of herbs and spices.  Instead of using salt for seasoning, try the following seasonings with the foods listed.  Remember: how you use them to enhance natural food flavors is limited only by your creativity... Allspice-Meat, fish, eggs, fruit, peas, red and yellow vegetables Almond Extract-Fruit baked goods Anise Seed-Sweet breads, fruit, carrots, beets, cottage cheese, cookies (tastes like licorice) Basil-Meat, fish, eggs, vegetables, rice, vegetables salads, soups, sauces Bay Leaf-Meat, fish, stews, poultry Burnet-Salad, vegetables (cucumber-like flavor) Caraway Seed-Bread, cookies, cottage cheese, meat, vegetables, cheese, rice Cardamon-Baked goods, fruit, soups Celery Powder or seed-Salads, salad dressings, sauces, meatloaf, soup, bread.Do not use  celery salt Chervil-Meats, salads, fish, eggs, vegetables, cottage cheese (parsley-like flavor) Chili Power-Meatloaf, chicken cheese, corn,  eggplant, egg dishes Chives-Salads cottage cheese, egg dishes, soups, vegetables, sauces Cilantro-Salsa, casseroles Cinnamon-Baked goods, fruit, pork, lamb, chicken, carrots Cloves-Fruit, baked goods, fish, pot roast, green beans, beets, carrots Coriander-Pastry, cookies, meat, salads, cheese (lemon-orange flavor) Cumin-Meatloaf, fish,cheese, eggs, cabbage,fruit pie (caraway flavor) [employer], fruit, eggs, fish, poultry, cottage cheese, vegetables Dill Seed-Meat, cottage cheese, poultry, vegetables, fish, salads, bread Fennel Seed-Bread, cookies, apples, pork, eggs, fish, beets, cabbage, cheese, Licorice-like flavor Garlic-(buds or powder) Salads, meat, poultry, fish, bread, butter, vegetables, potatoes.Do not  use garlic  salt Ginger-Fruit, vegetables, baked goods, meat, fish, poultry Horseradish Root-Meet, vegetables, butter Lemon Juice or Extract-Vegetables, fruit, tea, baked goods, fish salads Mace-Baked goods fruit, vegetables, fish, poultry (taste like nutmeg) Maple Extract-Syrups Marjoram-Meat, chicken, fish, vegetables, breads, green salads (taste like Sage) Mint-Tea, lamb, sherbet, vegetables, desserts, carrots, cabbage Mustard, Dry or Seed-Cheese, eggs, meats, vegetables, poultry Nutmeg-Baked goods, fruit, chicken, eggs, vegetables, desserts Onion Powder-Meat, fish, poultry, vegetables, cheese, eggs, bread, rice salads (Do not use   Onion salt) Orange Extract-Desserts, baked goods Oregano-Pasta, eggs, cheese, onions, pork, lamb, fish, chicken, vegetables, green salads Paprika-Meat, fish, poultry, eggs, cheese, vegetables Parsley Flakes-Butter, vegetables, meat fish, poultry, eggs, bread, salads (certain forms may   Contain sodium Pepper-Meat fish, poultry, vegetables, eggs Peppermint Extract-Desserts, baked goods Poppy Seed-Eggs, bread, cheese, fruit dressings, baked goods, noodles, vegetables, cottage  [employer], poultry, meat, fish, cauliflower, turnips,eggs bread Saffron-Rice, bread, veal, chicken, fish, eggs Sage-Meat, fish, poultry, onions, eggplant, tomateos, pork, stews Savory-Eggs, salads, poultry, meat, rice, vegetables, soups, pork Tarragon-Meat, poultry, fish, eggs, butter, vegetables (licorice-like flavor)  Thyme-Meat, poultry, fish, eggs, vegetables, (clover-like flavor), sauces, soups Tumeric-Salads, butter, eggs, fish, rice, vegetables (saffron-like flavor) Vanilla Extract-Baked goods, candy Vinegar-Salads, vegetables, meat marinades Walnut Extract-baked goods, candy   2. Choose your Foods Wisely   The following is a list of foods to avoid which are high in sodium:  Meats-Avoid all smoked, canned, salt cured, dried and kosher meat  and fish as well as Anchovies   [employer] Rx meats:Bologna, Liverwurst, Pastrami Canned meat or fish  Marinated herring Caviar    Pepperoni Corned Beef   Pizza Dried chipped beef  Salami Frozen breaded fish or meat Salt pork Frankfurters or hot dogs  Sardines Gefilte fish   Sausage Ham (boiled ham, Proscuitto Smoked butt    spiced ham)   Spam      TV Dinners Vegetables Canned vegetables (Regular) Relish Canned mushrooms  Sauerkraut Olives    Tomato juice Pickles  Bakery and Dessert Products Canned puddings  Cream pies Cheesecake   Decorated cakes Cookies  Beverages/Juices Tomato juice, regular  Gatorade   V-8 vegetable juice, regular  Breads and Cereals Biscuit mixes   Salted potato chips, corn chips, pretzels Bread stuffing mixes  Salted crackers and rolls Pancake and waffle mixes Self-rising flour  Seasonings Accent    Meat sauces Barbecue sauce  Meat tenderizer Catsup    Monosodium glutamate (MSG) Celery salt   Onion salt Chili sauce   Prepared mustard Garlic salt   Salt, seasoned salt, sea salt Gravy mixes   Soy sauce Horseradish   Steak sauce Ketchup   Tartar sauce Lite salt    Teriyaki sauce Marinade mixes   Worcestershire sauce  Others Baking powder   Cocoa and cocoa mixes Baking soda   Commercial casserole mixes Candy-caramels, chocolate  Dehydrated soups    Bars, fudge,nougats  Instant rice and pasta mixes Canned broth or soup  Maraschino cherries Cheese, aged and processed  cheese and cheese spreads  Learning Assessment Quiz  Indicated T (for True) or F (for False) for each of the following statements:  _____ Fresh fruits and vegetables and unprocessed grains are generally low in sodium _____ Water may contain a considerable amount of sodium, depending on the source _____ You can always tell if a food is high in sodium by tasting it _____ Certain laxatives my be high in sodium and should be avoided unless prescribed   by a physician or  pharmacist _____ Salt substitutes may be used freely by anyone on a sodium restricted diet _____ Sodium is present in table salt, food additives and as a natural component of   most foods _____ Table salt is approximately 90% sodium _____ Limiting sodium intake may help prevent excess fluid accumulation in the body _____ On a sodium-restricted diet, seasonings such as bouillon soy sauce, and    cooking wine should be used in place of table salt _____ On an ingredient list, a product which lists monosodium glutamate as the first   ingredient is an appropriate food to include on a low sodium diet  Circle the best answer(s) to the following statements (Hint: there may be more than one correct answer)  11. On a low-sodium diet, some acceptable snack items are:    A. Olives  F. Bean dip   K. Grapefruit juice    B. Salted Pretzels G. Commercial Popcorn   L. Canned peaches    C. Carrot Sticks  H. Bouillon   M. Unsalted nuts   D. [place] fries  I. Peanut butter crackers N. Salami   E. Sweet pickles J. Tomato Juice   O. Pizza  12.  Seasonings that may be used freely on a reduced - sodium diet include   A. Lemon wedges F.Monosodium glutamate K. Celery seed    B.Soysauce   G. Pepper   L. Mustard powder   C. Sea salt  H. Cooking wine  M. Onion flakes   D. Vinegar  E. Prepared horseradish N. Salsa   E. Sage   J. Worcestershire sauce  O. Chutney      Signed, Ermalinda Barrios, PA-C  09/01/2021 9:25 AM    [facility] [employer], [place], [place]  [address] Phone: [phone]; Fax: [phone]

## 2021-08-26 DIAGNOSIS — T826XXA Infection and inflammatory reaction due to cardiac valve prosthesis, initial encounter: Secondary | ICD-10-CM | POA: Diagnosis not present

## 2021-08-26 DIAGNOSIS — I272 Pulmonary hypertension, unspecified: Secondary | ICD-10-CM | POA: Diagnosis not present

## 2021-08-26 DIAGNOSIS — R413 Other amnesia: Secondary | ICD-10-CM | POA: Diagnosis not present

## 2021-08-26 DIAGNOSIS — I4891 Unspecified atrial fibrillation: Secondary | ICD-10-CM | POA: Diagnosis not present

## 2021-08-26 DIAGNOSIS — M25562 Pain in left knee: Secondary | ICD-10-CM | POA: Diagnosis not present

## 2021-08-26 DIAGNOSIS — Z79899 Other long term (current) drug therapy: Secondary | ICD-10-CM | POA: Diagnosis not present

## 2021-08-26 DIAGNOSIS — R5381 Other malaise: Secondary | ICD-10-CM | POA: Diagnosis not present

## 2021-08-26 DIAGNOSIS — I358 Other nonrheumatic aortic valve disorders: Secondary | ICD-10-CM | POA: Diagnosis not present

## 2021-08-26 DIAGNOSIS — D649 Anemia, unspecified: Secondary | ICD-10-CM | POA: Diagnosis not present

## 2021-08-26 DIAGNOSIS — Z952 Presence of prosthetic heart valve: Secondary | ICD-10-CM | POA: Diagnosis not present

## 2021-08-26 DIAGNOSIS — D6869 Other thrombophilia: Secondary | ICD-10-CM | POA: Diagnosis not present

## 2021-08-27 DIAGNOSIS — T826XXA Infection and inflammatory reaction due to cardiac valve prosthesis, initial encounter: Secondary | ICD-10-CM | POA: Diagnosis not present

## 2021-08-27 DIAGNOSIS — K5730 Diverticulosis of large intestine without perforation or abscess without bleeding: Secondary | ICD-10-CM | POA: Diagnosis not present

## 2021-08-27 DIAGNOSIS — J188 Other pneumonia, unspecified organism: Secondary | ICD-10-CM | POA: Diagnosis not present

## 2021-08-27 DIAGNOSIS — A4181 Sepsis due to Enterococcus: Secondary | ICD-10-CM | POA: Diagnosis not present

## 2021-08-27 DIAGNOSIS — N390 Urinary tract infection, site not specified: Secondary | ICD-10-CM | POA: Diagnosis not present

## 2021-08-27 DIAGNOSIS — Z86010 Personal history of colonic polyps: Secondary | ICD-10-CM | POA: Diagnosis not present

## 2021-08-27 DIAGNOSIS — I4891 Unspecified atrial fibrillation: Secondary | ICD-10-CM | POA: Diagnosis not present

## 2021-08-27 DIAGNOSIS — I110 Hypertensive heart disease with heart failure: Secondary | ICD-10-CM | POA: Diagnosis not present

## 2021-08-27 DIAGNOSIS — Z452 Encounter for adjustment and management of vascular access device: Secondary | ICD-10-CM | POA: Diagnosis not present

## 2021-08-31 ENCOUNTER — Telehealth: Payer: Self-pay

## 2021-08-31 DIAGNOSIS — Z452 Encounter for adjustment and management of vascular access device: Secondary | ICD-10-CM | POA: Diagnosis not present

## 2021-08-31 DIAGNOSIS — I110 Hypertensive heart disease with heart failure: Secondary | ICD-10-CM | POA: Diagnosis not present

## 2021-08-31 DIAGNOSIS — A4181 Sepsis due to Enterococcus: Secondary | ICD-10-CM | POA: Diagnosis not present

## 2021-08-31 DIAGNOSIS — T826XXA Infection and inflammatory reaction due to cardiac valve prosthesis, initial encounter: Secondary | ICD-10-CM | POA: Diagnosis not present

## 2021-08-31 DIAGNOSIS — J188 Other pneumonia, unspecified organism: Secondary | ICD-10-CM | POA: Diagnosis not present

## 2021-08-31 DIAGNOSIS — N390 Urinary tract infection, site not specified: Secondary | ICD-10-CM | POA: Diagnosis not present

## 2021-08-31 NOTE — Telephone Encounter (Signed)
Advised patient of future appt info

## 2021-09-01 ENCOUNTER — Ambulatory Visit (INDEPENDENT_AMBULATORY_CARE_PROVIDER_SITE_OTHER): Payer: Medicare Other | Admitting: Physician Assistant

## 2021-09-01 ENCOUNTER — Encounter: Payer: Self-pay | Admitting: Physician Assistant

## 2021-09-01 ENCOUNTER — Other Ambulatory Visit: Payer: Self-pay

## 2021-09-01 VITALS — BP 148/80 | HR 86 | Ht 67.0 in | Wt 181.0 lb

## 2021-09-01 DIAGNOSIS — G4733 Obstructive sleep apnea (adult) (pediatric): Secondary | ICD-10-CM | POA: Diagnosis not present

## 2021-09-01 DIAGNOSIS — E785 Hyperlipidemia, unspecified: Secondary | ICD-10-CM

## 2021-09-01 DIAGNOSIS — I2510 Atherosclerotic heart disease of native coronary artery without angina pectoris: Secondary | ICD-10-CM

## 2021-09-01 DIAGNOSIS — I4821 Permanent atrial fibrillation: Secondary | ICD-10-CM

## 2021-09-01 DIAGNOSIS — I38 Endocarditis, valve unspecified: Secondary | ICD-10-CM | POA: Diagnosis not present

## 2021-09-01 DIAGNOSIS — R6520 Severe sepsis without septic shock: Secondary | ICD-10-CM | POA: Diagnosis not present

## 2021-09-01 DIAGNOSIS — I10 Essential (primary) hypertension: Secondary | ICD-10-CM | POA: Diagnosis not present

## 2021-09-01 DIAGNOSIS — A419 Sepsis, unspecified organism: Secondary | ICD-10-CM

## 2021-09-01 DIAGNOSIS — T826XXD Infection and inflammatory reaction due to cardiac valve prosthesis, subsequent encounter: Secondary | ICD-10-CM

## 2021-09-01 LAB — BASIC METABOLIC PANEL
BUN/Creatinine Ratio: 27 (ref 12–28)
BUN: 22 mg/dL (ref 8–27)
CO2: 22 mmol/L (ref 20–29)
Calcium: 9.8 mg/dL (ref 8.7–10.3)
Chloride: 103 mmol/L (ref 96–106)
Creatinine, Ser: 0.83 mg/dL (ref 0.57–1.00)
Glucose: 88 mg/dL (ref 70–99)
Potassium: 3.9 mmol/L (ref 3.5–5.2)
Sodium: 141 mmol/L (ref 134–144)
eGFR: 70 mL/min/{1.73_m2} (ref >59)

## 2021-09-01 LAB — CBC
Hematocrit: 29.6 % — ABNORMAL LOW (ref 34.0–46.6)
Hemoglobin: 9.5 g/dL — ABNORMAL LOW (ref 11.1–15.9)
MCH: 29.1 pg (ref 26.6–33.0)
MCHC: 32.1 g/dL (ref 31.5–35.7)
MCV: 91 fL (ref 79–97)
Platelets: 159 10*3/uL (ref 150–450)
RBC: 3.26 x10E6/uL — ABNORMAL LOW (ref 3.77–5.28)
RDW: 13.8 % (ref 11.7–15.4)
WBC: 8.3 10*3/uL (ref 3.4–10.8)

## 2021-09-01 NOTE — Addendum Note (Signed)
Addended by: Jacinta Shoe on: 09/01/2021 09:40 AM   Modules accepted: Orders

## 2021-09-01 NOTE — Patient Instructions (Addendum)
Medication Instructions:  Your physician has recommended you make the following change in your medication:  TAKE AN EXTRA LASIX FOR 3 DAYS   TAKE POTASSIUM 20 MEQ ALONG WITH LASIX FOR 3 DAYS  *If you need a refill on your cardiac medications before your next appointment, please call your pharmacy*   Lab Work: TODAY: BMET, CBC If you have labs (blood work) drawn today and your tests are completely normal, you will receive your results only by: [place] (if you have MyChart) OR A paper copy in the mail If you have any lab test that is abnormal or we need to change your treatment, we will call you to review the results.   Testing/Procedures: NONE   Follow-Up: At [facility], you and your health needs are our priority.  As part of our continuing mission to provide you with exceptional heart care, we have created designated Provider Care Teams.  These Care Teams include your primary Cardiologist (physician) and Advanced Practice Providers (APPs -  Physician Assistants and Nurse Practitioners) who all work together to provide you with the care you need, when you need it.  Your next appointment:   6-8 week(s)  The format for your next appointment:   In Person  Provider:   Fransico Him, MD {  Two Gram Sodium Diet 2000 mg  What is Sodium? Sodium is a mineral found naturally in many foods. The most significant source of sodium in the diet is table salt, which is about 40% sodium.  Processed, convenience, and preserved foods also contain a large amount of sodium.  The body needs only 500 mg of sodium daily to function,  A normal diet provides more than enough sodium even if you do not use salt.  Why Limit Sodium? A build up of sodium in the body can cause thirst, increased blood pressure, shortness of breath, and water retention.  Decreasing sodium in the diet can reduce edema and risk of heart attack or stroke associated with high blood pressure.  Keep in mind that there are many  other factors involved in these health problems.  Heredity, obesity, lack of exercise, cigarette smoking, stress and what you eat all play a role.  General Guidelines: Do not add salt at the table or in cooking.  One teaspoon of salt contains over 2 grams of sodium. Read food labels Avoid processed and convenience foods Ask your dietitian before eating any foods not dicussed in the menu planning guidelines Consult your physician if you wish to use a salt substitute or a sodium containing medication such as antacids.  Limit milk and milk products to 16 oz (2 cups) per day.  Shopping Hints: READ LABELS!! "Dietetic" does not necessarily mean low sodium. Salt and other sodium ingredients are often added to foods during processing.    Menu Planning Guidelines Food Group Choose More Often Avoid  Beverages (see also the milk group All fruit juices, low-sodium, salt-free vegetables juices, low-sodium carbonated beverages Regular vegetable or tomato juices, commercially softened water used for drinking or cooking  Breads and Cereals Enriched white, wheat, rye and pumpernickel bread, hard rolls and dinner rolls; muffins, cornbread and waffles; most dry cereals, cooked cereal without added salt; unsalted crackers and breadsticks; low sodium or homemade bread crumbs Bread, rolls and crackers with salted tops; quick breads; instant hot cereals; pancakes; commercial bread stuffing; self-rising flower and biscuit mixes; regular bread crumbs or cracker crumbs  Desserts and Sweets Desserts and sweets mad with mild should be within allowance  Instant pudding mixes and cake mixes  Fats Butter or margarine; vegetable oils; unsalted salad dressings, regular salad dressings limited to 1 Tbs; light, sour and heavy cream Regular salad dressings containing bacon fat, bacon bits, and salt pork; snack dips made with instant soup mixes or processed cheese; salted nuts  Fruits Most fresh, frozen and canned fruits Fruits  processed with salt or sodium-containing ingredient (some dried fruits are processed with sodium sulfites        Vegetables Fresh, frozen vegetables and low- sodium canned vegetables Regular canned vegetables, sauerkraut, pickled vegetables, and others prepared in brine; frozen vegetables in sauces; vegetables seasoned with ham, bacon or salt pork  Condiments, Sauces, Miscellaneous  Salt substitute with physician's approval; pepper, herbs, spices; vinegar, lemon or lime juice; hot pepper sauce; garlic powder, onion powder, low sodium soy sauce (1 Tbs.); low sodium condiments (ketchup, chili sauce, mustard) in limited amounts (1 tsp.) fresh ground horseradish; unsalted tortilla chips, pretzels, potato chips, popcorn, salsa (1/4 cup) Any seasoning made with salt including garlic salt, celery salt, onion salt, and seasoned salt; sea salt, rock salt, kosher salt; meat tenderizers; monosodium glutamate; mustard, regular soy sauce, barbecue, sauce, chili sauce, teriyaki sauce, steak sauce, Worcestershire sauce, and most flavored vinegars; canned gravy and mixes; regular condiments; salted snack foods, olives, picles, relish, horseradish sauce, catsup   Food preparation: Try these seasonings Meats:    Pork Sage, onion Serve with applesauce  Chicken Poultry seasoning, thyme, parsley Serve with cranberry sauce  Lamb Curry powder, rosemary, garlic, thyme Serve with mint sauce or jelly  Veal Marjoram, basil Serve with current jelly, cranberry sauce  Beef Pepper, bay leaf Serve with dry mustard, unsalted chive butter  Fish Bay leaf, dill Serve with unsalted lemon butter, unsalted parsley butter  Vegetables:    Asparagus Lemon juice   Broccoli Lemon juice   Carrots Mustard dressing parsley, mint, nutmeg, glazed with unsalted butter and sugar   Green beans Marjoram, lemon juice, nutmeg,dill seed   Tomatoes Basil, marjoram, onion   Spice /blend for [employer]" 4 tsp ground thyme 1 tsp ground sage 3 tsp  ground rosemary 4 tsp ground marjoram   Test your knowledge A product that says "Salt Free" may still contain sodium. True or False Garlic Powder and Hot Pepper Sauce an be used as alternative seasonings.True or False Processed foods have more sodium than fresh foods.  True or False Canned Vegetables have less sodium than froze True or False   WAYS TO DECREASE YOUR SODIUM INTAKE Avoid the use of added salt in cooking and at the table.  Table salt (and other prepared seasonings which contain salt) is probably one of the greatest sources of sodium in the diet.  Unsalted foods can gain flavor from the sweet, sour, and butter taste sensations of herbs and spices.  Instead of using salt for seasoning, try the following seasonings with the foods listed.  Remember: how you use them to enhance natural food flavors is limited only by your creativity... Allspice-Meat, fish, eggs, fruit, peas, red and yellow vegetables Almond Extract-Fruit baked goods Anise Seed-Sweet breads, fruit, carrots, beets, cottage cheese, cookies (tastes like licorice) Basil-Meat, fish, eggs, vegetables, rice, vegetables salads, soups, sauces Bay Leaf-Meat, fish, stews, poultry Burnet-Salad, vegetables (cucumber-like flavor) Caraway Seed-Bread, cookies, cottage cheese, meat, vegetables, cheese, rice Cardamon-Baked goods, fruit, soups Celery Powder or seed-Salads, salad dressings, sauces, meatloaf, soup, bread.Do not use  celery salt Chervil-Meats, salads, fish, eggs, vegetables, cottage cheese (parsley-like flavor) Chili Power-Meatloaf, chicken cheese, corn,  eggplant, egg dishes Chives-Salads cottage cheese, egg dishes, soups, vegetables, sauces Cilantro-Salsa, casseroles Cinnamon-Baked goods, fruit, pork, lamb, chicken, carrots Cloves-Fruit, baked goods, fish, pot roast, green beans, beets, carrots Coriander-Pastry, cookies, meat, salads, cheese (lemon-orange flavor) Cumin-Meatloaf, fish,cheese, eggs, cabbage,fruit pie  (caraway flavor) [employer], fruit, eggs, fish, poultry, cottage cheese, vegetables Dill Seed-Meat, cottage cheese, poultry, vegetables, fish, salads, bread Fennel Seed-Bread, cookies, apples, pork, eggs, fish, beets, cabbage, cheese, Licorice-like flavor Garlic-(buds or powder) Salads, meat, poultry, fish, bread, butter, vegetables, potatoes.Do not  use garlic salt Ginger-Fruit, vegetables, baked goods, meat, fish, poultry Horseradish Root-Meet, vegetables, butter Lemon Juice or Extract-Vegetables, fruit, tea, baked goods, fish salads Mace-Baked goods fruit, vegetables, fish, poultry (taste like nutmeg) Maple Extract-Syrups Marjoram-Meat, chicken, fish, vegetables, breads, green salads (taste like Sage) Mint-Tea, lamb, sherbet, vegetables, desserts, carrots, cabbage Mustard, Dry or Seed-Cheese, eggs, meats, vegetables, poultry Nutmeg-Baked goods, fruit, chicken, eggs, vegetables, desserts Onion Powder-Meat, fish, poultry, vegetables, cheese, eggs, bread, rice salads (Do not use   Onion salt) Orange Extract-Desserts, baked goods Oregano-Pasta, eggs, cheese, onions, pork, lamb, fish, chicken, vegetables, green salads Paprika-Meat, fish, poultry, eggs, cheese, vegetables Parsley Flakes-Butter, vegetables, meat fish, poultry, eggs, bread, salads (certain forms may   Contain sodium Pepper-Meat fish, poultry, vegetables, eggs Peppermint Extract-Desserts, baked goods Poppy Seed-Eggs, bread, cheese, fruit dressings, baked goods, noodles, vegetables, cottage  [employer], poultry, meat, fish, cauliflower, turnips,eggs bread Saffron-Rice, bread, veal, chicken, fish, eggs Sage-Meat, fish, poultry, onions, eggplant, tomateos, pork, stews Savory-Eggs, salads, poultry, meat, rice, vegetables, soups, pork Tarragon-Meat, poultry, fish, eggs, butter, vegetables (licorice-like flavor)  Thyme-Meat, poultry, fish, eggs, vegetables, (clover-like flavor),  sauces, soups Tumeric-Salads, butter, eggs, fish, rice, vegetables (saffron-like flavor) Vanilla Extract-Baked goods, candy Vinegar-Salads, vegetables, meat marinades Walnut Extract-baked goods, candy   2. Choose your Foods Wisely   The following is a list of foods to avoid which are high in sodium:  Meats-Avoid all smoked, canned, salt cured, dried and kosher meat and fish as well as Anchovies   [employer] Rx meats:Bologna, Liverwurst, Pastrami Canned meat or fish  Marinated herring Caviar    Pepperoni Corned Beef   Pizza Dried chipped beef  Salami Frozen breaded fish or meat Salt pork Frankfurters or hot dogs  Sardines Gefilte fish   Sausage Ham (boiled ham, Proscuitto Smoked butt    spiced ham)   Spam      TV Dinners Vegetables Canned vegetables (Regular) Relish Canned mushrooms  Sauerkraut Olives    Tomato juice Pickles  Bakery and Dessert Products Canned puddings  Cream pies Cheesecake   Decorated cakes Cookies  Beverages/Juices Tomato juice, regular  Gatorade   V-8 vegetable juice, regular  Breads and Cereals Biscuit mixes   Salted potato chips, corn chips, pretzels Bread stuffing mixes  Salted crackers and rolls Pancake and waffle mixes Self-rising flour  Seasonings Accent    Meat sauces Barbecue sauce  Meat tenderizer Catsup    Monosodium glutamate (MSG) Celery salt   Onion salt Chili sauce   Prepared mustard Garlic salt   Salt, seasoned salt, sea salt Gravy mixes   Soy sauce Horseradish   Steak sauce Ketchup   Tartar sauce Lite salt    Teriyaki sauce Marinade mixes   Worcestershire sauce  Others Baking powder   Cocoa and cocoa mixes Baking soda   Commercial casserole mixes Candy-caramels, chocolate  Dehydrated soups    Bars, fudge,nougats  Instant rice and pasta mixes Canned broth or soup  Maraschino cherries Cheese, aged and processed  cheese and cheese spreads  Learning Assessment Quiz  Indicated T (for True) or F (for False) for  each of the following statements:  _____ Fresh fruits and vegetables and unprocessed grains are generally low in sodium _____ Water may contain a considerable amount of sodium, depending on the source _____ You can always tell if a food is high in sodium by tasting it _____ Certain laxatives my be high in sodium and should be avoided unless prescribed   by a physician or pharmacist _____ Salt substitutes may be used freely by anyone on a sodium restricted diet _____ Sodium is present in table salt, food additives and as a natural component of   most foods _____ Table salt is approximately 90% sodium _____ Limiting sodium intake may help prevent excess fluid accumulation in the body _____ On a sodium-restricted diet, seasonings such as bouillon soy sauce, and    cooking wine should be used in place of table salt _____ On an ingredient list, a product which lists monosodium glutamate as the first   ingredient is an appropriate food to include on a low sodium diet  Circle the best answer(s) to the following statements (Hint: there may be more than one correct answer)  11. On a low-sodium diet, some acceptable snack items are:    A. Olives  F. Bean dip   K. Grapefruit juice    B. Salted Pretzels G. Commercial Popcorn   L. Canned peaches    C. Carrot Sticks  H. Bouillon   M. Unsalted nuts   D. [place] fries  I. Peanut butter crackers N. Salami   E. Sweet pickles J. Tomato Juice   O. Pizza  12.  Seasonings that may be used freely on a reduced - sodium diet include   A. Lemon wedges F.Monosodium glutamate K. Celery seed    B.Soysauce   G. Pepper   L. Mustard powder   C. Sea salt  H. Cooking wine  M. Onion flakes   D. Vinegar  E. Prepared horseradish N. Salsa   E. Sage   J. Worcestershire sauce  O. Chutney

## 2021-09-03 DIAGNOSIS — J188 Other pneumonia, unspecified organism: Secondary | ICD-10-CM | POA: Diagnosis not present

## 2021-09-03 DIAGNOSIS — T826XXA Infection and inflammatory reaction due to cardiac valve prosthesis, initial encounter: Secondary | ICD-10-CM | POA: Diagnosis not present

## 2021-09-03 DIAGNOSIS — I110 Hypertensive heart disease with heart failure: Secondary | ICD-10-CM | POA: Diagnosis not present

## 2021-09-03 DIAGNOSIS — N390 Urinary tract infection, site not specified: Secondary | ICD-10-CM | POA: Diagnosis not present

## 2021-09-03 DIAGNOSIS — A419 Sepsis, unspecified organism: Secondary | ICD-10-CM | POA: Diagnosis not present

## 2021-09-03 DIAGNOSIS — A4181 Sepsis due to Enterococcus: Secondary | ICD-10-CM | POA: Diagnosis not present

## 2021-09-03 DIAGNOSIS — Z452 Encounter for adjustment and management of vascular access device: Secondary | ICD-10-CM | POA: Diagnosis not present

## 2021-09-04 ENCOUNTER — Other Ambulatory Visit: Payer: Self-pay | Admitting: Cardiology

## 2021-09-07 ENCOUNTER — Telehealth: Payer: Self-pay

## 2021-09-07 ENCOUNTER — Ambulatory Visit (HOSPITAL_COMMUNITY)
Admission: RE | Admit: 2021-09-07 | Discharge: 2021-09-07 | Disposition: A | Discharge status: Home / Self Care | Payer: Medicare Other | Source: Ambulatory Visit | Attending: Cardiology | Admitting: Cardiology

## 2021-09-07 ENCOUNTER — Other Ambulatory Visit: Payer: Self-pay

## 2021-09-07 DIAGNOSIS — R0600 Dyspnea, unspecified: Secondary | ICD-10-CM | POA: Diagnosis not present

## 2021-09-07 DIAGNOSIS — T826XXA Infection and inflammatory reaction due to cardiac valve prosthesis, initial encounter: Secondary | ICD-10-CM | POA: Diagnosis not present

## 2021-09-07 DIAGNOSIS — R112 Nausea with vomiting, unspecified: Secondary | ICD-10-CM | POA: Diagnosis not present

## 2021-09-07 DIAGNOSIS — J188 Other pneumonia, unspecified organism: Secondary | ICD-10-CM | POA: Diagnosis not present

## 2021-09-07 DIAGNOSIS — A4181 Sepsis due to Enterococcus: Secondary | ICD-10-CM | POA: Diagnosis not present

## 2021-09-07 DIAGNOSIS — I110 Hypertensive heart disease with heart failure: Secondary | ICD-10-CM | POA: Diagnosis not present

## 2021-09-07 DIAGNOSIS — N390 Urinary tract infection, site not specified: Secondary | ICD-10-CM | POA: Diagnosis not present

## 2021-09-07 DIAGNOSIS — Z452 Encounter for adjustment and management of vascular access device: Secondary | ICD-10-CM | POA: Diagnosis not present

## 2021-09-07 LAB — PULMONARY FUNCTION TEST
DL/VA % pred: 72 %
DL/VA: 2.86 ml/min/mmHg/L
DLCO cor % pred: 52 %
DLCO cor: 10.69 ml/min/mmHg
DLCO unc % pred: 44 %
DLCO unc: 9.15 ml/min/mmHg
FEF 25-75 Pre: 2.16 L/sec
FEF2575-%Pred-Pre: 151 %
FEV1-%Pred-Pre: 101 %
FEV1-Pre: 2.16 L
FEV1FVC-%Pred-Pre: 110 %
FEV6-%Pred-Pre: 98 %
FEV6-Pre: 2.67 L
FEV6FVC-%Pred-Pre: 105 %
FVC-%Pred-Pre: 93 %
FVC-Pre: 2.67 L
Pre FEV1/FVC ratio: 81 %
Pre FEV6/FVC Ratio: 100 %

## 2021-09-07 LAB — SARS CORONAVIRUS 2 (TAT 6-24 HRS): SARS Coronavirus 2: NEGATIVE

## 2021-09-07 MED ORDER — ALBUTEROL SULFATE (2.5 MG/3ML) 0.083% IN NEBU: 2.5000 mg | INHALATION_SOLUTION | Freq: Once | RESPIRATORY_TRACT | Status: DC

## 2021-09-07 NOTE — Telephone Encounter (Signed)
Thanks for the update Cece!

## 2021-09-07 NOTE — Telephone Encounter (Signed)
Received call from [place], [place] with [facility] for pull picc order. Per hospital notes patient is supposed to follow up with ID team in [place] to take over antbx and picc management.  Pt is postponing return to [place] until completion of IV antbx.Will be staying in [place] until 11/06/21. Spoke with Elna Breslow, [place] who advised patient will need to follow up with Dr. [place]. Will schedule appointment for this week.  Wende Crease, Frederick Leatrice Jewels, [place]

## 2021-09-09 ENCOUNTER — Other Ambulatory Visit: Payer: Self-pay

## 2021-09-09 ENCOUNTER — Telehealth: Payer: Self-pay

## 2021-09-09 ENCOUNTER — Ambulatory Visit (INDEPENDENT_AMBULATORY_CARE_PROVIDER_SITE_OTHER): Payer: Medicare Other | Admitting: Infectious Diseases

## 2021-09-09 VITALS — BP 141/85 | HR 91

## 2021-09-09 DIAGNOSIS — R7881 Bacteremia: Secondary | ICD-10-CM | POA: Diagnosis not present

## 2021-09-09 DIAGNOSIS — I38 Endocarditis, valve unspecified: Secondary | ICD-10-CM

## 2021-09-09 DIAGNOSIS — T826XXD Infection and inflammatory reaction due to cardiac valve prosthesis, subsequent encounter: Secondary | ICD-10-CM

## 2021-09-09 DIAGNOSIS — Z5181 Encounter for therapeutic drug level monitoring: Secondary | ICD-10-CM

## 2021-09-09 MED ORDER — AMOXICILLIN 500 MG PO CAPS: 500.0000 mg | ORAL_CAPSULE | Freq: Three times a day (TID) | ORAL | 2 refills | Status: DC

## 2021-09-09 NOTE — Telephone Encounter (Signed)
Verbal orders given to [facility] with [facility] to pull PICC after last dose of iv abx on 09/17/2021.   Khadeem Rockett Lorita Officer, RN

## 2021-09-09 NOTE — Progress Notes (Addendum)
[facility]                                                             [place], [place], [place], [address]                                                                  Phn. [phone]; Fax: [phone]                                                                             Date: 09/09/21  Reason for Referral: HFU for endocarditis    Assessment Prosthetic AV Endocarditis ( E faecalis, vegetation size 69m) Medication Monitoring 09/07/21 Cr 0.9, ESR 25, CRP 21   Plan Complete 6 weeks of IV ampicillin and ceftriaxone on 12/1. PICC line to be removed after last dose on 09/17/21 Amoxicillin 5077mPO TID to be started from 12/2 for suppression given patient has been considered a non surgical candidate. Will start on lower dose and plan to titrate up to 100060mO tid if she tolerates.  Follow up with Cardiology  Follow up in 1 month and plan for TTE at that time  All questions and concerns were discussed and addressed. Patient verbalized understanding of the plan. 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   Patient was recently hospitalized 10/18-10/25 for E faecalis bacteremia and PV endocarditis of TAVR. TEE 10/24 showed mild to moderate MVR, mild to moderate TVR, Prosthetic AV vegetation 0.8*0.7cm. Gated CTA 10/25 showed large vegetation  of the Prosthetic AV ( 61m73mithout abscess with left atrial thrombus. MR brain  08/29/21 Punctate acute to subacute infarct posterior left centrum semiovale. Blood cultures cleared on 10/20. Evaluated by Cardiology and CT surgery and was considered a poor surgical candidate and was planned for 6 weeks of IV ampicillin and ceftriaxone followed by PO antibiotic suppression life long.   Accompanied by her husband. Getting IV ceftriaxone and IV ampicillin. Denies any issues with PICC. Denies nausea, vomiting, rashes or diarrhea. Denies chest pain and cough but has SOB. She was SOB even walking 50 yards from parking lot to  the exam room. [place] after completion of her IV abtx per husband. Discussed with husband she would need to follow up with an Infectious disease doctor as well as cardiologist when [place] go [place]: Constitutional: Negative for fever, chills, appetite change, fatigue  and unexpected weight change.  Respiratory: Negative for cough, shortness of breath + Cardiovascular: Negative for chest pain, palpitations and leg swelling.  Gastrointestinal: Negative for nausea, vomiting, abdominal pain, diarrhea/constipation, .  Genitourinary: Negative for dysuria, hematuria, flank pain Musculoskeletal: Negative for myalgias, arthralgia, back pain, joint swelling, arthralgias Skin: Negative for rashes, lesions  Neurological: Negative for weakness, dizziness or headache  Past Medical History:  Diagnosis Date   Anginal pain (HCC)    Aortic stenosis    severe AS with moderate AR by echo with normal LVF   Arthritis    CAD in native artery, non obstructive cath 11/2016.  12/16/2016   Cancer ([facility])    left breast mastectomy(cancer)-surgery only   Chronic diastolic congestive heart failure (HCC)    Complication of anesthesia    extremely claustophobic(only occurs with narcotic medications as stated per pt)   Diverticulosis    Dysrhythmia    Afib   Fibromyalgia    GERD (gastroesophageal reflux disease)    Hard of hearing     wears hearing aides    History of breast cancer no recurrence   dx 2004   s/p  left breast mastectomy w/ node dissection/   no chemo or radiation   History of TIA (transient ischemic attack)    per scan--  no residual   Hyperlipidemia    Hypertension    Nocturia    OSA on CPAP     severe/  AHI 34/hr   Permanent atrial fibrillation ([facility])    on Xarelto for CHADS2VASC score of 5   Pulmonary HTN ([facility])    PASP 35mHg by echo 11/2016   Urge urinary incontinence    Wears glasses    Past Surgical History:  Procedure Laterality Date   ABDOMINOPLASTY  1[place]  BREAST SURGERY Left 2004   mastectomy    CARDIAC CATHETERIZATION N/A 11/19/2016   Procedure: Right/Left Heart Cath and Coronary Angiography;  Surgeon: TTroy Sine MD;  [facility];  Service: Cardiovascular;  Laterality: N/A;   CARPAL TUNNEL RELEASE Right 07-08-2004   COLONOSCOPY WITH ESOPHAGOGASTRODUODENOSCOPY (EGD)  2006   EXPLANTATION BREAST IMPLANT AND DEBRIDEMENT Left 09/15/2005   INTERSTIM IMPLANT PLACEMENT N/A 08/22/2014   Procedure: IBarrie LymeIMPLANT STAGE ONE/TWO;  Surgeon: SReece Packer MD;  [facility]  Service: Urology;  Laterality: N/A;   JOINT REPLACEMENT Left 2017   knee replacement   MASTECTOMY     MASTECTOMY, MODIFIED RADICAL W/RECONSTRUCTION  09-16-2003   left breast W/ SLN DISSECTION (pt states had 13 operations for implant infection    REPLACE TISSUE EXPANDER LEFT BREAST AND TOTAL CAPSULECTOMY  08-05-2006   REVERSE SHOULDER ARTHROPLASTY Left 10/26/2019   Procedure: REVERSE SHOULDER ARTHROPLASTY;  Surgeon: NNetta Cedars MD;  Location: [facility];  Service: Orthopedics;  Laterality: Left;  with interscalene block   REVISION BREAST RECONSTRUCTION Left 11-11-2004   12-03-2004  DRAINAGE SEREMA LEFT CHEST   TEE WITHOUT CARDIOVERSION N/A 12/14/2016   Procedure: TRANSESOPHAGEAL ECHOCARDIOGRAM (TEE);  Surgeon: CBurnell Blanks MD;  [facility]   Service: Open Heart Surgery;  Laterality: N/A;   TEE WITHOUT CARDIOVERSION N/A 08/10/2021   Procedure: TRANSESOPHAGEAL ECHOCARDIOGRAM (TEE);  Surgeon: CBuford Dresser MD;  [facility];  Service: Cardiovascular;  Laterality: N/A;   TOTAL HIP ARTHROPLASTY Left 07/17/2013   Procedure: LEFT TOTAL HIP ARTHROPLASTY ANTERIOR APPROACH;  Surgeon: MMauri Pole MD;  Location: [facility];  Service: Orthopedics;  Laterality: Left;  TOTAL HIP ARTHROPLASTY Right 01/28/2015   Procedure: RIGHT TOTAL HIP ARTHROPLASTY ANTERIOR APPROACH;  Surgeon: Paralee Cancel, MD;  Location: [facility];  Service: Orthopedics;  Laterality: Right;   TOTAL KNEE ARTHROPLASTY Left 11/11/2015   Procedure: LEFT TOTAL KNEE ARTHROPLASTY;  Surgeon: Paralee Cancel, MD;  Location: [facility];  Service: Orthopedics;  Laterality: Left;   TOTAL SHOULDER ARTHROPLASTY  08/11/2012   Procedure: TOTAL SHOULDER ARTHROPLASTY;  Surgeon: Augustin Schooling, MD;  Location: [facility];  Service: Orthopedics;  Laterality: Right;  RIGHT  TOTAL SHOULDER  ARTHROPLASTY    TRANSCATHETER AORTIC VALVE REPLACEMENT, TRANSFEMORAL N/A 12/14/2016   Procedure: TRANSCATHETER AORTIC VALVE REPLACEMENT, TRANSFEMORAL;  Surgeon: Burnell Blanks, MD;  Location: [facility];  Service: Open Heart Surgery;  Laterality: N/A;   TRANSTHORACIC ECHOCARDIOGRAM  02-25-2014   moderate LVH/  ef 55-60%/  mod. to sev. calcification AV with mild to moderate AV stenosis/  mild to mod. AR and MR/ mild PR/   moderate to severe LAE   TUBAL LIGATION  1973   Allergies  Allergen Reactions   Beta Adrenergic Blockers Shortness Of Breath   Other     Narcotic pain medications - anxiety attacks   Oxycodone Shortness Of Breath and Other (See Comments)    Tolerates Dilaudid, Fentanyl   Sotalol Hcl Shortness Of Breath and Other (See Comments)   Tramadol Anxiety and Other (See Comments)    Can take if absolutely necessary   Clonidine Derivatives Palpitations    bradycardia   Crestor [Rosuvastatin  Calcium] Other (See Comments)    MYALGIAS   Latex Rash    Per patient-bandaids- no problems with gloves   Rosuvastatin Calcium Nausea Only   Rosuvastatin    Tape Rash     bandaids    Social History   Socioeconomic History   Marital status: Married    Spouse name: Not on file   Number of children: Not on file   Years of education: Not on file   Highest education level: Not on file  Occupational History   Not on file  Tobacco Use   Smoking status: Former    Packs/day: 0.25    Years: 8.00    Pack years: 2.00    Types: Cigarettes    Quit date: 10/18/1966    Years since quitting: 54.9   Smokeless tobacco: Never  Vaping Use   Vaping Use: Never used  Substance and Sexual Activity   Alcohol use: No   Drug use: No   Sexual activity: Yes  Other Topics Concern   Not on file  Social History Narrative   Not on file   Social Determinants of Health   Financial Resource Strain: Not on file  Food Insecurity: Not on file  Transportation Needs: Not on file  Physical Activity: Not on file  Stress: Not on file  Social Connections: Not on file  Intimate Partner Violence: Not on file   Family History  Problem Relation Age of Onset   Heart disease Mother    Heart disease Father      Vitals BP (!) 141/85   Pulse 91    Examination  General - not in acute distress, comfortably sitting in chair HEENT - PEERLA, no pallor and no icterus Chest - b/l clear air entry, no additional sounds CVS- Normal I6E7, systolic murmur+ Abdomen - Soft, Non tender , non distended Ext- no pedal edema, PICC line site OK  Neuro: grossly normal Psych : calm and cooperative   Recent labs CBC Latest  Ref Rng & Units 09/01/2021 08/10/2021 08/09/2021  WBC 3.4 - 10.8 x10E3/uL 8.3 11.1(H) 11.8(H)  Hemoglobin 11.1 - 15.9 g/dL 9.5(L) 9.6(L) 10.3(L)  Hematocrit 34.0 - 46.6 % 29.6(L) 29.8(L) 32.5(L)  Platelets 150 - 450 x10E3/uL 159 209 207   CMP Latest Ref Rng & Units 09/01/2021 08/10/2021 08/09/2021   Glucose 70 - 99 mg/dL 88 82 87  BUN 8 - 27 mg/dL '22 12 15  ' Creatinine 0.57 - 1.00 mg/dL 0.83 0.99 0.99  Sodium 134 - 144 mmol/L 141 138 139  Potassium 3.5 - 5.2 mmol/L 3.9 3.4(L) 3.4(L)  Chloride 96 - 106 mmol/L 103 105 107  CO2 20 - 29 mmol/L 22 21(L) 21(L)  Calcium 8.7 - 10.3 mg/dL 9.8 8.5(L) 8.8(L)  Total Protein 6.5 - 8.1 g/dL - - -  Total Bilirubin 0.3 - 1.2 mg/dL - - -  Alkaline Phos 38 - 126 U/L - - -  AST 15 - 41 U/L - - -  ALT 0 - 44 U/L - - -     Pertinent Microbiology Results for orders placed or performed in visit on 09/04/21  SARS Coronavirus 2 (TAT 6-24 hrs)     Status: None   Collection Time: 09/04/21 12:00 AM  Result Value Ref Range Status   SARS Coronavirus 2 RESULT: NEGATIVE  Final    Comment: RESULT: NEGATIVESARS-CoV-2 INTERPRETATION:A NEGATIVE  test result means that SARS-CoV-2 RNA was not present in the specimen above the limit of detection of this test. This does not preclude a possible SARS-CoV-2 infection and should not be used as the  sole basis for patient management decisions. Negative results must be combined with clinical observations, patient history, and epidemiological information. Optimum specimen types and timing for peak viral levels during infections caused by SARS-CoV-2  have not been determined. Collection of multiple specimens or types of specimens may be necessary to detect virus. Improper specimen collection and handling, sequence variability under primers/probes, or organism present below the limit of detection may  lead to false negative results. Positive and negative predictive values of testing are highly dependent on prevalence. False negative test results are more likely when prevalence of disease is high.The expected result is NEGATIVE.Fact S heet for  Healthcare Providers: LocalChronicle.no Sheet for Patients: SalonLookup.es Reference Range - Negative    Pertinent  Imaging Gated CTA 08/11/21 IMPRESSION: 1. There is a large vegetation of the prosthetic aortic valve without abscess. Reaching out to primary team.   2.  There is a left atrial thrombus.  TEE 08/10/21 IMPRESSIONS   1. Left ventricular ejection fraction, by estimation, is 55 to 60%. The  left ventricle has normal function. The left ventricle has no regional  wall motion abnormalities.   2. Right ventricular systolic function is normal. The right ventricular  size is normal.   3. Left atrial size was severely dilated. No left atrial/left atrial  appendage thrombus was detected.   4. Right atrial size was severely dilated.   5. The mitral valve is normal in structure. Mild to moderate mitral valve  regurgitation.   6. Tricuspid valve regurgitation is mild to moderate.   7. There is a vegetation seen on the prosthetic aortic valve, 0.8 x 0.7  cm, predominantly on the ventricular side of the aortic valve but small  filament also see on aortic side of the valve. The aortic valve has been  repaired/replaced. Aortic valve  regurgitation is trivial. There is a 26 mm Edwards Sapien prosthetic  (TAVR) valve present in the aortic  position. Procedure Date: 12/14/16.   8. There is Moderate (Grade III) plaque involving the descending aorta.   Conclusion(s)/Recommendation(s): Positive study for endocarditis of  prosthetic aortic valve.   TTE 08/05/20 Left ventricular ejection fraction, by estimation, is 55 to 60%. The left ventricle has normal function. The left ventricle has no regional wall motion abnormalities. There is mild concentric left ventricular hypertrophy. Left ventricular diastolic parameters are indeterminate. 1. 2. Right ventricular systolic function is normal. The right ventricular size is normal. 3. Left atrial size was severely dilated. 4. Right atrial size was severely dilated. The mitral valve is normal in structure. No evidence of mitral valve regurgitation. No evidence  of mitral stenosis. 5. 6. Tricuspid valve regurgitation is mild to moderate. The aortic valve has been repaired/replaced. Aortic valve regurgitation is not visualized. No aortic stenosis is present. There is a 26 mm Edwards Sapien prosthetic (TAVR) valve present in the aortic position. Aortic valve mean gradient measures 19.4 mmHg. Aortic valve Vmax measures 2.78 m/s. 7. The inferior vena cava is normal in size with greater  MR brain 08/18/21   IMPRESSION: Degraded by motion.   Punctate acute to subacute infarct posterior left centrum semiovale. Additional likely subacute to chronic centrum semiovale infarcts bilaterally.   Moderate chronic microvascular ischemic changes. Chronic small vessel infarcts. Scattered chronic microhemorrhages likely secondary to hypertension.  All pertinent labs/Imagings/notes reviewed. All pertinent plain films and CT images have been personally visualized and interpreted; radiology reports have been reviewed. Decision making incorporated into the Impression / Recommendations.  I have spent a total of40  minutes of face-to-face and non-face-to-face time, excluding clinical staff time, preparing to see patient, ordering tests and/or medications, and provide counseling the patient    Electronically signed by:  Rosiland Oz, MD Infectious Disease Physician [facility] [address] [place], [place] [address] Phone: [phone]  Fax: [phone]

## 2021-09-12 DIAGNOSIS — I25119 Atherosclerotic heart disease of native coronary artery with unspecified angina pectoris: Secondary | ICD-10-CM | POA: Diagnosis not present

## 2021-09-12 DIAGNOSIS — Z9181 History of falling: Secondary | ICD-10-CM | POA: Diagnosis not present

## 2021-09-12 DIAGNOSIS — N390 Urinary tract infection, site not specified: Secondary | ICD-10-CM | POA: Diagnosis not present

## 2021-09-12 DIAGNOSIS — J188 Other pneumonia, unspecified organism: Secondary | ICD-10-CM | POA: Diagnosis not present

## 2021-09-12 DIAGNOSIS — A4181 Sepsis due to Enterococcus: Secondary | ICD-10-CM | POA: Diagnosis not present

## 2021-09-12 DIAGNOSIS — G4733 Obstructive sleep apnea (adult) (pediatric): Secondary | ICD-10-CM | POA: Diagnosis not present

## 2021-09-12 DIAGNOSIS — M1990 Unspecified osteoarthritis, unspecified site: Secondary | ICD-10-CM | POA: Diagnosis not present

## 2021-09-12 DIAGNOSIS — Z87891 Personal history of nicotine dependence: Secondary | ICD-10-CM | POA: Diagnosis not present

## 2021-09-12 DIAGNOSIS — I4821 Permanent atrial fibrillation: Secondary | ICD-10-CM | POA: Diagnosis not present

## 2021-09-12 DIAGNOSIS — I110 Hypertensive heart disease with heart failure: Secondary | ICD-10-CM | POA: Diagnosis not present

## 2021-09-12 DIAGNOSIS — K219 Gastro-esophageal reflux disease without esophagitis: Secondary | ICD-10-CM | POA: Diagnosis not present

## 2021-09-12 DIAGNOSIS — Z7952 Long term (current) use of systemic steroids: Secondary | ICD-10-CM | POA: Diagnosis not present

## 2021-09-12 DIAGNOSIS — I5032 Chronic diastolic (congestive) heart failure: Secondary | ICD-10-CM | POA: Diagnosis not present

## 2021-09-12 DIAGNOSIS — D500 Iron deficiency anemia secondary to blood loss (chronic): Secondary | ICD-10-CM | POA: Diagnosis not present

## 2021-09-12 DIAGNOSIS — E785 Hyperlipidemia, unspecified: Secondary | ICD-10-CM | POA: Diagnosis not present

## 2021-09-12 DIAGNOSIS — I272 Pulmonary hypertension, unspecified: Secondary | ICD-10-CM | POA: Diagnosis not present

## 2021-09-12 DIAGNOSIS — N3941 Urge incontinence: Secondary | ICD-10-CM | POA: Diagnosis not present

## 2021-09-12 DIAGNOSIS — I248 Other forms of acute ischemic heart disease: Secondary | ICD-10-CM | POA: Diagnosis not present

## 2021-09-12 DIAGNOSIS — M797 Fibromyalgia: Secondary | ICD-10-CM | POA: Diagnosis not present

## 2021-09-12 DIAGNOSIS — Z7901 Long term (current) use of anticoagulants: Secondary | ICD-10-CM | POA: Diagnosis not present

## 2021-09-12 DIAGNOSIS — Z792 Long term (current) use of antibiotics: Secondary | ICD-10-CM | POA: Diagnosis not present

## 2021-09-12 DIAGNOSIS — T826XXA Infection and inflammatory reaction due to cardiac valve prosthesis, initial encounter: Secondary | ICD-10-CM | POA: Diagnosis not present

## 2021-09-12 DIAGNOSIS — Z7983 Long term (current) use of bisphosphonates: Secondary | ICD-10-CM | POA: Diagnosis not present

## 2021-09-12 DIAGNOSIS — Z452 Encounter for adjustment and management of vascular access device: Secondary | ICD-10-CM | POA: Diagnosis not present

## 2021-09-12 DIAGNOSIS — H9190 Unspecified hearing loss, unspecified ear: Secondary | ICD-10-CM | POA: Diagnosis not present

## 2021-09-13 DIAGNOSIS — Z452 Encounter for adjustment and management of vascular access device: Secondary | ICD-10-CM | POA: Diagnosis not present

## 2021-09-13 DIAGNOSIS — T826XXA Infection and inflammatory reaction due to cardiac valve prosthesis, initial encounter: Secondary | ICD-10-CM | POA: Diagnosis not present

## 2021-09-13 DIAGNOSIS — A419 Sepsis, unspecified organism: Secondary | ICD-10-CM | POA: Diagnosis not present

## 2021-09-14 DIAGNOSIS — I110 Hypertensive heart disease with heart failure: Secondary | ICD-10-CM | POA: Diagnosis not present

## 2021-09-14 DIAGNOSIS — J188 Other pneumonia, unspecified organism: Secondary | ICD-10-CM | POA: Diagnosis not present

## 2021-09-14 DIAGNOSIS — T826XXA Infection and inflammatory reaction due to cardiac valve prosthesis, initial encounter: Secondary | ICD-10-CM | POA: Diagnosis not present

## 2021-09-14 DIAGNOSIS — A4181 Sepsis due to Enterococcus: Secondary | ICD-10-CM | POA: Diagnosis not present

## 2021-09-14 DIAGNOSIS — Z452 Encounter for adjustment and management of vascular access device: Secondary | ICD-10-CM | POA: Diagnosis not present

## 2021-09-14 DIAGNOSIS — N390 Urinary tract infection, site not specified: Secondary | ICD-10-CM | POA: Diagnosis not present

## 2021-09-16 DIAGNOSIS — I110 Hypertensive heart disease with heart failure: Secondary | ICD-10-CM | POA: Diagnosis not present

## 2021-09-16 DIAGNOSIS — A4181 Sepsis due to Enterococcus: Secondary | ICD-10-CM | POA: Diagnosis not present

## 2021-09-16 DIAGNOSIS — J188 Other pneumonia, unspecified organism: Secondary | ICD-10-CM | POA: Diagnosis not present

## 2021-09-16 DIAGNOSIS — T826XXA Infection and inflammatory reaction due to cardiac valve prosthesis, initial encounter: Secondary | ICD-10-CM | POA: Diagnosis not present

## 2021-09-16 DIAGNOSIS — Z452 Encounter for adjustment and management of vascular access device: Secondary | ICD-10-CM | POA: Diagnosis not present

## 2021-09-16 DIAGNOSIS — N390 Urinary tract infection, site not specified: Secondary | ICD-10-CM | POA: Diagnosis not present

## 2021-09-18 ENCOUNTER — Telehealth: Payer: Self-pay | Admitting: *Deleted

## 2021-09-18 ENCOUNTER — Ambulatory Visit: Payer: Medicare Other | Admitting: Physician Assistant

## 2021-09-18 MED ORDER — POTASSIUM CHLORIDE CRYS ER 20 MEQ PO TBCR: 20.0000 meq | EXTENDED_RELEASE_TABLET | Freq: Every day | ORAL | 3 refills | Status: DC

## 2021-09-18 NOTE — Telephone Encounter (Signed)
S/w pt's spouse to let spouse know Laurann [place], NP reviewed pt's labs and did not see anything concerning except Potassium. Stated pt is taking K-dur 20 mEq daily.Moved appt up per [facility] to Dec 9.  Will update medication list.

## 2021-09-18 NOTE — Telephone Encounter (Signed)
S/w pt's spouse per DPR is aware had to move pt's appointment today due to [place] being out sick.  Moved to [employer], NP schedule in two week.  Spouse seen a concerned about pt's recent lab work from [facility]. I sent a message to [facility] to see if this pt was ok to bring back in another two weeks. Urban Gibson is looking into this.

## 2021-09-21 DIAGNOSIS — N390 Urinary tract infection, site not specified: Secondary | ICD-10-CM | POA: Diagnosis not present

## 2021-09-21 DIAGNOSIS — Z452 Encounter for adjustment and management of vascular access device: Secondary | ICD-10-CM | POA: Diagnosis not present

## 2021-09-21 DIAGNOSIS — I110 Hypertensive heart disease with heart failure: Secondary | ICD-10-CM | POA: Diagnosis not present

## 2021-09-21 DIAGNOSIS — J188 Other pneumonia, unspecified organism: Secondary | ICD-10-CM | POA: Diagnosis not present

## 2021-09-21 DIAGNOSIS — A4181 Sepsis due to Enterococcus: Secondary | ICD-10-CM | POA: Diagnosis not present

## 2021-09-21 DIAGNOSIS — T826XXA Infection and inflammatory reaction due to cardiac valve prosthesis, initial encounter: Secondary | ICD-10-CM | POA: Diagnosis not present

## 2021-09-24 NOTE — Progress Notes (Signed)
Office Visit    Patient Name: [name] Date of Encounter: 09/25/2021  PCP:  Lujean Amel, [place]  Cardiologist:  Fransico Him, MD  Advanced Practice Provider:  No care team member to display Electrophysiologist:  None      Chief Complaint    [name] is a 85 y.o. female with a hx of chronic atrial fibrillation, chronic diastolic heart failure, nonobstructive coronary disease, aortic stenosis s/p TAVR 2018, HTN, HLD, OSA on CPAP, GERD, breast cancer, obesity presents today for follow up of endocarditis, diastolic heart failure   Past Medical History    Past Medical History:  Diagnosis Date   Anginal pain ([place])    Aortic stenosis    severe AS with moderate AR by echo with normal LVF   Arthritis    CAD in native artery, non obstructive cath 11/2016.  12/16/2016   Cancer ([place])    left breast mastectomy(cancer)-surgery only   Chronic diastolic congestive heart failure (HCC)    Complication of anesthesia    extremely claustophobic(only occurs with narcotic medications as stated per pt)   Diverticulosis    Dysrhythmia    Afib   Fibromyalgia    GERD (gastroesophageal reflux disease)    Hard of hearing    wears hearing aides    History of breast cancer no recurrence   dx 2004   s/p  left breast mastectomy w/ node dissection/   no chemo or radiation   History of TIA (transient ischemic attack)    per scan--  no residual   Hyperlipidemia    Hypertension    Nocturia    OSA on CPAP     severe/  AHI 34/hr   Permanent atrial fibrillation ([place])    on Xarelto for CHADS2VASC score of 5   Pulmonary HTN ([place])    PASP 17mmHg by echo 11/2016   Urge urinary incontinence    Wears glasses    Past Surgical History:  Procedure Laterality Date   ABDOMINOPLASTY  [place]   BREAST SURGERY Left 2004   mastectomy    CARDIAC CATHETERIZATION N/A 11/19/2016   Procedure: Right/Left Heart Cath and Coronary Angiography;   Surgeon: Troy Sine, MD;  Location: [facility];  Service: Cardiovascular;  Laterality: N/A;   CARPAL TUNNEL RELEASE Right 07-08-2004   COLONOSCOPY WITH ESOPHAGOGASTRODUODENOSCOPY (EGD)  2006   EXPLANTATION BREAST IMPLANT AND DEBRIDEMENT Left 09/15/2005   INTERSTIM IMPLANT PLACEMENT N/A 08/22/2014   Procedure: Barrie Lyme IMPLANT STAGE ONE/TWO;  Surgeon: Reece Packer, MD;  Location: [facility];  Service: Urology;  Laterality: N/A;   JOINT REPLACEMENT Left 2017   knee replacement   MASTECTOMY     MASTECTOMY, MODIFIED RADICAL W/RECONSTRUCTION  09-16-2003   left breast W/ SLN DISSECTION (pt states had 13 operations for implant infection    REPLACE TISSUE EXPANDER LEFT BREAST AND TOTAL CAPSULECTOMY  08-05-2006   REVERSE SHOULDER ARTHROPLASTY Left 10/26/2019   Procedure: REVERSE SHOULDER ARTHROPLASTY;  Surgeon: Netta Cedars, MD;  Location: [facility];  Service: Orthopedics;  Laterality: Left;  with interscalene block   REVISION BREAST RECONSTRUCTION Left 11-11-2004   12-03-2004  DRAINAGE SEREMA LEFT CHEST   TEE WITHOUT CARDIOVERSION N/A 12/14/2016   Procedure: TRANSESOPHAGEAL ECHOCARDIOGRAM (TEE);  Surgeon: Burnell Blanks, MD;  Location: [facility];  Service: Open Heart Surgery;  Laterality: N/A;   TEE WITHOUT CARDIOVERSION N/A 08/10/2021   Procedure: TRANSESOPHAGEAL ECHOCARDIOGRAM (TEE);  Surgeon: Buford Dresser, MD;  Location: [facility];  Service: Cardiovascular;  Laterality: N/A;   TOTAL HIP ARTHROPLASTY Left 07/17/2013   Procedure: LEFT TOTAL HIP ARTHROPLASTY ANTERIOR APPROACH;  Surgeon: Mauri Pole, MD;  Location: [facility];  Service: Orthopedics;  Laterality: Left;   TOTAL HIP ARTHROPLASTY Right 01/28/2015   Procedure: RIGHT TOTAL HIP ARTHROPLASTY ANTERIOR APPROACH;  Surgeon: Paralee Cancel, MD;  Location: [facility];  Service: Orthopedics;  Laterality: Right;   TOTAL KNEE ARTHROPLASTY Left 11/11/2015   Procedure: LEFT TOTAL KNEE ARTHROPLASTY;  Surgeon: Paralee Cancel, MD;  Location: [facility];  Service: Orthopedics;  Laterality: Left;   TOTAL SHOULDER ARTHROPLASTY  08/11/2012   Procedure: TOTAL SHOULDER ARTHROPLASTY;  Surgeon: Augustin Schooling, MD;  Location: [facility];  Service: Orthopedics;  Laterality: Right;  RIGHT  TOTAL SHOULDER  ARTHROPLASTY    TRANSCATHETER AORTIC VALVE REPLACEMENT, TRANSFEMORAL N/A 12/14/2016   Procedure: TRANSCATHETER AORTIC VALVE REPLACEMENT, TRANSFEMORAL;  Surgeon: Burnell Blanks, MD;  Location: [facility];  Service: Open Heart Surgery;  Laterality: N/A;   TRANSTHORACIC ECHOCARDIOGRAM  02-25-2014   moderate LVH/  ef 55-60%/  mod. to sev. calcification AV with mild to moderate AV stenosis/  mild to mod. AR and MR/ mild PR/   moderate to severe LAE   TUBAL LIGATION  1973    Allergies  Allergies  Allergen Reactions   Beta Adrenergic Blockers Shortness Of Breath   Other     Narcotic pain medications - anxiety attacks   Oxycodone Shortness Of Breath and Other (See Comments)    Tolerates Dilaudid, Fentanyl   Sotalol Hcl Shortness Of Breath and Other (See Comments)   Tramadol Anxiety and Other (See Comments)    Can take if absolutely necessary   Clonidine Derivatives Palpitations    bradycardia   Crestor [Rosuvastatin Calcium] Other (See Comments)    MYALGIAS   Latex Rash    Per patient-bandaids- no problems with gloves   Rosuvastatin Calcium Nausea Only   Rosuvastatin    Tape Rash     bandaids     History of Present Illness    [name] is a 85 y.o. female with a hx of chronic atrial fibrillation, chronic diastolic heart failure, nonobstructive coronary disease, aortic stenosis s/p TAVR 2018, HTN, HLD, OSA on CPAP, GERD, breast cancer, obesity last seen 09/01/21 by Estella Husk, PA.  She had echo 5/202 with LVEF 60-65%, moderate LVH, moderately elevated PA pressures, severe biatrial enlargement, aortic stenosis s/p TAVR. This was overall stable compared to previous. She had worsening pulmonary hypertension  and PFTs recommended for VQ scan which was negative. 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 Cardiac CT with evidence of large vegetation and LAA thrombus. Not candidate for SAVR and on abx per ID. Suspected she will need chronic suppression.   She was seen in follow up 09/01/21 by Estella Husk, PA noting persistent dyspnea and fatigue. Notes her  dyspnea predated her hospitalization. She noted edema and her Lasix was increased to 40mg  x3 days then return to 20mg  QD.  Saw ID 09/09/21 with plan to complete 6 weeks IV ampicillin on 12/1 then remove PICC. Then transition to Amoxicilin 500mg  PO BID starting 12/2 with plan to uptitrate to 1000mg  BID if tolerated. She has follow up in 1 month with ID with plan for TTE at that time.   She presents today for follow up with her husband.  They endorse understandable frustration as  she has not been able yet to start home health physical therapy.  She was referred to an agency however it was a different agency than her [facility] for her nursing and had to be readdressed.  Her primary care is working on this.  She reports no chest pain, vision, tightness.  Reports dyspnea on exertion which is overall unchanged from a clinic visit with Sharyn Lull last month.  She has been trying to walk the stairs in her home for exercise.  She reports her lower extremity edema has improved.  Her husband is understandably concerned about elevated CRP 28.1 from lab work 09/16/2021.  We discussed that this elevated CRP is somewhat expected given her recent admission for TAVR endocarditis.  We will recheck today to assess trend. Notes they plan to return to [place] 11/07/20. Typically only in the [place] for 2 month each year but grateful that she was in the [place] for her hospitalization.   09/16/21 labs at [facility] Hb 8.6, Hct 27,wbc 7.7 Na 147, K 2.8, Ch 107, ca 7.8 Crp 28.1  EKGs/Labs/Other Studies Reviewed:   The  following studies were reviewed today: TEE 08/15/2021: LEFT VENTRICLE: EF = 55-60%.  No regional wall motion abnormalities.  RIGHT VENTRICLE: Normal size and function.    LEFT ATRIUM: No thrombus/mass. Severely dilated   LEFT ATRIAL APPENDAGE: No thrombus/mass.    RIGHT ATRIUM: No thrombus/mass. Severely dilated   AORTIC VALVE:  S/P TAVR with Edwards Sapien 3, 53mm (date 12/14/2016). There is a vegetation seen on the prosthetic aortic valve, small in size, predominantly on the ventricular side of the aortic valve but small filament also see on aortic side of the valve. There is trivial regurgitation.   MITRAL VALVE:    Normal structure. Mild regurgitation. No vegetation.   TRICUSPID VALVE: Normal structure. MIld regurgitation. No vegetation.   PULMONIC VALVE: Grossly normal structure. Mild regurgitation. No apparent vegetation.   INTERATRIAL SEPTUM: No PFO or ASD seen by color Doppler.   PERICARDIUM: No effusion noted.   DESCENDING AORTA: Moderate diffuse plaque seen    CONCLUSION: Positive study for endocarditis of prosthetic aortic valve. Findings will be communicated to primary team.     Echocardiogram 08/05/21: 1. Left ventricular ejection fraction, by estimation, is 55 to 60%. The  left ventricle has normal function. The left ventricle has no regional  wall motion abnormalities. There is mild concentric left ventricular  hypertrophy. Left ventricular diastolic  parameters are indeterminate.   2. Right ventricular systolic function is normal. The right ventricular  size is normal.   3. Left atrial size was severely dilated.   4. Right atrial size was severely dilated.   5. The mitral valve is normal in structure. No evidence of mitral valve  regurgitation. No evidence of mitral stenosis.   6. Tricuspid valve regurgitation is mild to moderate.   7. The aortic valve has been repaired/replaced. Aortic valve  regurgitation is not visualized. No aortic stenosis is present.  There is a  26 mm Edwards Sapien prosthetic (TAVR) valve present in the aortic  position. Aortic valve mean gradient measures 19.4   mmHg. Aortic valve Vmax measures 2.78 m/s.   8. The inferior vena cava is normal in size with greater than 50%  respiratory variability, suggesting right atrial pressure of 3 mmHg.   Echocardiogram 02/2021: 1. Left ventricular ejection fraction, by estimation, is 60 to 65%. The  left ventricle has normal function. The left ventricle has no regional  wall motion abnormalities. There is  moderate left ventricular hypertrophy.  Left ventricular diastolic  parameters are indeterminate.   2. Right ventricular systolic function is normal. The right ventricular  size is mildly enlarged. There is moderately elevated pulmonary artery  systolic pressure. The estimated right ventricular systolic pressure is  39.0 mmHg.   3. Left atrial size was severely dilated.   4. Right atrial size was severely dilated.   5. The mitral valve is normal in structure. Trivial mitral valve  regurgitation.   6. The aortic valve has been repaired/replaced. Aortic valve  regurgitation is not visualized. There is a 26 mm Edwards Sapien  prosthetic (TAVR) valve present in the aortic position. Procedure Date:  12/14/16. Echo findings are consistent with normal  structure and function of the aortic valve prosthesis. MG 92mmHg, stable  from prior echo 08/22/19   Comparison(s): 08/22/19 EF 60-65%. AV 85mmHg mean PG, 20mmHg peak PG.    Coronary CTAIMPRESSION: 1. There is a large vegetation of the prosthetic aortic valve without abscess. Reaching out to primary team.   2.  There is a left atrial thrombus.   RECOMMENDATIONS:   Coronary artery calcium (CAC) score is a strong predictor of incident coronary heart disease (CHD) and provides predictive information beyond traditional risk factors. CAC scoring is reasonable to use in the decision to withhold, postpone, or initiate statin therapy in  intermediate-risk or selected borderline-risk asymptomatic adults (age 9-75 years and LDL-C >=70 to <190 mg/dL) who do not have diabetes or established atherosclerotic cardiovascular disease (ASCVD).* In intermediate-risk (10-year ASCVD risk >=7.5% to <20%) adults or selected borderline-risk (10-year ASCVD risk >=5% to <7.5%) adults in whom a CAC score is measured for the purpose of making a treatment decision the following recommendations have been made:  EKG: No EKG today  Recent Labs: 08/04/2021: B Natriuretic Peptide 474.6 08/05/2021: ALT 15 08/10/2021: Magnesium 1.6 09/01/2021: BUN 22; Creatinine, Ser 0.83; Hemoglobin 9.5; Platelets 159; Potassium 3.9; Sodium 141  Recent Lipid Panel    Component Value Date/Time   CHOL 148 02/02/2021 1113   TRIG 76 02/02/2021 1113   HDL 78 02/02/2021 1113   CHOLHDL 1.9 02/02/2021 1113   LDLCALC 55 02/02/2021 1113   Risk Assessment/Calculations:   CHA2DS2-VASc Score = 7  This indicates a 11.2% annual risk of stroke. The patient's score is based upon: CHF History: 1 HTN History: 1 Diabetes History: 1 Stroke History: 0 Vascular Disease History: 1 Age Score: 2 Gender Score: 1   Home Medications   Current Meds  Medication Sig   alendronate (FOSAMAX) 70 MG tablet Take 70 mg by mouth every Saturday.   atorvastatin (LIPITOR) 80 MG tablet Take 1 tablet (80 mg total) by mouth daily.   DILT-XR 240 MG 24 hr capsule Take 240 mg by mouth every morning.   furosemide (LASIX) 20 MG tablet Take 20 mg by mouth daily.    hydrOXYzine (ATARAX/VISTARIL) 25 MG tablet Take 25 mg by mouth every 12 (twelve) hours as needed for anxiety.   losartan (COZAAR) 100 MG tablet Take 1 tablet (100 mg total) by mouth daily.   Melatonin 10 MG TABS Take 10 mg by mouth at bedtime as needed (for sleep).    nitroGLYCERIN (NITROSTAT) 0.4 MG SL tablet Place 0.4 mg under the tongue every 5 (five) minutes as needed for chest pain.    Nutritional Supplements (VITAMIN D  BOOSTER PO) Take 800 Units by mouth at bedtime. Kalcipos-d forte   omeprazole (PRILOSEC) 20 MG capsule Take 20 mg by mouth daily as needed (acid  reflux/indigestion.).    potassium chloride SA (KLOR-CON M) 20 MEQ tablet Take 1 tablet (20 mEq total) by mouth daily.   predniSONE (DELTASONE) 5 MG tablet Take 5 mg by mouth daily with breakfast.   triamcinolone ointment (KENALOG) 0.1 % Apply 1 application topically 2 (two) times daily as needed (affected areas of feet).    [DISCONTINUED] rivaroxaban (XARELTO) 20 MG TABS tablet Take 20 mg by mouth daily with supper.     Review of Systems      All other systems reviewed and are otherwise negative except as noted above.  Physical Exam    VS:  BP (!) 148/52 (BP Location: Right Arm, Patient Position: Sitting, Cuff Size: Normal)   Pulse 76   Ht 5\' 7"  (1.702 m)   Wt 177 lb (80.3 kg)   BMI 27.72 kg/m  , BMI Body mass index is 27.72 kg/m.  Wt Readings from Last 3 Encounters:  09/25/21 177 lb (80.3 kg)  09/01/21 181 lb (82.1 kg)  08/10/21 185 lb 6.5 oz (84.1 kg)     GEN: Well developed, frail-appearing, in no acute distress. HEENT: normal. Neck: Supple, no JVD, carotid bruits, or masses. Cardiac: RRR, no murmurs, rubs, or gallops. No clubbing, cyanosis, edema.  Radials/PT 2+ and equal bilaterally.  Respiratory:  Respirations regular and unlabored, clear to auscultation bilaterally. GI: Soft, nontender, nondistended. MS: No deformity or atrophy. Skin: Warm and dry, no rash. Neuro:  Strength and sensation are intact. Psych: Normal affect.  Assessment & Plan    TAVR with endocarditis - Admitted 07/2021 with endocarditis and LAA thrombus. Not SAVR candidate. Has completed 6 weeks IV abx. Follows with ID, now on PO abx per her report. Likely to require long term suppression. ID last note 09/09/21 states follow up in 1 month with echo. Will reach out to ID to see if they want assistance coordinating echo.  Husband brings labs from 09/16/21  (unknown provider who ordered) with CRP28.1. We discussed that this is not surprising given recent admit. Will repeat CRP for monitoring due to spouse's understandable concern.   Hypokalemia - IN setting of diuretic use. Has since been started on PO potassium. Repeat BMP today for monitoring.   Permanent atrial fibrillation / Chronic anticoagulation - Allergy to beta blocker. Rate controlled on Diltiazem. No plans for cardioversion. CHA2DS2-VASc Score = 7 [CHF History: 1, HTN History: 1, Diabetes History: 1, Stroke History: 0, Vascular Disease History: 1, Age Score: 2, Gender Score: 1].  Therefore, the patient's annual risk of stroke is 11.2 %.  CBC today for monitoring to rule out anemia as contributory to dyspnea given anticoagulation. Encouraged to increase dietary sources of iron.   HTN -BP elevated today but reports well controlled at home.  Continue losartan 100 mg daily, Lasix 20 mg daily.  She will check routinely at home and report if systolic blood pressure consistently greater than 130.  If so consider addition of Amlodipine or transition to more potent ARB such as valsartan.  LE edema -improved since last seen.  Still with some fluid blisters bilateral lower extremities but no pitting edema on exam.  We will continue current dose Lasix 20 mg daily.  Encouraged to take additional 20 mg as needed for swelling or weight gain of 2 pounds overnight or 5 pounds in 1 week.  Recommend elevating lower extremities when sitting.  Recommend she wear compression stockings on her upcoming trip to [place].    Nonobstructive CAD - Stable with no anginal symptoms. No indication for  ischemic evaluation.  Continue statin. No aspirin due to anticoagulation, no BB due to allergy.  Deconditioning - Primary care has ordered PT. Hopeful to start soon. Provided list of exercises she can complete while sitting to do while waiting on PT.  HLD, LDL goal <70 -continue atorvastatin 80 mg daily and Zetia 10 mg daily.   Denies myalgias.  OSA -  CPAP compliance encouraged.   Disposition: Follow up in 1 month(s) with Fransico Him, MD or APP.  Signed, Loel Dubonnet, NP 09/25/2021, 12:40 PM [place]

## 2021-09-25 ENCOUNTER — Ambulatory Visit (HOSPITAL_BASED_OUTPATIENT_CLINIC_OR_DEPARTMENT_OTHER): Payer: Medicare Other | Admitting: Family

## 2021-09-25 ENCOUNTER — Ambulatory Visit (INDEPENDENT_AMBULATORY_CARE_PROVIDER_SITE_OTHER): Payer: Medicare Other | Admitting: Family

## 2021-09-25 ENCOUNTER — Other Ambulatory Visit: Payer: Self-pay

## 2021-09-25 ENCOUNTER — Encounter (HOSPITAL_BASED_OUTPATIENT_CLINIC_OR_DEPARTMENT_OTHER): Payer: Self-pay | Admitting: Family

## 2021-09-25 VITALS — BP 148/52 | HR 76 | Ht 67.0 in | Wt 177.0 lb

## 2021-09-25 DIAGNOSIS — T826XXD Infection and inflammatory reaction due to cardiac valve prosthesis, subsequent encounter: Secondary | ICD-10-CM

## 2021-09-25 DIAGNOSIS — I4891 Unspecified atrial fibrillation: Secondary | ICD-10-CM | POA: Diagnosis not present

## 2021-09-25 DIAGNOSIS — I4821 Permanent atrial fibrillation: Secondary | ICD-10-CM

## 2021-09-25 DIAGNOSIS — E785 Hyperlipidemia, unspecified: Secondary | ICD-10-CM

## 2021-09-25 DIAGNOSIS — I10 Essential (primary) hypertension: Secondary | ICD-10-CM

## 2021-09-25 DIAGNOSIS — I38 Endocarditis, valve unspecified: Secondary | ICD-10-CM

## 2021-09-25 DIAGNOSIS — D6869 Other thrombophilia: Secondary | ICD-10-CM | POA: Diagnosis not present

## 2021-09-25 DIAGNOSIS — E876 Hypokalemia: Secondary | ICD-10-CM

## 2021-09-25 DIAGNOSIS — I25118 Atherosclerotic heart disease of native coronary artery with other forms of angina pectoris: Secondary | ICD-10-CM

## 2021-09-25 MED ORDER — RIVAROXABAN 20 MG PO TABS: 20.0000 mg | ORAL_TABLET | Freq: Every day | ORAL | 3 refills | Status: DC

## 2021-09-25 NOTE — Patient Instructions (Signed)
Medication Instructions:  Continue your current medications.  You may take an extra Lasix tablet twice per week as needed for swelling.   *If you need a refill on your cardiac medications before your next appointment, please call your pharmacy*   Lab Work: Your physician recommends that you return for lab work today: CRP, CBC, BMP  If you have labs (blood work) drawn today and your tests are completely normal, you will receive your results only by: MyChart Message (if you have MyChart) OR A paper copy in the mail If you have any lab test that is abnormal or we need to change your treatment, we will call you to review the results.   Testing/Procedures: None ordered today.   Follow-Up: At [facility], you and your health needs are our priority.  As part of our continuing mission to provide you with exceptional heart care, we have created designated Provider Care Teams.  These Care Teams include your primary Cardiologist (physician) and Advanced Practice Providers (APPs -  Physician Assistants and Nurse Practitioners) who all work together to provide you with the care you need, when you need it.  We recommend signing up for the patient portal called "MyChart".  Sign up information is provided on this After Visit Summary.  MyChart is used to connect with patients for Virtual Visits (Telemedicine).  Patients are able to view lab/test results, encounter notes, upcoming appointments, etc.  Non-urgent messages can be sent to your provider as well.   To learn more about what you can do with MyChart, go to [email or web address].    Your next appointment:   10/21/21 at 10:05 AM with Loel Dubonnet, NP    Other Instructions  Please check BP twice per week at home. If blood pressure consistently more than 130/80 please call our office.    Iron-Rich Diet Iron is a mineral that helps your body produce hemoglobin. Hemoglobin is a protein in red blood cells that carries oxygen to your body's  tissues. Eating too little iron may cause you to feel weak and tired, and it can increase your risk of infection. Iron is naturally found in many foods, and many foods have iron added to them (are iron-fortified). You may need to follow an iron-rich diet if you do not have enough iron in your body due to certain medical conditions. The amount of iron that you need each day depends on your age, your sex, and any medical conditions you have. Follow instructions from your health care provider or a dietitian about how much iron you should eat each day. What are tips for following this plan? Reading food labels Check food labels to see how many milligrams (mg) of iron are in each serving. Cooking Cook foods in pots and pans that are made from iron. Take these steps to make it easier for your body to absorb iron from certain foods: Soak beans overnight before cooking. Soak whole grains overnight and drain them before using. Ferment flours before baking, such as by using yeast in bread dough. Meal planning When you eat foods that contain iron, you should eat them with foods that are high in vitamin C. These include oranges, peppers, tomatoes, potatoes, and mangoes. Vitamin C helps your body absorb iron. Certain foods and drinks prevent your body from absorbing iron properly. Avoid eating these foods in the same meal as iron-rich foods or with iron supplements. These foods include: Coffee, black tea, and red wine. Milk, dairy products, and foods that are high in  calcium. Beans and soybeans. Whole grains. General information Take iron supplements only as told by your health care provider. An overdose of iron can be life-threatening. If you were prescribed iron supplements, take them with orange juice or a vitamin C supplement. When you eat iron-fortified foods or take an iron supplement, you should also eat foods that naturally contain iron, such as meat, poultry, and fish. Eating naturally iron-rich  foods helps your body absorb the iron that is added to other foods or contained in a supplement. Iron from animal sources is better absorbed than iron from plant sources. What foods should I eat? Fruits Prunes. Raisins. Eat fruits high in vitamin C, such as oranges, grapefruits, and strawberries, with iron-rich foods. Vegetables Spinach (cooked). Green peas. Broccoli. Fermented vegetables. Eat vegetables high in vitamin C, such as leafy greens, potatoes, bell peppers, and tomatoes, with iron-rich foods. Grains Iron-fortified breakfast cereal. Iron-fortified whole-wheat bread. Enriched rice. Sprouted grains. Meats and other proteins Beef liver. Beef. [place]. Chicken. Oysters. Shrimp. [place]. Sardines. Chickpeas. Nuts. Tofu. Pumpkin seeds. Beverages Tomato juice. Fresh orange juice. Prune juice. Hibiscus tea. Iron-fortified instant breakfast shakes. Sweets and desserts Blackstrap molasses. Seasonings and condiments Tahini. Fermented soy sauce. Other foods Wheat germ. The items listed above may not be a complete list of recommended foods and beverages. Contact a dietitian for more information. What foods should I limit? These are foods that should be limited while eating iron-rich foods as they can reduce the absorption of iron in your body. Grains Whole grains. Bran cereal. Bran flour. Meats and other proteins Soybeans. Products made from soy protein. Black beans. Lentils. Mung beans. Split peas. Dairy Milk. Cream. Cheese. Yogurt. Cottage cheese. Beverages Coffee. Black tea. Red wine. Sweets and desserts Cocoa. Chocolate. Ice cream. Seasonings and condiments Basil. Oregano. Large amounts of parsley. The items listed above may not be a complete list of foods and beverages you should limit. Contact a dietitian for more information. Summary Iron is a mineral that helps your body produce hemoglobin. Hemoglobin is a protein in red blood cells that carries oxygen to your body's  tissues. Iron is naturally found in many foods, and many foods have iron added to them (are iron-fortified). When you eat foods that contain iron, you should eat them with foods that are high in vitamin C. Vitamin C helps your body absorb iron. Certain foods and drinks prevent your body from absorbing iron properly, such as whole grains and dairy products. You should avoid eating these foods in the same meal as iron-rich foods or with iron supplements. This information is not intended to replace advice given to you by your health care provider. Make sure you discuss any questions you have with your health care provider. Document Revised: 09/15/2020 Document Reviewed: 09/15/2020 Elsevier Patient Education  2022 [place].   Exercises to do While Sitting Exercises that you do while sitting (chair exercises) can give you many of the same benefits as full exercise. Benefits include strengthening your heart, burning calories, and keeping muscles and joints healthy. Exercise can also improve your mood and help with depression and anxiety. You may benefit from chair exercises if you are unable to do standing exercises due to: Diabetic foot pain. Obesity. Illness. Arthritis. Recovery from surgery or injury. Breathing problems. Balance problems. Another type of disability. Before starting chair exercises, check with your health care provider or a physical therapist to find out how much exercise you can tolerate and which exercises are safe for you. If your health care  provider approves: Start out slowly and build up over time. Aim to work up to about 10-20 minutes for each exercise session. Make exercise part of your daily routine. Drink water when you exercise. Do not wait until you are thirsty. Drink every 10-15 minutes. Stop exercising right away if you have pain, nausea, shortness of breath, or dizziness. If you are exercising in a wheelchair, make sure to lock the wheels. Ask your health  care provider whether you can do tai chi or yoga. Many positions in these mind-body exercises can be modified to do while seated. Warm-up Before starting other exercises: Sit up as straight as you can. Have your knees bent at 90 degrees, which is the shape of the capital letter "L." Keep your feet flat on the floor. Sit at the front edge of your chair, if you can. Pull in (tighten) the muscles in your abdomen and stretch your spine and neck as straight as you can. Hold this position for a few minutes. Breathe in and out evenly. Try to concentrate on your breathing, and relax your mind. Stretching Exercise A: Arm stretch Hold your arms out straight in front of your body. Bend your hands at the wrist with your fingers pointing up, as if signaling someone to stop. Notice the slight tension in your forearms as you hold the position. Keeping your arms out and your hands bent, rotate your hands outward as far as you can and hold this stretch. Aim to have your thumbs pointing up and your pinkie fingers pointing down. Slowly repeat arm stretches for one minute as tolerated. Exercise B: Leg stretch If you can move your legs, try to "draw" letters on the floor with the toes of your foot. Write your name with one foot. Write your name with the toes of your other foot. Slowly repeat the movements for one minute as tolerated. Exercise C: Reach for the sky Reach your hands as far over your head as you can to stretch your spine. Move your hands and arms as if you are climbing a rope. Slowly repeat the movements for one minute as tolerated. Range of motion exercises Exercise A: Shoulder roll Let your arms hang loosely at your sides. Lift just your shoulders up toward your ears, then let them relax back down. When your shoulders feel loose, rotate your shoulders in backward and forward circles. Do shoulder rolls slowly for one minute as tolerated. Exercise B: March in place As if you are marching, pump  your arms and lift your legs up and down. Lift your knees as high as you can. If you are unable to lift your knees, just pump your arms and move your ankles and feet up and down. March in place for one minute as tolerated. Exercise C: Seated jumping jacks Let your arms hang down straight. Keeping your arms straight, lift them up over your head. Aim to point your fingers to the ceiling. While you lift your arms, straighten your legs and slide your heels along the floor to your sides, as wide as you can. As you bring your arms back down to your sides, slide your legs back together. If you are unable to use your legs, just move your arms. Slowly repeat seated jumping jacks for one minute as tolerated. Strengthening exercises Exercise A: Shoulder squeeze Hold your arms straight out from your body to your sides, with your elbows bent and your fists pointed at the ceiling. Keeping your arms in the bent position, move them forward  so your elbows and forearms meet in front of your face. Open your arms back out as wide as you can with your elbows still bent, until you feel your shoulder blades squeezing together. Hold for 5 seconds. Slowly repeat the movements forward and backward for one minute as tolerated. Contact a health care provider if: You have to stop exercising due to any of the following: Pain. Nausea. Shortness of breath. Dizziness. Fatigue. You have significant pain or soreness after exercising. Get help right away if: You have chest pain. You have difficulty breathing. These symptoms may represent a serious problem that is an emergency. Do not wait to see if the symptoms will go away. Get medical help right away. Call your local emergency services (911 in the [place] Do not drive yourself to the hospital. Summary Exercises that you do while sitting (chair exercises) can strengthen your heart, burn calories, and keep muscles and joints healthy. You may benefit from chair exercises if  you are unable to do standing exercises due to diabetic foot pain, obesity, recovery from surgery or injury, or other conditions. Before starting chair exercises, check with your health care provider or a physical therapist to find out how much exercise you can tolerate and which exercises are safe for you. This information is not intended to replace advice given to you by your health care provider. Make sure you discuss any questions you have with your health care provider. Document Revised: 11/30/2020 Document Reviewed: 11/30/2020 Elsevier Patient Education  2022 [employer].

## 2021-09-26 LAB — CBC
Hematocrit: 31.6 % — ABNORMAL LOW (ref 34.0–46.6)
Hemoglobin: 9.6 g/dL — ABNORMAL LOW (ref 11.1–15.9)
MCH: 25.6 pg — ABNORMAL LOW (ref 26.6–33.0)
MCHC: 30.4 g/dL — ABNORMAL LOW (ref 31.5–35.7)
MCV: 84 fL (ref 79–97)
Platelets: 248 10*3/uL (ref 150–450)
RBC: 3.75 x10E6/uL — ABNORMAL LOW (ref 3.77–5.28)
RDW: 15.1 % (ref 11.7–15.4)
WBC: 8.7 10*3/uL (ref 3.4–10.8)

## 2021-09-26 LAB — BASIC METABOLIC PANEL
BUN/Creatinine Ratio: 17 (ref 12–28)
BUN: 15 mg/dL (ref 8–27)
CO2: 20 mmol/L (ref 20–29)
Calcium: 10.6 mg/dL — ABNORMAL HIGH (ref 8.7–10.3)
Chloride: 103 mmol/L (ref 96–106)
Creatinine, Ser: 0.89 mg/dL (ref 0.57–1.00)
Glucose: 87 mg/dL (ref 70–99)
Potassium: 4.3 mmol/L (ref 3.5–5.2)
Sodium: 143 mmol/L (ref 134–144)
eGFR: 64 mL/min/{1.73_m2} (ref >59)

## 2021-09-26 LAB — C-REACTIVE PROTEIN: CRP: 10 mg/L (ref 0–10)

## 2021-09-28 ENCOUNTER — Ambulatory Visit: Payer: Medicare Other | Admitting: Cardiology

## 2021-09-28 ENCOUNTER — Ambulatory Visit (HOSPITAL_BASED_OUTPATIENT_CLINIC_OR_DEPARTMENT_OTHER): Payer: Medicare Other | Admitting: Family

## 2021-09-28 NOTE — Progress Notes (Signed)
Seen by patient [name] on 09/28/2021  7:51 AM

## 2021-09-30 DIAGNOSIS — N390 Urinary tract infection, site not specified: Secondary | ICD-10-CM | POA: Diagnosis not present

## 2021-09-30 DIAGNOSIS — Z452 Encounter for adjustment and management of vascular access device: Secondary | ICD-10-CM | POA: Diagnosis not present

## 2021-09-30 DIAGNOSIS — A4181 Sepsis due to Enterococcus: Secondary | ICD-10-CM | POA: Diagnosis not present

## 2021-09-30 DIAGNOSIS — I110 Hypertensive heart disease with heart failure: Secondary | ICD-10-CM | POA: Diagnosis not present

## 2021-09-30 DIAGNOSIS — J188 Other pneumonia, unspecified organism: Secondary | ICD-10-CM | POA: Diagnosis not present

## 2021-09-30 DIAGNOSIS — T826XXA Infection and inflammatory reaction due to cardiac valve prosthesis, initial encounter: Secondary | ICD-10-CM | POA: Diagnosis not present

## 2021-10-06 ENCOUNTER — Encounter: Payer: Self-pay | Admitting: Infectious Diseases

## 2021-10-06 ENCOUNTER — Other Ambulatory Visit: Payer: Self-pay

## 2021-10-06 ENCOUNTER — Other Ambulatory Visit (HOSPITAL_BASED_OUTPATIENT_CLINIC_OR_DEPARTMENT_OTHER): Payer: Self-pay | Admitting: Family

## 2021-10-06 ENCOUNTER — Ambulatory Visit (INDEPENDENT_AMBULATORY_CARE_PROVIDER_SITE_OTHER): Payer: Medicare Other | Admitting: Infectious Diseases

## 2021-10-06 VITALS — BP 163/78 | HR 81 | Temp 97.2°F | Ht 67.0 in | Wt 173.8 lb

## 2021-10-06 DIAGNOSIS — R7881 Bacteremia: Secondary | ICD-10-CM

## 2021-10-06 DIAGNOSIS — Z5181 Encounter for therapeutic drug level monitoring: Secondary | ICD-10-CM

## 2021-10-06 DIAGNOSIS — J188 Other pneumonia, unspecified organism: Secondary | ICD-10-CM | POA: Diagnosis not present

## 2021-10-06 DIAGNOSIS — T826XXA Infection and inflammatory reaction due to cardiac valve prosthesis, initial encounter: Secondary | ICD-10-CM | POA: Diagnosis not present

## 2021-10-06 DIAGNOSIS — T826XXD Infection and inflammatory reaction due to cardiac valve prosthesis, subsequent encounter: Secondary | ICD-10-CM

## 2021-10-06 DIAGNOSIS — I110 Hypertensive heart disease with heart failure: Secondary | ICD-10-CM | POA: Diagnosis not present

## 2021-10-06 DIAGNOSIS — N390 Urinary tract infection, site not specified: Secondary | ICD-10-CM | POA: Diagnosis not present

## 2021-10-06 DIAGNOSIS — Z452 Encounter for adjustment and management of vascular access device: Secondary | ICD-10-CM | POA: Diagnosis not present

## 2021-10-06 DIAGNOSIS — A4181 Sepsis due to Enterococcus: Secondary | ICD-10-CM | POA: Diagnosis not present

## 2021-10-06 LAB — COMPREHENSIVE METABOLIC PANEL
AG Ratio: 1.6 (calc) (ref 1.0–2.5)
ALT: 14 U/L (ref 6–29)
AST: 17 U/L (ref 10–35)
Albumin: 4.2 g/dL (ref 3.6–5.1)
Alkaline phosphatase (APISO): 83 U/L (ref 37–153)
BUN: 19 mg/dL (ref 7–25)
CO2: 24 mmol/L (ref 20–32)
Calcium: 9.5 mg/dL (ref 8.6–10.4)
Chloride: 106 mmol/L (ref 98–110)
Creat: 0.7 mg/dL (ref 0.60–0.95)
Globulin: 2.6 g/dL (calc) (ref 1.9–3.7)
Glucose, Bld: 83 mg/dL (ref 65–99)
Potassium: 3.7 mmol/L (ref 3.5–5.3)
Sodium: 141 mmol/L (ref 135–146)
Total Bilirubin: 0.8 mg/dL (ref 0.2–1.2)
Total Protein: 6.8 g/dL (ref 6.1–8.1)

## 2021-10-06 MED ORDER — AMOXICILLIN 500 MG PO CAPS: 1000.0000 mg | ORAL_CAPSULE | Freq: Three times a day (TID) | ORAL | 1 refills | Status: DC

## 2021-10-06 NOTE — Progress Notes (Signed)
Thanks

## 2021-10-06 NOTE — Progress Notes (Signed)
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  Completed antibiotic therapy.  Reached out to ID who were appreciative of offer to repeat echo.  Echo ordered. Will route to scheduling team for assistance in scheduling.   Loel Dubonnet, NP

## 2021-10-06 NOTE — Progress Notes (Signed)
[facility]                                                             [place], [place], [place], [address]                                                                  Phn. [phone]; Fax: [phone]                                                                             Date: 10/06/21  Reason for FU : PV endocarditis    Assessment Prosthetic AV Endocarditis ( E faecalis, vegetation size 42mm) Medication Monitoring  Plan Will increase amoxicillin from 500mg  PO TID to 1000mg  PO TID to be continued indefinitely  TTE to be coordinated with Cardiology ( have staff messaged Cardiology PA) CMP today  Fu in a month before visit to [place]   All questions and concerns were discussed and addressed. Patient verbalized understanding of the plan. 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   Patient was recently hospitalized 10/18-10/25 for E faecalis bacteremia and PV endocarditis of TAVR. TEE 10/24 showed mild to moderate MVR, mild to moderate TVR, Prosthetic AV vegetation 0.8*0.7cm. Gated CTA 10/25 showed large vegetation  of the Prosthetic AV ( 59mm) without abscess with left atrial thrombus. MR brain 08/29/21 Punctate acute to subacute infarct posterior left centrum semiovale. Blood cultures cleared on 10/20. Evaluated by Cardiology and CT surgery and was considered a poor surgical candidate and was planned for 6 weeks of  IV ampicillin and ceftriaxone followed by PO antibiotic suppression life long.   Accompanied by her husband. Getting IV ceftriaxone and IV ampicillin. Denies any issues with PICC. Denies nausea, vomiting, rashes or diarrhea. Denies chest pain and cough but has SOB. She was SOB even walking 50 yards from parking lot to  the exam room. She is planning to leave to [place] in mid January after completion of her IV abtx per husband. Discussed with husband she would need to follow up with an Infectious disease doctor as well as cardiologist when they go back to [place].  10/06/21 Here for Fu for PV endocarditis. Taking amoxicillin 500mg  po tid. Denies nausea, vomiting, abdominal pain, rashes and diarrhea. Gets SOB while walking short distances or climbing stairs. She does not walk much per husband and does very minimal cooking at dishes at home because of SOB. She has a fu  with Cardiology in early January, discussed with patient and husband that echo will be coordinated by Cardiology office as per their request. Will increase dose of Amoxicillin from 500mg  PO TID to 1000mg  PO TID and fu in a month for labs. Discussed that amoxicillin would need to be continued indefinitely. They willl follow up with me one more time in mid January before their visit to [place] in 10/07/21  ROS: Constitutional: Negative for fever, chills, appetite change, fatigue and unexpected weight change.  Respiratory: Negative for cough, shortness of breath + Cardiovascular: Negative for chest pain, palpitations and leg swelling.  Gastrointestinal: Negative for nausea, vomiting, abdominal pain, diarrhea/constipation, .  Genitourinary: Negative for dysuria, hematuria, flank pain Musculoskeletal: Negative for myalgias, arthralgia, back pain, joint swelling, arthralgias Skin: Negative for rashes, lesions  Neurological: Negative for weakness, dizziness or headache  Past Medical History:  Diagnosis Date   Anginal pain (HCC)    Aortic  stenosis    severe AS with moderate AR by echo with normal LVF   Arthritis    CAD in native artery, non obstructive cath 11/2016.  12/16/2016   Cancer ([facility])    left breast mastectomy(cancer)-surgery only   Chronic diastolic congestive heart failure (HCC)    Complication of anesthesia    extremely claustophobic(only occurs with narcotic medications as stated per pt)   Diverticulosis    Dysrhythmia    Afib   Fibromyalgia    GERD (gastroesophageal reflux disease)    Hard of hearing    wears hearing aides    History of breast cancer no recurrence   dx 2004   s/p  left breast mastectomy w/ node dissection/   no chemo or radiation   History of TIA (transient ischemic attack)    per scan--  no residual   Hyperlipidemia    Hypertension    Nocturia    OSA on CPAP     severe/  AHI 34/hr   Permanent atrial fibrillation ([facility])    on Xarelto for CHADS2VASC score of 5   Pulmonary HTN ([facility])    PASP 82mmHg by echo 11/2016   Urge urinary incontinence    Wears glasses    Past Surgical History:  Procedure Laterality Date   ABDOMINOPLASTY  [place]   BREAST SURGERY Left 2004   mastectomy    CARDIAC CATHETERIZATION N/A 11/19/2016   Procedure: Right/Left Heart Cath and Coronary Angiography;  Surgeon: Troy Sine, MD;  Location: [facility];  Service: Cardiovascular;  Laterality: N/A;   CARPAL TUNNEL RELEASE Right 07-08-2004   COLONOSCOPY WITH ESOPHAGOGASTRODUODENOSCOPY (EGD)  2006   EXPLANTATION BREAST IMPLANT AND DEBRIDEMENT Left 09/15/2005   INTERSTIM IMPLANT PLACEMENT N/A 08/22/2014   Procedure: Barrie Lyme IMPLANT STAGE ONE/TWO;  Surgeon: Reece Packer, MD;  Location: [facility];  Service: Urology;  Laterality: N/A;   JOINT REPLACEMENT Left 2017   knee replacement   MASTECTOMY     MASTECTOMY, MODIFIED RADICAL W/RECONSTRUCTION  09-16-2003   left breast W/ SLN DISSECTION (pt states had 13 operations for implant infection    REPLACE TISSUE EXPANDER  LEFT BREAST AND TOTAL CAPSULECTOMY  08-05-2006   REVERSE SHOULDER ARTHROPLASTY Left 10/26/2019   Procedure: REVERSE SHOULDER ARTHROPLASTY;  Surgeon: Netta Cedars, MD;  Location: [facility];  Service: Orthopedics;  Laterality: Left;  with interscalene block   REVISION BREAST RECONSTRUCTION Left 11-11-2004   12-03-2004  DRAINAGE SEREMA LEFT CHEST   TEE WITHOUT CARDIOVERSION N/A 12/14/2016   Procedure: TRANSESOPHAGEAL ECHOCARDIOGRAM (  TEE);  Surgeon: Burnell Blanks, MD;  Location: [facility];  Service: Open Heart Surgery;  Laterality: N/A;   TEE WITHOUT CARDIOVERSION N/A 08/10/2021   Procedure: TRANSESOPHAGEAL ECHOCARDIOGRAM (TEE);  Surgeon: Buford Dresser, MD;  Location: [facility];  Service: Cardiovascular;  Laterality: N/A;   TOTAL HIP ARTHROPLASTY Left 07/17/2013   Procedure: LEFT TOTAL HIP ARTHROPLASTY ANTERIOR APPROACH;  Surgeon: Mauri Pole, MD;  Location: [facility];  Service: Orthopedics;  Laterality: Left;   TOTAL HIP ARTHROPLASTY Right 01/28/2015   Procedure: RIGHT TOTAL HIP ARTHROPLASTY ANTERIOR APPROACH;  Surgeon: Paralee Cancel, MD;  Location: [facility];  Service: Orthopedics;  Laterality: Right;   TOTAL KNEE ARTHROPLASTY Left 11/11/2015   Procedure: LEFT TOTAL KNEE ARTHROPLASTY;  Surgeon: Paralee Cancel, MD;  Location: [facility];  Service: Orthopedics;  Laterality: Left;   TOTAL SHOULDER ARTHROPLASTY  08/11/2012   Procedure: TOTAL SHOULDER ARTHROPLASTY;  Surgeon: Augustin Schooling, MD;  Location: [facility];  Service: Orthopedics;  Laterality: Right;  RIGHT  TOTAL SHOULDER  ARTHROPLASTY    TRANSCATHETER AORTIC VALVE REPLACEMENT, TRANSFEMORAL N/A 12/14/2016   Procedure: TRANSCATHETER AORTIC VALVE REPLACEMENT, TRANSFEMORAL;  Surgeon: Burnell Blanks, MD;  Location: [facility];  Service: Open Heart Surgery;  Laterality: N/A;   TRANSTHORACIC ECHOCARDIOGRAM  02-25-2014   moderate LVH/  ef 55-60%/  mod. to sev. calcification AV with mild to moderate AV stenosis/  mild to mod. AR and MR/ mild PR/    moderate to severe LAE   TUBAL LIGATION  1973   Allergies  Allergen Reactions   Beta Adrenergic Blockers Shortness Of Breath   Other     Narcotic pain medications - anxiety attacks   Oxycodone Shortness Of Breath and Other (See Comments)    Tolerates Dilaudid, Fentanyl   Sotalol Hcl Shortness Of Breath and Other (See Comments)   Tramadol Anxiety and Other (See Comments)    Can take if absolutely necessary   Clonidine Derivatives Palpitations    bradycardia   Crestor [Rosuvastatin Calcium] Other (See Comments)    MYALGIAS   Latex Rash    Per patient-bandaids- no problems with gloves   Rosuvastatin Calcium Nausea Only   Rosuvastatin    Tape Rash     bandaids    Social History   Socioeconomic History   Marital status: Married    Spouse name: Not on file   Number of children: Not on file   Years of education: Not on file   Highest education level: Not on file  Occupational History   Not on file  Tobacco Use   Smoking status: Former    Packs/day: 0.25    Years: 8.00    Pack years: 2.00    Types: Cigarettes    Quit date: 10/18/1966    Years since quitting: 55.0   Smokeless tobacco: Never  Vaping Use   Vaping Use: Never used  Substance and Sexual Activity   Alcohol use: No   Drug use: No   Sexual activity: Yes  Other Topics Concern   Not on file  Social History Narrative   Not on file   Social Determinants of Health   Financial Resource Strain: Not on file  Food Insecurity: Not on file  Transportation Needs: Not on file  Physical Activity: Not on file  Stress: Not on file  Social Connections: Not on file  Intimate Partner Violence: Not on file   Family History  Problem Relation Age of Onset   Heart disease Mother    Heart disease Father  Vitals BP (!) 163/78    Pulse 81    Temp (!) 97.2 F (36.2 C)    Ht 5\' 7"  (1.702 m)    Wt 173 lb 12.8 oz (78.8 kg)    SpO2 91%    BMI 27.22 kg/m   Examination  General - not in acute distress, comfortably  sitting in chair HEENT - PEERLA, no pallor and no icterus Chest - b/l clear air entry, no additional sounds CVS- Normal J2E2, systolic murmur+ Abdomen - Soft, Non tender , non distended Ext- no pedal edema Neuro: grossly normal Psych : calm and cooperative   Recent labs CBC Latest Ref Rng & Units 09/25/2021 09/01/2021 08/10/2021  WBC 3.4 - 10.8 x10E3/uL 8.7 8.3 11.1(H)  Hemoglobin 11.1 - 15.9 g/dL 9.6(L) 9.5(L) 9.6(L)  Hematocrit 34.0 - 46.6 % 31.6(L) 29.6(L) 29.8(L)  Platelets 150 - 450 x10E3/uL 248 159 209   CMP Latest Ref Rng & Units 09/25/2021 09/01/2021 08/10/2021  Glucose 70 - 99 mg/dL 87 88 82  BUN 8 - 27 mg/dL 15 22 12   Creatinine 0.57 - 1.00 mg/dL 0.89 0.83 0.99  Sodium 134 - 144 mmol/L 143 141 138  Potassium 3.5 - 5.2 mmol/L 4.3 3.9 3.4(L)  Chloride 96 - 106 mmol/L 103 103 105  CO2 20 - 29 mmol/L 20 22 21(L)  Calcium 8.7 - 10.3 mg/dL 10.6(H) 9.8 8.5(L)  Total Protein 6.5 - 8.1 g/dL - - -  Total Bilirubin 0.3 - 1.2 mg/dL - - -  Alkaline Phos 38 - 126 U/L - - -  AST 15 - 41 U/L - - -  ALT 0 - 44 U/L - - -     Pertinent Microbiology Results for orders placed or performed in visit on 09/04/21  SARS Coronavirus 2 (TAT 6-24 hrs)     Status: None   Collection Time: 09/04/21 12:00 AM  Result Value Ref Range Status   SARS Coronavirus 2 RESULT: NEGATIVE  Final    Comment: RESULT: NEGATIVESARS-CoV-2 INTERPRETATION:A NEGATIVE  test result means that SARS-CoV-2 RNA was not present in the specimen above the limit of detection of this test. This does not preclude a possible SARS-CoV-2 infection and should not be used as the  sole basis for patient management decisions. Negative results must be combined with clinical observations, patient history, and epidemiological information. Optimum specimen types and timing for peak viral levels during infections caused by SARS-CoV-2  have not been determined. Collection of multiple specimens or types of specimens may be necessary to detect  virus. Improper specimen collection and handling, sequence variability under primers/probes, or organism present below the limit of detection may  lead to false negative results. Positive and negative predictive values of testing are highly dependent on prevalence. False negative test results are more likely when prevalence of disease is high.The expected result is NEGATIVE.Fact S heet for  Healthcare Providers: LocalChronicle.no Sheet for Patients: SalonLookup.es Reference Range - Negative    Pertinent Imaging Gated CTA 08/11/21 IMPRESSION: 1. There is a large vegetation of the prosthetic aortic valve without abscess. Reaching out to primary team.   2.  There is a left atrial thrombus.  TEE 08/10/21 IMPRESSIONS   1. Left ventricular ejection fraction, by estimation, is 55 to 60%. The  left ventricle has normal function. The left ventricle has no regional  wall motion abnormalities.   2. Right ventricular systolic function is normal. The right ventricular  size is normal.   3. Left atrial size was severely dilated. No left atrial/left  atrial  appendage thrombus was detected.   4. Right atrial size was severely dilated.   5. The mitral valve is normal in structure. Mild to moderate mitral valve  regurgitation.   6. Tricuspid valve regurgitation is mild to moderate.   7. There is a vegetation seen on the prosthetic aortic valve, 0.8 x 0.7  cm, predominantly on the ventricular side of the aortic valve but small  filament also see on aortic side of the valve. The aortic valve has been  repaired/replaced. Aortic valve  regurgitation is trivial. There is a 26 mm Edwards Sapien prosthetic  (TAVR) valve present in the aortic position. Procedure Date: 12/14/16.   8. There is Moderate (Grade III) plaque involving the descending aorta.   Conclusion(s)/Recommendation(s): Positive study for endocarditis of  prosthetic aortic valve.    TTE 08/05/20 Left ventricular ejection fraction, by estimation, is 55 to 60%. The left ventricle has normal function. The left ventricle has no regional wall motion abnormalities. There is mild concentric left ventricular hypertrophy. Left ventricular diastolic parameters are indeterminate. 1. 2. Right ventricular systolic function is normal. The right ventricular size is normal. 3. Left atrial size was severely dilated. 4. Right atrial size was severely dilated. The mitral valve is normal in structure. No evidence of mitral valve regurgitation. No evidence of mitral stenosis. 5. 6. Tricuspid valve regurgitation is mild to moderate. The aortic valve has been repaired/replaced. Aortic valve regurgitation is not visualized. No aortic stenosis is present. There is a 26 mm Edwards Sapien prosthetic (TAVR) valve present in the aortic position. Aortic valve mean gradient measures 19.4 mmHg. Aortic valve Vmax measures 2.78 m/s. 7. The inferior vena cava is normal in size with greater  MR brain 08/18/21   IMPRESSION: Degraded by motion.   Punctate acute to subacute infarct posterior left centrum semiovale. Additional likely subacute to chronic centrum semiovale infarcts bilaterally.   Moderate chronic microvascular ischemic changes. Chronic small vessel infarcts. Scattered chronic microhemorrhages likely secondary to hypertension.  All pertinent labs/Imagings/notes reviewed. All pertinent plain films and CT images have been personally visualized and interpreted; radiology reports have been reviewed. Decision making incorporated into the Impression / Recommendations.  I have spent a total of 40  minutes of face-to-face and non-face-to-face time, excluding clinical staff time, preparing to see patient, ordering tests and/or medications, and provide counseling the patient    Electronically signed by:  Rosiland Oz, MD Infectious Disease Physician [facility] [address] [place], [place] [address] Phone: [phone]   Fax: [phone]

## 2021-10-07 DIAGNOSIS — Z452 Encounter for adjustment and management of vascular access device: Secondary | ICD-10-CM | POA: Diagnosis not present

## 2021-10-07 DIAGNOSIS — T826XXA Infection and inflammatory reaction due to cardiac valve prosthesis, initial encounter: Secondary | ICD-10-CM | POA: Diagnosis not present

## 2021-10-07 DIAGNOSIS — A4181 Sepsis due to Enterococcus: Secondary | ICD-10-CM | POA: Diagnosis not present

## 2021-10-07 DIAGNOSIS — J188 Other pneumonia, unspecified organism: Secondary | ICD-10-CM | POA: Diagnosis not present

## 2021-10-07 DIAGNOSIS — N390 Urinary tract infection, site not specified: Secondary | ICD-10-CM | POA: Diagnosis not present

## 2021-10-07 DIAGNOSIS — I110 Hypertensive heart disease with heart failure: Secondary | ICD-10-CM | POA: Diagnosis not present

## 2021-10-07 NOTE — Progress Notes (Signed)
[facility]  PATIENT: [name] DOB: 11/22/1938  REFERRING CLINICIAN: Koirala, Dibas, MD HISTORY FROM: self, husband REASON FOR VISIT: memory loss   HISTORICAL  CHIEF COMPLAINT:  Chief Complaint  Patient presents with   Memory Loss    Rm 6 New Pt  husbandPilar Plate  MMSE 21    HISTORY OF PRESENT ILLNESS:  The patient presents for evaluation of memory loss which has been present over the past couple of years, but has gradually worsened over the past year. Long term memory is intact, but struggles with short term memory. She repeats stories 5 minutes after telling them and will forget conversations she had a few minutes ago. Misplaces objects (accidentally threw away her hearing aids, lost half of her interlocking wedding ring and 2 other rings).   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 MRI 08/18/21 showed an acute-subacute infarct in the left centrum semiovale, plus additional subacute-chronic centrum ovale infarcts bilaterally, a remote right parietal stroke, and multiple remote lacunar strokes.  TBI:  No past history of TBI Stroke: Hx of centrum semiovale strokes, lacunar infarcts Seizures: no past history of seizures Sleep: Trouble sleeping at night, wakes up in the middle of the night due to leg cramps. Diagnosed with OSA but forgets to use CPAP Mood: Were stuck in the [place] for 14 months during the pandemic which led to worsening anxiety. She takes hydroxyzine which does help.  Functional status:  Patient lives with her husband Cooking: daughter does most of the cooking, patient has not left the stove on Cleaning: daughter has a cleaning lady come in to help in [place], she does help clean her house in [place] Shopping: shops with her husband and daughter, has forgotten to buy things at the store occasionally Driving: doesn't drive in the [place]. Missed a familiar turn several times, did not get lost while driving Bills: husband  handles finances Medications: was forgetting to take medications and would mix days in her pill box up. Husband helps manage her medications Ever left the stove on by accident?: no Forget how to use items around the house?: no Getting lost going to familiar places?: no Forgetting loved ones names?: no Word finding difficulty? no  OTHER MEDICAL CONDITIONS: HTN, aortic stenosis, CHF, CAD, afib on xarelto, OSA   REVIEW OF SYSTEMS: Full 14 system review of systems performed and negative with exception of: memory loss  ALLERGIES: Allergies  Allergen Reactions   Beta Adrenergic Blockers Shortness Of Breath   Other     Narcotic pain medications - anxiety attacks   Oxycodone Shortness Of Breath and Other (See Comments)    Tolerates Dilaudid, Fentanyl   Sotalol Hcl Shortness Of Breath and Other (See Comments)   Tramadol Anxiety and Other (See Comments)    Can take if absolutely necessary   Clonidine Derivatives Palpitations    bradycardia   Crestor [Rosuvastatin Calcium] Other (See Comments)    MYALGIAS   Latex Rash    Per patient-bandaids- no problems with gloves   Rosuvastatin Calcium Nausea Only   Rosuvastatin    Tape Rash     bandaids     HOME MEDICATIONS: Outpatient Medications Prior to Visit  Medication Sig Dispense Refill   alendronate (FOSAMAX) 70 MG tablet Take 70 mg by mouth every Saturday.     amoxicillin (AMOXIL) 500 MG capsule Take 2 capsules (1,000 mg total) by mouth 3 (three) times daily. 180 capsule 1   atorvastatin (LIPITOR) 80 MG tablet Take  1 tablet (80 mg total) by mouth daily. 90 tablet 3   Cholecalciferol (VITAMIN D3) 50 MCG (2000 UT) capsule Take 2,000 Units by mouth daily.     DILT-XR 240 MG 24 hr capsule Take 240 mg by mouth every morning.     ezetimibe (ZETIA) 10 MG tablet Take 1 tablet (10 mg total) by mouth daily. 90 tablet 3   furosemide (LASIX) 20 MG tablet Take 20 mg by mouth daily.      hydrOXYzine (ATARAX/VISTARIL) 25 MG tablet Take 25 mg by mouth  every 12 (twelve) hours as needed for anxiety.     losartan (COZAAR) 100 MG tablet Take 1 tablet (100 mg total) by mouth daily. 90 tablet 3   Melatonin 10 MG TABS Take 10 mg by mouth at bedtime as needed (for sleep).      nitroGLYCERIN (NITROSTAT) 0.4 MG SL tablet Place 0.4 mg under the tongue every 5 (five) minutes as needed for chest pain.      Nutritional Supplements (VITAMIN D BOOSTER PO) Take 800 Units by mouth at bedtime. Kalcipos-d forte     omeprazole (PRILOSEC) 20 MG capsule Take 20 mg by mouth daily as needed (acid reflux/indigestion.).      potassium chloride SA (KLOR-CON M) 20 MEQ tablet Take 1 tablet (20 mEq total) by mouth daily. 90 tablet 3   predniSONE (DELTASONE) 5 MG tablet Take 5 mg by mouth daily with breakfast.     rivaroxaban (XARELTO) 20 MG TABS tablet Take 1 tablet (20 mg total) by mouth daily with supper. 90 tablet 3   triamcinolone ointment (KENALOG) 0.1 % Apply 1 application topically 2 (two) times daily as needed (affected areas of feet).      No facility-administered medications prior to visit.    PAST MEDICAL HISTORY: Past Medical History:  Diagnosis Date   Anginal pain ([place])    Aortic stenosis    severe AS with moderate AR by echo with normal LVF   Arthritis    CAD in native artery, non obstructive cath 11/2016.  12/16/2016   Cancer ([place])    left breast mastectomy(cancer)-surgery only   Chronic diastolic congestive heart failure (HCC)    Complication of anesthesia    extremely claustophobic(only occurs with narcotic medications as stated per pt)   Diverticulosis    Dysrhythmia    Afib   Fibromyalgia    GERD (gastroesophageal reflux disease)    Hard of hearing    wears hearing aides    History of breast cancer no recurrence   dx 2004   s/p  left breast mastectomy w/ node dissection/   no chemo or radiation   History of TIA (transient ischemic attack)    per scan--  no residual   Hyperlipidemia    Hypertension    Nocturia    OSA on CPAP     severe/   AHI 34/hr   Permanent atrial fibrillation ([place])    on Xarelto for CHADS2VASC score of 5   Pulmonary HTN ([place])    PASP 8mmHg by echo 11/2016   Urge urinary incontinence    Wears glasses     PAST SURGICAL HISTORY: Past Surgical History:  Procedure Laterality Date   ABDOMINOPLASTY  [place]   BREAST SURGERY Left 2004   mastectomy    CARDIAC CATHETERIZATION N/A 11/19/2016   Procedure: Right/Left Heart Cath and Coronary Angiography;  Surgeon: Troy Sine, MD;  Location: [facility];  Service: Cardiovascular;  Laterality: N/A;  CARPAL TUNNEL RELEASE Right 07-08-2004   COLONOSCOPY WITH ESOPHAGOGASTRODUODENOSCOPY (EGD)  2006   EXPLANTATION BREAST IMPLANT AND DEBRIDEMENT Left 09/15/2005   INTERSTIM IMPLANT PLACEMENT N/A 08/22/2014   Procedure: [place] IMPLANT STAGE ONE/TWO;  Surgeon: Reece Packer, MD;  [facility]  Service: Urology;  Laterality: N/A;   JOINT REPLACEMENT Left 2017   knee replacement   MASTECTOMY     MASTECTOMY, MODIFIED RADICAL W/RECONSTRUCTION  09-16-2003   left breast W/ SLN DISSECTION (pt states had 13 operations for implant infection    REPLACE TISSUE EXPANDER LEFT BREAST AND TOTAL CAPSULECTOMY  08-05-2006   REVERSE SHOULDER ARTHROPLASTY Left 10/26/2019   Procedure: REVERSE SHOULDER ARTHROPLASTY;  Surgeon: Netta Cedars, MD;  Location: [facility];  Service: Orthopedics;  Laterality: Left;  with interscalene block   REVISION BREAST RECONSTRUCTION Left 11-11-2004   12-03-2004  DRAINAGE SEREMA LEFT CHEST   TEE WITHOUT CARDIOVERSION N/A 12/14/2016   Procedure: TRANSESOPHAGEAL ECHOCARDIOGRAM (TEE);  Surgeon: Burnell Blanks, MD;  Location: [facility];  Service: Open Heart Surgery;  Laterality: N/A;   TEE WITHOUT CARDIOVERSION N/A 08/10/2021   Procedure: TRANSESOPHAGEAL ECHOCARDIOGRAM (TEE);  Surgeon: Buford Dresser, MD;  Location: [facility];  Service: Cardiovascular;  Laterality: N/A;   TOTAL HIP ARTHROPLASTY  Left 07/17/2013   Procedure: LEFT TOTAL HIP ARTHROPLASTY ANTERIOR APPROACH;  Surgeon: Mauri Pole, MD;  Location: [facility];  Service: Orthopedics;  Laterality: Left;   TOTAL HIP ARTHROPLASTY Right 01/28/2015   Procedure: RIGHT TOTAL HIP ARTHROPLASTY ANTERIOR APPROACH;  Surgeon: Paralee Cancel, MD;  Location: [facility];  Service: Orthopedics;  Laterality: Right;   TOTAL KNEE ARTHROPLASTY Left 11/11/2015   Procedure: LEFT TOTAL KNEE ARTHROPLASTY;  Surgeon: Paralee Cancel, MD;  Location: [facility];  Service: Orthopedics;  Laterality: Left;   TOTAL SHOULDER ARTHROPLASTY  08/11/2012   Procedure: TOTAL SHOULDER ARTHROPLASTY;  Surgeon: Augustin Schooling, MD;  Location: [facility];  Service: Orthopedics;  Laterality: Right;  RIGHT  TOTAL SHOULDER  ARTHROPLASTY    TRANSCATHETER AORTIC VALVE REPLACEMENT, TRANSFEMORAL N/A 12/14/2016   Procedure: TRANSCATHETER AORTIC VALVE REPLACEMENT, TRANSFEMORAL;  Surgeon: Burnell Blanks, MD;  Location: [place];  Service: Open Heart Surgery;  Laterality: N/A;   TRANSTHORACIC ECHOCARDIOGRAM  02-25-2014   moderate LVH/  ef 55-60%/  mod. to sev. calcification AV with mild to moderate AV stenosis/  mild to mod. AR and MR/ mild PR/   moderate to severe LAE   TUBAL LIGATION  1973    FAMILY HISTORY: Family History  Problem Relation Age of Onset   Heart disease Mother    Heart disease Father     SOCIAL HISTORY: Social History   Socioeconomic History   Marital status: Married    Spouse name: Pilar Plate   Number of children: 5   Years of education: Not on file   Highest education level: Associate degree: academic program  Occupational History   Not on file  Tobacco Use   Smoking status: Former    Packs/day: 0.25    Years: 8.00    Pack years: 2.00    Types: Cigarettes    Quit date: 10/18/1966    Years since quitting: 55.0   Smokeless tobacco: Never  Vaping Use   Vaping Use: Never used  Substance and Sexual Activity   Alcohol use: No   Drug use: No   Sexual activity: Yes   Other Topics Concern   Not on file  Social History Narrative   10/08/21 lives with husband   Social Determinants  of Health   Financial Resource Strain: Not on file  Food Insecurity: Not on file  Transportation Needs: Not on file  Physical Activity: Not on file  Stress: Not on file  Social Connections: Not on file  Intimate Partner Violence: Not on file     PHYSICAL EXAM  GENERAL EXAM/CONSTITUTIONAL: Vitals:  Vitals:   10/08/21 1004  BP: 131/72  Pulse: 69  Weight: 175 lb 6.4 oz (79.6 kg)  Height: 5' 8.5" (1.74 m)   Body mass index is 26.28 kg/m. Wt Readings from Last 3 Encounters:  10/08/21 175 lb 6.4 oz (79.6 kg)  10/06/21 173 lb 12.8 oz (78.8 kg)  09/25/21 177 lb (80.3 kg)   Patient is in no distress; well developed, nourished and groomed; neck is supple  CARDIOVASCULAR: Examination of peripheral vascular system by observation and palpation is normal  EYES: Pupils round and reactive to light, Visual fields full to confrontation, Extraocular movements intacts,   MUSCULOSKELETAL: Gait, strength, tone, movements noted in Neurologic exam below  NEUROLOGIC: MENTAL STATUS:  MMSE - [place] Exam 10/08/2021  Orientation to time 3  Orientation to Place 3  Registration 3  Attention/ Calculation 1  Recall 1  Language- name 2 objects 2  Language- repeat 0  Language- follow 3 step command 3  Language- read & follow direction 1  Write a sentence 1  Copy design 1  Total score 19    CRANIAL NERVE:  2nd, 3rd, 4th, 6th - pupils equal and reactive to light, visual fields full to confrontation, extraocular muscles intact, no nystagmus 5th - facial sensation symmetric 7th - facial strength symmetric 8th - hearing intact 9th - palate elevates symmetrically, uvula midline 11th - shoulder shrug symmetric 12th - tongue protrusion midline  MOTOR:  normal bulk and tone, mild rigidity RUE>LUE, full strength in the BUE, BLE  SENSORY:  normal and symmetric to  light touch all 4 extremities  COORDINATION:  finger-nose-finger, fine finger movements normal, no tremor  REFLEXES:  deep tendon reflexes present and symmetric  GAIT/STATION:  Slow gait with mildly decreased stride length     DIAGNOSTIC DATA (LABS, IMAGING, TESTING) - I reviewed patient records, labs, notes, testing and imaging myself where available.  Lab Results  Component Value Date   WBC 8.7 09/25/2021   HGB 9.6 (L) 09/25/2021   HCT 31.6 (L) 09/25/2021   MCV 84 09/25/2021   PLT 248 09/25/2021      Component Value Date/Time   NA 141 10/06/2021 0957   NA 143 09/25/2021 1041   K 3.7 10/06/2021 0957   CL 106 10/06/2021 0957   CO2 24 10/06/2021 0957   GLUCOSE 83 10/06/2021 0957   BUN 19 10/06/2021 0957   BUN 15 09/25/2021 1041   CREATININE 0.70 10/06/2021 0957   CALCIUM 9.5 10/06/2021 0957   PROT 6.8 10/06/2021 0957   PROT 7.1 02/02/2021 1113   ALBUMIN 2.5 (L) 08/05/2021 0646   ALBUMIN 4.7 (H) 02/02/2021 1113   AST 17 10/06/2021 0957   ALT 14 10/06/2021 0957   ALKPHOS 61 08/05/2021 0646   BILITOT 0.8 10/06/2021 0957   BILITOT 0.7 02/02/2021 1113   GFRNONAA 57 (L) 08/10/2021 0109   GFRAA 73 02/05/2020 0734   Lab Results  Component Value Date   CHOL 148 02/02/2021   HDL 78 02/02/2021   LDLCALC 55 02/02/2021   TRIG 76 02/02/2021   CHOLHDL 1.9 02/02/2021   Lab Results  Component Value Date   HGBA1C 5.6 12/10/2016  No results found for: VITAMINB12 Lab Results  Component Value Date   TSH 1.570 01/24/2012    TSH 1.15 on 07/22/21  MRI 08/18/21 with punctate acute-subacute infarct posterior left centrum semiovale, subacute-chronic centrum semiovale infarcts bilaterally, chronic right parietal infarct  ASSESSMENT AND PLAN  85 y.o. year old female with a history of lacunar and parietal strokes, HTN, aortic stenosis, CHF, CAD, afib on xarelto, OSA, recent infective endocarditis c/b CVA who presents for evaluation of memory loss over the past 1-2 years. Her  MMSE today is 19/30. Exam with mild rigidity and slowing of gait. Her clinical presentation is concerning for vascular dementia. Discussed importance of modifying vascular risk factors and encouraged her to wear CPAP at night. Will refer for neuropsychological testing to better characterize her cognitive deficits and establish a baseline. Husband notes they are leaving for [place] at the end of January, advised that we may not be able to get her scheduled with neuropsych by that time. Discussed starting a memory medication, but patient would prefer to hold off as they are leaving the country soon and are not sure when they will return.    1. Memory loss     PLAN: - Labs: B12 - Referral for neuropsych testing - Encouraged to wear CPAP at night - Follow up as needed when they return to [place] from [place]  Orders Placed This Encounter  Procedures   Vitamin B12   Ambulatory referral to Neuropsychology     Return if symptoms worsen or fail to improve.  I spent an average of 42 minutes chart reviewing and counseling the patient, with at least 50% of the time face to face with the patient. Reviewed safety measures including driving safety (she will avoid driving).   Genia Harold, MD 10/08/21 11:07 AM  [facility] [address] [place], [place] [address] [phone]

## 2021-10-08 ENCOUNTER — Other Ambulatory Visit: Payer: Self-pay

## 2021-10-08 ENCOUNTER — Telehealth: Payer: Self-pay | Admitting: Psychiatry

## 2021-10-08 ENCOUNTER — Encounter: Payer: Self-pay | Admitting: Psychiatry

## 2021-10-08 ENCOUNTER — Ambulatory Visit (INDEPENDENT_AMBULATORY_CARE_PROVIDER_SITE_OTHER): Payer: Medicare Other | Admitting: Psychiatry

## 2021-10-08 VITALS — BP 131/72 | HR 69 | Ht 68.5 in | Wt 175.4 lb

## 2021-10-08 DIAGNOSIS — R413 Other amnesia: Secondary | ICD-10-CM

## 2021-10-08 NOTE — Telephone Encounter (Signed)
Neuropsych referral sent to [facility]. Phone: [phone]. 

## 2021-10-08 NOTE — Patient Instructions (Addendum)
Blood work to check B12 level Referral for neuropsychological testing Wear CPAP at night, this can help reduce stroke risk  Follow up as needed when you return to [place]

## 2021-10-09 LAB — VITAMIN B12: Vitamin B-12: 714 pg/mL (ref 232–1245)

## 2021-10-20 NOTE — Progress Notes (Signed)
Office Visit    Patient Name: [name] Date of Encounter: 10/21/2021  PCP:  Lujean Amel, [place]  Cardiologist:  Fransico Him, MD  Advanced Practice Provider:  No care team member to display Electrophysiologist:  None      Chief Complaint    [name] is a 86 y.o. female with a hx of chronic atrial fibrillation, chronic diastolic heart failure, nonobstructive coronary disease, aortic stenosis s/p TAVR 2018, HTN, HLD, OSA on CPAP, GERD, breast cancer, obesity presents today for follow up of endocarditis, diastolic heart failure   Past Medical History    Past Medical History:  Diagnosis Date   Anginal pain ([place])    Aortic stenosis    severe AS with moderate AR by echo with normal LVF   Arthritis    CAD in native artery, non obstructive cath 11/2016.  12/16/2016   Cancer ([place])    left breast mastectomy(cancer)-surgery only   Chronic diastolic congestive heart failure (HCC)    Complication of anesthesia    extremely claustophobic(only occurs with narcotic medications as stated per pt)   Diverticulosis    Dysrhythmia    Afib   Fibromyalgia    GERD (gastroesophageal reflux disease)    Hard of hearing    wears hearing aides    History of breast cancer no recurrence   dx 2004   s/p  left breast mastectomy w/ node dissection/   no chemo or radiation   History of TIA (transient ischemic attack)    per scan--  no residual   Hyperlipidemia    Hypertension    Nocturia    OSA on CPAP     severe/  AHI 34/hr   Permanent atrial fibrillation ([place])    on Xarelto for CHADS2VASC score of 5   Pulmonary HTN ([place])    PASP 96mmHg by echo 11/2016   Urge urinary incontinence    Wears glasses    Past Surgical History:  Procedure Laterality Date   ABDOMINOPLASTY  [place]   BREAST SURGERY Left 2004   mastectomy    CARDIAC CATHETERIZATION N/A 11/19/2016   Procedure: Right/Left Heart Cath and Coronary Angiography;   Surgeon: Troy Sine, MD;  Location: [place] [facility];  Service: Cardiovascular;  Laterality: N/A;   CARPAL TUNNEL RELEASE Right 07-08-2004   COLONOSCOPY WITH ESOPHAGOGASTRODUODENOSCOPY (EGD)  2006   EXPLANTATION BREAST IMPLANT AND DEBRIDEMENT Left 09/15/2005   INTERSTIM IMPLANT PLACEMENT N/A 08/22/2014   Procedure: Barrie Lyme IMPLANT STAGE ONE/TWO;  Surgeon: Reece Packer, MD;  Location: [facility];  Service: Urology;  Laterality: N/A;   JOINT REPLACEMENT Left 2017   knee replacement   MASTECTOMY     MASTECTOMY, MODIFIED RADICAL W/RECONSTRUCTION  09-16-2003   left breast W/ SLN DISSECTION (pt states had 13 operations for implant infection    REPLACE TISSUE EXPANDER LEFT BREAST AND TOTAL CAPSULECTOMY  08-05-2006   REVERSE SHOULDER ARTHROPLASTY Left 10/26/2019   Procedure: REVERSE SHOULDER ARTHROPLASTY;  Surgeon: Netta Cedars, MD;  Location: [facility];  Service: Orthopedics;  Laterality: Left;  with interscalene block   REVISION BREAST RECONSTRUCTION Left 11-11-2004   12-03-2004  DRAINAGE SEREMA LEFT CHEST   TEE WITHOUT CARDIOVERSION N/A 12/14/2016   Procedure: TRANSESOPHAGEAL ECHOCARDIOGRAM (TEE);  Surgeon: Burnell Blanks, MD;  Location: [place];  Service: Open Heart Surgery;  Laterality: N/A;   TEE WITHOUT CARDIOVERSION N/A 08/10/2021   Procedure: TRANSESOPHAGEAL ECHOCARDIOGRAM (TEE);  Surgeon: Buford Dresser, MD;  Location: [facility];  Service: Cardiovascular;  Laterality: N/A;   TOTAL HIP ARTHROPLASTY Left 07/17/2013   Procedure: LEFT TOTAL HIP ARTHROPLASTY ANTERIOR APPROACH;  Surgeon: Mauri Pole, MD;  Location: [facility];  Service: Orthopedics;  Laterality: Left;   TOTAL HIP ARTHROPLASTY Right 01/28/2015   Procedure: RIGHT TOTAL HIP ARTHROPLASTY ANTERIOR APPROACH;  Surgeon: Paralee Cancel, MD;  Location: [facility];  Service: Orthopedics;  Laterality: Right;   TOTAL KNEE ARTHROPLASTY Left 11/11/2015   Procedure: LEFT TOTAL KNEE ARTHROPLASTY;  Surgeon: Paralee Cancel, MD;  Location: [facility];  Service: Orthopedics;  Laterality: Left;   TOTAL SHOULDER ARTHROPLASTY  08/11/2012   Procedure: TOTAL SHOULDER ARTHROPLASTY;  Surgeon: Augustin Schooling, MD;  Location: [place];  Service: Orthopedics;  Laterality: Right;  RIGHT  TOTAL SHOULDER  ARTHROPLASTY    TRANSCATHETER AORTIC VALVE REPLACEMENT, TRANSFEMORAL N/A 12/14/2016   Procedure: TRANSCATHETER AORTIC VALVE REPLACEMENT, TRANSFEMORAL;  Surgeon: Burnell Blanks, MD;  Location: [place];  Service: Open Heart Surgery;  Laterality: N/A;   TRANSTHORACIC ECHOCARDIOGRAM  02-25-2014   moderate LVH/  ef 55-60%/  mod. to sev. calcification AV with mild to moderate AV stenosis/  mild to mod. AR and MR/ mild PR/   moderate to severe LAE   TUBAL LIGATION  1973    Allergies  Allergies  Allergen Reactions   Beta Adrenergic Blockers Shortness Of Breath   Other     Narcotic pain medications - anxiety attacks   Oxycodone Shortness Of Breath and Other (See Comments)    Tolerates Dilaudid, Fentanyl   Sotalol Hcl Shortness Of Breath and Other (See Comments)   Tramadol Anxiety and Other (See Comments)    Can take if absolutely necessary   Clonidine Derivatives Palpitations    bradycardia   Crestor [Rosuvastatin Calcium] Other (See Comments)    MYALGIAS   Latex Rash    Per patient-bandaids- no problems with gloves   Rosuvastatin Calcium Nausea Only   Rosuvastatin    Tape Rash     bandaids     History of Present Illness    [name] is a 86 y.o. female with a hx of chronic atrial fibrillation, chronic diastolic heart failure, nonobstructive coronary disease, aortic stenosis s/p TAVR 2018, HTN, HLD, OSA on CPAP, GERD, breast cancer, obesity last seen 09/25/21  She had echo 5/202 with LVEF 60-65%, moderate LVH, moderately elevated PA pressures, severe biatrial enlargement, aortic stenosis s/p TAVR. This was overall stable compared to previous. She had worsening pulmonary hypertension and PFTs recommended for  VQ scan which was negative. 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 Cardiac CT with evidence of large vegetation and LAA thrombus. Not candidate for SAVR and on abx per ID. Suspected she will need chronic suppression.   She was seen in follow up 09/01/21 by Estella Husk, PA noting persistent dyspnea and fatigue. Notes her  dyspnea predated her hospitalization. She noted edema and her Lasix was increased to 40mg  x3 days then return to 20mg  QD.  Saw ID 09/09/21 with plan to complete 6 weeks IV ampicillin on 12/1 then remove PICC. Then transition to Amoxicilin 500mg  PO BID starting 12/2 with plan to uptitrate to 1000mg  BID if tolerated. She has follow up in 1 month with ID with plan for TTE at that time.   She was seen 09/25/21. Her dyspnea was unchanged but was walking stairs in her home for exercise. She was  hopeful to start home health therapies. CRP from outside provider of 28.1 was reviewed with her and her spouse who were concerned. Discussed that it was expected finding. CRP was repeated and was normal.   09/16/21 labs at [facility] Hb 8.6, Hct 27,wbc 7.7 Na 147, K 2.8, Ch 107, ca 7.8 Crp 28.1  She presents today for follow up with her husband. Her follow up echocardiogram is scheduled for tomorrow. Per ID she is likely to remain on life long antibiotics. She is tolerating amoxicillin without difficulty. Reports no shortness of breath at rest and that her dyspnea on exertion is improving. Reports no chest pain, pressure, or tightness. No edema, orthopnea, PND. Reports no palpitations.  She was not able to establish with PT but has been walking her stairs at home for exercise.   EKGs/Labs/Other Studies Reviewed:   The following studies were reviewed today: TEE 09-04-21: LEFT VENTRICLE: EF = 55-60%.  No regional wall motion abnormalities.  RIGHT VENTRICLE: Normal size and function.    LEFT ATRIUM: No thrombus/mass. Severely  dilated   LEFT ATRIAL APPENDAGE: No thrombus/mass.    RIGHT ATRIUM: No thrombus/mass. Severely dilated   AORTIC VALVE:  S/P TAVR with Edwards Sapien 3, 57mm (date 12/14/2016). There is a vegetation seen on the prosthetic aortic valve, small in size, predominantly on the ventricular side of the aortic valve but small filament also see on aortic side of the valve. There is trivial regurgitation.   MITRAL VALVE:    Normal structure. Mild regurgitation. No vegetation.   TRICUSPID VALVE: Normal structure. MIld regurgitation. No vegetation.   PULMONIC VALVE: Grossly normal structure. Mild regurgitation. No apparent vegetation.   INTERATRIAL SEPTUM: No PFO or ASD seen by color Doppler.   PERICARDIUM: No effusion noted.   DESCENDING AORTA: Moderate diffuse plaque seen    CONCLUSION: Positive study for endocarditis of prosthetic aortic valve. Findings will be communicated to primary team.     Echocardiogram 08/05/21: 1. Left ventricular ejection fraction, by estimation, is 55 to 60%. The  left ventricle has normal function. The left ventricle has no regional  wall motion abnormalities. There is mild concentric left ventricular  hypertrophy. Left ventricular diastolic  parameters are indeterminate.   2. Right ventricular systolic function is normal. The right ventricular  size is normal.   3. Left atrial size was severely dilated.   4. Right atrial size was severely dilated.   5. The mitral valve is normal in structure. No evidence of mitral valve  regurgitation. No evidence of mitral stenosis.   6. Tricuspid valve regurgitation is mild to moderate.   7. The aortic valve has been repaired/replaced. Aortic valve  regurgitation is not visualized. No aortic stenosis is present. There is a  26 mm Edwards Sapien prosthetic (TAVR) valve present in the aortic  position. Aortic valve mean gradient measures 19.4   mmHg. Aortic valve Vmax measures 2.78 m/s.   8. The inferior vena cava is normal  in size with greater than 50%  respiratory variability, suggesting right atrial pressure of 3 mmHg.   Echocardiogram 02/2021: 1. Left ventricular ejection fraction, by estimation, is 60 to 65%. The  left ventricle has normal function. The left ventricle has no regional  wall motion abnormalities. There is moderate left ventricular hypertrophy.  Left ventricular diastolic  parameters are indeterminate.   2. Right ventricular systolic function is normal. The right ventricular  size is mildly enlarged. There is moderately elevated pulmonary artery  systolic pressure. The estimated right ventricular systolic pressure  is  50.0 mmHg.   3. Left atrial size was severely dilated.   4. Right atrial size was severely dilated.   5. The mitral valve is normal in structure. Trivial mitral valve  regurgitation.   6. The aortic valve has been repaired/replaced. Aortic valve  regurgitation is not visualized. There is a 26 mm Edwards Sapien  prosthetic (TAVR) valve present in the aortic position. Procedure Date:  12/14/16. Echo findings are consistent with normal  structure and function of the aortic valve prosthesis. MG 65mmHg, stable  from prior echo 08/22/19   Comparison(s): 08/22/19 EF 60-65%. AV 2mmHg mean PG, 39mmHg peak PG.    Coronary CTAIMPRESSION: 1. There is a large vegetation of the prosthetic aortic valve without abscess. Reaching out to primary team.   2.  There is a left atrial thrombus.   RECOMMENDATIONS:   Coronary artery calcium (CAC) score is a strong predictor of incident coronary heart disease (CHD) and provides predictive information beyond traditional risk factors. CAC scoring is reasonable to use in the decision to withhold, postpone, or initiate statin therapy in intermediate-risk or selected borderline-risk asymptomatic adults (age 21-75 years and LDL-C >=70 to <190 mg/dL) who do not have diabetes or established atherosclerotic cardiovascular disease (ASCVD).* In  intermediate-risk (10-year ASCVD risk >=7.5% to <20%) adults or selected borderline-risk (10-year ASCVD risk >=5% to <7.5%) adults in whom a CAC score is measured for the purpose of making a treatment decision the following recommendations have been made:  EKG: No EKG today  Recent Labs: 08/04/2021: B Natriuretic Peptide 474.6 08/10/2021: Magnesium 1.6 09/25/2021: Hemoglobin 9.6; Platelets 248 10/06/2021: ALT 14; BUN 19; Creat 0.70; Potassium 3.7; Sodium 141  Recent Lipid Panel    Component Value Date/Time   CHOL 148 02/02/2021 1113   TRIG 76 02/02/2021 1113   HDL 78 02/02/2021 1113   CHOLHDL 1.9 02/02/2021 1113   LDLCALC 55 02/02/2021 1113   Risk Assessment/Calculations:   CHA2DS2-VASc Score = 7  This indicates a 11.2% annual risk of stroke. The patient's score is based upon: CHF History: 1 HTN History: 1 Diabetes History: 1 Stroke History: 0 Vascular Disease History: 1 Age Score: 2 Gender Score: 1    Home Medications   Current Meds  Medication Sig   alendronate (FOSAMAX) 70 MG tablet Take 70 mg by mouth every Saturday.   amoxicillin (AMOXIL) 500 MG capsule Take 2 capsules (1,000 mg total) by mouth 3 (three) times daily.   Cholecalciferol (VITAMIN D3) 50 MCG (2000 UT) capsule Take 2,000 Units by mouth daily.   hydrOXYzine (ATARAX/VISTARIL) 25 MG tablet Take 25 mg by mouth every 12 (twelve) hours as needed for anxiety.   Melatonin 10 MG TABS Take 10 mg by mouth at bedtime as needed (for sleep).    nitroGLYCERIN (NITROSTAT) 0.4 MG SL tablet Place 0.4 mg under the tongue every 5 (five) minutes as needed for chest pain.    Nutritional Supplements (VITAMIN D BOOSTER PO) Take 800 Units by mouth at bedtime. Kalcipos-d forte   omeprazole (PRILOSEC) 20 MG capsule Take 20 mg by mouth daily as needed (acid reflux/indigestion.).    predniSONE (DELTASONE) 5 MG tablet Take 5 mg by mouth daily with breakfast.   rivaroxaban (XARELTO) 20 MG TABS tablet Take 1 tablet (20 mg total) by  mouth daily with supper.   triamcinolone ointment (KENALOG) 0.1 % Apply 1 application topically 2 (two) times daily as needed (affected areas of feet).    [DISCONTINUED] atorvastatin (LIPITOR) 80 MG tablet Take 1 tablet (80  mg total) by mouth daily.   [DISCONTINUED] DILT-XR 240 MG 24 hr capsule Take 240 mg by mouth every morning.   [DISCONTINUED] furosemide (LASIX) 20 MG tablet Take 20 mg by mouth daily.    [DISCONTINUED] losartan (COZAAR) 100 MG tablet Take 1 tablet (100 mg total) by mouth daily.   [DISCONTINUED] potassium chloride SA (KLOR-CON M) 20 MEQ tablet Take 1 tablet (20 mEq total) by mouth daily.     Review of Systems      All other systems reviewed and are otherwise negative except as noted above.  Physical Exam    VS:  BP (!) 144/78 (BP Location: Left Arm, Patient Position: Sitting, Cuff Size: Large)    Pulse 90    Ht 5' 8.5" (1.74 m)    Wt 170 lb 1.6 oz (77.2 kg)    SpO2 99%    BMI 25.49 kg/m  , BMI Body mass index is 25.49 kg/m.  Wt Readings from Last 3 Encounters:  10/21/21 170 lb 1.6 oz (77.2 kg)  10/08/21 175 lb 6.4 oz (79.6 kg)  10/06/21 173 lb 12.8 oz (78.8 kg)     GEN: Well developed, frail-appearing, in no acute distress. HEENT: normal. Neck: Supple, no JVD, carotid bruits, or masses. Cardiac: RRR, no murmurs, rubs, or gallops. No clubbing, cyanosis, edema.  Radials/PT 2+ and equal bilaterally.  Respiratory:  Respirations regular and unlabored, clear to auscultation bilaterally. GI: Soft, nontender, nondistended. MS: No deformity or atrophy. Skin: Warm and dry, no rash. Neuro:  Strength and sensation are intact. Psych: Normal affect.  Assessment & Plan    TAVR with endocarditis - Admitted 07/2021 with endocarditis and LAA thrombus. Not SAVR candidate. Has completed 6 weeks IV abx. Now on PO amoxicillin with plan for long term suppression. Repeat echo upcoming 10/22/21.   Hypokalemia - 10/06/21 K 3.7. Continue potassium. Refill provided.   Permanent atrial  fibrillation / Chronic anticoagulation - Allergy to beta blocker. Rate controlled on Diltiazem. No plans for cardioversion as she is asymptomatic in regard to atrial fib. CHA2DS2-VASc Score = 7 [CHF History: 1, HTN History: 1, Diabetes History: 1, Stroke History: 0, Vascular Disease History: 1, Age Score: 2, Gender Score: 1].  Therefore, the patient's annual risk of stroke is 11.2 %.  Continue Xarelto. Denies bleeding complications.   HTN -BP elevated today but reports well controlled at home.  Continue losartan 100 mg daily, Lasix 20 mg daily.  She will check routinely at home and report if systolic blood pressure consistently greater than 130.  If so consider addition of Amlodipine or transition to more potent ARB such as valsartan.  LE edema -Resolved. We will continue current dose Lasix 20 mg daily.  Encouraged to take additional 20 mg as needed for swelling or weight gain of 2 pounds overnight or 5 pounds in 1 week.  Recommend elevating lower extremities when sitting.  Recommend she wear compression stockings on her upcoming trip to [place].    Nonobstructive CAD - Stable with no anginal symptoms. No indication for ischemic evaluation.  Continue statin. No aspirin due to anticoagulation, no BB due to allergy.  HLD, LDL goal <70 -continue atorvastatin 80 mg daily and Zetia 10 mg daily.  Denies myalgias.  OSA -  CPAP compliance encouraged.   Medication management - Refills of all cardiac meds provided for 90 day supply to ensure she has medications while she awaits appt with her general practitioner in [place].  Disposition: Follow up in 6 months or when returned from [place]  with Fransico Him, MD or APP.  Signed, Loel Dubonnet, NP 10/21/2021, 10:32 AM [place]

## 2021-10-21 ENCOUNTER — Ambulatory Visit (INDEPENDENT_AMBULATORY_CARE_PROVIDER_SITE_OTHER): Payer: Medicare Other | Admitting: Family

## 2021-10-21 ENCOUNTER — Encounter (HOSPITAL_BASED_OUTPATIENT_CLINIC_OR_DEPARTMENT_OTHER): Payer: Self-pay | Admitting: Family

## 2021-10-21 ENCOUNTER — Other Ambulatory Visit: Payer: Self-pay

## 2021-10-21 VITALS — BP 144/78 | HR 90 | Ht 68.5 in | Wt 170.1 lb

## 2021-10-21 DIAGNOSIS — I4891 Unspecified atrial fibrillation: Secondary | ICD-10-CM | POA: Diagnosis not present

## 2021-10-21 DIAGNOSIS — T826XXD Infection and inflammatory reaction due to cardiac valve prosthesis, subsequent encounter: Secondary | ICD-10-CM | POA: Diagnosis not present

## 2021-10-21 DIAGNOSIS — E782 Mixed hyperlipidemia: Secondary | ICD-10-CM | POA: Diagnosis not present

## 2021-10-21 DIAGNOSIS — D6869 Other thrombophilia: Secondary | ICD-10-CM | POA: Diagnosis not present

## 2021-10-21 DIAGNOSIS — G4733 Obstructive sleep apnea (adult) (pediatric): Secondary | ICD-10-CM

## 2021-10-21 DIAGNOSIS — I10 Essential (primary) hypertension: Secondary | ICD-10-CM | POA: Diagnosis not present

## 2021-10-21 DIAGNOSIS — I38 Endocarditis, valve unspecified: Secondary | ICD-10-CM

## 2021-10-21 DIAGNOSIS — Z79899 Other long term (current) drug therapy: Secondary | ICD-10-CM

## 2021-10-21 DIAGNOSIS — I4821 Permanent atrial fibrillation: Secondary | ICD-10-CM

## 2021-10-21 DIAGNOSIS — E876 Hypokalemia: Secondary | ICD-10-CM

## 2021-10-21 MED ORDER — DILT-XR 240 MG PO CP24: 240.0000 mg | ORAL_CAPSULE | Freq: Every morning | ORAL | 3 refills | Status: AC

## 2021-10-21 MED ORDER — LOSARTAN POTASSIUM 100 MG PO TABS: 100.0000 mg | ORAL_TABLET | Freq: Every day | ORAL | 3 refills | Status: AC

## 2021-10-21 MED ORDER — ATORVASTATIN CALCIUM 80 MG PO TABS: 80.0000 mg | ORAL_TABLET | Freq: Every day | ORAL | 3 refills | Status: AC

## 2021-10-21 MED ORDER — EZETIMIBE 10 MG PO TABS: 10.0000 mg | ORAL_TABLET | Freq: Every day | ORAL | 3 refills | Status: AC

## 2021-10-21 MED ORDER — POTASSIUM CHLORIDE CRYS ER 20 MEQ PO TBCR: 20.0000 meq | EXTENDED_RELEASE_TABLET | Freq: Every day | ORAL | 3 refills | Status: DC

## 2021-10-21 MED ORDER — FUROSEMIDE 20 MG PO TABS: 20.0000 mg | ORAL_TABLET | Freq: Every day | ORAL | 3 refills | Status: AC

## 2021-10-21 NOTE — Patient Instructions (Signed)
Medication Instructions:  Your Physician recommend you continue on your current medication as directed.    *If you need a refill on your cardiac medications before your next appointment, please call your pharmacy*   Lab Work: None ordered today   Testing/Procedures: Proceed with Echocardiogram scheduled for tomorrow 1/5    Follow-Up: At [facility], you and your health needs are our priority.  As part of our continuing mission to provide you with exceptional heart care, we have created designated Provider Care Teams.  These Care Teams include your primary Cardiologist (physician) and Advanced Practice Providers (APPs -  Physician Assistants and Nurse Practitioners) who all work together to provide you with the care you need, when you need it.  We recommend signing up for the patient portal called "MyChart".  Sign up information is provided on this After Visit Summary.  MyChart is used to connect with patients for Virtual Visits (Telemedicine).  Patients are able to view lab/test results, encounter notes, upcoming appointments, etc.  Non-urgent messages can be sent to your provider as well.   To learn more about what you can do with MyChart, go to [email or web address].    Your next appointment:   6 month(s)  The format for your next appointment:   In Person  Provider:   Fransico Him, MD     Other Instructions We have refilled all of your cardiac medications for you!

## 2021-10-22 ENCOUNTER — Ambulatory Visit (INDEPENDENT_AMBULATORY_CARE_PROVIDER_SITE_OTHER): Payer: Medicare Other

## 2021-10-22 DIAGNOSIS — I38 Endocarditis, valve unspecified: Secondary | ICD-10-CM

## 2021-10-22 DIAGNOSIS — T826XXD Infection and inflammatory reaction due to cardiac valve prosthesis, subsequent encounter: Secondary | ICD-10-CM | POA: Diagnosis not present

## 2021-10-22 LAB — ECHOCARDIOGRAM COMPLETE
AR max vel: 0.61 cm2
AV Area VTI: 0.55 cm2
AV Area mean vel: 0.51 cm2
AV Mean grad: 12 mmHg
AV Peak grad: 21.8 mmHg
Ao pk vel: 2.34 m/s
Area-P 1/2: 3.77 cm2
MV M vel: 5.2 m/s
MV Peak grad: 108.2 mmHg

## 2021-10-23 ENCOUNTER — Ambulatory Visit (HOSPITAL_BASED_OUTPATIENT_CLINIC_OR_DEPARTMENT_OTHER): Payer: Medicare Other | Admitting: Family

## 2021-10-26 DIAGNOSIS — Z79899 Other long term (current) drug therapy: Secondary | ICD-10-CM | POA: Diagnosis not present

## 2021-10-26 DIAGNOSIS — F01B4 Vascular dementia, moderate, with anxiety: Secondary | ICD-10-CM | POA: Diagnosis not present

## 2021-10-26 DIAGNOSIS — D6869 Other thrombophilia: Secondary | ICD-10-CM | POA: Diagnosis not present

## 2021-10-26 DIAGNOSIS — R7303 Prediabetes: Secondary | ICD-10-CM | POA: Diagnosis not present

## 2021-10-26 DIAGNOSIS — Z952 Presence of prosthetic heart valve: Secondary | ICD-10-CM | POA: Diagnosis not present

## 2021-10-26 DIAGNOSIS — F419 Anxiety disorder, unspecified: Secondary | ICD-10-CM | POA: Diagnosis not present

## 2021-10-26 DIAGNOSIS — E782 Mixed hyperlipidemia: Secondary | ICD-10-CM | POA: Diagnosis not present

## 2021-10-26 DIAGNOSIS — E611 Iron deficiency: Secondary | ICD-10-CM | POA: Diagnosis not present

## 2021-10-26 DIAGNOSIS — I4891 Unspecified atrial fibrillation: Secondary | ICD-10-CM | POA: Diagnosis not present

## 2021-10-26 DIAGNOSIS — I272 Pulmonary hypertension, unspecified: Secondary | ICD-10-CM | POA: Diagnosis not present

## 2021-10-27 ENCOUNTER — Other Ambulatory Visit (HOSPITAL_COMMUNITY): Payer: Self-pay

## 2021-10-27 ENCOUNTER — Encounter: Payer: Self-pay | Admitting: Infectious Diseases

## 2021-10-27 ENCOUNTER — Other Ambulatory Visit: Payer: Self-pay

## 2021-10-27 MED ORDER — AMOXICILLIN 500 MG PO CAPS: 1000.0000 mg | ORAL_CAPSULE | Freq: Three times a day (TID) | ORAL | 0 refills | Status: AC

## 2021-10-27 NOTE — Telephone Encounter (Signed)
Received fax from [place]. It states patient refused an appointment with them at this time because patient will be leaving the country for an extended period of time, patient will contact their office when she returns in fall 2023.

## 2021-11-05 ENCOUNTER — Other Ambulatory Visit: Payer: Self-pay

## 2021-11-05 ENCOUNTER — Ambulatory Visit (INDEPENDENT_AMBULATORY_CARE_PROVIDER_SITE_OTHER): Payer: Medicare Other | Admitting: Infectious Diseases

## 2021-11-05 ENCOUNTER — Encounter: Payer: Self-pay | Admitting: Infectious Diseases

## 2021-11-05 VITALS — BP 147/79 | HR 80 | Temp 98.2°F | Wt 174.0 lb

## 2021-11-05 DIAGNOSIS — Z969 Presence of functional implant, unspecified: Secondary | ICD-10-CM

## 2021-11-05 DIAGNOSIS — I38 Endocarditis, valve unspecified: Secondary | ICD-10-CM

## 2021-11-05 DIAGNOSIS — Z5181 Encounter for therapeutic drug level monitoring: Secondary | ICD-10-CM | POA: Diagnosis not present

## 2021-11-05 DIAGNOSIS — T826XXD Infection and inflammatory reaction due to cardiac valve prosthesis, subsequent encounter: Secondary | ICD-10-CM | POA: Diagnosis not present

## 2021-11-05 LAB — BASIC METABOLIC PANEL
BUN: 22 mg/dL (ref 7–25)
CO2: 30 mmol/L (ref 20–32)
Calcium: 9.7 mg/dL (ref 8.6–10.4)
Chloride: 103 mmol/L (ref 98–110)
Creat: 0.87 mg/dL (ref 0.60–0.95)
Glucose, Bld: 89 mg/dL (ref 65–99)
Potassium: 3.3 mmol/L — ABNORMAL LOW (ref 3.5–5.3)
Sodium: 141 mmol/L (ref 135–146)

## 2021-11-05 NOTE — Progress Notes (Addendum)
[facility]                                                             [place], [place], [place], [address]                                                                  Phn. 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  All questions and concerns were discussed and addressed. Patient verbalized understanding of the plan. ____________________________________________________________________________________________________________________ Subjective/Interval Events 11/05/21 Here for follow up for PV endocarditis. Taking Amoxicillin as instructed. Denies missing doses per husband. Denies nausea, vomiting and diarrhea. TTE 10/22/21 no obvious vegetations seen. Seen by Neurology recently for memory loss and was thought to have vascular dementia. Patient and her husband are traveling back to [place] in this Saturday and plan to come back around end of September. They have already received 3 month worth of amoxicillin with them. Discussed with husband to establish care with an ID doctor for monitoring while on long term antibiotics like lab check for renal function. No new concerns at this time.   ROS: negative for fevers, chills  Negative for nausea, vomiting and diarrhea All other systems reviewed and are negative   Past Medical History:  Diagnosis Date   Anginal pain (HCC)    Aortic stenosis     severe AS with moderate AR by echo with normal LVF   Arthritis    CAD in native artery, non obstructive cath 11/2016.  12/16/2016   Cancer ([place])    left breast mastectomy(cancer)-surgery only   Chronic diastolic congestive heart failure ([place])    Complication of anesthesia    extremely claustophobic(only occurs with narcotic medications as stated per pt)   Diverticulosis    Dysrhythmia    Afib   Fibromyalgia    GERD (gastroesophageal reflux disease)    Hard of hearing    wears hearing aides    History of breast cancer no recurrence   dx 2004   s/p  left breast mastectomy w/ node dissection/   no chemo or radiation   History of TIA (transient ischemic attack)    per  scan--  no residual   Hyperlipidemia    Hypertension    Nocturia    OSA on CPAP     severe/  AHI 34/hr   Permanent atrial fibrillation (HCC)    on Xarelto for CHADS2VASC score of 5   Pulmonary HTN ([place])    PASP 30mmHg by echo 11/2016   Urge urinary incontinence    Wears glasses    Past Surgical History:  Procedure Laterality Date   ABDOMINOPLASTY  [place] Left 2004   mastectomy    CARDIAC CATHETERIZATION N/A 11/19/2016   Procedure: Right/Left Heart Cath and Coronary Angiography;  Surgeon: Troy Sine, MD;  Location: [facility];  Service: Cardiovascular;  Laterality: N/A;   CARPAL TUNNEL RELEASE Right 07-08-2004   COLONOSCOPY WITH ESOPHAGOGASTRODUODENOSCOPY (EGD)  2006   EXPLANTATION BREAST IMPLANT AND DEBRIDEMENT Left 09/15/2005   INTERSTIM IMPLANT PLACEMENT N/A 08/22/2014   Procedure: Barrie Lyme IMPLANT STAGE ONE/TWO;  Surgeon: Reece Packer, MD;  [facility];  Service: Urology;  Laterality: N/A;   JOINT REPLACEMENT Left 2017   knee replacement   MASTECTOMY     MASTECTOMY, MODIFIED RADICAL W/RECONSTRUCTION  09-16-2003   left breast W/ SLN DISSECTION (pt states had 13 operations for implant infection    REPLACE TISSUE EXPANDER LEFT BREAST  AND TOTAL CAPSULECTOMY  08-05-2006   REVERSE SHOULDER ARTHROPLASTY Left 10/26/2019   Procedure: REVERSE SHOULDER ARTHROPLASTY;  Surgeon: Netta Cedars, MD;  Location: [facility];  Service: Orthopedics;  Laterality: Left;  with interscalene block   REVISION BREAST RECONSTRUCTION Left 11-11-2004   12-03-2004  DRAINAGE SEREMA LEFT CHEST   TEE WITHOUT CARDIOVERSION N/A 12/14/2016   Procedure: TRANSESOPHAGEAL ECHOCARDIOGRAM (TEE);  Surgeon: Burnell Blanks, MD;  Location: [facility];  Service: Open Heart Surgery;  Laterality: N/A;   TEE WITHOUT CARDIOVERSION N/A 08/10/2021   Procedure: TRANSESOPHAGEAL ECHOCARDIOGRAM (TEE);  Surgeon: Buford Dresser, MD;  Location[facility];  Service: Cardiovascular;  Laterality: N/A;   TOTAL HIP ARTHROPLASTY Left 07/17/2013   Procedure: LEFT TOTAL HIP ARTHROPLASTY ANTERIOR APPROACH;  Surgeon: Mauri Pole, MD;  Location: [facility];  Service: Orthopedics;  Laterality: Left;   TOTAL HIP ARTHROPLASTY Right 01/28/2015   Procedure: RIGHT TOTAL HIP ARTHROPLASTY ANTERIOR APPROACH;  Surgeon: Paralee Cancel, MD;  Location: [facility];  Service: Orthopedics;  Laterality: Right;   TOTAL KNEE ARTHROPLASTY Left 11/11/2015   Procedure: LEFT TOTAL KNEE ARTHROPLASTY;  Surgeon: Paralee Cancel, MD;  Location: [facility];  Service: Orthopedics;  Laterality: Left;   TOTAL SHOULDER ARTHROPLASTY  08/11/2012   Procedure: TOTAL SHOULDER ARTHROPLASTY;  Surgeon: Augustin Schooling, MD;  Location: [facility];  Service: Orthopedics;  Laterality: Right;  RIGHT  TOTAL SHOULDER  ARTHROPLASTY    TRANSCATHETER AORTIC VALVE REPLACEMENT, TRANSFEMORAL N/A 12/14/2016   Procedure: TRANSCATHETER AORTIC VALVE REPLACEMENT, TRANSFEMORAL;  Surgeon: Burnell Blanks, MD;  Location: [facility];  Service: Open Heart Surgery;  Laterality: N/A;   TRANSTHORACIC ECHOCARDIOGRAM  02-25-2014   moderate LVH/  ef 55-60%/  mod. to sev. calcification AV with mild to moderate AV stenosis/  mild to mod. AR and MR/ mild PR/   moderate to severe  LAE   TUBAL LIGATION  1973   Allergies  Allergen Reactions   Beta Adrenergic Blockers Shortness Of Breath   Other     Narcotic pain medications - anxiety attacks   Oxycodone Shortness Of Breath and Other (See Comments)    Tolerates Dilaudid, Fentanyl  Sotalol Hcl Shortness Of Breath and Other (See Comments)   Tramadol Anxiety and Other (See Comments)    Can take if absolutely necessary   Clonidine Derivatives Palpitations    bradycardia   Crestor [Rosuvastatin Calcium] Other (See Comments)    MYALGIAS   Latex Rash    Per patient-bandaids- no problems with gloves   Rosuvastatin Calcium Nausea Only   Rosuvastatin    Tape Rash     bandaids    Social History   Socioeconomic History   Marital status: Married    Spouse name: Pilar Plate   Number of children: 5   Years of education: Not on file   Highest education level: Associate degree: academic program  Occupational History   Not on file  Tobacco Use   Smoking status: Former    Packs/day: 0.25    Years: 8.00    Pack years: 2.00    Types: Cigarettes    Quit date: 10/18/1966    Years since quitting: 55.0   Smokeless tobacco: Never  Vaping Use   Vaping Use: Never used  Substance and Sexual Activity   Alcohol use: No   Drug use: No   Sexual activity: Yes  Other Topics Concern   Not on file  Social History Narrative   10/08/21 lives with husband   Social Determinants of Health   Financial Resource Strain: Not on file  Food Insecurity: Not on file  Transportation Needs: Not on file  Physical Activity: Not on file  Stress: Not on file  Social Connections: Not on file  Intimate Partner Violence: Not on file   Family History  Problem Relation Age of Onset   Heart disease Mother    Heart disease Father      Vitals BP (!) 147/79    Pulse 80    Temp 98.2 F (36.8 C) (Temporal)    Wt 174 lb (78.9 kg)    BMI 26.07 kg/m   Examination  General - not in acute distress, comfortably sitting in chair HEENT - PEERLA, no  pallor and no icterus Chest - b/l clear air entry, no additional sounds CVS- Normal K4Y1, systolic murmur+ Abdomen - Soft, Non tender , non distended Ext- no pedal edema Neuro: grossly normal Psych : calm and cooperative   Recent labs CBC Latest Ref Rng & Units 09/25/2021 09/01/2021 08/10/2021  WBC 3.4 - 10.8 x10E3/uL 8.7 8.3 11.1(H)  Hemoglobin 11.1 - 15.9 g/dL 9.6(L) 9.5(L) 9.6(L)  Hematocrit 34.0 - 46.6 % 31.6(L) 29.6(L) 29.8(L)  Platelets 150 - 450 x10E3/uL 248 159 209   CMP Latest Ref Rng & Units 10/06/2021 09/25/2021 09/01/2021  Glucose 65 - 99 mg/dL 83 87 88  BUN 7 - 25 mg/dL 19 15 22   Creatinine 0.60 - 0.95 mg/dL 0.70 0.89 0.83  Sodium 135 - 146 mmol/L 141 143 141  Potassium 3.5 - 5.3 mmol/L 3.7 4.3 3.9  Chloride 98 - 110 mmol/L 106 103 103  CO2 20 - 32 mmol/L 24 20 22   Calcium 8.6 - 10.4 mg/dL 9.5 10.6(H) 9.8  Total Protein 6.1 - 8.1 g/dL 6.8 - -  Total Bilirubin 0.2 - 1.2 mg/dL 0.8 - -  Alkaline Phos 38 - 126 U/L - - -  AST 10 - 35 U/L 17 - -  ALT 6 - 29 U/L 14 - -     Pertinent Microbiology Results for orders placed or performed in visit on 09/04/21  SARS Coronavirus 2 (TAT 6-24 hrs)     Status: None  Collection Time: 09/04/21 12:00 AM  Result Value Ref Range Status   SARS Coronavirus 2 RESULT: NEGATIVE  Final    Comment: RESULT: NEGATIVESARS-CoV-2 INTERPRETATION:A NEGATIVE  test result means that SARS-CoV-2 RNA was not present in the specimen above the limit of detection of this test. This does not preclude a possible SARS-CoV-2 infection and should not be used as the  sole basis for patient management decisions. Negative results must be combined with clinical observations, patient history, and epidemiological information. Optimum specimen types and timing for peak viral levels during infections caused by SARS-CoV-2  have not been determined. Collection of multiple specimens or types of specimens may be necessary to detect virus. Improper specimen collection and  handling, sequence variability under primers/probes, or organism present below the limit of detection may  lead to false negative results. Positive and negative predictive values of testing are highly dependent on prevalence. False negative test results are more likely when prevalence of disease is high.The expected result is NEGATIVE.Fact S heet for  Healthcare Providers: LocalChronicle.no Sheet for Patients: SalonLookup.es Reference Range - Negative    Pertinent Imaging TTE 10/22/21 IMPRESSIONS    1. No obvious vegetations detected.   2. Left ventricular ejection fraction by 3D volume is 60 %. The left  ventricle has normal function. The left ventricle has no regional wall  motion abnormalities.   3. Right ventricular systolic function is normal. The right ventricular  size is mildly enlarged. There is mildly elevated pulmonary artery  systolic pressure.   4. Left atrial size was severely dilated.   5. Right atrial size was severely dilated.   6. The mitral valve is normal in structure. Mild mitral valve  regurgitation. No evidence of mitral stenosis.   7. The aortic valve has been repaired/replaced. Aortic valve  regurgitation is not visualized. No aortic stenosis is present. There is a  26 mm Sapien prosthetic (TAVR) valve present in the aortic position. Echo  findings are consistent with normal  structure and function of the aortic valve prosthesis. Aortic valve mean  gradient measures 12.0 mmHg. Aortic valve Vmax measures 2.34 m/s.   8. The inferior vena cava is dilated in size with <50% respiratory  variability, suggesting right atrial pressure of 15 mmHg.   Comparison(s): EF 55%, left and right atrium severly dilated, mild to  moderate MR and TR. Vegetation .8 x .7 cm seen on prosthetiv AOV. Trivial  AI.   I have spent a total of 40  minutes of face-to-face and non-face-to-face time, including clinical staff time,  preparing to see patient, ordering tests and/or medications, and provide counseling the patient.   Electronically signed by:  Rosiland Oz, MD Infectious Disease Physician [facility] [address] [place], [place] [address] Phone: [phone]   Fax: [phone]

## 2021-11-06 ENCOUNTER — Telehealth (HOSPITAL_BASED_OUTPATIENT_CLINIC_OR_DEPARTMENT_OTHER): Payer: Self-pay

## 2021-11-06 DIAGNOSIS — E876 Hypokalemia: Secondary | ICD-10-CM

## 2021-11-06 MED ORDER — POTASSIUM CHLORIDE CRYS ER 20 MEQ PO TBCR: 20.0000 meq | EXTENDED_RELEASE_TABLET | Freq: Every day | ORAL | 3 refills | Status: DC

## 2021-11-06 NOTE — Progress Notes (Signed)
Labs look OK, except potassium slightly low, 3.3. She is on furosemide as well as potassium supplements and losartan. Patient already follows with cardiology and would advise to check with them  if any adjustments in medications needed.

## 2021-11-06 NOTE — Telephone Encounter (Addendum)
Spoke with Mr. Pryce (ok per DPR) he will inform patient of results. Refill sent in!   Per Laurann Montana have patient double potassium for three days and then resume to normal dosing!    ----- Message from Loel Dubonnet, NP sent at 11/06/2021  8:46 AM EST ----- Potassium slightly low when checked by ID. Recommend increase potassium to twice per day for 3 days then return to once daily dosing.   CCing Dr. [place] Bali just as Juluis Rainier. MyChart message sent to patient

## 2022-04-13 IMAGING — NM NM PULMONARY PERF PARTICULATE
8 series · 8 of 8 positions shown · non-contrast
Comparison: None.

CLINICAL DATA: Short of breath. Chest pain. Emphysema COPD. Asthma.

EXAM:
NUCLEAR MEDICINE PERFUSION LUNG SCAN
TECHNIQUE: Perfusion images were obtained in multiple projections after
intravenous injection of radiopharmaceutical.
RADIOPHARMACEUTICALS:  4.4 mCi Gc-GGm MAA

[Series 1: ant/post perf · 4.14mm/px · 1 of 1 slices shown (1 of 2)]
[im 1/1]
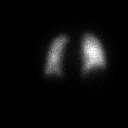

[Series 1: ant/post perf · 4.14mm/px · 1 of 1 slices shown (2 of 2)]
[im 1/1]
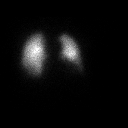

[Series 2: lao/rpo perf · 4.14mm/px · 1 of 1 slices shown (1 of 2)]
[im 1/1]
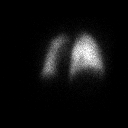

[Series 2: lao/rpo perf · 4.14mm/px · 1 of 1 slices shown (2 of 2)]
[im 1/1]
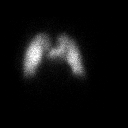

[Series 3: lpo/rao perf · 4.14mm/px · 1 of 1 slices shown (1 of 2)]
[im 1/1]
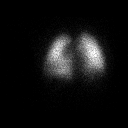

[Series 3: lpo/rao perf · 4.14mm/px · 1 of 1 slices shown (2 of 2)]
[im 1/1]
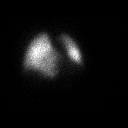

[Series 4: lt lat/rt lat perf · 4.14mm/px · 1 of 1 slices shown (1 of 2)]
[im 1/1]
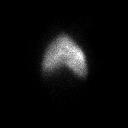

[Series 4: lt lat/rt lat perf · 4.14mm/px · 1 of 1 slices shown (2 of 2)]
[im 1/1]
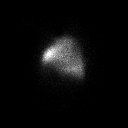

[8 of 8 positions shown; findings below may reference images not displayed]

FINDINGS: No wedge-shaped peripheral perfusion defect within LEFT or RIGHT
lung to suggest acute pulmonary embolism. Artifact from shoulder
prosthetics present on the oblique projections.
IMPRESSION: No evidence acute pulmonary embolism.

## 2022-04-14 ENCOUNTER — Telehealth: Payer: Self-pay

## 2022-04-14 NOTE — Telephone Encounter (Signed)
Patient's husband called asking for refill on amoxicillin. Will route to provider.   Beryle Flock, RN

## 2022-04-21 NOTE — Telephone Encounter (Signed)
Interview: 06/08/2022 Testing: 06/08/2022 Feedback: 06/17/2022

## 2022-05-12 ENCOUNTER — Other Ambulatory Visit: Payer: Self-pay

## 2022-05-12 ENCOUNTER — Encounter: Payer: Self-pay | Admitting: Infectious Diseases

## 2022-05-12 ENCOUNTER — Ambulatory Visit (INDEPENDENT_AMBULATORY_CARE_PROVIDER_SITE_OTHER): Payer: Medicare Other | Admitting: Infectious Diseases

## 2022-05-12 VITALS — BP 125/67 | HR 53 | Temp 97.7°F | Wt 168.0 lb

## 2022-05-12 DIAGNOSIS — Z5181 Encounter for therapeutic drug level monitoring: Secondary | ICD-10-CM

## 2022-05-12 DIAGNOSIS — T826XXD Infection and inflammatory reaction due to cardiac valve prosthesis, subsequent encounter: Secondary | ICD-10-CM

## 2022-05-12 DIAGNOSIS — Z969 Presence of functional implant, unspecified: Secondary | ICD-10-CM

## 2022-05-12 DIAGNOSIS — I38 Endocarditis, valve unspecified: Secondary | ICD-10-CM | POA: Diagnosis not present

## 2022-05-12 MED ORDER — AMOXICILLIN 500 MG PO CAPS: 1000.0000 mg | ORAL_CAPSULE | Freq: Three times a day (TID) | ORAL | 11 refills | Status: DC

## 2022-05-12 NOTE — Progress Notes (Addendum)
[facility]                                                             [address], [place], [place], [address]                                                                  Phn. 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 Patient verbalized understanding of the plan. ____________________________________________________________________________________________________________________ Subjective/Interval Events 11/05/21 Here for follow up for PV endocarditis. Accompanied by her husband. They said they had to come back from [place] early in June than previously scheduled in September as they had a family emergency. She has been taking Amoxicillin 1g po three times a day. Denies missing doses or concerns. Denies nausea, vomiting and diarrhea. She was also seen by doctor while as [place] who advised against life long po amoxicillin for suppression for PV endocarditis. However, patient and husband did not feel convinced about it given she is not a surgical candidate and wants to continue PO amoxicillin as she has been doing well on it. They are however, planning to move to [place] forever, likely December 2023.   Blood work at [place] 02/03/22 Cr 120mcromol/L ( 45-90 micromol/L)   ROS:  All other systems reviewed and are negative except as above  Past Medical History:  Diagnosis Date   Anginal pain (HCC)    Aortic stenosis     severe AS with moderate AR by echo with normal LVF   Arthritis    CAD in native artery, non obstructive cath 11/2016.  12/16/2016   Cancer ([place]    left breast mastectomy(cancer)-surgery only   Chronic diastolic congestive heart failure (HCC)    Complication of anesthesia    extremely claustophobic(only occurs with narcotic medications as stated per pt)   Diverticulosis    Dysrhythmia    Afib   Fibromyalgia    GERD (gastroesophageal reflux disease)    Hard of hearing    wears hearing aides    History of breast cancer no recurrence   dx 2004   s/p  left breast mastectomy w/ node dissection/   no chemo or radiation   History of TIA (transient  ischemic attack)    per scan--  no residual   Hyperlipidemia    Hypertension    Nocturia    OSA on CPAP     severe/  AHI 34/hr   Permanent atrial fibrillation (HCC)    on Xarelto for CHADS2VASC score of 5   Pulmonary HTN ([place])    PASP 12mHg by echo 11/2016   Urge urinary incontinence    Wears glasses    Past Surgical History:  Procedure Laterality Date   ABDOMINOPLASTY  1[place] 2004   mastectomy    CARDIAC CATHETERIZATION N/A 11/19/2016   Procedure: Right/Left Heart Cath and Coronary Angiography;  Surgeon: TTroy Sine MD;  [facility];  Service: Cardiovascular;  Laterality: N/A;   CARPAL TUNNEL RELEASE Right 07-08-2004   COLONOSCOPY WITH ESOPHAGOGASTRODUODENOSCOPY (EGD)  2006   EXPLANTATION BREAST IMPLANT AND DEBRIDEMENT Left 09/15/2005   INTERSTIM IMPLANT PLACEMENT N/A 08/22/2014   Procedure: IBarrie LymeIMPLANT STAGE ONE/TWO;  Surgeon: SReece Packer MD;  [facility]  Service: Urology;  Laterality: N/A;   JOINT REPLACEMENT Left 2017   knee replacement   MASTECTOMY     MASTECTOMY, MODIFIED RADICAL W/RECONSTRUCTION  09-16-2003   left breast W/ SLN DISSECTION (pt states had 13 operations for implant infection    REPLACE TISSUE EXPANDER LEFT BREAST  AND TOTAL CAPSULECTOMY  08-05-2006   REVERSE SHOULDER ARTHROPLASTY Left 10/26/2019   Procedure: REVERSE SHOULDER ARTHROPLASTY;  Surgeon: NNetta Cedars MD;  [facility];  Service: Orthopedics;  Laterality: Left;  with interscalene block   REVISION BREAST RECONSTRUCTION Left 11-11-2004   12-03-2004  DRAINAGE SEREMA LEFT CHEST   TEE WITHOUT CARDIOVERSION N/A 12/14/2016   Procedure: TRANSESOPHAGEAL ECHOCARDIOGRAM (TEE);  Surgeon: CBurnell Blanks MD;  [facility]  Service: Open Heart Surgery;  Laterality: N/A;   TEE WITHOUT CARDIOVERSION N/A 08/10/2021   Procedure: TRANSESOPHAGEAL ECHOCARDIOGRAM (TEE);  Surgeon: CBuford Dresser MD;  [facility];  Service: Cardiovascular;  Laterality: N/A;   TOTAL HIP ARTHROPLASTY Left 07/17/2013   Procedure: LEFT TOTAL HIP ARTHROPLASTY ANTERIOR APPROACH;  Surgeon: MMauri Pole MD;  [facility];  Service: Orthopedics;  Laterality: Left;   TOTAL HIP ARTHROPLASTY Right 01/28/2015   Procedure: RIGHT TOTAL HIP ARTHROPLASTY ANTERIOR APPROACH;  Surgeon: MParalee Cancel MD;  [facility];  Service: Orthopedics;  Laterality: Right;   TOTAL KNEE ARTHROPLASTY Left 11/11/2015   Procedure: LEFT TOTAL KNEE ARTHROPLASTY;  Surgeon: MParalee Cancel MD;  [facility];  Service: Orthopedics;  Laterality: Left;   TOTAL SHOULDER ARTHROPLASTY  08/11/2012   Procedure: TOTAL SHOULDER ARTHROPLASTY;  Surgeon: SAugustin Schooling MD;  [facility]  Service: Orthopedics;  Laterality: Right;  RIGHT  TOTAL SHOULDER  ARTHROPLASTY    TRANSCATHETER AORTIC VALVE REPLACEMENT, TRANSFEMORAL N/A 12/14/2016   Procedure: TRANSCATHETER AORTIC VALVE REPLACEMENT, TRANSFEMORAL;  Surgeon: CBurnell Blanks MD;  [facility]  Service: Open Heart Surgery;  Laterality: N/A;   TRANSTHORACIC ECHOCARDIOGRAM  02-25-2014   moderate LVH/  ef 55-60%/  mod. to sev. calcification AV with mild to moderate AV stenosis/  mild to mod. AR and MR/ mild PR/   moderate to severe  LAE   TUBAL LIGATION  1973   Allergies  Allergen Reactions   Beta Adrenergic Blockers Shortness Of Breath   Other     Narcotic pain medications - anxiety attacks   Oxycodone Shortness Of Breath and Other (See Comments)  Tolerates Dilaudid, Fentanyl   Sotalol Hcl Shortness Of Breath and Other (See Comments)   Tramadol Anxiety and Other (See Comments)    Can take if absolutely necessary   Clonidine Derivatives Palpitations    bradycardia   Crestor [Rosuvastatin Calcium] Other (See Comments)    MYALGIAS   Latex Rash    Per patient-bandaids- no problems with gloves   Rosuvastatin Calcium Nausea Only   Rosuvastatin    Tape Rash     bandaids    Social History   Socioeconomic History   Marital status: Married    Spouse name: Pilar Plate   Number of children: 5   Years of education: Not on file   Highest education level: Associate degree: academic program  Occupational History   Not on file  Tobacco Use   Smoking status: Former    Packs/day: 0.25    Years: 8.00    Total pack years: 2.00    Types: Cigarettes    Quit date: 10/18/1966    Years since quitting: 55.6   Smokeless tobacco: Never  Vaping Use   Vaping Use: Never used  Substance and Sexual Activity   Alcohol use: No   Drug use: No   Sexual activity: Yes  Other Topics Concern   Not on file  Social History Narrative   10/08/21 lives with husband   Social Determinants of Health   Financial Resource Strain: Not on file  Food Insecurity: Not on file  Transportation Needs: Not on file  Physical Activity: Not on file  Stress: Not on file  Social Connections: Not on file  Intimate Partner Violence: Not on file   Family History  Problem Relation Age of Onset   Heart disease Mother    Heart disease Father      Vitals BP 125/67   Pulse (!) 53   Temp 97.7 F (36.5 C) (Oral)   Wt 168 lb (76.2 kg)   BMI 25.17 kg/m   Examination  General - not in acute distress, comfortably sitting in chair HEENT - PEERLA,  no pallor and no icterus Chest - b/l clear air entry, no additional sounds CVS- Normal P5K9, systolic murmur+ Abdomen - Soft, Non tender , non distended Ext- no pedal edema Neuro: grossly normal Psych : calm and cooperative   Recent labs    Latest Ref Rng & Units 09/25/2021   10:41 AM 09/01/2021    9:39 AM 08/10/2021    1:09 AM  CBC  WBC 3.4 - 10.8 x10E3/uL 8.7  8.3  11.1   Hemoglobin 11.1 - 15.9 g/dL 9.6  9.5  9.6   Hematocrit 34.0 - 46.6 % 31.6  29.6  29.8   Platelets 150 - 450 x10E3/uL 248  159  209       Latest Ref Rng & Units 11/05/2021    9:54 AM 10/06/2021    9:57 AM 09/25/2021   10:41 AM  CMP  Glucose 65 - 99 mg/dL 89  83  87   BUN 7 - 25 mg/dL '22  19  15   '$ Creatinine 0.60 - 0.95 mg/dL 0.87  0.70  0.89   Sodium 135 - 146 mmol/L 141  141  143   Potassium 3.5 - 5.3 mmol/L 3.3  3.7  4.3   Chloride 98 - 110 mmol/L 103  106  103   CO2 20 - 32 mmol/L '30  24  20   '$ Calcium 8.6 - 10.4 mg/dL 9.7  9.5  10.6   Total Protein 6.1 -  8.1 g/dL  6.8    Total Bilirubin 0.2 - 1.2 mg/dL  0.8    AST 10 - 35 U/L  17    ALT 6 - 29 U/L  14       Pertinent Microbiology Results for orders placed or performed in visit on 09/04/21  SARS Coronavirus 2 (TAT 6-24 hrs)     Status: None   Collection Time: 09/04/21 12:00 AM  Result Value Ref Range Status   SARS Coronavirus 2 RESULT: NEGATIVE  Final    Comment: RESULT: NEGATIVESARS-CoV-2 INTERPRETATION:A NEGATIVE  test result means that SARS-CoV-2 RNA was not present in the specimen above the limit of detection of this test. This does not preclude a possible SARS-CoV-2 infection and should not be used as the  sole basis for patient management decisions. Negative results must be combined with clinical observations, patient history, and epidemiological information. Optimum specimen types and timing for peak viral levels during infections caused by SARS-CoV-2  have not been determined. Collection of multiple specimens or types of specimens may be  necessary to detect virus. Improper specimen collection and handling, sequence variability under primers/probes, or organism present below the limit of detection may  lead to false negative results. Positive and negative predictive values of testing are highly dependent on prevalence. False negative test results are more likely when prevalence of disease is high.The expected result is NEGATIVE.Fact S heet for  Healthcare Providers: LocalChronicle.no Sheet for Patients: SalonLookup.es Reference Range - Negative    Pertinent Imaging TTE 10/22/21 IMPRESSIONS    1. No obvious vegetations detected.   2. Left ventricular ejection fraction by 3D volume is 60 %. The left  ventricle has normal function. The left ventricle has no regional wall  motion abnormalities.   3. Right ventricular systolic function is normal. The right ventricular  size is mildly enlarged. There is mildly elevated pulmonary artery  systolic pressure.   4. Left atrial size was severely dilated.   5. Right atrial size was severely dilated.   6. The mitral valve is normal in structure. Mild mitral valve  regurgitation. No evidence of mitral stenosis.   7. The aortic valve has been repaired/replaced. Aortic valve  regurgitation is not visualized. No aortic stenosis is present. There is a  26 mm Sapien prosthetic (TAVR) valve present in the aortic position. Echo  findings are consistent with normal  structure and function of the aortic valve prosthesis. Aortic valve mean  gradient measures 12.0 mmHg. Aortic valve Vmax measures 2.34 m/s.   8. The inferior vena cava is dilated in size with <50% respiratory  variability, suggesting right atrial pressure of 15 mmHg.   Comparison(s): EF 55%, left and right atrium severly dilated, mild to  moderate MR and TR. Vegetation .8 x .7 cm seen on prosthetiv AOV. Trivial  AI.   I have spent a total of 40  minutes of face-to-face  and non-face-to-face time, including clinical staff time, preparing to see patient, ordering tests and/or medications, and provide counseling the patient.   Electronically signed by:  Rosiland Oz, MD Infectious Disease Physician [facility] [address] [place], [place] [address] Phone: [phone]  Fax: [phone]

## 2022-05-13 ENCOUNTER — Other Ambulatory Visit: Payer: Self-pay | Admitting: Infectious Diseases

## 2022-05-13 ENCOUNTER — Telehealth: Payer: Self-pay

## 2022-05-13 DIAGNOSIS — Z5181 Encounter for therapeutic drug level monitoring: Secondary | ICD-10-CM

## 2022-05-13 DIAGNOSIS — T826XXD Infection and inflammatory reaction due to cardiac valve prosthesis, subsequent encounter: Secondary | ICD-10-CM

## 2022-05-13 LAB — CBC
HCT: 38.9 % (ref 35.0–45.0)
Hemoglobin: 12.7 g/dL (ref 11.7–15.5)
MCH: 32.5 pg (ref 27.0–33.0)
MCHC: 32.6 g/dL (ref 32.0–36.0)
MCV: 99.5 fL (ref 80.0–100.0)
MPV: 9.6 fL (ref 7.5–12.5)
Platelets: 204 10*3/uL (ref 140–400)
RBC: 3.91 10*6/uL (ref 3.80–5.10)
RDW: 14.2 % (ref 11.0–15.0)
WBC: 7 10*3/uL (ref 3.8–10.8)

## 2022-05-13 LAB — COMPREHENSIVE METABOLIC PANEL
AG Ratio: 1.7 (calc) (ref 1.0–2.5)
ALT: 11 U/L (ref 6–29)
AST: 17 U/L (ref 10–35)
Albumin: 4.5 g/dL (ref 3.6–5.1)
Alkaline phosphatase (APISO): 63 U/L (ref 37–153)
BUN/Creatinine Ratio: 36 (calc) — ABNORMAL HIGH (ref 6–22)
BUN: 38 mg/dL — ABNORMAL HIGH (ref 7–25)
CO2: 27 mmol/L (ref 20–32)
Calcium: 10.3 mg/dL (ref 8.6–10.4)
Chloride: 108 mmol/L (ref 98–110)
Creat: 1.06 mg/dL — ABNORMAL HIGH (ref 0.60–0.95)
Globulin: 2.6 g/dL (calc) (ref 1.9–3.7)
Glucose, Bld: 101 mg/dL — ABNORMAL HIGH (ref 65–99)
Potassium: 5 mmol/L (ref 3.5–5.3)
Sodium: 143 mmol/L (ref 135–146)
Total Bilirubin: 0.7 mg/dL (ref 0.2–1.2)
Total Protein: 7.1 g/dL (ref 6.1–8.1)

## 2022-05-13 LAB — SEDIMENTATION RATE: Sed Rate: 9 mm/h (ref 0–30)

## 2022-05-13 LAB — C-REACTIVE PROTEIN: CRP: 2.1 mg/L (ref <8.0)

## 2022-05-13 NOTE — Progress Notes (Signed)
Please let her know Cr is slightly up than prior values.  Please have her come in for lab visit for BMP in 2 weeks

## 2022-05-13 NOTE — Telephone Encounter (Signed)
Spoke with patient, relayed that kidney function was elevated and scheduled for lab appointment 8/10 to recheck.   Beryle Flock, RN

## 2022-05-13 NOTE — Telephone Encounter (Signed)
-----   Message from Rosiland Oz, MD sent at 05/13/2022  7:42 AM EDT ----- Please let her know Cr is slightly up than prior values.  Please have her come in for lab visit for BMP in 2 weeks

## 2022-05-20 DIAGNOSIS — I350 Nonrheumatic aortic (valve) stenosis: Secondary | ICD-10-CM | POA: Diagnosis not present

## 2022-05-20 DIAGNOSIS — I272 Pulmonary hypertension, unspecified: Secondary | ICD-10-CM | POA: Diagnosis not present

## 2022-05-20 DIAGNOSIS — R413 Other amnesia: Secondary | ICD-10-CM | POA: Diagnosis not present

## 2022-05-20 DIAGNOSIS — I10 Essential (primary) hypertension: Secondary | ICD-10-CM | POA: Diagnosis not present

## 2022-05-20 DIAGNOSIS — Z952 Presence of prosthetic heart valve: Secondary | ICD-10-CM | POA: Diagnosis not present

## 2022-05-20 DIAGNOSIS — D6869 Other thrombophilia: Secondary | ICD-10-CM | POA: Diagnosis not present

## 2022-05-20 DIAGNOSIS — I4891 Unspecified atrial fibrillation: Secondary | ICD-10-CM | POA: Diagnosis not present

## 2022-05-22 NOTE — Progress Notes (Signed)
Cardiology Office Note:    Date:  05/24/2022   ID:  [name], DOB 1936/10/09, MRN [id number]  PCP:  Lujean Amel, [place] [facility] Cardiologist:  Fransico Him, MD     Referring MD: Lujean Amel, MD   Here for follow up regarding TAVR, endocarditis, and diastolic heart failure  History of Present Illness:    [name] is a 86 y.o. 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 In 02/2021 she was evaluated for worsening dyspnea. Echo felt to be stable though with worsening pulmonary HTN. VQ scan was negative. PFTs showed moderately severe perfusion defect. She already had known OSA. 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  Cardiac CT showed evidence of large vegetation and LAA thrombus.  It was found that she was not a candidate for SAVR and she would need chronic suppression with long-term antibiotic therapy per ID.  She is closely followed by infectious disease.    She was last seen by Laurann Montana, NP-C on October 21, 2021.  She was found to be tolerating amoxicillin well and denied any shortness of breath at rest.  She was still continuing to have dyspnea on exertion that was improving.  Denied any chest pain, edema, PND, palpitations, and orthopnea.  She was not able to establish with physical therapy; however she said she was walking her stairs at home for physical activity.  Denied any bleeding complications on Xarelto and her lower extremity edema had resolved.  Her follow-up echocardiogram on October 22, 2021 did not show any obvious vegetations.  LVEF was 60%.  Mild elevation in pulmonary artery systolic pressure.  Severe dilation of left atrial size and right atrial size.  Dilation of IVC, suggesting right atrial pressure of 15 mmHg.    Today she presents for follow-up of chronic diastolic CHF and endocarditis.  She states she has been doing well since she was last seen in January of this year.  Husband is present in the room with her and had some questions regarding care in [place] versus care in the [place].  Stated in [place] she did not need to be on antibiotic therapy: However husband disagreed with the doctors in [place] and has been continuing to keep her on amoxicillin.  She has had previous echocardiograms done in [place] but husband is requesting to have another complete echocardiogram done here.  Denies any chest pain, shortness of breath, swelling, PND, orthopnea, bleeding, claudication, or any significant weight changes.  She is compliant with her CPAP every night and denies any issues with this.  Tolerating Xarelto well.  She is being closely followed by infectious disease and is due for blood work on Thursday.  Also requesting to be followed up before they leave for [place] in mid to late October.  Denies any other questions or concerns.  Past Medical History:  Diagnosis Date   Anginal pain ([place])    Aortic stenosis    severe AS with moderate AR by echo with normal LVF   Arthritis  CAD in native artery, non obstructive cath 11/2016.  12/16/2016   Cancer ([place])    left breast mastectomy(cancer)-surgery only   Chronic diastolic congestive heart failure (HCC)    Complication of anesthesia    extremely claustophobic(only occurs with narcotic medications as stated per pt)   Diverticulosis    Dysrhythmia    Afib   Fibromyalgia    GERD (gastroesophageal reflux disease)    Hard of hearing    wears hearing aides    History of breast cancer no recurrence    dx 2004   s/p  left breast mastectomy w/ node dissection/   no chemo or radiation   History of TIA (transient ischemic attack)    per scan--  no residual   Hyperlipidemia    Hypertension    Nocturia    OSA on CPAP     severe/  AHI 34/hr   Permanent atrial fibrillation ([place])    on Xarelto for CHADS2VASC score of 5   Pulmonary HTN ([place])    PASP 40mHg by echo 11/2016   Urge urinary incontinence    Wears glasses     Past Surgical History:  Procedure Laterality Date   ABDOMINOPLASTY  1[place]  BREAST SURGERY Left 2004   mastectomy    CARDIAC CATHETERIZATION N/A 11/19/2016   Procedure: Right/Left Heart Cath and Coronary Angiography;  Surgeon: TTroy Sine MD;  [facility];  Service: Cardiovascular;  Laterality: N/A;   CARPAL TUNNEL RELEASE Right 07-08-2004   COLONOSCOPY WITH ESOPHAGOGASTRODUODENOSCOPY (EGD)  2006   EXPLANTATION BREAST IMPLANT AND DEBRIDEMENT Left 09/15/2005   INTERSTIM IMPLANT PLACEMENT N/A 08/22/2014   Procedure: IBarrie LymeIMPLANT STAGE ONE/TWO;  Surgeon: SReece Packer MD;  [facility]  Service: Urology;  Laterality: N/A;   JOINT REPLACEMENT Left 2017   knee replacement   MASTECTOMY     MASTECTOMY, MODIFIED RADICAL W/RECONSTRUCTION  09-16-2003   left breast W/ SLN DISSECTION (pt states had 13 operations for implant infection    REPLACE TISSUE EXPANDER LEFT BREAST AND TOTAL CAPSULECTOMY  08-05-2006   REVERSE SHOULDER ARTHROPLASTY Left 10/26/2019   Procedure: REVERSE SHOULDER ARTHROPLASTY;  Surgeon: NNetta Cedars MD;  [facility];  Service: Orthopedics;  Laterality: Left;  with interscalene block   REVISION BREAST RECONSTRUCTION Left 11-11-2004   12-03-2004  DRAINAGE SEREMA LEFT CHEST   TEE WITHOUT CARDIOVERSION N/A 12/14/2016   Procedure: TRANSESOPHAGEAL ECHOCARDIOGRAM (TEE);  Surgeon: CBurnell Blanks MD;  [facility]  Service: Open Heart Surgery;  Laterality: N/A;   TEE WITHOUT  CARDIOVERSION N/A 08/10/2021   Procedure: TRANSESOPHAGEAL ECHOCARDIOGRAM (TEE);  Surgeon: CBuford Dresser MD;  [facility];  Service: Cardiovascular;  Laterality: N/A;   TOTAL HIP ARTHROPLASTY Left 07/17/2013   Procedure: LEFT TOTAL HIP ARTHROPLASTY ANTERIOR APPROACH;  Surgeon: MMauri Pole MD;  [facility];  Service: Orthopedics;  Laterality: Left;   TOTAL HIP ARTHROPLASTY Right 01/28/2015   Procedure: RIGHT TOTAL HIP ARTHROPLASTY ANTERIOR APPROACH;  Surgeon: MParalee Cancel MD;  [facility];  Service: Orthopedics;  Laterality: Right;   TOTAL KNEE ARTHROPLASTY Left 11/11/2015   Procedure: LEFT TOTAL KNEE ARTHROPLASTY;  Surgeon: MParalee Cancel MD;  [facility];  Service: Orthopedics;  Laterality: Left;   TOTAL SHOULDER ARTHROPLASTY  08/11/2012   Procedure: TOTAL SHOULDER ARTHROPLASTY;  Surgeon: SAugustin Schooling MD;  [facility]  Service: Orthopedics;  Laterality: Right;  RIGHT  TOTAL SHOULDER  ARTHROPLASTY  TRANSCATHETER AORTIC VALVE REPLACEMENT, TRANSFEMORAL N/A 12/14/2016   Procedure: TRANSCATHETER AORTIC VALVE REPLACEMENT, TRANSFEMORAL;  Surgeon: Burnell Blanks, MD;  Location: [facility];  Service: Open Heart Surgery;  Laterality: N/A;   TRANSTHORACIC ECHOCARDIOGRAM  02-25-2014   moderate LVH/  ef 55-60%/  mod. to sev. calcification AV with mild to moderate AV stenosis/  mild to mod. AR and MR/ mild PR/   moderate to severe LAE   TUBAL LIGATION  1973    Current Medications: Current Meds  Medication Sig   alendronate (FOSAMAX) 70 MG tablet Take 70 mg by mouth every Saturday.   amLODipine (NORVASC) 5 MG tablet Take 1 tablet by mouth daily.   amoxicillin (AMOXIL) 500 MG capsule Take 2 capsules (1,000 mg total) by mouth 3 (three) times daily.   atorvastatin (LIPITOR) 80 MG tablet Take 1 tablet (80 mg total) by mouth daily.   Cholecalciferol (VITAMIN D3) 50 MCG (2000 UT) capsule Take 2,000 Units by mouth daily.   DILT-XR 240 MG 24 hr capsule Take 1  capsule (240 mg total) by mouth every morning.   furosemide (LASIX) 20 MG tablet Take 1 tablet (20 mg total) by mouth daily.   hydrOXYzine (ATARAX/VISTARIL) 25 MG tablet Take 25 mg by mouth every 12 (twelve) hours as needed for anxiety.   losartan (COZAAR) 100 MG tablet Take 1 tablet (100 mg total) by mouth daily.   Melatonin 10 MG TABS Take 10 mg by mouth at bedtime as needed (for sleep).    Nutritional Supplements (VITAMIN D BOOSTER PO) Take 800 Units by mouth at bedtime. Kalcipos-d forte   omeprazole (PRILOSEC) 20 MG capsule Take 20 mg by mouth daily as needed (acid reflux/indigestion.).    potassium chloride SA (KLOR-CON M) 20 MEQ tablet Take 1 tablet (20 mEq total) by mouth daily.   predniSONE (DELTASONE) 5 MG tablet Take 5 mg by mouth daily with breakfast.   rivaroxaban (XARELTO) 20 MG TABS tablet Take 1 tablet (20 mg total) by mouth daily with supper.   triamcinolone ointment (KENALOG) 0.1 % Apply 1 application topically 2 (two) times daily as needed (affected areas of feet).      Allergies:   Beta adrenergic blockers, Other, Oxycodone, Sotalol hcl, Tramadol, Clonidine derivatives, Crestor [rosuvastatin calcium], Latex, Rosuvastatin calcium, Rosuvastatin, and Tape   Social History   Socioeconomic History   Marital status: Married    Spouse name: Pilar Plate   Number of children: 5   Years of education: Not on file   Highest education level: Associate degree: academic program  Occupational History   Not on file  Tobacco Use   Smoking status: Former    Packs/day: 0.25    Years: 8.00    Total pack years: 2.00    Types: Cigarettes    Quit date: 10/18/1966    Years since quitting: 55.6   Smokeless tobacco: Never  Vaping Use   Vaping Use: Never used  Substance and Sexual Activity   Alcohol use: No   Drug use: No   Sexual activity: Yes  Other Topics Concern   Not on file  Social History Narrative   10/08/21 lives with husband   Social Determinants of Health   Financial Resource  Strain: Not on file  Food Insecurity: Not on file  Transportation Needs: Not on file  Physical Activity: Not on file  Stress: Not on file  Social Connections: Not on file     Family History: The patient's family history includes Heart disease in her father and mother.  ROS:   Review of Systems  Constitutional:  Negative for chills, diaphoresis, fever, malaise/fatigue and weight loss.  HENT: Negative.    Eyes: Negative.   Respiratory: Negative.    Cardiovascular: Negative.   Gastrointestinal: Negative.   Genitourinary: Negative.   Musculoskeletal: Negative.   Skin:  Negative for itching and rash.  Neurological: Negative.   Endo/Heme/Allergies:  Negative for environmental allergies and polydipsia. Bruises/bleeds easily.  Psychiatric/Behavioral: Negative.     Please see the history of present illness.    All other systems reviewed and are negative.  EKGs/Labs/Other Studies Reviewed:    The following studies were reviewed today:   EKG:  EKG is ordered today.  The ekg ordered today demonstrates A-fib, 76 bpm, minimal LVH, otherwise no acute change.   2D echo complete on October 22, 2021: 1.  No obvious vegetations detected. 2.  LVEF is 60%.  The left ventricle has normal function.  The left ventricle has no regional wall motion abnormalities. 3.  Right ventricular systolic function is normal.  The right ventricular size is mildly enlarged.  There is mildly elevated pulmonary artery systolic pressure. 4.  Left atrial size was severely dilated. 5.  Right atrial size was severely dilated. 6.  The mitral valve is normal in structure.  Mild mitral valve regurgitation.  No evidence of mitral stenosis. 7.  The aortic valve has been repaired/replaced.  Aortic valve regurgitation is not visualized.  No aortic stenosis is present.  There is a 26 mm Sapien prosthetic (TAVR) valve present in the aortic position.  Echo findings are consistent with normal structure and function of the aortic  valve prosthesis.  Aortic valve mean gradient measures 12.0 mmHg.  Aortic valve Vmax measures 2.34 m/s.  8.  The inferior vena cava is dilated in size with less than 50% respiratory variability, suggesting right atrial pressure of 15 mmHg.   Echo TEE on August 10, 2021: 1.  LVEF is 55 to 60%.  The left ventricle has normal function.  The left ventricle has no regional wall motion abnormalities. 2.  Right ventricular systolic function is normal.  The right ventricular size is normal. 3.  Left atrial size is severely dilated.  No left atrial/left atrial appendage thrombus was detected. 4.  Right atrial size was severely dilated. 5.  The mitral valve is normal in structure.  Mild to moderate mitral valve regurgitation. 6.  Tricuspid valve regurgitation is mild to moderate. 7.  There is a vegetation seen on the prosthetic aortic valve, 0.8 x 0.7 cm, predominantly on the ventricular side of the aortic valve but small filament also seen on aortic side of the valve.  The aortic valve has been repaired/replaced.  Aortic valve regurgitation is trivial.  There is a 26 mm Edwards Sapien prosthetic (TAVR) valve present in the aortic position.  Procedure date: 12/14/2016. 8.  There is a moderate (grade 3) plaque involving the descending aorta.  Coronary CTA on August 09, 2021: 1.  There is a large vegetation of the prosthetic aortic valve without abscess.  Reaching out to primary team. 2.  There is a left atrial thrombus.  Venous Doppler left (DVT) on August 04, 2021: No evidence of DVT seen.   Recent Labs: 08/04/2021: B Natriuretic Peptide 474.6 08/10/2021: Magnesium 1.6 05/12/2022: ALT 11; BUN 38; Creat 1.06; Hemoglobin 12.7; Platelets 204; Potassium 5.0; Sodium 143  Recent Lipid Panel    Component Value Date/Time   CHOL 148 02/02/2021 1113   TRIG 76 02/02/2021 1113   HDL  78 02/02/2021 1113   CHOLHDL 1.9 02/02/2021 1113   LDLCALC 55 02/02/2021 1113     Risk Assessment/Calculations:     CHA2DS2-VASc Score = 7  This indicates a 11.2% annual risk of stroke. The patient's score is based upon: CHF History: 1 HTN History: 1 Diabetes History: 1 Stroke History: 0 Vascular Disease History: 1 Age Score: 2 Gender Score: 1         Physical Exam:    VS:  BP 132/70   Pulse 76   Ht 5' 8.5" (1.74 m)   Wt 171 lb (77.6 kg)   SpO2 99%   BMI 25.62 kg/m     Wt Readings from Last 3 Encounters:  05/24/22 171 lb (77.6 kg)  05/12/22 168 lb (76.2 kg)  11/05/21 174 lb (78.9 kg)     GEN: Well nourished, well developed in no acute distress HEENT: Normal NECK: No JVD; No carotid bruits CARDIAC: Irregular rhythm and regular rate, no murmurs, rubs, gallops; 2+ peripheral radial pulses, strong and equal bilaterally; 1+ posterior tibial pulses, equal bilaterally RESPIRATORY:  Clear and diminished to auscultation without rales, wheezing or rhonchi  ABDOMEN: Soft, non-tender, non-distended, bowel sounds X 4 MUSCULOSKELETAL:  Trace, nonpitting edema along BLE; No deformity  SKIN: Warm and dry, bruising along bilateral arms; redness along BLE, no warmth, shiny skin with minimal hair distribution, looks similar presentation to PAD, healing scab along lateral left lower ankle. NEUROLOGIC:  Alert and oriented x 3 PSYCHIATRIC:  Normal affect   ASSESSMENT:    1. Chronic diastolic congestive heart failure ([place])   2. S/p TAVR (transcatheter aortic valve replacement), bioprosthetic 12/14/16   3. Prosthetic valve endocarditis, subsequent encounter   4. Permanent atrial fibrillation ([place])   5. Current use of long term anticoagulation   6. Essential hypertension, benign   7. Coronary artery disease involving native heart without angina pectoris, unspecified vessel or lesion type   8. Hyperlipidemia LDL goal <70   9. OSA on CPAP    PLAN:    In order of problems listed above:  Chronic diastolic CHF - chronic, stable Last echocardiogram in January 2023 revealed LVEF of 60%.  No  vegetations detected.  The aortic valve prosthesis was functioning very well. Euvolemic and well compensated on exam. Continue current medication regimen. Low sodium diet, fluid restriction <2L, and daily weights encouraged. Educated to contact our office for weight gain of 2 lbs overnight or 5 lbs in one week. Will obtain BMET today.   2. Severe AS s/p TAVR in 1751, complicated by Prosthetic valve endocarditis - chronic, stable Previous echocardiogram in January 2023 did not show any vegetations.  I educated husband that he did the right thing to continue her antibiotic therapy even though doctors from [place] stated that she could come off it. Previously recommended for lifelong antibiotic suppression. Continue current antibiotic prophylaxis.  Husband is requesting repeat 2D complete echo and we will arrange that today.  Continue to follow with infectious disease.   3.  Permanent atrial fibrillation, long-term use of anticoagulation-chronic, stable Twelve-lead EKG reveals A-fib with rate controlled,76 bpm, minimal LVH, no other acute changes noted.  Continue current medication regimen.  We will obtain a BMET today, and depending on what this creatinine shows ,we may have to decrease Xarelto from 20 mg to 15 mg daily, since her most recent creatinine clearance is 48.  We will continue to monitor.  4. HTN - chronic, stable Blood pressure well controlled on exam and patient and husband state  blood pressures well controlled at home. Discussed to monitor BP at home at least 2 hours after medications and sitting for 5-10 minutes. Educated them that if blood pressure gets above 841 or above systolic, to let us know and we can titrate up amlodipine. Continue current medication regimen. Will obtain BMET today as mentioned above.   5. CAD, HLD with LDL goal <70, redness of BLE (concern for PAD) - chronic, stable  Last cath in 2018 revealed mild coronary obstructive disease with 70% focal eccentric ostial  narrowing of the first diagonal branch of the LAD and superior takeoff dominant RCA with mild 30 to 40% very proximal stenosis eccentrically.  Normal ramus intermediate and left circumflex coronary arteries.  Last LDL was 91 in July 2020.  We will obtain a fasting lipid panel at this time, as well as BMET mentioned above. Stable with no anginal symptoms. No indication for ischemic evaluation; however, patient exhibits bilateral redness of lower extremities with shiny skin appearance, diminished pulses +1 bilaterally, and minimal hair distribution, all concerning symptoms for peripheral arterial disease.  We will go ahead and arrange bilateral ABIs and bilateral lower extremity arterial duplex.  Continue current medication regimen at this time  6. Disposition - follow up in 2 months per patient request or sooner if needed. Would recommend visit with MD for f/u of OSA as well.    Medication Adjustments/Labs and Tests Ordered: Current medicines are reviewed at length with the patient today.  Concerns regarding medicines are outlined above.  Orders Placed This Encounter  Procedures   Basic Metabolic Panel (BMET)   Lipid panel   EKG 12-Lead   ECHOCARDIOGRAM COMPLETE   VAS [place] ABI WITH/WO TBI   VAS [place] LOWER EXTREMITY ARTERIAL DUPLEX   No orders of the defined types were placed in this encounter.   Patient Instructions  Medication Instructions:  Your physician recommends that you continue on your current medications as directed. Please refer to the Current Medication list given to you today. *If you need a refill on your cardiac medications before your next appointment, please call your pharmacy*   Lab Work: TODAY-BMET, LIPIDS If you have labs (blood work) drawn today and your tests are completely normal, you will receive your results only by: [place] (if you have MyChart) OR A paper copy in the mail If you have any lab test that is abnormal or we need to change your treatment, we will  call you to review the results.   Testing/Procedures: Your physician has requested that you have an echocardiogram. Echocardiography is a painless test that uses sound waves to create images of your heart. It provides your doctor with information about the size and shape of your heart and how well your heart's chambers and valves are working. This procedure takes approximately one hour. There are no restrictions for this procedure.  Your physician has requested that you have an ankle brachial index (ABI). During this test an ultrasound and blood pressure cuff are used to evaluate the arteries that supply the arms and legs with blood. Allow thirty minutes for this exam. There are no restrictions or special instructions.  Your physician has requested that you have a lower extremity arterial exercise duplex. During this test, exercise and ultrasound are used to evaluate arterial blood flow in the legs. Allow one hour for this exam. There are no restrictions or special instructions.   Follow-Up: At [facility], you and your health needs are our priority.  As part of our  continuing mission to provide you with exceptional heart care, we have created designated Provider Care Teams.  These Care Teams include your primary Cardiologist (physician) and Advanced Practice Providers (APPs -  Physician Assistants and Nurse Practitioners) who all work together to provide you with the care you need, when you need it.  We recommend signing up for the patient portal called "MyChart".  Sign up information is provided on this After Visit Summary.  MyChart is used to connect with patients for Virtual Visits (Telemedicine).  Patients are able to view lab/test results, encounter notes, upcoming appointments, etc.  Non-urgent messages can be sent to your provider as well.   To learn more about what you can do with MyChart, go to [email or web address].    Your next appointment:   2 month(s)  The format for your next  appointment:   In Person  Provider:   Fransico Him, MD     Other Instructions   Important Information About Sugar         Signed, Finis Bud, NP  05/24/2022 11:58 AM    [place]

## 2022-05-24 ENCOUNTER — Ambulatory Visit (INDEPENDENT_AMBULATORY_CARE_PROVIDER_SITE_OTHER): Payer: Medicare Other | Admitting: Nurse Practitioner

## 2022-05-24 ENCOUNTER — Encounter: Payer: Self-pay | Admitting: Physician Assistant

## 2022-05-24 VITALS — BP 132/70 | HR 76 | Ht 68.5 in | Wt 171.0 lb

## 2022-05-24 DIAGNOSIS — I5032 Chronic diastolic (congestive) heart failure: Secondary | ICD-10-CM | POA: Diagnosis not present

## 2022-05-24 DIAGNOSIS — I2510 Atherosclerotic heart disease of native coronary artery without angina pectoris: Secondary | ICD-10-CM | POA: Diagnosis not present

## 2022-05-24 DIAGNOSIS — G4733 Obstructive sleep apnea (adult) (pediatric): Secondary | ICD-10-CM | POA: Diagnosis not present

## 2022-05-24 DIAGNOSIS — Z7901 Long term (current) use of anticoagulants: Secondary | ICD-10-CM | POA: Diagnosis not present

## 2022-05-24 DIAGNOSIS — I4821 Permanent atrial fibrillation: Secondary | ICD-10-CM

## 2022-05-24 DIAGNOSIS — I10 Essential (primary) hypertension: Secondary | ICD-10-CM

## 2022-05-24 DIAGNOSIS — L539 Erythematous condition, unspecified: Secondary | ICD-10-CM

## 2022-05-24 DIAGNOSIS — T826XXD Infection and inflammatory reaction due to cardiac valve prosthesis, subsequent encounter: Secondary | ICD-10-CM

## 2022-05-24 DIAGNOSIS — E785 Hyperlipidemia, unspecified: Secondary | ICD-10-CM

## 2022-05-24 DIAGNOSIS — I38 Endocarditis, valve unspecified: Secondary | ICD-10-CM | POA: Diagnosis not present

## 2022-05-24 DIAGNOSIS — Z9989 Dependence on other enabling machines and devices: Secondary | ICD-10-CM | POA: Diagnosis not present

## 2022-05-24 DIAGNOSIS — Z953 Presence of xenogenic heart valve: Secondary | ICD-10-CM | POA: Diagnosis not present

## 2022-05-24 NOTE — Patient Instructions (Addendum)
Medication Instructions:  Your physician recommends that you continue on your current medications as directed. Please refer to the Current Medication list given to you today. *If you need a refill on your cardiac medications before your next appointment, please call your pharmacy*   Lab Work: TODAY-BMET, LIPIDS If you have labs (blood work) drawn today and your tests are completely normal, you will receive your results only by: Drain (if you have MyChart) OR A paper copy in the mail If you have any lab test that is abnormal or we need to change your treatment, we will call you to review the results.   Testing/Procedures: Your physician has requested that you have an echocardiogram. Echocardiography is a painless test that uses sound waves to create images of your heart. It provides your doctor with information about the size and shape of your heart and how well your heart's chambers and valves are working. This procedure takes approximately one hour. There are no restrictions for this procedure.  Your physician has requested that you have an ankle brachial index (ABI). During this test an ultrasound and blood pressure cuff are used to evaluate the arteries that supply the arms and legs with blood. Allow thirty minutes for this exam. There are no restrictions or special instructions.  Your physician has requested that you have a lower extremity arterial exercise duplex. During this test, exercise and ultrasound are used to evaluate arterial blood flow in the legs. Allow one hour for this exam. There are no restrictions or special instructions.   Follow-Up: At [facility], you and your health needs are our priority.  As part of our continuing mission to provide you with exceptional heart care, we have created designated Provider Care Teams.  These Care Teams include your primary Cardiologist (physician) and Advanced Practice Providers (APPs -  Physician Assistants and Nurse  Practitioners) who all work together to provide you with the care you need, when you need it.  We recommend signing up for the patient portal called "MyChart".  Sign up information is provided on this After Visit Summary.  MyChart is used to connect with patients for Virtual Visits (Telemedicine).  Patients are able to view lab/test results, encounter notes, upcoming appointments, etc.  Non-urgent messages can be sent to your provider as well.   To learn more about what you can do with MyChart, go to [email or web address].    Your next appointment:   2 month(s)  The format for your next appointment:   In Person  Provider:   Fransico Him, MD     Other Instructions   Important Information About Sugar

## 2022-05-25 DIAGNOSIS — I5032 Chronic diastolic (congestive) heart failure: Secondary | ICD-10-CM

## 2022-05-25 LAB — BASIC METABOLIC PANEL
BUN/Creatinine Ratio: 25 (ref 12–28)
BUN: 25 mg/dL (ref 8–27)
CO2: 22 mmol/L (ref 20–29)
Calcium: 10.6 mg/dL — ABNORMAL HIGH (ref 8.7–10.3)
Chloride: 105 mmol/L (ref 96–106)
Creatinine, Ser: 0.99 mg/dL (ref 0.57–1.00)
Glucose: 110 mg/dL — ABNORMAL HIGH (ref 70–99)
Potassium: 5.4 mmol/L — ABNORMAL HIGH (ref 3.5–5.2)
Sodium: 144 mmol/L (ref 134–144)
eGFR: 56 mL/min/{1.73_m2} — ABNORMAL LOW (ref >59)

## 2022-05-25 LAB — LIPID PANEL
Chol/HDL Ratio: 1.7 ratio (ref 0.0–4.4)
Cholesterol, Total: 164 mg/dL (ref 100–199)
HDL: 99 mg/dL (ref >39)
LDL Chol Calc (NIH): 54 mg/dL (ref 0–99)
Triglycerides: 52 mg/dL (ref 0–149)
VLDL Cholesterol Cal: 11 mg/dL (ref 5–40)

## 2022-05-25 NOTE — Telephone Encounter (Signed)
The patient's husband has been notified of the result and verbalized understanding.  All questions (if any) were answered. Antonieta Iba, RN 05/25/2022 12:33 PM   Patient will resume xarelto 20 mg daily and will hold losartan for today. Husband is not sure if she is taking a potassium supplement. He is going to double check and they will discontinue if it is on her list. She will come in on Thursday for labs.

## 2022-05-25 NOTE — Telephone Encounter (Signed)
-----   Message from Finis Bud, NP sent at 05/25/2022  8:57 AM EDT ----- Creatinine is back down to normal. We will resume 20 mg of Xarelto daily.   K+ is up to 5.4. We will d/c her potassium tablet and hold her losartan tablet for one day. I tried reaching her this morning but it went to voicemail and told her to call us back before she takes her AM medications because we are adjusting her medications. I also sent her a MyChart message relaying this information. Please try to reach out to patient again to update her on this information.   Calcium is mildly elevated, not of concern at this time.   Let's repeat a BMET for this Thursday, 8/10.  Thanks!   Kind Regards,  Finis Bud, AGNP-C

## 2022-05-26 ENCOUNTER — Other Ambulatory Visit: Payer: Self-pay | Admitting: Nurse Practitioner

## 2022-05-26 ENCOUNTER — Telehealth: Payer: Self-pay | Admitting: Nurse Practitioner

## 2022-05-26 DIAGNOSIS — E663 Overweight: Secondary | ICD-10-CM | POA: Diagnosis not present

## 2022-05-26 DIAGNOSIS — M154 Erosive (osteo)arthritis: Secondary | ICD-10-CM | POA: Diagnosis not present

## 2022-05-26 DIAGNOSIS — M810 Age-related osteoporosis without current pathological fracture: Secondary | ICD-10-CM | POA: Diagnosis not present

## 2022-05-26 DIAGNOSIS — Z6826 Body mass index (BMI) 26.0-26.9, adult: Secondary | ICD-10-CM | POA: Diagnosis not present

## 2022-05-26 DIAGNOSIS — Z7952 Long term (current) use of systemic steroids: Secondary | ICD-10-CM | POA: Diagnosis not present

## 2022-05-26 DIAGNOSIS — M1991 Primary osteoarthritis, unspecified site: Secondary | ICD-10-CM | POA: Diagnosis not present

## 2022-05-26 DIAGNOSIS — M064 Inflammatory polyarthropathy: Secondary | ICD-10-CM | POA: Diagnosis not present

## 2022-05-26 NOTE — Telephone Encounter (Addendum)
Received MyChart message from patient's husband for Rx change from Losartan with potassium to regular Losartan 100 mg daily to patient's pharmacy. I sent in a 3 month supply for generic Losartan 100 mg daily (without potassium) to patient's pharmacy and updated patient's husband (listed on patient's DPR) and he verbalized understanding. I mentioned how she will be coming in tomorrow for repeat BMET to evaluate potassium level and he verbalized understanding.   Finis Bud, NP-C

## 2022-05-27 ENCOUNTER — Telehealth: Payer: Self-pay

## 2022-05-27 ENCOUNTER — Other Ambulatory Visit: Payer: Self-pay

## 2022-05-27 ENCOUNTER — Other Ambulatory Visit: Payer: Medicare Other

## 2022-05-27 ENCOUNTER — Telehealth: Payer: Self-pay | Admitting: Nurse Practitioner

## 2022-05-27 DIAGNOSIS — Z5181 Encounter for therapeutic drug level monitoring: Secondary | ICD-10-CM

## 2022-05-27 DIAGNOSIS — I5032 Chronic diastolic (congestive) heart failure: Secondary | ICD-10-CM

## 2022-05-27 NOTE — Telephone Encounter (Signed)
Consulted Clinical Pharmacist regarding patient's husband's concern regarding Losartan and Karren Cobble stated it does not have potassium in it. She is okay to resume 100 mg of Losartan daily. I s/w patient's husband (listed on DPR) who verbalized understanding. Her BMET lab work drawn today is pending. Will continue to monitor.   Finis Bud, NP

## 2022-05-27 NOTE — Telephone Encounter (Signed)
Pt arrived at [facility] on [address] to have a BMET drawn per Finis Bud, NP order.   Lab contacted [facility] after initiating BMET order by NP Arlington Calix;  Lab team saw that PT had BMET drawn this morning, 05/27/22 at 9:16 am, ordered by Rosiland Oz, MD.  Since the same lab was drawn this morning at another office, but BMET result will be visible in Epic, Lab team told not to draw BMET / second order.   Staff message will be sent to E. Arlington Calix, NP to make her aware of this, and the BMET should be visible to her shortly through Epic lab result.

## 2022-05-28 LAB — BASIC METABOLIC PANEL
BUN/Creatinine Ratio: 29 (calc) — ABNORMAL HIGH (ref 6–22)
BUN: 30 mg/dL — ABNORMAL HIGH (ref 7–25)
CO2: 25 mmol/L (ref 20–32)
Calcium: 9.6 mg/dL (ref 8.6–10.4)
Chloride: 105 mmol/L (ref 98–110)
Creat: 1.05 mg/dL — ABNORMAL HIGH (ref 0.60–0.95)
Glucose, Bld: 99 mg/dL (ref 65–99)
Potassium: 3.8 mmol/L (ref 3.5–5.3)
Sodium: 141 mmol/L (ref 135–146)

## 2022-06-01 ENCOUNTER — Other Ambulatory Visit: Payer: Self-pay | Admitting: Nurse Practitioner

## 2022-06-01 DIAGNOSIS — R0989 Other specified symptoms and signs involving the circulatory and respiratory systems: Secondary | ICD-10-CM

## 2022-06-04 ENCOUNTER — Ambulatory Visit (HOSPITAL_COMMUNITY)
Admission: RE | Admit: 2022-06-04 | Discharge: 2022-06-04 | Disposition: A | Discharge status: Home / Self Care | Payer: Medicare Other | Source: Ambulatory Visit | Attending: Cardiovascular Disease | Admitting: Cardiovascular Disease

## 2022-06-04 DIAGNOSIS — R0989 Other specified symptoms and signs involving the circulatory and respiratory systems: Secondary | ICD-10-CM | POA: Diagnosis not present

## 2022-06-08 DIAGNOSIS — R413 Other amnesia: Secondary | ICD-10-CM | POA: Diagnosis not present

## 2022-06-09 ENCOUNTER — Ambulatory Visit (HOSPITAL_COMMUNITY)
Admission: RE | Admit: 2022-06-09 | Discharge: 2022-06-09 | Disposition: A | Discharge status: Home / Self Care | Payer: Medicare Other | Source: Ambulatory Visit | Attending: Cardiovascular Disease | Admitting: Cardiovascular Disease

## 2022-06-09 DIAGNOSIS — Z7901 Long term (current) use of anticoagulants: Secondary | ICD-10-CM | POA: Insufficient documentation

## 2022-06-09 DIAGNOSIS — E785 Hyperlipidemia, unspecified: Secondary | ICD-10-CM | POA: Insufficient documentation

## 2022-06-09 DIAGNOSIS — I5032 Chronic diastolic (congestive) heart failure: Secondary | ICD-10-CM | POA: Diagnosis not present

## 2022-06-09 DIAGNOSIS — I4821 Permanent atrial fibrillation: Secondary | ICD-10-CM | POA: Diagnosis not present

## 2022-06-09 DIAGNOSIS — I071 Rheumatic tricuspid insufficiency: Secondary | ICD-10-CM | POA: Insufficient documentation

## 2022-06-09 DIAGNOSIS — I2510 Atherosclerotic heart disease of native coronary artery without angina pectoris: Secondary | ICD-10-CM | POA: Insufficient documentation

## 2022-06-09 DIAGNOSIS — I350 Nonrheumatic aortic (valve) stenosis: Secondary | ICD-10-CM | POA: Diagnosis not present

## 2022-06-09 DIAGNOSIS — I38 Endocarditis, valve unspecified: Secondary | ICD-10-CM

## 2022-06-09 DIAGNOSIS — I10 Essential (primary) hypertension: Secondary | ICD-10-CM | POA: Diagnosis not present

## 2022-06-09 DIAGNOSIS — G4733 Obstructive sleep apnea (adult) (pediatric): Secondary | ICD-10-CM | POA: Insufficient documentation

## 2022-06-09 DIAGNOSIS — Z953 Presence of xenogenic heart valve: Secondary | ICD-10-CM | POA: Diagnosis not present

## 2022-06-09 LAB — ECHOCARDIOGRAM COMPLETE
AR max vel: 1.41 cm2
AV Area VTI: 1.58 cm2
AV Area mean vel: 1.41 cm2
AV Mean grad: 7.5 mmHg
AV Peak grad: 15.8 mmHg
Ao pk vel: 1.99 m/s
Calc EF: 58.6 %
MV M vel: 5.31 m/s
MV Peak grad: 112.8 mmHg
S' Lateral: 2.5 cm
Single Plane A2C EF: 62 %
Single Plane A4C EF: 55.7 %

## 2022-06-14 ENCOUNTER — Telehealth: Payer: Self-pay

## 2022-06-14 ENCOUNTER — Other Ambulatory Visit: Payer: Self-pay

## 2022-06-14 ENCOUNTER — Encounter: Payer: Self-pay | Admitting: Infectious Diseases

## 2022-06-14 DIAGNOSIS — Z8679 Personal history of other diseases of the circulatory system: Secondary | ICD-10-CM

## 2022-06-14 DIAGNOSIS — I350 Nonrheumatic aortic (valve) stenosis: Secondary | ICD-10-CM

## 2022-06-14 DIAGNOSIS — Z953 Presence of xenogenic heart valve: Secondary | ICD-10-CM

## 2022-06-14 NOTE — Telephone Encounter (Signed)
Will need Cardiac CTA with "TAVR protocol" in the comments. Recommend Ivabradine 10 mg 2 hours prior to CTA - please send to [place] for better pricing. Please provide pt instructions with .ctchmg   Cancel Cardiac MRI order.   Finis Bud, NP

## 2022-06-14 NOTE — Telephone Encounter (Deleted)
ct 

## 2022-06-14 NOTE — Telephone Encounter (Signed)
Cardiac CTA ordered with comment 'TAVR protocol' per Finis Bud, NP and Dr. Theodosia Blender discussion. Given goal HR <70bpm during study and intolerance to beta blocker - Rx Ivabradine '10mg'$  (take two '5mg'$  tablets) two hours prior to cardiac CT. [name] to be mailed as this is more cost effective than her local pharmacy.   Cardiac CTA instructions sent via MyChart.   Jacqueline Dubonnet, NP

## 2022-06-14 NOTE — Addendum Note (Signed)
Addended by: Ulice Brilliant T on: 06/14/2022 03:05 PM   Modules accepted: Orders

## 2022-06-14 NOTE — Telephone Encounter (Signed)
Sueanne Margarita, MD  You; Finis Bud, NP 29 minutes ago (11:09 AM)    Cardiac morphology TAVR    Morton Peters, NP 17 minutes ago (10:42 AM)    Ok  Is there any special instructions I need to add since it says TAVR protocol?  I just want to be safe and make sure Im ordering the correct test?      Finis Bud, NP  You 1 hour ago (9:51 AM)    Thanks for the clarification Darden Dates!   Dr. Radford Pax stated it will need to be the cardiac morphology TAVR protocol. Once this is ordered, do you mind updating her husband so he is aware?   Thanks! You're awesome!   Kind Regards,  Finis Bud, NP     Dennis Bast  Finis Bud, NP; Sueanne Margarita, MD 1 hour ago (9:48 AM)    Marykay Lex  I just want to clarify what test I am supposed to order the non contrast CT of the chest or the cardiac morphology TAVR protocol?   Thanks,  [place], CMA     Finis Bud, NP  You; Sueanne Margarita, MD 1 hour ago (9:06 AM)    Patient and husband wrote a MyChart message requesting CT scan to be done.  I ran this by Dr. Radford Pax, who agreed to go ahead and arrange noncontrast CT of the chest, patient and husband want to check on vegetation of valve.    Diagnoses this can be put under:  Severe AS  s/p TAVR  Hx of endocarditis   She has a follow-up with Dr. Radford Pax on October 11.  They are requesting to have this done before this appointment.  Can we please have this arranged prior to October 11?  They will be heading back to [place] in early November.   Thanks so much!   Kind Regards,  Finis Bud, NP    Sueanne Margarita, MD  Finis Bud, NP 2 hours ago (8:12 AM)    Typically we do not do a followup as endocarditis is based on positive blood cx.  I am fine with getting the CT - it will need to be a cardiac morphology TAVR protocol    Finis Bud, NP  Sueanne Margarita, MD 3 hours ago (7:54 AM)    This patient and husband are requesting to do a CT scan to check on vegetation. Do you  want to consider a non-contrast CT of the chest? Wanted to update you about patient's concern and this request before they go back to [place] this fall. Please advise.   Thanks so much!   Kind Regards,  Finis Bud, NP    Sung Amabile, CMA routed conversation to Finis Bud, NP 3 hours ago (7:41 AM)   Pixie Casino, NP 3 hours ago (7:31 AM)    I think since the vegetation was so big that it could be seen on CT, this might be what they are referring to. Usually TEE is the gold standard to re-evaluate for abscess. I would recommend you touch base with the patient's primary cardiologist to review their concerns and how to surveil. Thanks!    Finis Bud, NP  Dunn, Nedra Hai, PA-C 17 hours ago (5:20 PM)    When I saw them, they mentioned about repeating the test, but didn't mention CT scan. I think they are referring to the coronary CTA that showed this on 10/25. Would you  do regular CT scan of chest without contrast? TEE would probably be better to see vegetation, but I think they are just wanting peace of mind to look at the valve and check for vegetation before going back to [place].   Thanks!   Kind Regards,  Benjamine Mola

## 2022-06-14 NOTE — Addendum Note (Signed)
Addended by: Loel Dubonnet on: 06/14/2022 03:26 PM   Modules accepted: Orders

## 2022-06-14 NOTE — Telephone Encounter (Addendum)
I spoke with the patients spouse and made the spouse aware that I have placed the order and sent a message to precert.   Spouse states he would like for the test to be completed by 07/28/22 so he can review the results before they leave the country. Due to him not having no access to [facility] out of the [place]. Also told spouse to have his wife come in next week for labs to complete prior to procedure. He voiced understanding.

## 2022-06-15 DIAGNOSIS — I872 Venous insufficiency (chronic) (peripheral): Secondary | ICD-10-CM | POA: Diagnosis not present

## 2022-06-15 DIAGNOSIS — I8311 Varicose veins of right lower extremity with inflammation: Secondary | ICD-10-CM | POA: Diagnosis not present

## 2022-06-15 DIAGNOSIS — D225 Melanocytic nevi of trunk: Secondary | ICD-10-CM | POA: Diagnosis not present

## 2022-06-15 DIAGNOSIS — Z85828 Personal history of other malignant neoplasm of skin: Secondary | ICD-10-CM | POA: Diagnosis not present

## 2022-06-15 DIAGNOSIS — L821 Other seborrheic keratosis: Secondary | ICD-10-CM | POA: Diagnosis not present

## 2022-06-15 DIAGNOSIS — L570 Actinic keratosis: Secondary | ICD-10-CM | POA: Diagnosis not present

## 2022-06-15 DIAGNOSIS — C44719 Basal cell carcinoma of skin of left lower limb, including hip: Secondary | ICD-10-CM | POA: Diagnosis not present

## 2022-06-15 DIAGNOSIS — L820 Inflamed seborrheic keratosis: Secondary | ICD-10-CM | POA: Diagnosis not present

## 2022-06-15 DIAGNOSIS — I788 Other diseases of capillaries: Secondary | ICD-10-CM | POA: Diagnosis not present

## 2022-06-15 DIAGNOSIS — I8312 Varicose veins of left lower extremity with inflammation: Secondary | ICD-10-CM | POA: Diagnosis not present

## 2022-06-17 DIAGNOSIS — R413 Other amnesia: Secondary | ICD-10-CM | POA: Diagnosis not present

## 2022-06-19 ENCOUNTER — Encounter: Payer: Self-pay | Admitting: Psychiatry

## 2022-06-23 ENCOUNTER — Telehealth: Payer: Self-pay | Admitting: *Deleted

## 2022-06-23 ENCOUNTER — Ambulatory Visit: Payer: Medicare Other | Attending: Cardiology

## 2022-06-23 DIAGNOSIS — Z8679 Personal history of other diseases of the circulatory system: Secondary | ICD-10-CM | POA: Diagnosis not present

## 2022-06-23 DIAGNOSIS — Z953 Presence of xenogenic heart valve: Secondary | ICD-10-CM | POA: Insufficient documentation

## 2022-06-23 DIAGNOSIS — I350 Nonrheumatic aortic (valve) stenosis: Secondary | ICD-10-CM | POA: Insufficient documentation

## 2022-06-23 MED ORDER — DONEPEZIL HCL 5 MG PO TABS: 5.0000 mg | ORAL_TABLET | Freq: Every day | ORAL | 6 refills | Status: DC

## 2022-06-23 NOTE — Telephone Encounter (Signed)
Called husband and reviewed Dr Georgina Peer note with him. He asked that Rx be sent today and stated they are leaving the country on 08/18/22 and will be gone a year. I scheduled her to be seen tomorrow, he understands to arrive 30 minutes early. He verbalized understanding, appreciation.

## 2022-06-23 NOTE — Telephone Encounter (Signed)
Received neuropsych evaluation form Dr Cecelia Byars. Placed on MD desk for review.

## 2022-06-23 NOTE — Telephone Encounter (Signed)
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 I think she would benefit from starting donepezil for her memory . I can send a prescription to her pharmacy if she wants to start this now, otherwise we can wait until her next appointment. We can get her on my wait list or set her up for a follow up with an NP if she wants to be seen sooner.

## 2022-06-23 NOTE — Addendum Note (Signed)
Addended by: Genia Harold on: 06/23/2022 02:17 PM   Modules accepted: Orders

## 2022-06-24 ENCOUNTER — Ambulatory Visit (INDEPENDENT_AMBULATORY_CARE_PROVIDER_SITE_OTHER): Payer: Medicare Other | Admitting: Psychiatry

## 2022-06-24 ENCOUNTER — Telehealth: Payer: Self-pay

## 2022-06-24 VITALS — BP 131/67 | HR 66 | Wt 188.1 lb

## 2022-06-24 DIAGNOSIS — F0280 Dementia in other diseases classified elsewhere without behavioral disturbance: Secondary | ICD-10-CM | POA: Diagnosis not present

## 2022-06-24 DIAGNOSIS — G309 Alzheimer's disease, unspecified: Secondary | ICD-10-CM

## 2022-06-24 DIAGNOSIS — F01B0 Vascular dementia, moderate, without behavioral disturbance, psychotic disturbance, mood disturbance, and anxiety: Secondary | ICD-10-CM

## 2022-06-24 LAB — BASIC METABOLIC PANEL
BUN/Creatinine Ratio: 23 (ref 12–28)
BUN: 25 mg/dL (ref 8–27)
CO2: 19 mmol/L — ABNORMAL LOW (ref 20–29)
Calcium: 9.7 mg/dL (ref 8.7–10.3)
Chloride: 108 mmol/L — ABNORMAL HIGH (ref 96–106)
Creatinine, Ser: 1.11 mg/dL — ABNORMAL HIGH (ref 0.57–1.00)
Glucose: 112 mg/dL — ABNORMAL HIGH (ref 70–99)
Potassium: 4.8 mmol/L (ref 3.5–5.2)
Sodium: 145 mmol/L — ABNORMAL HIGH (ref 134–144)
eGFR: 49 mL/min/{1.73_m2} — ABNORMAL LOW (ref >59)

## 2022-06-24 LAB — CBC
Hematocrit: 34.8 % (ref 34.0–46.6)
Hemoglobin: 11.4 g/dL (ref 11.1–15.9)
MCH: 30.7 pg (ref 26.6–33.0)
MCHC: 32.8 g/dL (ref 31.5–35.7)
MCV: 94 fL (ref 79–97)
Platelets: 189 10*3/uL (ref 150–450)
RBC: 3.71 x10E6/uL — ABNORMAL LOW (ref 3.77–5.28)
RDW: 12.3 % (ref 11.7–15.4)
WBC: 7.3 10*3/uL (ref 3.4–10.8)

## 2022-06-24 MED ORDER — APIXABAN 5 MG PO TABS: 5.0000 mg | ORAL_TABLET | Freq: Two times a day (BID) | ORAL | 3 refills | Status: AC

## 2022-06-24 NOTE — Telephone Encounter (Signed)
The patient's husband has been notified of the result and verbalized understanding.  All questions (if any) were answered. Antonieta Iba, RN 06/24/2022 4:46 PM  Prescription has been sent in. PCP has been notified.

## 2022-06-24 NOTE — Patient Instructions (Addendum)
DEMENTIA OVERVIEW "Dementia" is a general term for when a person has developed difficulties with reasoning, judgment, and memory. People who have dementia usually have some memory loss as well as difficulty in at least one other area, such as: ?Speaking or writing coherently (or understanding what is said or written) ?Recognizing familiar surroundings ?Planning and carrying out complex or multi-step tasks In order to be considered dementia these issues must be severe enough to interfere with a person's independence and daily activities. Dementia can be caused by several diseases that affect the brain. The most common cause is Alzheimer disease. Alzheimer disease is present in approximately 60 to 80 percent of all cases of dementia; other degenerative and/or vascular diseases may be present as well, particularly as a person gets older.   DEMENTIA RISK FACTORS There is no way to predict with certainty who will develop dementia. Each form of dementia has its own risk factors, but most forms have several risk factors in common. Age -- The biggest risk factor for dementia is age: dementia is rare in people younger than 60 years and becomes very common in people older than 80. For example, dementia affects approximately one in six people between 80 and 85 years old, one in three above 85 years, and almost half of people over age 90.  Family history -- Some forms of dementia have a genetic component, meaning that they tend to run in families. Having a close family member with Alzheimer disease increases your chances of developing it. People with a first-degree relative (parent or sibling) with Alzheimer disease have a greater chance of developing the disorder. The risk is probably highest if the family member developed Alzheimer disease at a younger age (less than 70 years old) and is lower if the family member did not get Alzheimer disease until late in life. However, families that have a very strong genetic  tendency toward Alzheimer disease are uncommon.  Other factors -- Studies indicate that high blood pressure, smoking, and diabetes may be risk factors for dementia. Experts are still not sure how treatment for these problems might influence your risk of developing dementia beyond their benefit of reducing stroke risk. Lifestyle factors have also been implicated in dementia. For instance, people who remain physically active, socially connected, and mentally engaged seem less likely to develop dementia than people who do not. These activities may produce more cognitive (mental) reserve or resilience, delaying the emergence of symptoms until an older age.  DEMENTIA SYMPTOMS Each form of dementia can cause difficulty with memory, language, reasoning, and judgment, but the symptoms are often very different from person to person. Symptoms also change over time. The differences between one form of dementia and another may only be recognizable to skilled health care providers who have experience working with people with dementia. Sometimes family members notice changes but mistakenly attribute them to aging.  Is memory loss normal? -- Many people worry that memory problems are related to early Alzheimer disease. However, some problems are normal and just related to aging, and do not signify a progressive dementia. Normal age-related changes often cause minor difficulties with immediate memory, for example, remembering a phone number or a set of directions for a short time. Temporary difficulty recalling proper names, even very familiar ones, is also common with aging. As people age normally, it is common to complain of less efficient and slower processing and learning of new information. Memory changes due to normal aging are usually mild and do not worsen greatly over   time, nor should they interfere with a person's day-to-day functioning.  Early changes -- The earliest symptoms of Alzheimer disease are gradual  and often subtle. Many people and their families first notice difficulty recalling recent events or information. This often emerges as a tendency to repeat stories or questions or to request or require repetition of material to be able to remember. If you find yourself telling an older family member or friend "I told you that earlier" or "You have told me that more than once," you might begin to suspect Alzheimer disease. Other changes can include one or more of the following: ?Difficulties with language (eg, not being able to find the right words for things) ?Difficulty with concentration and reasoning ?Problems with complex tasks like paying bills, cooking, or balancing a checkbook ?Getting lost in a familiar place  Late changes -- As Alzheimer disease progresses, a person's ability to think clearly continues to decline, and any or all of the changes listed above may be more disruptive. In addition, personality and behavioral symptoms can become quite troublesome. These can include: ?Increased anger or hostility, sometimes aggressive behavior; alternatively, some people become depressed or exhibit little interest in their surroundings (called "apathy") ?Sleep problems ?Hallucinations and/or delusions ?Disorientation ?Needing help with basic tasks (such as eating, bathing, and dressing) ?Incontinence (difficulty controlling the bladder and/or bowels)  The number of symptoms, the functions that are impaired, and the speed with which symptoms progress can vary widely from one person to the next. In some people, severe dementia occurs within five years of the diagnosis; for others, the progression can take more than 10 years. Most people with Alzheimer disease do not die from the disease itself, but rather from a secondary illness such as pneumonia, bladder infection, or complications of a fall.  DEMENTIA DIAGNOSIS To diagnose dementia and identify the type of dementia, health care providers typically  rely on the information they can gather by interacting with the person and speaking with their family members. The provider will typically perform memory and other cognitive (thinking) tests to assess the person's degree of difficulty with different types of problems. The results of these tests can then be monitored over time to observe whether functioning stays the same or declines.  Blood tests are usually done to find out if a chemical or hormonal imbalance or vitamin deficiency is contributing to the person's difficulties. Brain scans (usually MRI) are often performed in people with dementia to look for other problems. Sometimes the MRI can also help health care providers identify the type of dementia, since different types can have characteristic brain changes.  SAFETY AND LIFESTYLE ISSUES FOR PEOPLE WITH DEMENTIA A major issue for caregivers is making sure the person with dementia stays safe. Because many people with dementia do not realize that their mental functioning is impaired, they try to continue their day-to-day activities as usual. This can lead to physical danger, and caregivers must help to avoid situations that can threaten the safety of the person or others. The following information applies specifically to people with Alzheimer disease, but much of it is also relevant to people with other forms of dementia.  Medications -- People with Alzheimer disease often have trouble remembering to take medications they are prescribed for other conditions, or they become confused about which medications to take. They are also at increased risk for potentially dangerous side effects from certain medications. Sedatives and certain other drugs (such as some antihistamines and antidepressants) may carry risk of increasing cognitive impairment.   It is important to develop a plan for medication monitoring and safety. People with dementia often need help taking their medications. It's a good idea to throw  away old pill bottles and other medications that are no longer needed.  Driving -- Driving is often one of the first safety issues that arises in people with Alzheimer disease. In people with Alzheimer disease, the risk of having a car accident is significantly increased, especially as the disease progresses. It is best to discuss the issue of driving early, before the symptoms become advanced. Over time, everyone living long enough with dementia will reach a point where driving is too dangerous. Losing the ability to drive can be hard to accept because it represents independence for many people. It can also be challenging if the person does not completely appreciate their impairments in mental functioning or reaction time. In particular, driving at night may carry extra risk. Many people with mild but worrisome impairments will insist that they can safely drive locally or in the daytime. That may be true for the present time; however, people may forget that they have agreed to limitations. In addition, their ability to drive safely, even with restrictions, will deteriorate over time. A roadside driving test is often recommended if there is disagreement or uncertainty about a person's ability to drive. However, if a person with newly diagnosed, mild Alzheimer disease is deemed still able to drive, they will need to be reassessed every six months, with the understanding that driving will eventually no longer be possible. There may be important insurance implications of continued driving when medical records document advice to stop driving.  Cooking -- Cooking is another area that can lead to serious safety concerns and may require help or supervision. Symptoms such as distractibility, forgetfulness, and difficulty following directions can lead to burns, fires, or other injuries. The use of gas cooking appliance raises a particular concern. A family member may have to ask the utility company to disconnect gas  stoves if there is potential for accident or injury. Newer induction electric stoves do not change color when on, and may carry an inadvertent burn risk if a person forgets what they are doing.  Wandering -- As dementia progresses, some people with Alzheimer disease begin to wander. Because restlessness, distractibility, and memory problems are common, a person who wanders may easily become lost. Identification bracelets can help ensure that a lost wanderer gets home. The [employer] provides a "Wandering Support" program that provides ID tags and 24-hour assistance to patients who are preregistered for this program. There are many "locator" applications that allow the person with dementia to wear or carry a GPS device that a family member can track with their cell phone. Regular exercise may decrease the restlessness that can lead to wandering. Exercise is also just good practice to maintain strength, good sleep, and overall health. If wandering continues, wearable alarm systems are available that alert caretakers when the person leaves the home.  Falls -- Falls with secondary injuries are one of the most important causes of additional disability in people with all types of dementia, including Alzheimer disease. Commonly used medications can increase risk of falls and injuries. Hip fracture is a particular concern in older people, as it can lead to serious complications and sometimes even death. To reduce the risk of falls, potential tripping hazards such as loose electrical cords, slippery rugs, and clutter should be removed. Inadvertent hoarding may develop and pose a safety risk. If the person lives   alone, family members or elder services should perform safety inspections of the living space periodically. Medication lists should also be reviewed with your doctor to identify those that might increase the risk of falling. Regular exercise, especially early in the course of dementia, and use of  assistive devices like canes can also help with balance.  DEMENTIA TREATMENT Medications for Alzheimer disease -- Many people with Alzheimer disease will have the option of trying a medication. A trial of medication is usually begun for a period of a few to several weeks while the person is monitored for side effects and response. A health care provider should periodically review all medications to see if they are providing any benefit. It is important to have realistic expectations about the potential benefits of medication therapy in Alzheimer disease. None of these medications cure the disease, and the reality is that over time the person will continue to worsen. When medication does have an effect, the goal is not to stop progression of the disease, but to improve quality of life for the person and their family to the extent possible.  Treatment to slow or delay progression -- There is currently no cure for Alzheimer disease. However, experts are studying treatments in the hope of finding a way to slow the progression of the mental and functional decline, along with scientific efforts to prevent or delay onset.  Treatment of memory problems -- There are several medicines currently available for treating the memory problems associated with Alzheimer disease; they are also used in people with other forms of dementia.  ?Donepezil (brand name: Aricept) This medications allow more of a chemical called acetylcholine to be active in the brain, making up for drops in acetylcholine levels that happen in Alzheimer disease. It can cause side effects such as nausea, vomiting, and diarrhea in some people. It also may cause weight loss in many people. When taken at bedtime, cholinesterase inhibitors can cause very vivid dreams. If the side effects are bothersome, the medication should be stopped.   Treatment of behavioral symptoms -- The behavioral symptoms of Alzheimer disease are often more troubling than the  cognitive (mental) symptoms. Even in mild cases, agitation, anxiety, and irritability can occur, and generally worsen as Alzheimer disease advances. This can be stressful for the person as well as for their family and caregivers. A combination of medications and behavioral therapy may be helpful. Non-medication therapies are preferred, as virtually all medications used for behavioral symptoms can increase confusion and many are associated with serious side effects and even an increased risk of death.  Depression -- Depression is common, especially in the early phases of dementia. It may be treated with behavioral therapy and/or with medications. The key is to recognize that depression may be playing a role in the person's symptoms. If depression is causing distress, it is worth treating. Potentially helpful medicines include a group of medicines known as selective serotonin reuptake inhibitors, or SSRIs, which are usually preferred over other choices in patients with dementia. Widely used SSRIs include fluoxetine (brand name: Prozac), sertraline (brand name: Zoloft), paroxetine (sample brand names: Brisdelle, Paxil), citalopram (brand name: Celexa), and escitalopram (brand names: Lexapro, Cipralex).   A variety of behavioral therapies are often helpful, do not have the side effects often seen with medications, and may be recommended for depression. Behavioral therapy involves changing the person's environment (eg, regular exercise, avoiding triggers that cause sadness, socializing with others, engaging in pleasant activities that a person enjoys).  Agitation and aggression --  One of the most difficult issues for caregivers and people with Alzheimer disease is aggressive behavior. Fortunately, this behavior is not common. However, many family members are reluctant to report aggressive behavior. In some cases, the behavior becomes physically abusive as dementia progresses. Agitation and aggression can be caused  by a number of factors, including: ?Confusion, misunderstanding, or disorientation (doctors use the term "delirium" as a general term to describe a state of confusion in which a person does not think or behave normally) ?Frightening or paranoid delusions or hallucinations ?Depression or anxiety ?Sleep disorders, such as reduced sleep or altered sleep/wake cycles ?Certain medical conditions that can cause delirium, such as urinary tract infection or pneumonia ?Being in physical pain or discomfort ?Side effects of certain medications Delusions (ie, believing something that is not real or true) are common in patients with dementia, occurring in up to 30 percent of those with advanced disease. Paranoid delusions are particularly distressing to both the patients and the caregivers: these often include beliefs that someone has invaded the house, that family members have been replaced by impostors, that spouses have been unfaithful, or that personal possessions have been stolen.   Family members should discuss any concerns about aggressive behavior with a health care provider and arrange for help if necessary. The best treatment for these symptoms depends upon what triggers them. As an example, a person who becomes aggressive during periods of confusion might best be treated by talking through the problem, while someone who becomes aggressive during delusions might require medication. Often, behavior improves once an underlying medical condition is treated. Caregivers can learn strategies to help lessen the number of triggers and confrontations.   Sleep problems -- Sleep disorders can be treated with either medicine or behavior changes or both: for example, limiting daytime naps, increasing physical activity, avoiding caffeine and alcohol in the evening. In some people, medication to help with sleep may be recommended, although these medications almost always have side effects (eg, worsened confusion and  increased risk of falls). Maintaining daily rhythms, using artificial lighting when needed during the day, and avoiding bright light exposures during the night may help maintain normal wake-sleep cycles.   COPING WITH DEMENTIA Being diagnosed with any form of dementia can be distressing and overwhelming for the person affected as well as their loved ones. For people with dementia -- It is important for people with early dementia to care for their physical and mental health. This means getting regular checkups, taking medicines if needed, eating a healthy diet, exercising regularly, getting enough sleep, and avoiding activities that may be risky. It is often helpful to talk to others through support groups or a counselor or social worker to discuss any feelings of anxiety, frustration, anger, loneliness, or depression. All of these feelings are normal, and dealing with these feelings can help you to feel more in control of your life and health. It can also help to talk to other people who are going through a similar experience. Another issue to consider is how to tell your family and friends about your diagnosis. Explaining the disease can help others to understand what to expect and how they can help, now and in the future. This can be especially helpful for children and grandchildren, who may not be familiar with the condition. While many people are able to live alone in the early stages of dementia, you may need help with tasks such as housekeeping, cooking, transportation, and paying bills. If possible, ask a friend or family  member for help making plans to deal with these and other issues as dementia progresses. Occupational therapists, and sometimes speech pathologists, can help to set up your home to minimize confusion and keep you independent for as long as possible. It's also important to establish Power of Attorney and Health Care Proxy statuses early, before a financial or health care crisis  happens. This involves completing paperwork to determine who can make decisions on your behalf if needed. In addition, you should discuss your preferences regarding issues that are likely to become important as your dementia worsens, including: ?Is health insurance available, and what does it cover? ?Where will I live? ?Who will make health care and end-of-life decisions if I can't make them for myself? ?Who will pay for care? A number of resources are available to assist in this type of planning.   For caregivers -- Dementia can also impose an enormous burden on families and other caregivers. People with dementia become less able to care for themselves as the condition progresses. If you are caring for someone with dementia, the following may help: ?Make a daily plan and prepare to be flexible if needed. ?Try to be patient when responding to repetitive questions, behaviors, or statements. ?Try not to argue or confront the person with dementia when they express mistaken ideas or facts. Change the subject or gently remind the person of an inaccuracy. Arguing or trying to convince a person of "the truth" is a natural reaction but it can be frustrating to all and can trigger unwanted behavior and feelings. ?Use memory aids such as writing out a list of daily activities, phone numbers, and instructions for usual tasks (ie, the telephone, microwave, etc). It may help if these are posted and easily visible so that the person need not remember to look for the aids. ?Establish calm and consistent nighttime routines to manage behavioral problems, which are often worst at night. Leave a night light on in the person's bedroom. ?Avoid major changes to the home environment (for example, rearranging furniture). ?Employ safety measures in the home, such as putting locks on medicine cabinets, keeping furniture in the same place to prevent falls, reducing clutter, removing electrical appliances from the bathroom,  installing grab bars in the bathroom, and setting the water heater below 120F. ?Help the person with personal care tasks as needed. It is not necessary to bathe every day, although a health care provider should be notified if the person develops sores in the mouth or genitals related to hygiene problems (eg, ill-fitting dentures, urine leakage). ?Speak slowly, present only one idea at a time, and be patient when waiting for responses. ?Encourage physical activity and exercise. Even a daily walk can help prevent physical decline and improve behavioral problems. ?Consider respite care. Respite care can provide a needed break and give you a chance to recharge. This is offered in many communities in the form of in-home care or adult day care. Caregiving can be an all-consuming experience, and it's essential to take time for yourself, take care of your own medical problems, and arrange for breaks when you need them. ?See if your area has a support group for people caring for loved ones with dementia. It can help to talk with other people who understand what you are going through.

## 2022-06-24 NOTE — Progress Notes (Addendum)
   CC:  dementia  Follow-up Visit  Last visit: 10/08/21  Brief HPI: 86 year old female with a history of HTN, aortic stenosis, CHF, CAD, afib on xarelto, OSA who follows in clinic for memory loss. MRI 08/18/21 showed an acute-subacute infarct in the left centrum semiovale, plus additional subacute-chronic centrum ovale infarcts bilaterally, a remote right parietal stroke, and multiple remote lacunar strokes.  At her last visit she was referred for neuropsychological testing.  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  Her memory has been slowly getting worse over time. Her husband notices this change more than she does. They are planning to travel to [place] later this year and are not sure when they will return. Husband notes that traveling has been more difficult for her lately.  Physical Exam:   Vital Signs: BP 131/67   Pulse 66   Wt 188 lb 2 oz (85.3 kg)   BMI 28.19 kg/m  GENERAL:  well appearing, in no acute distress, alert  SKIN:  Color, texture, turgor normal. No rashes or lesions HEAD:  Normocephalic/atraumatic. RESP: normal respiratory effort MSK:  No gross joint deformities.   NEUROLOGICAL: Mental Status: Alert, oriented to person, place and time, Follows commands, and Speech fluent and appropriate.     06/24/2022    3:24 PM 10/08/2021   10:09 AM  MMSE - Mini Mental State Exam  Orientation to time 0 3  Orientation to Place 4 3  Registration 3 3  Attention/ Calculation 1 1  Recall 1 1  Language- name 2 objects 2 2  Language- repeat 0 0  Language- follow 3 step command 3 3  Language- read & follow direction 1 1  Write a sentence 0 1  Copy design 1 1  Total score 16 19   Cranial Nerves: PERRL, face symmetric, no dysarthria, hearing grossly intact Motor: moves all extremities equally Gait: decreased stride length  IMPRESSION: 86 year old female with a  history of  HTN, aortic stenosis, CHF, CAD, afib on xarelto, OSA who presents for follow up of dementia. Neuropsychological testing was suggestive of mixed dementia from multifocal strokes and probable Alzheimer's disease. MMSE today is 16, lower than her previous score of 19. Will start donepezil for memory as there does appear to be an Alzheimer's component.  PLAN: -Start donepezil 5 mg daily -Will follow up when they return from [place]   Follow-up: PRN   I spent a total of 24 minutes on the date of the service. Discussed medication side effects, adverse reactions and drug interactions. Written educational materials and patient instructions outlining all of the above were given.  Genia Harold, MD 06/24/22 3:51 PM

## 2022-06-24 NOTE — Telephone Encounter (Signed)
-----   Message from Sueanne Margarita, MD sent at 06/24/2022  2:30 PM EDT ----- Her serum creatinine is back up to 1.11 and her creatinine clearance is less than 50 cc/min.  I think the safest bet for now is to stop her Xarelto and place her on Eliquis 5 mg twice daily.  Xarelto is much more dependent on her renal function which seems to be going back and forth.  I think it is can to be safer from a bleeding standpoint to have her on Eliquis.  Please let her PCP know that we are doing this

## 2022-06-25 NOTE — Telephone Encounter (Signed)
PLEASE ADVISE.

## 2022-06-28 ENCOUNTER — Other Ambulatory Visit: Payer: Self-pay

## 2022-06-28 DIAGNOSIS — C44719 Basal cell carcinoma of skin of left lower limb, including hip: Secondary | ICD-10-CM | POA: Diagnosis not present

## 2022-06-28 MED ORDER — AMOXICILLIN 500 MG PO CAPS: 1000.0000 mg | ORAL_CAPSULE | Freq: Three times a day (TID) | ORAL | 1 refills | Status: DC

## 2022-07-02 DIAGNOSIS — M810 Age-related osteoporosis without current pathological fracture: Secondary | ICD-10-CM | POA: Diagnosis not present

## 2022-07-02 DIAGNOSIS — Z780 Asymptomatic menopausal state: Secondary | ICD-10-CM | POA: Diagnosis not present

## 2022-07-05 ENCOUNTER — Telehealth (HOSPITAL_COMMUNITY): Payer: Self-pay | Admitting: *Deleted

## 2022-07-05 NOTE — Telephone Encounter (Signed)
Reaching out to patient to offer assistance regarding upcoming cardiac imaging study; pt's husband verbalizes understanding of appt date/time, parking situation and where to check in, and verified current allergies; name and call back number provided for further questions should they arise  Gordy Clement RN Navigator Cardiac Imaging [facility] [phone] office [phone] cell  Patient to take her daily medications. Her husband is aware she is to arrive at 2:30pm.

## 2022-07-06 ENCOUNTER — Ambulatory Visit (HOSPITAL_COMMUNITY)
Admission: RE | Admit: 2022-07-06 | Discharge: 2022-07-06 | Disposition: A | Discharge status: Home / Self Care | Payer: Medicare Other | Source: Ambulatory Visit | Attending: Nurse Practitioner | Admitting: Nurse Practitioner

## 2022-07-06 DIAGNOSIS — Z8679 Personal history of other diseases of the circulatory system: Secondary | ICD-10-CM | POA: Insufficient documentation

## 2022-07-06 DIAGNOSIS — Z953 Presence of xenogenic heart valve: Secondary | ICD-10-CM | POA: Diagnosis not present

## 2022-07-06 DIAGNOSIS — I350 Nonrheumatic aortic (valve) stenosis: Secondary | ICD-10-CM | POA: Insufficient documentation

## 2022-07-06 MED ORDER — IOHEXOL 350 MG/ML SOLN
100.0000 mL | Freq: Once | INTRAVENOUS | Status: AC | PRN
  Administered 2022-07-06: 100 mL via INTRAVENOUS

## 2022-07-13 ENCOUNTER — Ambulatory Visit: Payer: Medicare Other | Admitting: Infectious Diseases

## 2022-07-27 DIAGNOSIS — R7303 Prediabetes: Secondary | ICD-10-CM | POA: Diagnosis not present

## 2022-07-27 DIAGNOSIS — E038 Other specified hypothyroidism: Secondary | ICD-10-CM | POA: Diagnosis not present

## 2022-07-27 DIAGNOSIS — E782 Mixed hyperlipidemia: Secondary | ICD-10-CM | POA: Diagnosis not present

## 2022-07-27 DIAGNOSIS — Z79899 Other long term (current) drug therapy: Secondary | ICD-10-CM | POA: Diagnosis not present

## 2022-07-28 ENCOUNTER — Encounter: Payer: Self-pay | Admitting: Cardiology

## 2022-07-28 ENCOUNTER — Ambulatory Visit: Payer: Medicare Other | Attending: Cardiology | Admitting: Cardiology

## 2022-07-28 VITALS — BP 122/72 | HR 53 | Ht 68.5 in | Wt 181.4 lb

## 2022-07-28 DIAGNOSIS — I2510 Atherosclerotic heart disease of native coronary artery without angina pectoris: Secondary | ICD-10-CM

## 2022-07-28 DIAGNOSIS — G4733 Obstructive sleep apnea (adult) (pediatric): Secondary | ICD-10-CM | POA: Diagnosis not present

## 2022-07-28 DIAGNOSIS — I350 Nonrheumatic aortic (valve) stenosis: Secondary | ICD-10-CM

## 2022-07-28 DIAGNOSIS — I10 Essential (primary) hypertension: Secondary | ICD-10-CM

## 2022-07-28 DIAGNOSIS — I4821 Permanent atrial fibrillation: Secondary | ICD-10-CM | POA: Diagnosis not present

## 2022-07-28 DIAGNOSIS — E785 Hyperlipidemia, unspecified: Secondary | ICD-10-CM | POA: Diagnosis not present

## 2022-07-28 DIAGNOSIS — I5032 Chronic diastolic (congestive) heart failure: Secondary | ICD-10-CM | POA: Diagnosis not present

## 2022-07-28 DIAGNOSIS — I272 Pulmonary hypertension, unspecified: Secondary | ICD-10-CM | POA: Diagnosis not present

## 2022-07-28 NOTE — Progress Notes (Signed)
Cardiology Office Note:    Date:  07/28/2022   ID:  [name], DOB 07/14/1936, MRN [id number]  PCP:  Lujean Amel, MD  Cardiologist:  Fransico Him, MD    Referring MD: Lujean Amel, MD   Chief Complaint  Patient presents with   Coronary Artery Disease   Hypertension   Atrial Fibrillation   Sleep Apnea   Congestive Heart Failure   Hyperlipidemia   Aortic Stenosis    History of Present Illness:    [name] is a 86 y.o. female with a hx of chronic Afib (on Eliquis), HTN, chronic diastolic heart failure, aortic stenosis, HLD, obesity, GERD, and breast cancer.   She has a hx of CAD with cath in 2018 showing 70% focal ostial stenosis of the first diagonal branch of the left anterior descending coronary artery, 30-40% stenosis of the proximal right coronary artery, and otherwise very mild nonobstructive coronary artery disease. Medical management was recommended.  There was mild pulmonary hypertension.  She underwent TAVR with 76m Edwards Sapien 3 bioprosthesis 11/2016.  She also has OSA and is on CPAP.   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  Cardiac CT showed evidence of large vegetation and LAA thrombus.  It was found that she was not a candidate for SAVR and she would need chronic suppression with long-term antibiotic therapy per ID.  She is closely followed by infectious disease. Her follow-up echocardiogram on October 22, 2021 did not show any obvious vegetations.  LVEF was 60%.  Mild elevation in pulmonary artery systolic pressure.  Severe dilation of left atrial size and right atrial size.  Dilation of IVC, suggesting right atrial pressure of 15 mmHg.  Repeat cardiac CTA 06/2022 showed stable TAVR with no evidence of thrombus or vegetation on the aortic valve prosthesis.  There was no perivalvular AI.  She is here today for followup and is doing well.  She denies any chest pain or pressure,  SOB, DOE (except when walking too fast), PND, orthopnea,  dizziness, palpitations or syncope. She has been treated recently for what sounds to be BLE cellulitis by Dermatology. She is compliant with her meds and is tolerating meds with no SE.    She has not been using her CPAP device for a long period of time at night.  She wakes up in the middle of the night and goes out and sits in a chair because she cannot get back to sleep and does not put her PAP device back on.  She tolerates the mask and feels the pressure is adequate.  Since going on PAP she feels rested in the am and has no significant daytime sleepiness.  She denies any significant mouth or nasal dryness or nasal congestion.  She does not think that he snores.    Past Medical History:  Diagnosis Date   Anginal pain ([facility]    Aortic stenosis    severe AS with moderate AR by echo with normal LVF   Arthritis    CAD in native artery, non obstructive cath 11/2016.  12/16/2016   [facility]    left breast mastectomy(cancer)-surgery only   Chronic diastolic congestive heart failure (HCC)    Complication of anesthesia    extremely claustophobic(only occurs with narcotic medications as stated per pt)   Diverticulosis    Dysrhythmia    Afib   Fibromyalgia    GERD (gastroesophageal reflux disease)    Hard  of hearing    wears hearing aides    History of breast cancer no recurrence   dx 2004   s/p  left breast mastectomy w/ node dissection/   no chemo or radiation   History of TIA (transient ischemic attack)    per scan--  no residual   Hyperlipidemia    Hypertension    Nocturia    OSA on CPAP     severe/  AHI 34/hr   Permanent atrial fibrillation (HCC)    on Xarelto for CHADS2VASC score of 5   Pulmonary HTN ([place])    PASP 28mHg by echo 11/2016   Urge urinary incontinence    Wears glasses     Past Surgical History:  Procedure Laterality Date   ABDOMINOPLASTY  1[place]  BREAST SURGERY Left 2004   mastectomy     CARDIAC CATHETERIZATION N/A 11/19/2016   Procedure: Right/Left Heart Cath and Coronary Angiography;  Surgeon: TTroy Sine MD;  [facility];  Service: Cardiovascular;  Laterality: N/A;   CARPAL TUNNEL RELEASE Right 07-08-2004   COLONOSCOPY WITH ESOPHAGOGASTRODUODENOSCOPY (EGD)  2006   EXPLANTATION BREAST IMPLANT AND DEBRIDEMENT Left 09/15/2005   INTERSTIM IMPLANT PLACEMENT N/A 08/22/2014   Procedure: IBarrie LymeIMPLANT STAGE ONE/TWO;  Surgeon: SReece Packer MD;  [facility]  Service: Urology;  Laterality: N/A;   JOINT REPLACEMENT Left 2017   knee replacement   MASTECTOMY     MASTECTOMY, MODIFIED RADICAL W/RECONSTRUCTION  09-16-2003   left breast W/ SLN DISSECTION (pt states had 13 operations for implant infection    REPLACE TISSUE EXPANDER LEFT BREAST AND TOTAL CAPSULECTOMY  08-05-2006   REVERSE SHOULDER ARTHROPLASTY Left 10/26/2019   Procedure: REVERSE SHOULDER ARTHROPLASTY;  Surgeon: NNetta Cedars MD;  [facility];  Service: Orthopedics;  Laterality: Left;  with interscalene block   REVISION BREAST RECONSTRUCTION Left 11-11-2004   12-03-2004  DRAINAGE SEREMA LEFT CHEST   TEE WITHOUT CARDIOVERSION N/A 12/14/2016   Procedure: TRANSESOPHAGEAL ECHOCARDIOGRAM (TEE);  Surgeon: CBurnell Blanks MD;  [facility]  Service: Open Heart Surgery;  Laterality: N/A;   TEE WITHOUT CARDIOVERSION N/A 08/10/2021   Procedure: TRANSESOPHAGEAL ECHOCARDIOGRAM (TEE);  Surgeon: CBuford Dresser MD;  [facility];  Service: Cardiovascular;  Laterality: N/A;   TOTAL HIP ARTHROPLASTY Left 07/17/2013   Procedure: LEFT TOTAL HIP ARTHROPLASTY ANTERIOR APPROACH;  Surgeon: MMauri Pole MD;  [facility];  Service: Orthopedics;  Laterality: Left;   TOTAL HIP ARTHROPLASTY Right 01/28/2015   Procedure: RIGHT TOTAL HIP ARTHROPLASTY ANTERIOR APPROACH;  Surgeon: MParalee Cancel MD;  [facility];  Service: Orthopedics;  Laterality: Right;    TOTAL KNEE ARTHROPLASTY Left 11/11/2015   Procedure: LEFT TOTAL KNEE ARTHROPLASTY;  Surgeon: MParalee Cancel MD;  [facility];  Service: Orthopedics;  Laterality: Left;   TOTAL SHOULDER ARTHROPLASTY  08/11/2012   Procedure: TOTAL SHOULDER ARTHROPLASTY;  Surgeon: SAugustin Schooling MD;  [facility]  Service: Orthopedics;  Laterality: Right;  RIGHT  TOTAL SHOULDER  ARTHROPLASTY    TRANSCATHETER AORTIC VALVE REPLACEMENT, TRANSFEMORAL N/A 12/14/2016   Procedure: TRANSCATHETER AORTIC VALVE REPLACEMENT, TRANSFEMORAL;  Surgeon: CBurnell Blanks MD;  [facility]  Service: Open Heart Surgery;  Laterality: N/A;   TRANSTHORACIC ECHOCARDIOGRAM  02-25-2014   moderate LVH/  ef 55-60%/  mod. to sev. calcification AV with mild to moderate AV stenosis/  mild to mod. AR and MR/ mild PR/   moderate to severe LAE  TUBAL LIGATION  1973    Current Medications: Current Meds  Medication Sig   alendronate (FOSAMAX) 70 MG tablet Take 70 mg by mouth every Saturday.   amLODipine (NORVASC) 5 MG tablet Take 1 tablet by mouth daily.   amoxicillin (AMOXIL) 500 MG capsule Take 2 capsules (1,000 mg total) by mouth 3 (three) times daily.   apixaban (ELIQUIS) 5 MG TABS tablet Take 1 tablet (5 mg total) by mouth 2 (two) times daily.   atorvastatin (LIPITOR) 80 MG tablet Take 1 tablet (80 mg total) by mouth daily.   Cholecalciferol (VITAMIN D3) 50 MCG (2000 UT) capsule Take 2,000 Units by mouth daily.   DILT-XR 240 MG 24 hr capsule Take 1 capsule (240 mg total) by mouth every morning.   donepezil (ARICEPT) 5 MG tablet Take 1 tablet (5 mg total) by mouth at bedtime.   ezetimibe (ZETIA) 10 MG tablet Take 1 tablet (10 mg total) by mouth daily.   furosemide (LASIX) 20 MG tablet Take 1 tablet (20 mg total) by mouth daily.   hydrOXYzine (ATARAX/VISTARIL) 25 MG tablet Take 25 mg by mouth every 12 (twelve) hours as needed for anxiety.   losartan (COZAAR) 100 MG tablet Take 1 tablet (100 mg total) by mouth daily.    Melatonin 10 MG TABS Take 10 mg by mouth at bedtime as needed (for sleep).    Nutritional Supplements (VITAMIN D BOOSTER PO) Take 800 Units by mouth at bedtime. Kalcipos-d forte   omeprazole (PRILOSEC) 20 MG capsule Take 20 mg by mouth daily as needed (acid reflux/indigestion.).    predniSONE (DELTASONE) 5 MG tablet Take 5 mg by mouth daily with breakfast.   triamcinolone ointment (KENALOG) 0.1 % Apply 1 application topically 2 (two) times daily as needed (affected areas of feet).      Allergies:   Beta adrenergic blockers, Other, Oxycodone, Sotalol hcl, Tramadol, Clonidine derivatives, Crestor [rosuvastatin calcium], Latex, Rosuvastatin calcium, Rosuvastatin, and Tape   Social History   Socioeconomic History   Marital status: Married    Spouse name: Pilar Plate   Number of children: 5   Years of education: Not on file   Highest education level: Associate degree: academic program  Occupational History   Not on file  Tobacco Use   Smoking status: Former    Packs/day: 0.25    Years: 8.00    Total pack years: 2.00    Types: Cigarettes    Quit date: 10/18/1966    Years since quitting: 55.8   Smokeless tobacco: Never  Vaping Use   Vaping Use: Never used  Substance and Sexual Activity   Alcohol use: No   Drug use: No   Sexual activity: Yes  Other Topics Concern   Not on file  Social History Narrative   10/08/21 lives with husband   Social Determinants of Health   Financial Resource Strain: Not on file  Food Insecurity: Not on file  Transportation Needs: Not on file  Physical Activity: Not on file  Stress: Not on file  Social Connections: Not on file     Family History: The patient's family history includes Heart disease in her father and mother.  ROS:   Please see the history of present illness.    ROS  All other systems reviewed and negative.   EKGs/Labs/Other Studies Reviewed:    The following studies were reviewed today: PAP compliance downlad  EKG:  EKG is not  ordered today   Recent Labs: 08/04/2021: B Natriuretic Peptide 474.6 08/10/2021: Magnesium 1.6 05/12/2022:  ALT 11 06/23/2022: BUN 25; Creatinine, Ser 1.11; Hemoglobin 11.4; Platelets 189; Potassium 4.8; Sodium 145   Recent Lipid Panel    Component Value Date/Time   CHOL 164 05/24/2022 1207   TRIG 52 05/24/2022 1207   HDL 99 05/24/2022 1207   CHOLHDL 1.7 05/24/2022 1207   LDLCALC 54 05/24/2022 1207    Physical Exam:    VS:  BP 122/72   Pulse (!) 53   Ht 5' 8.5" (1.74 m)   Wt 181 lb 6.4 oz (82.3 kg)   SpO2 97%   BMI 27.18 kg/m     Wt Readings from Last 3 Encounters:  07/28/22 181 lb 6.4 oz (82.3 kg)  06/24/22 188 lb 2 oz (85.3 kg)  05/24/22 171 lb (77.6 kg)     GEN: Well nourished, well developed in no acute distress HEENT: Normal NECK: No JVD; No carotid bruits LYMPHATICS: No lymphadenopathy CARDIAC:irregularly irregular, no  rubs, gallops.  1/6 SM at RUSB to LLSB RESPIRATORY:  Clear to auscultation without rales, wheezing or rhonchi  ABDOMEN: Soft, non-tender, non-distended MUSCULOSKELETAL:  mildly erythematous BLE trace edema; No deformity  SKIN: Warm and dry NEUROLOGIC:  Alert and oriented x 3 PSYCHIATRIC:  Normal affect   ASSESSMENT:    1. Severe aortic stenosis   2. Coronary artery disease involving native heart without angina pectoris, unspecified vessel or lesion type   3. Chronic diastolic congestive heart failure ([place])   4. Hyperlipidemia LDL goal <70   5. Essential hypertension, benign   6. OSA on CPAP   7. Permanent atrial fibrillation ([place])   8. Pulmonary HTN ([place])    PLAN:    In order of problems listed above:  1.  Aortic stenosis /TAVR/History of TAVR endocarditis -s/p TAVR with 91m Edwards Sapien 3 bioprosthesis 11/2016.   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  Cardiac CT showed evidence of large vegetation and LAA thrombus. Neemed not a candidate for SAVR and she would need  chronic suppression with long-term antibiotic therapy per ID.   -She is closely followed by infectious disease. -2D echo 8/23 showed EF 65 to 70% with stable 26 mm TAVR with mean gradient 7.5 mmHg -Coronary CTA 06/23/1922 showed normal 26 mm SAPIEN TAVR with freely mobile leaflets without any thrombus or vegetation and no paravalvular leak.   -Continue amoxicillin 1000 mg by mouth 3 times daily  2.  ASCAD  -cath 2018 with  70% focal ostial stenosis of the first diagonal branch of the left anterior descending coronary artery, 30-40% stenosis of the proximal right coronary artery, and otherwise very mild nonobstructive coronary artery disease.   -She denies any anginal symptoms -Continue prescription drug management with atorvastatin 80 mg daily and Zetia 10 mg daily.  Refills -no ASA due to DOAC for afib.   3.  Chronic diastolic CHF  -She is euvolemic on exam today -weight is stable -Continue prescription drug management Lasix 20 mg daily with as needed refills   4.   Hyperlipidemia  -LDL goal < 70 -I have personally reviewed and interpreted outside labs performed by patient's PCP which showed LDL 54 and HDL 99 on 05/24/2022 -Continue prescription drug management atorvastatin 80 mg daily and Zetia 10 mg daily with as needed refills  5.  Hypertension  -He is controlled on exam today -Continue prescription drug management with losartan 100 mg daily, amlodipine 5 mg daily and Cardizem CD 240 mg daily with PRN Refills -She is  on 2 CCB for BP control but has multiple med intolerances including BB and her BP is controlled so will not make any changes   6. OSA - The patient is tolerating PAP therapy well without any problems. The PAP download performed by his DME was personally reviewed and interpreted by me today and showed an AHI of 1.9 /hr on 8 cm H2O with 20% compliance in using more than 4 hours nightly.  The patient has been using and benefiting from PAP use and will continue to benefit from  therapy.  -I encouraged her to be more compliant with her device  7.  Chronic atrial fibrillation  -Heart rate is adequately controlled and she denies any palpitations -denies any bleeding problems on Xarelto -Prescription drug management Cardizem CD 300 mg daily and Eliquis 5 mg twice daily -I have personally reviewed and interpreted outside labs performed by patient's PCP which showed serum creatinine 1.1, potassium 4.8 and hemoglobin 11.4 on 06/23/2022   8.  Pulmonary HTN  -Moderate by echo 01/2018 with PASP 34mHg -repeat 2D echo 08/2019 with PASP 377mg -2D echo 05/2022 showed moderately dilated RV with mild RV dysfunction but no pulmonary hypertension     Medication Adjustments/Labs and Tests Ordered: Current medicines are reviewed at length with the patient today.  Concerns regarding medicines are outlined above.  No orders of the defined types were placed in this encounter.  No orders of the defined types were placed in this encounter.   Signed, TrFransico HimMD  07/28/2022 9:41 AM    [place]

## 2022-07-28 NOTE — Patient Instructions (Signed)
Medication Instructions:  Your physician recommends that you continue on your current medications as directed. Please refer to the Current Medication list given to you today.  *If you need a refill on your cardiac medications before your next appointment, please call your pharmacy*   Follow-Up: At [facility], you and your health needs are our priority.  As part of our continuing mission to provide you with exceptional heart care, we have created designated Provider Care Teams.  These Care Teams include your primary Cardiologist (physician) and Advanced Practice Providers (APPs -  Physician Assistants and Nurse Practitioners) who all work together to provide you with the care you need, when you need it.  Your next appointment:   Follow up with Dr. Radford Pax as needed.

## 2022-08-02 DIAGNOSIS — I2510 Atherosclerotic heart disease of native coronary artery without angina pectoris: Secondary | ICD-10-CM | POA: Diagnosis not present

## 2022-08-02 DIAGNOSIS — I4891 Unspecified atrial fibrillation: Secondary | ICD-10-CM | POA: Diagnosis not present

## 2022-08-02 DIAGNOSIS — F03B0 Unspecified dementia, moderate, without behavioral disturbance, psychotic disturbance, mood disturbance, and anxiety: Secondary | ICD-10-CM | POA: Diagnosis not present

## 2022-08-02 DIAGNOSIS — I10 Essential (primary) hypertension: Secondary | ICD-10-CM | POA: Diagnosis not present

## 2022-08-02 DIAGNOSIS — I272 Pulmonary hypertension, unspecified: Secondary | ICD-10-CM | POA: Diagnosis not present

## 2022-08-02 DIAGNOSIS — D6869 Other thrombophilia: Secondary | ICD-10-CM | POA: Diagnosis not present

## 2022-08-02 DIAGNOSIS — Z0001 Encounter for general adult medical examination with abnormal findings: Secondary | ICD-10-CM | POA: Diagnosis not present

## 2022-08-02 DIAGNOSIS — Z23 Encounter for immunization: Secondary | ICD-10-CM | POA: Diagnosis not present

## 2022-08-02 DIAGNOSIS — I700 Atherosclerosis of aorta: Secondary | ICD-10-CM | POA: Diagnosis not present

## 2022-08-02 DIAGNOSIS — M810 Age-related osteoporosis without current pathological fracture: Secondary | ICD-10-CM | POA: Diagnosis not present

## 2022-08-02 DIAGNOSIS — E038 Other specified hypothyroidism: Secondary | ICD-10-CM | POA: Diagnosis not present

## 2022-08-02 DIAGNOSIS — I350 Nonrheumatic aortic (valve) stenosis: Secondary | ICD-10-CM | POA: Diagnosis not present

## 2022-08-06 DIAGNOSIS — I8312 Varicose veins of left lower extremity with inflammation: Secondary | ICD-10-CM | POA: Diagnosis not present

## 2022-08-06 DIAGNOSIS — Z85828 Personal history of other malignant neoplasm of skin: Secondary | ICD-10-CM | POA: Diagnosis not present

## 2022-08-06 DIAGNOSIS — I8311 Varicose veins of right lower extremity with inflammation: Secondary | ICD-10-CM | POA: Diagnosis not present

## 2022-08-06 DIAGNOSIS — I872 Venous insufficiency (chronic) (peripheral): Secondary | ICD-10-CM | POA: Diagnosis not present

## 2022-08-12 ENCOUNTER — Other Ambulatory Visit: Payer: Self-pay

## 2022-08-12 ENCOUNTER — Ambulatory Visit (INDEPENDENT_AMBULATORY_CARE_PROVIDER_SITE_OTHER): Payer: Medicare Other | Admitting: Infectious Diseases

## 2022-08-12 VITALS — BP 133/62 | HR 66 | Temp 97.6°F | Wt 181.4 lb

## 2022-08-12 DIAGNOSIS — Z5181 Encounter for therapeutic drug level monitoring: Secondary | ICD-10-CM | POA: Diagnosis not present

## 2022-08-12 DIAGNOSIS — I38 Endocarditis, valve unspecified: Secondary | ICD-10-CM | POA: Diagnosis not present

## 2022-08-12 DIAGNOSIS — T826XXD Infection and inflammatory reaction due to cardiac valve prosthesis, subsequent encounter: Secondary | ICD-10-CM | POA: Diagnosis not present

## 2022-08-12 MED ORDER — AMOXICILLIN 500 MG PO CAPS: 1000.0000 mg | ORAL_CAPSULE | Freq: Three times a day (TID) | ORAL | 3 refills | Status: AC

## 2022-08-12 NOTE — Progress Notes (Signed)
[facility]                                                             [place], [place], [place], [address]                                                                  Phn. 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 Otherwise, needs to fu with ID provider at [place] for continuity of care.  Fu with Cardiology as instructed   All questions and concerns were discussed and addressed. Patient verbalized understanding of the plan. ____________________________________________________________________________________________________________________ Subjective/Interval Events 11/05/21 Here for follow up for PV endocarditis. Accompanied by her husband. Taking amoxicillin 1g three times a day. Denies any issues with antibiotics like fevers, chills, sweats. Denies nausea, vomiting, abdominal pain and diarrhea. They are going back to [place] in 5 days permanently per husband. Discussed with them need to continue PO amoxicillin for suppression indefinitely and they verbalised understanding. Discussed with them to follow up with an ID provider to monitor while on antibiotics as well as need for labs when they move to [place].   Seen by Neurology and was started on donepezil for concerns of Alzheime's along with vascular dementia. Last seen by Cardiology 07/28/22. TTE 8/23 and Coronart CT 9/19 findings noted as below.   ROS:  All other systems reviewed and are negative except as above  Past  Medical History:  Diagnosis Date   Anginal pain (HCC)    Aortic stenosis    severe AS with moderate AR by echo with normal LVF   Arthritis    CAD in native artery, non obstructive cath 11/2016.  12/16/2016   Cancer ([place])    left breast mastectomy(cancer)-surgery only   Chronic diastolic congestive heart failure ([place])    Complication of anesthesia    extremely claustophobic(only occurs with narcotic medications as stated per pt)   Diverticulosis    Dysrhythmia    Afib   Fibromyalgia    GERD (gastroesophageal reflux disease)    Hard of hearing    wears hearing aides    History of breast cancer no recurrence   dx 2004   s/p  left breast mastectomy w/ node dissection/   no  chemo or radiation   History of TIA (transient ischemic attack)    per scan--  no residual   Hyperlipidemia    Hypertension    Nocturia    OSA on CPAP     severe/  AHI 34/hr   Permanent atrial fibrillation (HCC)    on Xarelto for CHADS2VASC score of 5   Pulmonary HTN ([place])    PASP 46mHg by echo 11/2016   Urge urinary incontinence    Wears glasses    Past Surgical History:  Procedure Laterality Date   ABDOMINOPLASTY  [place] 2004   mastectomy    CARDIAC CATHETERIZATION N/A 11/19/2016   Procedure: Right/Left Heart Cath and Coronary Angiography;  Surgeon: TTroy Sine MD;  [facility];  Service: Cardiovascular;  Laterality: N/A;   CARPAL TUNNEL RELEASE Right 07-08-2004   COLONOSCOPY WITH ESOPHAGOGASTRODUODENOSCOPY (EGD)  2006   EXPLANTATION BREAST IMPLANT AND DEBRIDEMENT Left 09/15/2005   INTERSTIM IMPLANT PLACEMENT N/A 08/22/2014   Procedure: IBarrie LymeIMPLANT STAGE ONE/TWO;  Surgeon: SReece Packer MD;  [facility]  Service: Urology;  Laterality: N/A;   JOINT REPLACEMENT Left 2017   knee replacement   MASTECTOMY     MASTECTOMY, MODIFIED RADICAL W/RECONSTRUCTION  09-16-2003   left breast W/ SLN DISSECTION (pt states  had 13 operations for implant infection    REPLACE TISSUE EXPANDER LEFT BREAST AND TOTAL CAPSULECTOMY  08-05-2006   REVERSE SHOULDER ARTHROPLASTY Left 10/26/2019   Procedure: REVERSE SHOULDER ARTHROPLASTY;  Surgeon: NNetta Cedars MD;  [facility];  Service: Orthopedics;  Laterality: Left;  with interscalene block   REVISION BREAST RECONSTRUCTION Left 11-11-2004   12-03-2004  DRAINAGE SEREMA LEFT CHEST   TEE WITHOUT CARDIOVERSION N/A 12/14/2016   Procedure: TRANSESOPHAGEAL ECHOCARDIOGRAM (TEE);  Surgeon: CBurnell Blanks MD;  [facility]  Service: Open Heart Surgery;  Laterality: N/A;   TEE WITHOUT CARDIOVERSION N/A 08/10/2021   Procedure: TRANSESOPHAGEAL ECHOCARDIOGRAM (TEE);  Surgeon: CBuford Dresser MD;  [facility];  Service: Cardiovascular;  Laterality: N/A;   TOTAL HIP ARTHROPLASTY Left 07/17/2013   Procedure: LEFT TOTAL HIP ARTHROPLASTY ANTERIOR APPROACH;  Surgeon: MMauri Pole MD;  [facility];  Service: Orthopedics;  Laterality: Left;   TOTAL HIP ARTHROPLASTY Right 01/28/2015   Procedure: RIGHT TOTAL HIP ARTHROPLASTY ANTERIOR APPROACH;  Surgeon: MParalee Cancel MD;  [facility];  Service: Orthopedics;  Laterality: Right;   TOTAL KNEE ARTHROPLASTY Left 11/11/2015   Procedure: LEFT TOTAL KNEE ARTHROPLASTY;  Surgeon: MParalee Cancel MD;  [facility];  Service: Orthopedics;  Laterality: Left;   TOTAL SHOULDER ARTHROPLASTY  08/11/2012   Procedure: TOTAL SHOULDER ARTHROPLASTY;  Surgeon: SAugustin Schooling MD;  [facility]  Service: Orthopedics;  Laterality: Right;  RIGHT  TOTAL SHOULDER  ARTHROPLASTY    TRANSCATHETER AORTIC VALVE REPLACEMENT, TRANSFEMORAL N/A 12/14/2016   Procedure: TRANSCATHETER AORTIC VALVE REPLACEMENT, TRANSFEMORAL;  Surgeon: CBurnell Blanks MD;  [facility]  Service: Open Heart Surgery;  Laterality: N/A;   TRANSTHORACIC ECHOCARDIOGRAM  02-25-2014   moderate LVH/  ef 55-60%/  mod. to sev. calcification AV with mild  to moderate AV stenosis/  mild to mod. AR and MR/ mild PR/   moderate to severe LAE   TUBAL LIGATION  1973   Allergies  Allergen Reactions   Beta Adrenergic Blockers Shortness Of Breath   Other     Narcotic pain medications - anxiety attacks  Oxycodone Shortness Of Breath and Other (See Comments)    Tolerates Dilaudid, Fentanyl   Sotalol Hcl Shortness Of Breath and Other (See Comments)   Tramadol Anxiety and Other (See Comments)    Can take if absolutely necessary   Clonidine Derivatives Palpitations    bradycardia   Crestor [Rosuvastatin Calcium] Other (See Comments)    MYALGIAS   Latex Rash    Per patient-bandaids- no problems with gloves   Rosuvastatin Calcium Nausea Only   Rosuvastatin    Tape Rash     bandaids    Social History   Socioeconomic History   Marital status: Married    Spouse name: Pilar Plate   Number of children: 5   Years of education: Not on file   Highest education level: Associate degree: academic program  Occupational History   Not on file  Tobacco Use   Smoking status: Former    Packs/day: 0.25    Years: 8.00    Total pack years: 2.00    Types: Cigarettes    Quit date: 10/18/1966    Years since quitting: 55.8   Smokeless tobacco: Never  Vaping Use   Vaping Use: Never used  Substance and Sexual Activity   Alcohol use: No   Drug use: No   Sexual activity: Yes  Other Topics Concern   Not on file  Social History Narrative   10/08/21 lives with husband   Social Determinants of Health   Financial Resource Strain: Not on file  Food Insecurity: Not on file  Transportation Needs: Not on file  Physical Activity: Not on file  Stress: Not on file  Social Connections: Not on file  Intimate Partner Violence: Not on file   Family History  Problem Relation Age of Onset   Heart disease Mother    Heart disease Father    Vitals BP 133/62   Pulse 66   Temp 97.6 F (36.4 C) (Oral)   Wt 181 lb 6.4 oz (82.3 kg)   SpO2 99%   BMI 27.18 kg/m    Examination  General - not in acute distress, comfortably sitting in chair HEENT - PEERLA, no pallor and no icterus Chest - b/l clear air entry, no additional sounds CVS- Normal D7A1, systolic murmur+ Abdomen - Soft, Non tender , non distended Ext- no pedal edema Neuro: grossly normal Psych : calm and cooperative   Recent labs    Latest Ref Rng & Units 06/23/2022    2:18 PM 05/12/2022   10:56 AM 09/25/2021   10:41 AM  CBC  WBC 3.4 - 10.8 x10E3/uL 7.3  7.0  8.7   Hemoglobin 11.1 - 15.9 g/dL 11.4  12.7  9.6   Hematocrit 34.0 - 46.6 % 34.8  38.9  31.6   Platelets 150 - 450 x10E3/uL 189  204  248       Latest Ref Rng & Units 06/23/2022    2:18 PM 05/27/2022    9:17 AM 05/24/2022   12:07 PM  CMP  Glucose 70 - 99 mg/dL 112  99  110   BUN 8 - 27 mg/dL '25  30  25   '$ Creatinine 0.57 - 1.00 mg/dL 1.11  1.05  0.99   Sodium 134 - 144 mmol/L 145  141  144   Potassium 3.5 - 5.2 mmol/L 4.8  3.8  5.4   Chloride 96 - 106 mmol/L 108  105  105   CO2 20 - 29 mmol/L '19  25  22   '$ Calcium 8.7 -  10.3 mg/dL 9.7  9.6  10.6      Pertinent Microbiology Results for orders placed or performed in visit on 09/04/21  SARS Coronavirus 2 (TAT 6-24 hrs)     Status: None   Collection Time: 09/04/21 12:00 AM  Result Value Ref Range Status   SARS Coronavirus 2 RESULT: NEGATIVE  Final    Comment: RESULT: NEGATIVESARS-CoV-2 INTERPRETATION:A NEGATIVE  test result means that SARS-CoV-2 RNA was not present in the specimen above the limit of detection of this test. This does not preclude a possible SARS-CoV-2 infection and should not be used as the  sole basis for patient management decisions. Negative results must be combined with clinical observations, patient history, and epidemiological information. Optimum specimen types and timing for peak viral levels during infections caused by SARS-CoV-2  have not been determined. Collection of multiple specimens or types of specimens may be necessary to detect virus. Improper  specimen collection and handling, sequence variability under primers/probes, or organism present below the limit of detection may  lead to false negative results. Positive and negative predictive values of testing are highly dependent on prevalence. False negative test results are more likely when prevalence of disease is high.The expected result is NEGATIVE.Fact S heet for  Healthcare Providers: LocalChronicle.no Sheet for Patients: SalonLookup.es Reference Range - Negative    Pertinent Imaging 9/20 CT coronary morph IMPRESSION: 1. 26 mm S3 prosthetic valve is present in the aortic position with normal appearance and no evidence of vegetation.   2. Filling defect in the LAA which may represent mixing artifact versus thrombus.   3. Severe biatrial enlargement.   4. 3-vessel coronary calcifications.  8/23 TTE  IMPRESSIONS Left ventricular ejection fraction, by estimation, is 65 to 70%. The left ventricle has normal function. The left ventricle has no regional wall motion abnormalities. There is mild left ventricular hypertrophy. Left ventricular diastolic function could not be evaluated. 1.Right ventricular systolic function is mildly reduced. The right ventricular size is moderately enlarged. There is normal pulmonary artery systolic pressure. 3. Left atrial size was severely dilated. 4. Right atrial size was severely dilated. 5. The mitral valve is normal in structure. Trivial mitral valve regurgitation. The aortic valve has been repaired/replaced. Aortic valve regurgitation is not visualized. There is a 26 mm Sapien prosthetic (TAVR) valve present in the aortic position. Procedure Date: 12/14/2016. Aortic valve mean gradient measures 7.5 mmHg. Aortic valve Vmax measures 1.98 m/s. Aortic valve acceleration time measures 74 msec. 6. The inferior vena cava is normal in size with greater than 50% respiratory  variability, suggesting right atrial pressure of 3 mmHg.  I have spent a total of 40  minutes of face-to-face and non-face-to-face time, including clinical staff time, preparing to see patient, ordering tests and/or medications, and provide counseling the patient.   Electronically signed by:  Rosiland Oz, MD Infectious Disease Physician [facility] [address] [place], [place] [address] Phone: [phone]  Fax: [phone]

## 2022-08-13 LAB — SEDIMENTATION RATE: Sed Rate: 11 mm/h (ref 0–30)

## 2022-08-13 LAB — CBC
HCT: 39 % (ref 35.0–45.0)
Hemoglobin: 12.8 g/dL (ref 11.7–15.5)
MCH: 29.4 pg (ref 27.0–33.0)
MCHC: 32.8 g/dL (ref 32.0–36.0)
MCV: 89.4 fL (ref 80.0–100.0)
MPV: 9.6 fL (ref 7.5–12.5)
Platelets: 211 10*3/uL (ref 140–400)
RBC: 4.36 10*6/uL (ref 3.80–5.10)
RDW: 13.1 % (ref 11.0–15.0)
WBC: 8.3 10*3/uL (ref 3.8–10.8)

## 2022-08-13 LAB — BASIC METABOLIC PANEL
BUN/Creatinine Ratio: 32 (calc) — ABNORMAL HIGH (ref 6–22)
BUN: 37 mg/dL — ABNORMAL HIGH (ref 7–25)
CO2: 25 mmol/L (ref 20–32)
Calcium: 10.5 mg/dL — ABNORMAL HIGH (ref 8.6–10.4)
Chloride: 105 mmol/L (ref 98–110)
Creat: 1.15 mg/dL — ABNORMAL HIGH (ref 0.60–0.95)
Glucose, Bld: 80 mg/dL (ref 65–99)
Potassium: 4 mmol/L (ref 3.5–5.3)
Sodium: 143 mmol/L (ref 135–146)

## 2022-08-13 LAB — C-REACTIVE PROTEIN: CRP: 2.1 mg/L (ref <8.0)

## 2022-08-19 ENCOUNTER — Ambulatory Visit: Payer: Medicare Other | Admitting: Infectious Diseases

## 2022-09-24 ENCOUNTER — Other Ambulatory Visit: Payer: Self-pay | Admitting: Psychiatry
